# Patient Record
Sex: Female | Born: 1960 | Race: White | Hispanic: No | Smoking: Current some day smoker
Health system: Southern US, Community
[De-identification: ages and names within clinical notes are randomized; demographics above are authoritative.]

## PROBLEM LIST (undated history)

## (undated) DIAGNOSIS — K7290 Hepatic failure, unspecified without coma: Secondary | ICD-10-CM

## (undated) DIAGNOSIS — T7840XA Allergy, unspecified, initial encounter: Secondary | ICD-10-CM

## (undated) DIAGNOSIS — K7030 Alcoholic cirrhosis of liver without ascites: Secondary | ICD-10-CM

## (undated) DIAGNOSIS — J449 Chronic obstructive pulmonary disease, unspecified: Secondary | ICD-10-CM

## (undated) DIAGNOSIS — K760 Fatty (change of) liver, not elsewhere classified: Secondary | ICD-10-CM

## (undated) DIAGNOSIS — C801 Malignant (primary) neoplasm, unspecified: Secondary | ICD-10-CM

## (undated) DIAGNOSIS — R002 Palpitations: Secondary | ICD-10-CM

## (undated) DIAGNOSIS — T50905A Adverse effect of unspecified drugs, medicaments and biological substances, initial encounter: Secondary | ICD-10-CM

## (undated) DIAGNOSIS — K766 Portal hypertension: Secondary | ICD-10-CM

## (undated) DIAGNOSIS — K2970 Gastritis, unspecified, without bleeding: Secondary | ICD-10-CM

## (undated) DIAGNOSIS — E669 Obesity, unspecified: Secondary | ICD-10-CM

## (undated) DIAGNOSIS — I8500 Esophageal varices without bleeding: Secondary | ICD-10-CM

## (undated) DIAGNOSIS — K219 Gastro-esophageal reflux disease without esophagitis: Secondary | ICD-10-CM

## (undated) DIAGNOSIS — I10 Essential (primary) hypertension: Secondary | ICD-10-CM

## (undated) DIAGNOSIS — M1990 Unspecified osteoarthritis, unspecified site: Secondary | ICD-10-CM

## (undated) DIAGNOSIS — D126 Benign neoplasm of colon, unspecified: Secondary | ICD-10-CM

## (undated) DIAGNOSIS — R161 Splenomegaly, not elsewhere classified: Secondary | ICD-10-CM

## (undated) DIAGNOSIS — F32A Depression, unspecified: Secondary | ICD-10-CM

## (undated) DIAGNOSIS — R441 Visual hallucinations: Secondary | ICD-10-CM

## (undated) DIAGNOSIS — F419 Anxiety disorder, unspecified: Secondary | ICD-10-CM

## (undated) DIAGNOSIS — R0602 Shortness of breath: Secondary | ICD-10-CM

## (undated) DIAGNOSIS — F329 Major depressive disorder, single episode, unspecified: Secondary | ICD-10-CM

## (undated) DIAGNOSIS — B1920 Unspecified viral hepatitis C without hepatic coma: Secondary | ICD-10-CM

## (undated) HISTORY — DX: Gastro-esophageal reflux disease without esophagitis: K21.9

## (undated) HISTORY — DX: Alcoholic cirrhosis of liver without ascites: K70.30

## (undated) HISTORY — DX: Portal hypertension: K76.6

## (undated) HISTORY — DX: Obesity, unspecified: E66.9

## (undated) HISTORY — DX: Gastritis, unspecified, without bleeding: K29.70

## (undated) HISTORY — DX: Allergy, unspecified, initial encounter: T78.40XA

## (undated) HISTORY — DX: Fatty (change of) liver, not elsewhere classified: K76.0

## (undated) HISTORY — DX: Esophageal varices without bleeding: I85.00

## (undated) HISTORY — DX: Benign neoplasm of colon, unspecified: D12.6

## (undated) HISTORY — DX: Unspecified osteoarthritis, unspecified site: M19.90

## (undated) HISTORY — DX: Splenomegaly, not elsewhere classified: R16.1

## (undated) HISTORY — PX: OTHER SURGICAL HISTORY: SHX169

## (undated) HISTORY — DX: Essential (primary) hypertension: I10

---

## 1980-10-28 HISTORY — PX: TUBAL LIGATION: SHX77

## 1997-10-28 DIAGNOSIS — C801 Malignant (primary) neoplasm, unspecified: Secondary | ICD-10-CM

## 1997-10-28 HISTORY — DX: Malignant (primary) neoplasm, unspecified: C80.1

## 1997-10-28 HISTORY — PX: ABDOMINAL HYSTERECTOMY: SHX81

## 1999-05-08 ENCOUNTER — Encounter: Payer: Self-pay | Admitting: Family Medicine

## 1999-05-08 ENCOUNTER — Encounter (INDEPENDENT_AMBULATORY_CARE_PROVIDER_SITE_OTHER): Payer: Self-pay | Admitting: *Deleted

## 1999-05-08 ENCOUNTER — Ambulatory Visit (HOSPITAL_COMMUNITY): Admission: RE | Admit: 1999-05-08 | Discharge: 1999-05-08 | Payer: Self-pay | Admitting: Family Medicine

## 1999-05-10 ENCOUNTER — Other Ambulatory Visit: Admission: RE | Admit: 1999-05-10 | Discharge: 1999-05-10 | Payer: Self-pay | Admitting: Obstetrics & Gynecology

## 1999-05-10 ENCOUNTER — Encounter: Admission: RE | Admit: 1999-05-10 | Discharge: 1999-05-10 | Payer: Self-pay | Admitting: Obstetrics

## 1999-06-21 ENCOUNTER — Encounter: Admission: RE | Admit: 1999-06-21 | Discharge: 1999-06-21 | Payer: Self-pay | Admitting: Obstetrics

## 1999-08-13 ENCOUNTER — Ambulatory Visit (HOSPITAL_COMMUNITY): Admission: RE | Admit: 1999-08-13 | Discharge: 1999-08-13 | Payer: Self-pay | Admitting: Obstetrics

## 1999-08-13 ENCOUNTER — Encounter (INDEPENDENT_AMBULATORY_CARE_PROVIDER_SITE_OTHER): Payer: Self-pay

## 1999-08-30 ENCOUNTER — Encounter: Admission: RE | Admit: 1999-08-30 | Discharge: 1999-08-30 | Payer: Self-pay | Admitting: Obstetrics

## 1999-09-11 ENCOUNTER — Ambulatory Visit: Admission: RE | Admit: 1999-09-11 | Discharge: 1999-09-11 | Payer: Self-pay | Admitting: Gynecology

## 1999-09-27 ENCOUNTER — Encounter: Payer: Self-pay | Admitting: Gynecology

## 1999-10-02 ENCOUNTER — Encounter (INDEPENDENT_AMBULATORY_CARE_PROVIDER_SITE_OTHER): Payer: Self-pay | Admitting: Specialist

## 1999-10-02 ENCOUNTER — Inpatient Hospital Stay (HOSPITAL_COMMUNITY): Admission: RE | Admit: 1999-10-02 | Discharge: 1999-10-09 | Payer: Self-pay | Admitting: Gynecology

## 1999-10-30 ENCOUNTER — Ambulatory Visit: Admission: RE | Admit: 1999-10-30 | Discharge: 1999-10-30 | Payer: Self-pay | Admitting: Gynecology

## 1999-11-21 ENCOUNTER — Ambulatory Visit: Admission: RE | Admit: 1999-11-21 | Discharge: 1999-11-21 | Payer: Self-pay | Admitting: Gynecology

## 2000-02-12 ENCOUNTER — Ambulatory Visit: Admission: RE | Admit: 2000-02-12 | Discharge: 2000-02-12 | Payer: Self-pay | Admitting: Gynecologic Oncology

## 2000-02-12 ENCOUNTER — Other Ambulatory Visit: Admission: RE | Admit: 2000-02-12 | Discharge: 2000-02-12 | Payer: Self-pay | Admitting: Gynecologic Oncology

## 2000-03-18 ENCOUNTER — Ambulatory Visit: Admission: RE | Admit: 2000-03-18 | Discharge: 2000-03-18 | Payer: Self-pay | Admitting: Gynecology

## 2000-07-02 ENCOUNTER — Ambulatory Visit: Admission: RE | Admit: 2000-07-02 | Discharge: 2000-07-02 | Payer: Self-pay | Admitting: Gynecologic Oncology

## 2000-07-03 ENCOUNTER — Other Ambulatory Visit: Admission: RE | Admit: 2000-07-03 | Discharge: 2000-07-03 | Payer: Self-pay | Admitting: Gynecologic Oncology

## 2000-10-07 ENCOUNTER — Other Ambulatory Visit: Admission: RE | Admit: 2000-10-07 | Discharge: 2000-10-07 | Payer: Self-pay | Admitting: Gynecology

## 2000-10-07 ENCOUNTER — Ambulatory Visit: Admission: RE | Admit: 2000-10-07 | Discharge: 2000-10-07 | Payer: Self-pay | Admitting: Gynecology

## 2001-02-03 ENCOUNTER — Encounter: Payer: Self-pay | Admitting: Emergency Medicine

## 2001-02-03 ENCOUNTER — Emergency Department (HOSPITAL_COMMUNITY): Admission: EM | Admit: 2001-02-03 | Discharge: 2001-02-03 | Payer: Self-pay | Admitting: Emergency Medicine

## 2001-08-12 ENCOUNTER — Other Ambulatory Visit: Admission: RE | Admit: 2001-08-12 | Discharge: 2001-08-12 | Payer: Self-pay | Admitting: Gynecologic Oncology

## 2001-08-12 ENCOUNTER — Ambulatory Visit: Admission: RE | Admit: 2001-08-12 | Discharge: 2001-08-12 | Payer: Self-pay | Admitting: Gynecologic Oncology

## 2001-08-12 ENCOUNTER — Encounter (INDEPENDENT_AMBULATORY_CARE_PROVIDER_SITE_OTHER): Payer: Self-pay | Admitting: Specialist

## 2001-10-30 ENCOUNTER — Encounter: Payer: Self-pay | Admitting: Emergency Medicine

## 2001-10-30 ENCOUNTER — Emergency Department (HOSPITAL_COMMUNITY): Admission: EM | Admit: 2001-10-30 | Discharge: 2001-10-30 | Payer: Self-pay | Admitting: *Deleted

## 2001-10-31 ENCOUNTER — Encounter: Payer: Self-pay | Admitting: Emergency Medicine

## 2002-09-08 ENCOUNTER — Ambulatory Visit (HOSPITAL_COMMUNITY): Admission: RE | Admit: 2002-09-08 | Discharge: 2002-09-08 | Payer: Self-pay | Admitting: Family Medicine

## 2002-09-08 ENCOUNTER — Encounter: Payer: Self-pay | Admitting: Family Medicine

## 2002-09-17 ENCOUNTER — Emergency Department (HOSPITAL_COMMUNITY): Admission: EM | Admit: 2002-09-17 | Discharge: 2002-09-17 | Payer: Self-pay | Admitting: Emergency Medicine

## 2002-11-20 ENCOUNTER — Encounter: Payer: Self-pay | Admitting: *Deleted

## 2002-11-20 ENCOUNTER — Emergency Department (HOSPITAL_COMMUNITY): Admission: EM | Admit: 2002-11-20 | Discharge: 2002-11-20 | Payer: Self-pay | Admitting: Emergency Medicine

## 2002-12-10 ENCOUNTER — Emergency Department (HOSPITAL_COMMUNITY): Admission: EM | Admit: 2002-12-10 | Discharge: 2002-12-10 | Payer: Self-pay

## 2003-01-26 ENCOUNTER — Emergency Department (HOSPITAL_COMMUNITY): Admission: EM | Admit: 2003-01-26 | Discharge: 2003-01-26 | Payer: Self-pay | Admitting: Emergency Medicine

## 2003-01-26 ENCOUNTER — Encounter: Payer: Self-pay | Admitting: Emergency Medicine

## 2003-06-05 ENCOUNTER — Emergency Department (HOSPITAL_COMMUNITY): Admission: EM | Admit: 2003-06-05 | Discharge: 2003-06-05 | Payer: Self-pay | Admitting: Emergency Medicine

## 2003-06-17 ENCOUNTER — Encounter (INDEPENDENT_AMBULATORY_CARE_PROVIDER_SITE_OTHER): Payer: Self-pay | Admitting: *Deleted

## 2003-06-17 ENCOUNTER — Encounter: Admission: RE | Admit: 2003-06-17 | Discharge: 2003-06-17 | Payer: Self-pay | Admitting: Family Medicine

## 2004-06-19 ENCOUNTER — Encounter: Admission: RE | Admit: 2004-06-19 | Discharge: 2004-06-19 | Payer: Self-pay | Admitting: Obstetrics and Gynecology

## 2004-06-19 ENCOUNTER — Encounter (INDEPENDENT_AMBULATORY_CARE_PROVIDER_SITE_OTHER): Payer: Self-pay | Admitting: *Deleted

## 2004-08-15 ENCOUNTER — Emergency Department (HOSPITAL_COMMUNITY): Admission: EM | Admit: 2004-08-15 | Discharge: 2004-08-16 | Payer: Self-pay | Admitting: Emergency Medicine

## 2004-11-06 ENCOUNTER — Ambulatory Visit (HOSPITAL_COMMUNITY): Admission: RE | Admit: 2004-11-06 | Discharge: 2004-11-06 | Payer: Self-pay | Admitting: *Deleted

## 2004-11-15 ENCOUNTER — Encounter: Admission: RE | Admit: 2004-11-15 | Discharge: 2004-11-15 | Payer: Self-pay | Admitting: Family Medicine

## 2005-04-24 ENCOUNTER — Encounter: Admission: RE | Admit: 2005-04-24 | Discharge: 2005-04-24 | Payer: Self-pay | Admitting: Family Medicine

## 2006-04-02 ENCOUNTER — Inpatient Hospital Stay (HOSPITAL_COMMUNITY): Admission: AD | Admit: 2006-04-02 | Discharge: 2006-04-02 | Payer: Self-pay | Admitting: Gynecology

## 2006-08-01 ENCOUNTER — Encounter (INDEPENDENT_AMBULATORY_CARE_PROVIDER_SITE_OTHER): Payer: Self-pay | Admitting: *Deleted

## 2006-08-01 ENCOUNTER — Ambulatory Visit: Payer: Self-pay | Admitting: Gynecology

## 2006-09-04 ENCOUNTER — Emergency Department (HOSPITAL_COMMUNITY): Admission: EM | Admit: 2006-09-04 | Discharge: 2006-09-04 | Payer: Self-pay | Admitting: Family Medicine

## 2006-10-31 ENCOUNTER — Ambulatory Visit: Payer: Self-pay | Admitting: Internal Medicine

## 2007-07-10 ENCOUNTER — Emergency Department (HOSPITAL_COMMUNITY): Admission: EM | Admit: 2007-07-10 | Discharge: 2007-07-10 | Payer: Self-pay | Admitting: Family Medicine

## 2007-07-13 ENCOUNTER — Ambulatory Visit: Payer: Self-pay | Admitting: Internal Medicine

## 2007-07-13 ENCOUNTER — Ambulatory Visit (HOSPITAL_COMMUNITY): Admission: RE | Admit: 2007-07-13 | Discharge: 2007-07-13 | Payer: Self-pay | Admitting: Internal Medicine

## 2007-07-27 DIAGNOSIS — IMO0002 Reserved for concepts with insufficient information to code with codable children: Secondary | ICD-10-CM

## 2007-07-27 DIAGNOSIS — F17200 Nicotine dependence, unspecified, uncomplicated: Secondary | ICD-10-CM

## 2007-08-03 ENCOUNTER — Ambulatory Visit: Payer: Self-pay | Admitting: Internal Medicine

## 2007-11-25 ENCOUNTER — Ambulatory Visit: Payer: Self-pay | Admitting: *Deleted

## 2007-12-17 ENCOUNTER — Ambulatory Visit: Payer: Self-pay | Admitting: Internal Medicine

## 2008-01-11 ENCOUNTER — Emergency Department (HOSPITAL_COMMUNITY): Admission: EM | Admit: 2008-01-11 | Discharge: 2008-01-11 | Payer: Self-pay | Admitting: Emergency Medicine

## 2008-01-13 ENCOUNTER — Ambulatory Visit: Payer: Self-pay | Admitting: Internal Medicine

## 2008-01-13 LAB — CONVERTED CEMR LAB
ALT: 105 units/L — ABNORMAL HIGH (ref 0–35)
Albumin: 4.3 g/dL (ref 3.5–5.2)
Alkaline Phosphatase: 88 units/L (ref 39–117)
BUN: 9 mg/dL (ref 6–23)
CO2: 22 meq/L (ref 19–32)
Chloride: 104 meq/L (ref 96–112)
Glucose, Bld: 118 mg/dL — ABNORMAL HIGH (ref 70–99)
Sodium: 140 meq/L (ref 135–145)
Total Bilirubin: 1.3 mg/dL — ABNORMAL HIGH (ref 0.3–1.2)
Total Protein: 7 g/dL (ref 6.0–8.3)

## 2008-01-20 ENCOUNTER — Ambulatory Visit: Payer: Self-pay | Admitting: Internal Medicine

## 2008-01-20 LAB — CONVERTED CEMR LAB
ALT: 68 units/L — ABNORMAL HIGH (ref 0–35)
AST: 49 units/L — ABNORMAL HIGH (ref 0–37)
BUN: 11 mg/dL (ref 6–23)
CO2: 25 meq/L (ref 19–32)
Creatinine, Ser: 0.6 mg/dL (ref 0.40–1.20)
HCV Ab: POSITIVE — AB
Hep A Total Ab: NEGATIVE
Hep B S Ab: NEGATIVE

## 2008-02-11 ENCOUNTER — Ambulatory Visit (HOSPITAL_COMMUNITY): Admission: RE | Admit: 2008-02-11 | Discharge: 2008-02-11 | Payer: Self-pay | Admitting: Internal Medicine

## 2008-02-17 ENCOUNTER — Ambulatory Visit: Payer: Self-pay | Admitting: Internal Medicine

## 2008-03-25 ENCOUNTER — Ambulatory Visit: Payer: Self-pay | Admitting: Internal Medicine

## 2008-05-13 ENCOUNTER — Encounter (INDEPENDENT_AMBULATORY_CARE_PROVIDER_SITE_OTHER): Payer: Self-pay | Admitting: Interventional Radiology

## 2008-05-13 ENCOUNTER — Ambulatory Visit (HOSPITAL_COMMUNITY): Admission: RE | Admit: 2008-05-13 | Discharge: 2008-05-13 | Payer: Self-pay | Admitting: Internal Medicine

## 2008-08-04 ENCOUNTER — Ambulatory Visit: Payer: Self-pay | Admitting: Internal Medicine

## 2008-09-27 ENCOUNTER — Ambulatory Visit: Payer: Self-pay | Admitting: Internal Medicine

## 2008-09-27 LAB — CONVERTED CEMR LAB
ALT: 148 units/L — ABNORMAL HIGH (ref 0–35)
AST: 224 units/L — ABNORMAL HIGH (ref 0–37)
Albumin: 4.1 g/dL (ref 3.5–5.2)
Alkaline Phosphatase: 165 units/L — ABNORMAL HIGH (ref 39–117)
BUN: 7 mg/dL (ref 6–23)
Basophils Relative: 1 % (ref 0–1)
Calcium: 9.3 mg/dL (ref 8.4–10.5)
Eosinophils Absolute: 0.1 10*3/uL (ref 0.0–0.7)
Eosinophils Relative: 2 % (ref 0–5)
Lymphocytes Relative: 30 % (ref 12–46)
Lymphs Abs: 1.8 10*3/uL (ref 0.7–4.0)
MCHC: 32.5 g/dL (ref 30.0–36.0)
Monocytes Absolute: 0.5 10*3/uL (ref 0.1–1.0)
Monocytes Relative: 9 % (ref 3–12)
Neutro Abs: 3.6 10*3/uL (ref 1.7–7.7)
Neutrophils Relative %: 59 % (ref 43–77)
Sodium: 138 meq/L (ref 135–145)
Total Protein: 7.1 g/dL (ref 6.0–8.3)
WBC: 6.1 10*3/uL (ref 4.0–10.5)

## 2008-11-10 ENCOUNTER — Ambulatory Visit: Payer: Self-pay | Admitting: Internal Medicine

## 2008-11-10 LAB — CONVERTED CEMR LAB
Albumin: 4.1 g/dL (ref 3.5–5.2)
BUN: 9 mg/dL (ref 6–23)
CO2: 23 meq/L (ref 19–32)
Total Protein: 7.2 g/dL (ref 6.0–8.3)

## 2009-01-11 ENCOUNTER — Ambulatory Visit: Payer: Self-pay | Admitting: Internal Medicine

## 2009-02-02 ENCOUNTER — Ambulatory Visit: Payer: Self-pay | Admitting: Gastroenterology

## 2009-03-16 ENCOUNTER — Ambulatory Visit: Payer: Self-pay | Admitting: Gastroenterology

## 2009-03-21 ENCOUNTER — Ambulatory Visit (HOSPITAL_COMMUNITY): Admission: RE | Admit: 2009-03-21 | Discharge: 2009-03-21 | Payer: Self-pay | Admitting: Gastroenterology

## 2009-04-26 ENCOUNTER — Encounter: Payer: Self-pay | Admitting: Internal Medicine

## 2009-04-26 ENCOUNTER — Ambulatory Visit (HOSPITAL_COMMUNITY): Admission: RE | Admit: 2009-04-26 | Discharge: 2009-04-26 | Payer: Self-pay | Admitting: Gastroenterology

## 2009-04-29 ENCOUNTER — Encounter: Payer: Self-pay | Admitting: Internal Medicine

## 2009-05-09 ENCOUNTER — Ambulatory Visit: Payer: Self-pay | Admitting: Gastroenterology

## 2009-08-16 ENCOUNTER — Ambulatory Visit: Payer: Self-pay | Admitting: Internal Medicine

## 2009-09-14 ENCOUNTER — Ambulatory Visit: Payer: Self-pay | Admitting: Internal Medicine

## 2009-09-20 ENCOUNTER — Emergency Department (HOSPITAL_BASED_OUTPATIENT_CLINIC_OR_DEPARTMENT_OTHER): Admission: EM | Admit: 2009-09-20 | Discharge: 2009-09-20 | Payer: Self-pay | Admitting: Emergency Medicine

## 2009-09-30 ENCOUNTER — Emergency Department (HOSPITAL_COMMUNITY): Admission: EM | Admit: 2009-09-30 | Discharge: 2009-09-30 | Payer: Self-pay | Admitting: Emergency Medicine

## 2009-11-02 ENCOUNTER — Telehealth (INDEPENDENT_AMBULATORY_CARE_PROVIDER_SITE_OTHER): Payer: Self-pay | Admitting: *Deleted

## 2009-11-30 ENCOUNTER — Telehealth (INDEPENDENT_AMBULATORY_CARE_PROVIDER_SITE_OTHER): Payer: Self-pay | Admitting: *Deleted

## 2009-12-28 ENCOUNTER — Ambulatory Visit: Payer: Self-pay | Admitting: Internal Medicine

## 2010-01-28 ENCOUNTER — Emergency Department (HOSPITAL_COMMUNITY): Admission: EM | Admit: 2010-01-28 | Discharge: 2010-01-28 | Payer: Self-pay | Admitting: Emergency Medicine

## 2010-03-22 ENCOUNTER — Ambulatory Visit: Payer: Self-pay | Admitting: Internal Medicine

## 2010-03-22 LAB — CONVERTED CEMR LAB
Alkaline Phosphatase: 125 units/L — ABNORMAL HIGH (ref 39–117)
BUN: 7 mg/dL (ref 6–23)
Basophils Absolute: 0 10*3/uL (ref 0.0–0.1)
Creatinine, Ser: 0.55 mg/dL (ref 0.40–1.20)
Eosinophils Relative: 3 % (ref 0–5)
HCT: 42.1 % (ref 36.0–46.0)
Lymphocytes Relative: 40 % (ref 12–46)
Lymphs Abs: 2.3 10*3/uL (ref 0.7–4.0)
MCHC: 31.6 g/dL (ref 30.0–36.0)
MCV: 115 fL — ABNORMAL HIGH (ref 78.0–100.0)
Monocytes Relative: 12 % (ref 3–12)
Neutrophils Relative %: 45 % (ref 43–77)
RBC: 3.66 M/uL — ABNORMAL LOW (ref 3.87–5.11)
Sodium: 139 meq/L (ref 135–145)
Total Protein: 7.5 g/dL (ref 6.0–8.3)

## 2010-05-14 ENCOUNTER — Ambulatory Visit: Payer: Self-pay | Admitting: Internal Medicine

## 2010-05-14 LAB — CONVERTED CEMR LAB
AST: 187 units/L — ABNORMAL HIGH (ref 0–37)
Albumin: 3.8 g/dL (ref 3.5–5.2)
Alkaline Phosphatase: 151 units/L — ABNORMAL HIGH (ref 39–117)
Basophils Absolute: 0 10*3/uL (ref 0.0–0.1)
Basophils Relative: 1 % (ref 0–1)
Benzodiazepines.: NEGATIVE
Calcium: 8.7 mg/dL (ref 8.4–10.5)
Chloride: 103 meq/L (ref 96–112)
Eosinophils Absolute: 0.1 10*3/uL (ref 0.0–0.7)
HDL: 16 mg/dL — ABNORMAL LOW (ref >39)
LDL Cholesterol: 49 mg/dL (ref 0–99)
Lymphocytes Relative: 41 % (ref 12–46)
Lymphs Abs: 2.9 10*3/uL (ref 0.7–4.0)
MCV: 106.8 fL — ABNORMAL HIGH (ref 78.0–100.0)
Marijuana Metabolite: NEGATIVE
Monocytes Absolute: 0.8 10*3/uL (ref 0.1–1.0)
Neutrophils Relative %: 45 % (ref 43–77)
Opiate Screen, Urine: NEGATIVE
Phencyclidine (PCP): NEGATIVE
Platelets: 64 10*3/uL — ABNORMAL LOW (ref 150–400)
Potassium: 3.4 meq/L — ABNORMAL LOW (ref 3.5–5.3)
RBC: 3.52 M/uL — ABNORMAL LOW (ref 3.87–5.11)
Sodium: 137 meq/L (ref 135–145)
Total Bilirubin: 4.5 mg/dL — ABNORMAL HIGH (ref 0.3–1.2)
Triglycerides: 116 mg/dL (ref <150)
VLDL: 23 mg/dL (ref 0–40)
WBC: 7 10*3/uL (ref 4.0–10.5)

## 2010-05-22 ENCOUNTER — Ambulatory Visit: Payer: Self-pay | Admitting: Internal Medicine

## 2010-11-18 ENCOUNTER — Encounter: Payer: Self-pay | Admitting: Internal Medicine

## 2010-11-18 ENCOUNTER — Encounter: Payer: Self-pay | Admitting: *Deleted

## 2010-11-27 NOTE — Progress Notes (Signed)
Summary: triage/leg pain  Phone Note Call from Patient   Reason for Call: Talk to Nurse Summary of Call: patient states she got up this morning and after coming back from the bathroom she got a sharp burning pain in her anterior leg shooting down from groin to knee.Marland KitchenMarland KitchenShe says the pain is constant and that she can not walk but hops around... 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 She states she can move her foot and toes and that there is not swelling. Advised patient that she needs to be seen..She is going to go to Urgent Care since we do not have any appointments this afternoon left.Marland KitchenMarland KitchenShe will call me in AM if needed. Initial call taken by: Conchita Paris,  November 02, 2009 3:25 PM

## 2010-11-27 NOTE — Progress Notes (Signed)
Summary: triage/toothache  Phone Note Call from Patient   Caller: Patient Reason for Call: Talk to Nurse Summary of Call: Patient states her wisdom tooth is hurting..States she has a cavity in it and has an appointment 12/04/09 with [facility].. She says she took a pain pill her friend gave her and that is helping right now.Marland KitchenMarland KitchenShe sounds groggy and states she only took one.Marland KitchenMarland KitchenI offered appointment this afternoon and she states she doesn't have a way.Marland KitchenMarland KitchenI offered appointment tomorrow am and she says she has a job interview...her neighbor is taking her..then she states she isn't going to it, but her neighbor can't bring her her here instead.Marland KitchenMarland KitchenI asked patient how she would like for me to help her.Marland KitchenMarland KitchenShe states she wants antibiotics.Marland Kitchenand says she has a lot of problems like bleeding from the rectum..I told her she needed to come in...she states she will call 911 if needed. Initial call taken by: Conchita Paris,  November 30, 2009 12:27 PM

## 2010-12-06 ENCOUNTER — Emergency Department (HOSPITAL_COMMUNITY): Payer: Self-pay

## 2010-12-06 ENCOUNTER — Emergency Department (HOSPITAL_COMMUNITY)
Admission: EM | Admit: 2010-12-06 | Discharge: 2010-12-06 | Disposition: A | Discharge status: Home / Self Care | Payer: Self-pay | Attending: Emergency Medicine | Admitting: Emergency Medicine

## 2010-12-06 DIAGNOSIS — R0789 Other chest pain: Secondary | ICD-10-CM | POA: Insufficient documentation

## 2010-12-06 DIAGNOSIS — J45909 Unspecified asthma, uncomplicated: Secondary | ICD-10-CM | POA: Insufficient documentation

## 2010-12-06 DIAGNOSIS — F411 Generalized anxiety disorder: Secondary | ICD-10-CM | POA: Insufficient documentation

## 2010-12-06 DIAGNOSIS — R002 Palpitations: Secondary | ICD-10-CM | POA: Insufficient documentation

## 2010-12-06 LAB — CBC
MCHC: 33.6 g/dL (ref 30.0–36.0)
MCV: 105.6 fL — ABNORMAL HIGH (ref 78.0–100.0)
Platelets: 66 10*3/uL — ABNORMAL LOW (ref 150–400)
RBC: 3.58 MIL/uL — ABNORMAL LOW (ref 3.87–5.11)
RDW: 15 % (ref 11.5–15.5)

## 2010-12-06 LAB — POCT CARDIAC MARKERS
CKMB, poc: <1 ng/mL — ABNORMAL LOW (ref 1.0–8.0)
Troponin i, poc: <0.05 ng/mL (ref 0.00–0.09)

## 2010-12-06 LAB — DIFFERENTIAL
Basophils Relative: 1 % (ref 0–1)
Eosinophils Absolute: 0 10*3/uL (ref 0.0–0.7)
Eosinophils Relative: 1 % (ref 0–5)
Monocytes Relative: 12 % (ref 3–12)
Neutrophils Relative %: 52 % (ref 43–77)

## 2010-12-06 LAB — BASIC METABOLIC PANEL
BUN: 4 mg/dL — ABNORMAL LOW (ref 6–23)
CO2: 22 mEq/L (ref 19–32)
Chloride: 106 mEq/L (ref 96–112)
GFR calc Af Amer: >60 mL/min (ref >60)
GFR calc non Af Amer: >60 mL/min (ref >60)
Glucose, Bld: 90 mg/dL (ref 70–99)

## 2011-01-16 LAB — COMPREHENSIVE METABOLIC PANEL
ALT: 75 U/L — ABNORMAL HIGH (ref 0–35)
AST: 119 U/L — ABNORMAL HIGH (ref 0–37)
BUN: 6 mg/dL (ref 6–23)
Calcium: 8.3 mg/dL — ABNORMAL LOW (ref 8.4–10.5)
GFR calc non Af Amer: >60 mL/min (ref >60)
Glucose, Bld: 102 mg/dL — ABNORMAL HIGH (ref 70–99)
Potassium: 3.5 mEq/L (ref 3.5–5.1)
Total Bilirubin: 1.1 mg/dL (ref 0.3–1.2)

## 2011-01-16 LAB — DIFFERENTIAL
Basophils Relative: 0 % (ref 0–1)
Lymphocytes Relative: 45 % (ref 12–46)
Neutro Abs: 3.6 10*3/uL (ref 1.7–7.7)
Neutrophils Relative %: 45 % (ref 43–77)

## 2011-01-16 LAB — CBC
HCT: 39.2 % (ref 36.0–46.0)
Hemoglobin: 13.4 g/dL (ref 12.0–15.0)
MCHC: 34.2 g/dL (ref 30.0–36.0)
MCV: 109.4 fL — ABNORMAL HIGH (ref 78.0–100.0)
Platelets: 94 10*3/uL — ABNORMAL LOW (ref 150–400)
WBC: 8.2 10*3/uL (ref 4.0–10.5)

## 2011-01-16 LAB — URINALYSIS, ROUTINE W REFLEX MICROSCOPIC
Bilirubin Urine: NEGATIVE
Hgb urine dipstick: NEGATIVE
Protein, ur: NEGATIVE mg/dL
Specific Gravity, Urine: 1.008 (ref 1.005–1.030)
pH: 5 (ref 5.0–8.0)

## 2011-02-09 ENCOUNTER — Emergency Department (HOSPITAL_BASED_OUTPATIENT_CLINIC_OR_DEPARTMENT_OTHER)
Admission: EM | Admit: 2011-02-09 | Discharge: 2011-02-10 | Disposition: A | Discharge status: Home / Self Care | Payer: Self-pay | Attending: Emergency Medicine | Admitting: Emergency Medicine

## 2011-02-09 ENCOUNTER — Emergency Department (INDEPENDENT_AMBULATORY_CARE_PROVIDER_SITE_OTHER): Payer: Self-pay

## 2011-02-09 DIAGNOSIS — Y92009 Unspecified place in unspecified non-institutional (private) residence as the place of occurrence of the external cause: Secondary | ICD-10-CM | POA: Insufficient documentation

## 2011-02-09 DIAGNOSIS — Y93E1 Activity, personal bathing and showering: Secondary | ICD-10-CM

## 2011-02-09 DIAGNOSIS — S2000XA Contusion of breast, unspecified breast, initial encounter: Secondary | ICD-10-CM | POA: Insufficient documentation

## 2011-02-09 DIAGNOSIS — R0602 Shortness of breath: Secondary | ICD-10-CM

## 2011-02-09 DIAGNOSIS — W010XXA Fall on same level from slipping, tripping and stumbling without subsequent striking against object, initial encounter: Secondary | ICD-10-CM

## 2011-02-09 DIAGNOSIS — R079 Chest pain, unspecified: Secondary | ICD-10-CM

## 2011-03-15 NOTE — Group Therapy Note (Signed)
NAME:  [name], [name]                           ACCOUNT NO.:  [id number]   MEDICAL RECORD NO.:  [id number]                   PATIENT TYPE:  OUT   LOCATION[facility]  [facility]                           FACILITY:  [facility]   PHYSICIAN:  Argentina Donovan, MD                     DATE OF BIRTH:  1961-06-15   DATE OF SERVICE:  06/19/2004                                    CLINIC NOTE   REASON FOR VISIT:  The patient is a 50 year old white female who underwent  radical hysterectomy for cervical cancer in 2000, has not had follow-up  since.  She has been very well.  She does smoke a pack of cigarettes a day  and she has normal blood pressure 110/73, weight is 145, and her height is 5  feet 4 inches.  She has been complaining, she says, for the past 3 months of  epigastric pain that starts just in the mid epigastrium and seems to go up  into her chest.  It is worse after heavy eating and with fatty foods.  Her  sister has a history of gallstones.   PHYSICAL EXAMINATION:  BREASTS:  Symmetrical with no dominant masses.  ABDOMEN:  Soft, flat, nontender.  No masses, no organomegaly.  PELVIC:  External genitalia is normal.  BUS within normal limits.  The  vagina is clean, somewhat atrophic.  The apex is clean and Pap smear was  taken.  Bimanual pelvic examination failed to reveal any masses.  The  ovaries could not be well palpated.  RECTAL:  Normal.   PLAN:  Mammogram, ultrasound of the gallbladder, and an H. pylori blood  test.  Also, I am going to order Zantac for the patient to try and see if  that will relieve some of her pain.  If it does not, when we get the reports  back we will refer her to a gastroenterologist unless the gallbladder shows  stones, at which time we will refer her to a general surgeon.   IMPRESSION:  1. Pap smear done in follow-up for carcinoma of the cervix.  2. The patient with epigastric pain, etiology unknown.                                               Argentina Donovan, MD    PR/MEDQ  D:  06/19/2004  T:  06/19/2004  Job:  [id number]

## 2011-03-15 NOTE — Consult Note (Signed)
[facility]  Patient:    [name] [name]                   MRN: [id number] Proc. Date: 03/18/00 Adm. Date:  [id number] Disc. Date: [id number] Attending:  Haze Boyden CC:         [name] [name], M.D.             Telford Nab, R.N.                          Consultation Report  REASON FOR CONSULTATION:  A 50 year old white female returns for continuing follow-up of cervical carcinoma. She underwent a radical hysterectomy on October 02, 1999 for treatment of a stage 1B squamous cell carcinoma. She had negative lymph nodes, negative margins, and 6 mm of invasion. She has had some difficulty since surgery with bladder dysfunction in that she has been unable to completely empty her bladder. This has been complicated by the fact that the patient has been drinking alcohol excessively on some occasions. She had her suprapubic catheter removed and we had scheduled her to return to learn to how to perform intermittent self catheterization but she failed to keep that appointment and she returns today. She reports that she is voiding frequently sometimes 2 to 3 times at night and several times during the day. She notes that she does have sensation the sensation that she needs to void but occasionally does recognize that she is unable to completely empty her bladder.  The patient has no other GI symptoms. She denies any pelvic pain, pressure, vaginal bleeding or discharge. She does have some discomfort to the left side of her transverse incision. Otherwise she has been doing well.  FAMILY/SOCIAL HISTORY:  Reviewed. 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  REVIEW OF SYSTEMS:  Revealed no cardiovascular, pulmonary or renal symptoms. She has no neurologic symptoms either.  PHYSICAL EXAMINATION:  VITAL SIGNS:  Weight 164 pounds, blood pressure 125/70.  GENERAL:  The patient is a healthy white female in  no acute distress.  HEENT:  Negative.  NECK:  Supple without thyromegaly. There is no supraclavicular or inguinal adenopathy.  ABDOMEN:  Soft and nontender. The transverse incision is well healed. No hernias are noted. There are no masses, organomegaly, or ascites noted.  There is some discomfort to the left side of her incision. There is no CVA tenderness.  EXTREMITIES:  The lower extremity is without edema or varicosities.  PELVIC:  EGBUS is normal. The vagina is clean. No lesion is noted. Bimanual and rectovaginal exam revealed no masses, indurations or nodularity.  The patient voided 125 cc and then underwent sterile catheterization with a 70 cc residual.  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  Persistent bladder dysfunction following a radical hysterectomy. She is not ready to void totally on her own and therefore she is taught intermittent self catheterization today. She will perform self catheterization twice a day in order to keep her bladder from becoming over distended and to minimize the risk of infection. She was taught this by Ms. Aundria Rud and the patient demonstrated full understanding and demonstrated her ability to do this herself. Will plan on seeing her back again in 3 months for continuing follow-up or p.r.n. DD:  03/18/00 TD:  03/18/00 Job: [id number]

## 2011-03-15 NOTE — Consult Note (Signed)
[facility]  Patient:    [name] [name], [name] [name]                   MRN: [id number] Proc. Date: 10/07/00 Adm. Date:  [id number] Disc. Date: [id number] Attending:  Ronita Hipps T CC:         Telford Nab, R.N.  Roseanna Rainbow, M.D.   Consultation Report  HISTORY:  [age]-old white female returns for continuing followup of cervical cancer.  She underwent a radical hysterectomy in December 2000.  She has been followed since that time with no evidence of recurrent disease. Currently, the patient denies any pelvic pain or pressure, vaginal bleeding or discharge.  Her bladder function is excellent.  Her functional status is also excellent.  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  She has no GI or GU symptoms.  REVIEW OF SYSTEMS:  Patient has no cardiovascular, pulmonary, neurologic, GI or GU symptoms.  She has no evidence of lymphedema or neuropathy.  FAMILY HISTORY AND SOCIAL HISTORY:  Reviewed and unchanged.  ALLERGIES:  None.  PHYSICAL EXAMINATION  VITAL SIGNS:  Weight 159 pounds.  ABDOMEN:  Soft, nontender.  No mass, organomegaly, ascites or herniae are noted and her transverse incision is well-healed.  PELVIC:  EG/BUS normal.  Vagina is clean and well-supported.  Bimanual and rectovaginal exam reveal no mass, induration or nodularity.  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  PLAN:  Patient will return to see [place] in six months.  Pap smears are repeated today. DD:  10/07/00 TD:  10/07/00 Job: [id number]

## 2011-03-15 NOTE — Consult Note (Signed)
[facility]  Patient:    [name] [name], [name] [name]                   MRN: [id number] Adm. Date:  [id number] Disc. Date: [id number] Attending:  Jeannette Corpus CC:         Telford Nab, R.N., GYN Oncology Unit, [facility]  Roseanna Rainbow, M.D.   Consultation Report  CHIEF COMPLAINT:  This 50 year old woman returns for ongoing followup of cervical carcinoma.  INTERVAL NOTE:  Since she was last seen in May, the patient has done relatively well, having discontinued intermittent catheterization until recently.  Over the past two weeks, she has noted symptoms of urgency, frequency and bladder tenderness.  Her postvoid residuals have varied, usually around 70 to 75 cc, but occasionally above 100 cc.  She also notes upper respiratory symptoms of cough and congestion, with a fever to 100.2 degrees. She denies chills, flank pain or back pain.  She denies pelvic pain or vaginal bleeding.  She does have some tenderness and numbness associated with the left side of her transverse abdominal incision.  She denies lymphedema.  HISTORY OF PRESENT ILLNESS:  The patient underwent radical hysterectomy in December 2000 for stage IB squamous carcinoma of the cervix.  She had negative nodes, margins and 6 mm of invasion.  Followup since has been NED.  PAST MEDICAL HISTORY:  Significant for ethanol abuse.  She is post bilateral tubal ligation in 1982 and radical hysterectomy, as above.  MEDICATIONS:  None.  ALLERGIES:  None known.  PERSONAL/SOCIAL HISTORY:  Patient has a 25+ pack-year history of tobacco use and prior ethanol use.  FAMILY HISTORY:  No gynecologic, breast or colon malignancy.  REVIEW OF SYSTEMS:  Other than above, negative.  EXAMINATION  VITAL SIGNS:  Weight 153 pounds (voluntary weight loss).  Blood pressure 120/80.  Temperature:  Afebrile.  Vital signs stable.  GENERAL:  The patient is alert and oriented x 3, in no acute  distress.  ENT:  Benign with clear oropharynx.  NECK:  Supple without goiter.  There is no pathologic lymphadenopathy.  LUNGS:  Lung fields are clear.  BACK:  There is no back or CVA tenderness.  ABDOMEN:  Soft and benign, with well-healed transverse incision without hernia, ascites, mass.  There is minimal tenderness at the left angle of the transverse incision.  EXTREMITIES:  Full range of motion and strength without edema.  PELVIC/RECTAL:  External genitalia and BUS are normal to inspection and palpation.  Bladder and urethra are well-supported and there are no vaginal mucosal lesions.  Cervix and uterus are absent.  Bimanual and rectovaginal examinations reveal no mass or nodularity and stool is guaiac negative.  GU:  The patient was able to void spontaneously 100 cc of slightly cloudy urine, sent for urinalysis and C&S.  ASSESSMENT 1. Cervical carcinoma, no evidence of disease. 2. Presumed urinary tract infection. 3. Upper respiratory infection.  PLAN:  The patient was again encouraged to discontinue tobacco use.  She was given a prescription for Macrobid 100 mg b.i.d. x 5 days pending results of culture.  She will complete one year of followup in December and then can be followed at six-month intervals. DD:  07/02/00 TD:  07/03/00 Job: [id number]

## 2011-03-15 NOTE — Consult Note (Signed)
[facility]  Patient:    [name] [name], [name] [name] Visit Number: [id number] MRN: [id number]          Service Type: GON Location: GYN Attending Physician:  Sabino Donovan Dictated by:   Rande Brunt. Clarke-Pearson, M.D. Proc. Date: 08/12/01 Admit Date:  08/12/2001   CC:         Telford Nab, R.N.                          Consultation Report  REASON FOR CONSULTATION:  [age]-year-old white female returns for continuing followup of cervical cancer.  She underwent a radical hysterectomy in December of 2000.  Since her last visit, she has done well.  She has had some urinary tract symptoms over the last three weeks with some hematuria on occasion and dysuria.  She denies any fever, chills or flank pain.  She does occasionally do intermittent self-catheterization when she feels that her bladder does not empty completely.  She denies any GI symptoms.  Her functional status is excellent.  It is noted that patient has failed to show for a number of scheduled visits and has been sent certified letters.  REVIEW OF SYSTEMS:  No cardiovascular, neurologic, pulmonary, GI symptoms. She has no lymphedema or neuropathy.  FAMILY HISTORY AND SOCIAL HISTORY:  Reviewed and unchanged.  The patient does work full-time.  DRUG ALLERGIES:  None.  PHYSICAL EXAMINATION:  VITAL SIGNS:  Weight 156 pounds.  Blood pressure 130/86.  GENERAL:  The patient is a healthy white female in no acute distress.  HEENT:  Negative.  NECK:  Supple without thyromegaly.  NODES:  There is no supraclavicular or inguinal adenopathy.  ABDOMEN:  Soft and nontender.  No masses, organomegaly, ascites or herniae are noted.  PELVIC:  EGBUS normal.  Vagina is clean, well-supported.  No lesions are noted.  Bimanual and rectovaginal exam reveal no masses, induration or nodularity.  EXTREMITIES:  Lower extremities without edema or varicosities.  IMPRESSION:  Stage IB cervical cancer, status post  radical hysterectomy in December of 2000, no evidence of recurrent disease.  PLAN:  Pap smears are obtained.  The patient will return to see [place] in six months for continuing followup.  It was emphasized again to the patient that scheduled appointments should be kept and that followup is in her best interest.  She agrees to return in six months to see [place]. Dictated by:   Rande Brunt. Clarke-Pearson, M.D. Attending Physician:  Ronita Hipps T DD:  08/12/01 TD:  08/13/01 Job: [id number]

## 2011-03-15 NOTE — Group Therapy Note (Signed)
NAME:  [name], [name]               ACCOUNT NO.:  [id number]   MEDICAL RECORD NO.:  [id number]          PATIENT TYPE:  WOC   LOCATION:  [facility]                   FACILITY:  [facility]   PHYSICIAN:  Ginger Carne, MD DATE OF BIRTH:  May 12, 1961   DATE OF SERVICE:  08/01/2006                                    CLINIC NOTE   This patient is a 50 year old multiparous female status post cervical cancer  in 2000, presenting with a 66-month history of intermittent mild left lower  quadrant pain.  Operative notes and pathology notes not available at the  time of this dictation.  However, the patient has had a hysterectomy.  The  patient states she is constipated from time to time and has had no vaginal  bleeding.  She denies lower back pain or right lower quadrant pain.   SALIENT PHYSICAL FINDINGS:  VITAL SIGNS:  Per office records.  ABDOMEN:  Soft without gross hepatosplenomegaly.  PELVIC:  Pap smear of vaginal cuff performed.  External genitalia, vulva and  vagina normal.  Both adnexa palpable, found to be normal, satisfactory  examination.   IMPRESSION:  1. Followup Pap smear for past history of cervical carcinoma, stage      unknown.  2. Unremarkable pelvic exam.   The patient was advised to contact the office if her pain continues over the  next several months and a pelvic sonogram will be ordered.  At this time,  with a satisfactory and normal pelvic exam, it is deemed prudent to sit  tight for the next month or and see if the pain resolves on its own.  The  patient is agreement and understands this information.           ______________________________  Ginger Carne, MD     SHB/MEDQ  D:  08/01/2006  T:  08/01/2006  Job:  [id number]

## 2011-03-15 NOTE — Group Therapy Note (Signed)
   NAME:  [name], [name]                           ACCOUNT NO.:   MEDICAL RECORD NO.:  [id number]                   PATIENT TYPE:   LOCATION:  [facility]                           FACILITY:   PHYSICIAN:  Tinnie Gens, MD                     DATE OF BIRTH:   DATE OF SERVICE:  06/17/2003                                    CLINIC NOTE   CHIEF COMPLAINT:  Follow-up Pap smear.   HISTORY OF PRESENT ILLNESS:  The patient is a 50 year old G3, P2, A1 who is  status post radical hysterectomy for cervical cancer stage IB.  This is the  patient's first visit with [place] in the last four years.  She has not been seen  for a Pap smear in the last two years but she denies any problems including  problems with vaginal bleeding or irritation.  She does note that she has  had a lot of difficulty with hormone loss and hot flashes and mood swings  and some minor problems with vaginal dryness.  She has not had her first  scheduled mammogram.   PAST MEDICAL HISTORY:  Reviewed and is negative.   MEDICATIONS:  None.   ALLERGIES:  She has no known allergies.   FAMILY HISTORY:  Significant for a maternal aunt with breast cancer.  Otherwise, her family history is negative.   SOCIAL HISTORY:  The patient works full-time.   PHYSICAL EXAMINATION:  VITAL SIGNS:  As noted with blood pressure 107/73.  GENERAL:  She is a thin white female in no acute distress.  NECK:  Supple.  She has normal thyroid.  There is no adenopathy.  There is  no supraclavicular adenopathy or axillary adenopathy.  BREASTS:  Examined and are benign with everted nipples.  There is no  definite mass, although some cystic structures are noted along the inferior  border of each breast.  ABDOMEN:  Soft and nontender.  PELVIC:  She has atrophic external genitalia as well as the vagina.  Is very  thin and almost white.  The vaginal cuff is examined and found to be without  lesion.  A Pap smear is obtained.   IMPRESSION:  IB cervical cancer  status post radical hysterectomy four years  ago.  No signs or symptoms of recurrence.   PLAN:  Pap smear today and follow up from there as needed.  The patient also  needs a mammogram.  One will be scheduled for her today.                                              Tinnie Gens, MD   TP/MEDQ  D:  06/17/2003  T:  06/17/2003  Job:  [id number]

## 2011-07-13 ENCOUNTER — Emergency Department (HOSPITAL_COMMUNITY): Payer: Self-pay

## 2011-07-13 ENCOUNTER — Emergency Department (HOSPITAL_COMMUNITY)
Admission: EM | Admit: 2011-07-13 | Discharge: 2011-07-13 | Disposition: A | Discharge status: Home / Self Care | Payer: Self-pay | Attending: Emergency Medicine | Admitting: Emergency Medicine

## 2011-07-13 ENCOUNTER — Emergency Department (HOSPITAL_COMMUNITY): Admission: EM | Admit: 2011-07-13 | Payer: Self-pay | Source: Home / Self Care

## 2011-07-13 DIAGNOSIS — M542 Cervicalgia: Secondary | ICD-10-CM | POA: Insufficient documentation

## 2011-07-13 DIAGNOSIS — Z8619 Personal history of other infectious and parasitic diseases: Secondary | ICD-10-CM | POA: Insufficient documentation

## 2011-07-13 DIAGNOSIS — F411 Generalized anxiety disorder: Secondary | ICD-10-CM | POA: Insufficient documentation

## 2011-07-13 DIAGNOSIS — M5030 Other cervical disc degeneration, unspecified cervical region: Secondary | ICD-10-CM | POA: Insufficient documentation

## 2011-07-13 DIAGNOSIS — R079 Chest pain, unspecified: Secondary | ICD-10-CM | POA: Insufficient documentation

## 2011-07-13 DIAGNOSIS — S0100XA Unspecified open wound of scalp, initial encounter: Secondary | ICD-10-CM | POA: Insufficient documentation

## 2011-07-13 DIAGNOSIS — M549 Dorsalgia, unspecified: Secondary | ICD-10-CM | POA: Insufficient documentation

## 2011-07-15 ENCOUNTER — Emergency Department (HOSPITAL_COMMUNITY)
Admission: EM | Admit: 2011-07-15 | Discharge: 2011-07-15 | Disposition: A | Discharge status: Home / Self Care | Payer: Self-pay | Attending: Emergency Medicine | Admitting: Emergency Medicine

## 2011-07-15 ENCOUNTER — Emergency Department (HOSPITAL_COMMUNITY): Payer: Self-pay

## 2011-07-15 DIAGNOSIS — R4181 Age-related cognitive decline: Secondary | ICD-10-CM | POA: Insufficient documentation

## 2011-07-15 DIAGNOSIS — S20219A Contusion of unspecified front wall of thorax, initial encounter: Secondary | ICD-10-CM | POA: Insufficient documentation

## 2011-07-15 DIAGNOSIS — R079 Chest pain, unspecified: Secondary | ICD-10-CM | POA: Insufficient documentation

## 2011-07-15 DIAGNOSIS — Z8619 Personal history of other infectious and parasitic diseases: Secondary | ICD-10-CM | POA: Insufficient documentation

## 2011-07-22 ENCOUNTER — Emergency Department (HOSPITAL_COMMUNITY)
Admission: EM | Admit: 2011-07-22 | Discharge: 2011-07-22 | Disposition: A | Discharge status: Home / Self Care | Payer: Self-pay | Attending: Emergency Medicine | Admitting: Emergency Medicine

## 2011-07-22 DIAGNOSIS — X58XXXA Exposure to other specified factors, initial encounter: Secondary | ICD-10-CM | POA: Insufficient documentation

## 2011-07-22 DIAGNOSIS — S0100XA Unspecified open wound of scalp, initial encounter: Secondary | ICD-10-CM | POA: Insufficient documentation

## 2011-07-22 DIAGNOSIS — Z4802 Encounter for removal of sutures: Secondary | ICD-10-CM | POA: Insufficient documentation

## 2011-07-22 DIAGNOSIS — Z8619 Personal history of other infectious and parasitic diseases: Secondary | ICD-10-CM | POA: Insufficient documentation

## 2011-07-22 LAB — CBC
HCT: 39.5
MCHC: 34.8
MCV: 96.4
Platelets: 233
WBC: 9.3

## 2011-07-22 LAB — POCT CARDIAC MARKERS
Myoglobin, poc: 48.2
Operator id: 5362

## 2011-07-22 LAB — COMPREHENSIVE METABOLIC PANEL
Alkaline Phosphatase: 94
BUN: 12
Chloride: 100
Glucose, Bld: 99
Potassium: 3.7
Total Bilirubin: 1.3 — ABNORMAL HIGH
Total Protein: 7.1

## 2011-07-26 LAB — CBC
HCT: 37.3
MCV: 101.1 — ABNORMAL HIGH
RBC: 3.69 — ABNORMAL LOW
WBC: 7.4

## 2011-08-16 ENCOUNTER — Ambulatory Visit: Payer: Self-pay | Admitting: Gastroenterology

## 2011-10-01 ENCOUNTER — Other Ambulatory Visit (HOSPITAL_COMMUNITY): Payer: Self-pay | Admitting: Family Medicine

## 2011-10-01 DIAGNOSIS — B182 Chronic viral hepatitis C: Secondary | ICD-10-CM

## 2011-10-07 ENCOUNTER — Ambulatory Visit (HOSPITAL_COMMUNITY)
Admission: RE | Admit: 2011-10-07 | Discharge: 2011-10-07 | Disposition: A | Discharge status: Home / Self Care | Payer: Self-pay | Source: Ambulatory Visit | Attending: Family Medicine | Admitting: Family Medicine

## 2011-10-07 DIAGNOSIS — R161 Splenomegaly, not elsewhere classified: Secondary | ICD-10-CM | POA: Insufficient documentation

## 2011-10-07 DIAGNOSIS — K7689 Other specified diseases of liver: Secondary | ICD-10-CM | POA: Insufficient documentation

## 2011-10-07 DIAGNOSIS — B1920 Unspecified viral hepatitis C without hepatic coma: Secondary | ICD-10-CM | POA: Insufficient documentation

## 2011-10-07 DIAGNOSIS — B182 Chronic viral hepatitis C: Secondary | ICD-10-CM

## 2011-10-07 DIAGNOSIS — K8020 Calculus of gallbladder without cholecystitis without obstruction: Secondary | ICD-10-CM | POA: Insufficient documentation

## 2011-10-07 DIAGNOSIS — R1011 Right upper quadrant pain: Secondary | ICD-10-CM | POA: Insufficient documentation

## 2011-10-16 ENCOUNTER — Inpatient Hospital Stay (HOSPITAL_COMMUNITY)
Admission: EM | Admit: 2011-10-16 | Discharge: 2011-10-19 | DRG: 392 | Disposition: A | Discharge status: Home / Self Care | Payer: Self-pay | Attending: Internal Medicine | Admitting: Internal Medicine

## 2011-10-16 ENCOUNTER — Encounter: Payer: Self-pay | Admitting: Emergency Medicine

## 2011-10-16 DIAGNOSIS — J4489 Other specified chronic obstructive pulmonary disease: Secondary | ICD-10-CM | POA: Diagnosis present

## 2011-10-16 DIAGNOSIS — Z8541 Personal history of malignant neoplasm of cervix uteri: Secondary | ICD-10-CM | POA: Diagnosis present

## 2011-10-16 DIAGNOSIS — B1920 Unspecified viral hepatitis C without hepatic coma: Secondary | ICD-10-CM | POA: Diagnosis present

## 2011-10-16 DIAGNOSIS — D696 Thrombocytopenia, unspecified: Secondary | ICD-10-CM

## 2011-10-16 DIAGNOSIS — K766 Portal hypertension: Secondary | ICD-10-CM | POA: Diagnosis present

## 2011-10-16 DIAGNOSIS — Z8711 Personal history of peptic ulcer disease: Secondary | ICD-10-CM

## 2011-10-16 DIAGNOSIS — F419 Anxiety disorder, unspecified: Secondary | ICD-10-CM | POA: Diagnosis present

## 2011-10-16 DIAGNOSIS — R109 Unspecified abdominal pain: Secondary | ICD-10-CM | POA: Diagnosis present

## 2011-10-16 DIAGNOSIS — F1010 Alcohol abuse, uncomplicated: Secondary | ICD-10-CM | POA: Diagnosis present

## 2011-10-16 DIAGNOSIS — K8020 Calculus of gallbladder without cholecystitis without obstruction: Secondary | ICD-10-CM | POA: Diagnosis present

## 2011-10-16 DIAGNOSIS — K319 Disease of stomach and duodenum, unspecified: Secondary | ICD-10-CM | POA: Diagnosis present

## 2011-10-16 DIAGNOSIS — I8510 Secondary esophageal varices without bleeding: Secondary | ICD-10-CM | POA: Diagnosis present

## 2011-10-16 DIAGNOSIS — F17200 Nicotine dependence, unspecified, uncomplicated: Secondary | ICD-10-CM | POA: Diagnosis present

## 2011-10-16 DIAGNOSIS — C801 Malignant (primary) neoplasm, unspecified: Secondary | ICD-10-CM

## 2011-10-16 DIAGNOSIS — C539 Malignant neoplasm of cervix uteri, unspecified: Secondary | ICD-10-CM | POA: Diagnosis present

## 2011-10-16 DIAGNOSIS — D6959 Other secondary thrombocytopenia: Secondary | ICD-10-CM | POA: Diagnosis present

## 2011-10-16 DIAGNOSIS — Z8619 Personal history of other infectious and parasitic diseases: Secondary | ICD-10-CM | POA: Diagnosis present

## 2011-10-16 DIAGNOSIS — R748 Abnormal levels of other serum enzymes: Secondary | ICD-10-CM | POA: Diagnosis present

## 2011-10-16 DIAGNOSIS — M8448XA Pathological fracture, other site, initial encounter for fracture: Secondary | ICD-10-CM | POA: Diagnosis present

## 2011-10-16 DIAGNOSIS — N390 Urinary tract infection, site not specified: Secondary | ICD-10-CM | POA: Diagnosis present

## 2011-10-16 DIAGNOSIS — R1011 Right upper quadrant pain: Principal | ICD-10-CM | POA: Diagnosis present

## 2011-10-16 DIAGNOSIS — J449 Chronic obstructive pulmonary disease, unspecified: Secondary | ICD-10-CM | POA: Diagnosis present

## 2011-10-16 DIAGNOSIS — F411 Generalized anxiety disorder: Secondary | ICD-10-CM | POA: Diagnosis present

## 2011-10-16 DIAGNOSIS — K7460 Unspecified cirrhosis of liver: Secondary | ICD-10-CM | POA: Diagnosis present

## 2011-10-16 HISTORY — DX: Palpitations: R00.2

## 2011-10-16 HISTORY — DX: Anxiety disorder, unspecified: F41.9

## 2011-10-16 HISTORY — DX: Malignant (primary) neoplasm, unspecified: C80.1

## 2011-10-16 HISTORY — DX: Unspecified viral hepatitis C without hepatic coma: B19.20

## 2011-10-16 HISTORY — DX: Chronic obstructive pulmonary disease, unspecified: J44.9

## 2011-10-16 LAB — URINALYSIS, ROUTINE W REFLEX MICROSCOPIC
Hgb urine dipstick: NEGATIVE
Nitrite: NEGATIVE
Protein, ur: NEGATIVE mg/dL
Specific Gravity, Urine: 1.006 (ref 1.005–1.030)
Urobilinogen, UA: 0.2 mg/dL (ref 0.0–1.0)

## 2011-10-16 MED ORDER — SODIUM CHLORIDE 0.9 % IV BOLUS (SEPSIS)
1000.0000 mL | Freq: Once | INTRAVENOUS | Status: AC
  Administered 2011-10-17: 1000 mL via INTRAVENOUS

## 2011-10-16 MED ORDER — SODIUM CHLORIDE 0.9 % IV BOLUS (SEPSIS)
1000.0000 mL | INTRAVENOUS | Status: AC
  Administered 2011-10-17: 1000 mL via INTRAVENOUS

## 2011-10-16 NOTE — ED Notes (Addendum)
Pt c/o abdominal pain, low back pain, right flank pain, nausea, vomitting, and constipation x 1 month; seen at [facility] at the beginning of the month and had some radiologic tests, pt states "i gotta have my spleen and gallbladder removed."; reports had 2 beers earlier today

## 2011-10-16 NOTE — ED Provider Notes (Signed)
History     CSN: [id number] Arrival date & time: 10/16/2011  5:48 PM   First MD Initiated Contact with Patient 10/16/11 2258      Chief Complaint  Patient presents with  . Back Pain  . Abdominal Pain  . Constipation  . Nausea  . Emesis    (Consider location/radiation/quality/duration/timing/severity/associated sxs/prior treatment) HPI Comments: Patient is a 50 year old female with a history of alcohol abuse, cirrhosis, hepatitis C, COPD and cervical cancer status post surgery. She states that she has had many many months of abdominal pain and constipation and has been seen at [facility] primary [facility] approximately one month ago. She states that she had a CT scan followed by ultrasound and was told that she needed to come to the emergency department yesterday. She comes today stating that she is only here because her doctor told her she needed to come. She admits to having ongoing abdominal pain and feels "sick" all the time. She denies diarrhea or vomiting but does have nausea in the mornings when she coughs. The pain is sharp, intermittent, diffusely across her abdomen. She has no associated fevers she does admit to having 2 beers prior to arrival.  She does admit to several months of bright red blood per rectum with hard bowel movements  Patient is a 50 y.o. female presenting with back pain, abdominal pain, constipation, and vomiting. The history is provided by the patient.  Back Pain  Associated symptoms include abdominal pain.  Abdominal Pain The primary symptoms of the illness include abdominal pain and vomiting.  Additional symptoms associated with the illness include constipation and back pain.  Constipation  Associated symptoms include abdominal pain and vomiting.  Emesis  Associated symptoms include abdominal pain.    Past Medical History  Diagnosis Date  . Cirrhosis   . Hepatitis C   . COPD (chronic obstructive pulmonary disease)   . Heart  palpitations   . Cancer     cervical    Past Surgical History  Procedure Date  . Cervical cancer surgery   . Abdominal hysterectomy     No family history on file.  History  Substance Use Topics  . Smoking status: Passive Smoker    Types: Cigarettes  . Smokeless tobacco: Not on file  . Alcohol Use: Yes     occasionally    OB History    Grav Para Term Preterm Abortions TAB SAB Ect Mult Living                  Review of Systems  Gastrointestinal: Positive for vomiting, abdominal pain and constipation.  Musculoskeletal: Positive for back pain.  All other systems reviewed and are negative.    Allergies  Review of patient's allergies indicates no known allergies.  Home Medications   Current Outpatient Rx  Name Route Sig Dispense Refill  . DOXEPIN HCL 25 MG PO CAPS Oral Take 50 mg by mouth 4 (four) times daily.      Marland Kitchen FLUCONAZOLE 150 MG PO TABS Oral Take 150 mg by mouth every 7 (seven) days.      . IBUPROFEN 200 MG PO TABS Oral Take 200 mg by mouth every 6 (six) hours as needed. Pain relief       BP 130/72  Pulse 80  Temp(Src) 98.7 F (37.1 C) (Oral)  Resp 16  SpO2 94%  Physical Exam  Nursing note and vitals reviewed. Constitutional: She appears well-developed and well-nourished. No distress.  HENT:  Head: Normocephalic  and atraumatic.  Mouth/Throat: Oropharynx is clear and moist. No oropharyngeal exudate.  Eyes: Conjunctivae and EOM are normal. Pupils are equal, round, and reactive to light. Right eye exhibits no discharge. Left eye exhibits no discharge. No scleral icterus.  Neck: Normal range of motion. Neck supple. No JVD present. No thyromegaly present.  Cardiovascular: Regular rhythm, normal heart sounds and intact distal pulses.  Exam reveals no gallop and no friction rub.   No murmur heard.      Tachycardia to 110  Pulmonary/Chest: Effort normal and breath sounds normal. No respiratory distress. She has no wheezes. She has no rales.  Abdominal: Soft.  Bowel sounds are normal. She exhibits no distension and no mass. There is tenderness.       Mild abdominal tenderness mid abdomen, right upper quadrant, left upper quadrant, suprapubic. No guarding, no distention, non-peritoneal, no obvious hepatosplenomegaly or other masses palpated  Musculoskeletal: Normal range of motion. She exhibits no edema and no tenderness.  Lymphadenopathy:    She has no cervical adenopathy.  Neurological: She is alert. Coordination normal.  Skin: Skin is warm and dry. No rash noted. No erythema.  Psychiatric: She has a normal mood and affect. Her behavior is normal.    ED Course  Procedures (including critical care time)  Labs Reviewed  URINALYSIS, ROUTINE W REFLEX MICROSCOPIC - Abnormal; Notable for the following:    APPearance CLOUDY (*)    All other components within normal limits  CBC - Abnormal; Notable for the following:    MCH 34.9 (*)    Platelets 100 (*)    All other components within normal limits  DIFFERENTIAL - Abnormal; Notable for the following:    Neutrophils Relative 39 (*)    Monocytes Relative 15 (*)    Monocytes Absolute 1.1 (*)    All other components within normal limits  COMPREHENSIVE METABOLIC PANEL - Abnormal; Notable for the following:    Glucose, Bld 109 (*)    Albumin 3.4 (*)    AST 166 (*)    ALT 88 (*)    Alkaline Phosphatase 182 (*)    Total Bilirubin 1.4 (*)    All other components within normal limits  LACTIC ACID, PLASMA - Abnormal; Notable for the following:    Lactic Acid, Venous 3.7 (*)    All other components within normal limits  APTT - Abnormal; Notable for the following:    aPTT 38 (*)    All other components within normal limits  PROTIME-INR - Abnormal; Notable for the following:    Prothrombin Time 15.3 (*)    All other components within normal limits  POCT I-STAT, CHEM 8 - Abnormal; Notable for the following:    BUN 3 (*)    Glucose, Bld 105 (*)    Calcium, Ion 1.09 (*)    All other components within  normal limits  AMMONIA  I-STAT, CHEM 8   Dg Chest 2 View  10/17/2011  *RADIOLOGY REPORT*  Clinical Data: Shortness of breath  CHEST - 2 VIEW  Comparison: None.  Findings: Mild hyperinflation.  Interstitial prominence. No focal consolidation.  No pleural effusion or pneumothorax. Cardiomediastinal contours are within normal limits. There is a anterior compression fracture of a lower thoracic vertebral body with approximately 25% height loss,, age indeterminate.  IMPRESSION: Interstitial prominence without focal consolidation.  Compression fracture of a lower thoracic vertebral body, age indeterminate.  Original Report Authenticated By: Waneta Martins, M.D.   Ct Abdomen Pelvis W Contrast  10/17/2011  *RADIOLOGY  REPORT*  Clinical Data: Abdominal pain, elevated lactate.  CT ABDOMEN AND PELVIS WITH CONTRAST  Technique:  Multidetector CT imaging of the abdomen and pelvis was performed following the standard protocol during bolus administration of intravenous contrast.  Contrast: OMNIPAQUE IOHEXOL 300 MG/ML IV SOLN  Comparison: None.  Findings: Right lower lobe consolidation.  No pleural or pericardial effusion.  Low attenuation of the liver with lobular contour.  No focal abnormality however note that the study is not optimized to evaluate for hepatic cellular carcinoma.  Cholelithiasis.  No gallbladder wall thickening or pericholecystic fluid.  No biliary ductal dilatation.  Splenomegaly.  Unremarkable pancreas and adrenal glands.  Symmetric renal enhancement.  No hydronephrosis or hydroureter. Prominent porta hepatis lymph nodes.  No bowel obstruction.  No CT evidence for colitis.  Normal appendix.  No free intraperitoneal air or fluid.  Patent portal vein.  Paresophageal varices.  Mild asymmetric bladder wall thickening on the left.  Surgical clips along the pelvic sidewalls and retroperitoneum. Scattered atherosclerotic calcification of the aorta and its branches.  Bilateral L3 transverse process  fractures.  L1 compression fracture with 30% height loss.  Associated sclerosis.  An underlying lesion is not excluded.  IMPRESSION: Right lower lung opacity; atelectasis versus infiltrate.  Cirrhotic liver morphology with stigmata of portal hypertension including splenomegaly and para esophageal varices.  Note that the study is not optimized to evaluate for hepatocellular carcinoma and a surveillance MRI is recommended.  Cholelithiasis without CT evidence for cholecystitis.  Fractures of the L3 transverse processes and compression fracture of the L1 vertebral body with 30% height loss. Correlate with point tenderness and trauma history.  Given the sclerotic appearance of L1, cannot exclude an underlying lesion by CT.  Recommend correlation with outside imaging and MR if warranted.  Left sided bladder wall thickening without focal lesion.  Correlate with urinalysis and cystoscopy if warranted.  Discussed via telephone with Dr. Hyacinth Meeker at 05:20 a.m. on 10/17/2011.  Original Report Authenticated By: Waneta Martins, M.D.     1. Abdominal pain   2. Cirrhosis   3. Thrombocytopenia       MDM  Mild abdominal pain which is persistent. She is also tachycardic and has a slight hypoxia at 95% on room air. She has no pulmonary symptoms at this time and is afebrile. Will check blood work, imaging of chest, rectal exam. IV fluids.    2:20 AM, increased lactic acid at 3.7, elevated liver function consistent with transaminitis likely from alcohol abuse, no anion gap, no leukocytosis or anemia. There is thrombocytopenia likely from ETOH abuse.  i have d/w hospitalist who requests CT.  CT reviewed with pt, hospitlaist and gen surg - Dr. Biagio Quint - will be admitted by hospitalist, gen surg to consult.  Vida Roller, MD 10/17/11 0600

## 2011-10-16 NOTE — ED Notes (Signed)
Pt states that PCP's RN said "Either you go to the emergency department or I'm going to call 911 and the police will take you" Pt states RN wanted her to come to ED "because I had an ultrasound done two weeks ago and I have a very enlarged spleen" RN said "I don't even know how you're walking around"

## 2011-10-17 ENCOUNTER — Encounter (HOSPITAL_COMMUNITY): Payer: Self-pay | Admitting: Internal Medicine

## 2011-10-17 ENCOUNTER — Emergency Department (HOSPITAL_COMMUNITY): Payer: Self-pay

## 2011-10-17 DIAGNOSIS — R109 Unspecified abdominal pain: Secondary | ICD-10-CM

## 2011-10-17 DIAGNOSIS — B182 Chronic viral hepatitis C: Secondary | ICD-10-CM

## 2011-10-17 DIAGNOSIS — Z8619 Personal history of other infectious and parasitic diseases: Secondary | ICD-10-CM | POA: Diagnosis present

## 2011-10-17 DIAGNOSIS — K8020 Calculus of gallbladder without cholecystitis without obstruction: Secondary | ICD-10-CM

## 2011-10-17 DIAGNOSIS — K7030 Alcoholic cirrhosis of liver without ascites: Secondary | ICD-10-CM

## 2011-10-17 DIAGNOSIS — Z8541 Personal history of malignant neoplasm of cervix uteri: Secondary | ICD-10-CM | POA: Diagnosis present

## 2011-10-17 DIAGNOSIS — F419 Anxiety disorder, unspecified: Secondary | ICD-10-CM | POA: Diagnosis present

## 2011-10-17 DIAGNOSIS — R933 Abnormal findings on diagnostic imaging of other parts of digestive tract: Secondary | ICD-10-CM

## 2011-10-17 DIAGNOSIS — J449 Chronic obstructive pulmonary disease, unspecified: Secondary | ICD-10-CM | POA: Diagnosis present

## 2011-10-17 LAB — APTT: aPTT: 38 seconds — ABNORMAL HIGH (ref 24–37)

## 2011-10-17 LAB — COMPREHENSIVE METABOLIC PANEL
ALT: 88 U/L — ABNORMAL HIGH (ref 0–35)
AST: 166 U/L — ABNORMAL HIGH (ref 0–37)
Albumin: 3.4 g/dL — ABNORMAL LOW (ref 3.5–5.2)
CO2: 24 mEq/L (ref 19–32)
Calcium: 9.5 mg/dL (ref 8.4–10.5)
Chloride: 104 mEq/L (ref 96–112)
Creatinine, Ser: 0.54 mg/dL (ref 0.50–1.10)
Sodium: 139 mEq/L (ref 135–145)

## 2011-10-17 LAB — DIFFERENTIAL
Basophils Absolute: 0.1 10*3/uL (ref 0.0–0.1)
Eosinophils Relative: 2 % (ref 0–5)
Lymphs Abs: 3.2 10*3/uL (ref 0.7–4.0)
Monocytes Absolute: 1.1 10*3/uL — ABNORMAL HIGH (ref 0.1–1.0)
Monocytes Relative: 15 % — ABNORMAL HIGH (ref 3–12)
Neutrophils Relative %: 39 % — ABNORMAL LOW (ref 43–77)

## 2011-10-17 LAB — CBC
MCV: 99.7 fL (ref 78.0–100.0)
Platelets: 100 10*3/uL — ABNORMAL LOW (ref 150–400)
RBC: 3.95 MIL/uL (ref 3.87–5.11)
RDW: 15.1 % (ref 11.5–15.5)
WBC: 7.5 10*3/uL (ref 4.0–10.5)

## 2011-10-17 LAB — POCT I-STAT, CHEM 8
HCT: 43 % (ref 36.0–46.0)
Hemoglobin: 14.6 g/dL (ref 12.0–15.0)
Potassium: 3.6 mEq/L (ref 3.5–5.1)
Sodium: 144 mEq/L (ref 135–145)
TCO2: 23 mmol/L (ref 0–100)

## 2011-10-17 LAB — AMMONIA: Ammonia: 41 umol/L (ref 11–60)

## 2011-10-17 LAB — AFP TUMOR MARKER: AFP-Tumor Marker: 13.6 ng/mL — ABNORMAL HIGH (ref 0.0–8.0)

## 2011-10-17 LAB — CEA: CEA: 3.2 ng/mL (ref 0.0–5.0)

## 2011-10-17 LAB — PROTIME-INR: INR: 1.18 (ref 0.00–1.49)

## 2011-10-17 LAB — LIPASE, BLOOD: Lipase: 27 U/L (ref 11–59)

## 2011-10-17 MED ORDER — SODIUM CHLORIDE 0.45 % IV SOLN: Freq: Once | INTRAVENOUS | Status: DC

## 2011-10-17 MED ORDER — ALBUTEROL SULFATE (5 MG/ML) 0.5% IN NEBU
2.5000 mg | INHALATION_SOLUTION | RESPIRATORY_TRACT | Status: DC | PRN
  Filled 2011-10-17: qty 0.5

## 2011-10-17 MED ORDER — LORAZEPAM 0.5 MG PO TABS
0.5000 mg | ORAL_TABLET | Freq: Two times a day (BID) | ORAL | Status: DC
  Administered 2011-10-17 – 2011-10-19 (×4): 0.5 mg via ORAL
  Filled 2011-10-17 (×4): qty 1

## 2011-10-17 MED ORDER — ONDANSETRON HCL 4 MG PO TABS
4.0000 mg | ORAL_TABLET | Freq: Four times a day (QID) | ORAL | Status: DC | PRN
  Filled 2011-10-17: qty 1

## 2011-10-17 MED ORDER — GUAIFENESIN-DM 100-10 MG/5ML PO SYRP
5.0000 mL | ORAL_SOLUTION | ORAL | Status: DC | PRN
  Filled 2011-10-17: qty 10

## 2011-10-17 MED ORDER — IOHEXOL 300 MG/ML  SOLN
100.0000 mL | Freq: Once | INTRAMUSCULAR | Status: AC | PRN
  Administered 2011-10-17: 100 mL via INTRAVENOUS

## 2011-10-17 MED ORDER — DOXEPIN HCL 50 MG PO CAPS
50.0000 mg | ORAL_CAPSULE | Freq: Four times a day (QID) | ORAL | Status: DC
  Administered 2011-10-17 – 2011-10-19 (×8): 50 mg via ORAL
  Filled 2011-10-17 (×12): qty 1

## 2011-10-17 MED ORDER — ONDANSETRON HCL 4 MG/2ML IJ SOLN
4.0000 mg | Freq: Four times a day (QID) | INTRAMUSCULAR | Status: DC | PRN
  Filled 2011-10-17: qty 2

## 2011-10-17 MED ORDER — SODIUM CHLORIDE 0.9 % IV SOLN
INTRAVENOUS | Status: AC
  Administered 2011-10-17: 10:00:00 via INTRAVENOUS

## 2011-10-17 MED ORDER — HYDROCODONE-ACETAMINOPHEN 5-325 MG PO TABS: 1.0000 | ORAL_TABLET | ORAL | Status: DC | PRN

## 2011-10-17 MED ORDER — FLUCONAZOLE 150 MG PO TABS
150.0000 mg | ORAL_TABLET | ORAL | Status: DC
  Administered 2011-10-17: 150 mg via ORAL
  Filled 2011-10-17: qty 1

## 2011-10-17 MED ORDER — SODIUM CHLORIDE 0.9 % IV SOLN
1.0000 g | Freq: Once | INTRAVENOUS | Status: AC
  Administered 2011-10-17: 1 g via INTRAVENOUS
  Filled 2011-10-17: qty 10

## 2011-10-17 NOTE — Progress Notes (Signed)
CCS Attending:  Patient seen and examined in ER room 33.  Reviewed notes from Drs. Biagio Quint and [place], and [employer].  Patient with intermittent RUQ abdominal pain.  Known history of cholelithiasis.  Given cirrhosis and portal hypertension, patient would be at high risk of complications or death from cholecystectomy.  Work up per GI.  Will follow.  No operative intervention planned at present.  Velora Heckler, MD, FACS General & Endocrine Surgery [facility]

## 2011-10-17 NOTE — ED Notes (Signed)
Patient transported to CT 

## 2011-10-17 NOTE — Progress Notes (Signed)
ED CM noted Admission CM consult for difficulty in getting medications.  CM spoke with pt. She confirms she is a [facility] pt and gets assistance with most of her medications there. States [facility] "does not have ativan" so she "gets it from [facility]" CM discussed local health department medication assistant program and Ativan patient assistant program. Cm provided pt with Rx out reach application for assist with Ativan.  [facility] pharmacy confirmed pt eligible for indigent medication assistance under name [name]. Also CM noted pt listed under name [name]. Will check to see if assistance has been provide under name of [name].  Pt voiced understanding.

## 2011-10-17 NOTE — Consult Note (Signed)
[facility] Consultation  Referring Provider: [facility] Primary Care Physician:  [facility] Primary Gastroenterologist:   None Reason for Consultation:  Cirrhosis, possible hepatocellular carcinoma  HPI: [name] [name] is a 50 y.o. female with a history of HCV, COPD and cervical cancer. Patient poor historian. She has been having intermittent RUQ pain for months. She recently saw her PCP for the pain and her LFTs were found to be abnormal. A CTscan was done and c/w cirrhosis, portal hypertension, gallstones and some lumbar fractures. Patient has seen Dr. Jacqualine Mau [facility] hepatology clinic in [place]). It sounds like she declined HCV treatment out of fear of side effects. Patient used to drink heavily, over last several months she has only consumed ETOH intermittently however. Several family members on mother's side had cirrhosis, sounds like  ETOH related. Patient has a history of cervical cancer requiring surgical resection (retroperitoneal involvement ?).  She gives a remote history of PUD though doesn't sound like that was ever formerly evaluated but rather treated empirically.  Since being of pain medications patient has been using NSAIDS.  Patient complains of constipation. She was on pain medication for arthritis but "had to come off". Over last couple of days her stools have actually been on loose side. After straining she occassionally sees a small amount of bright red blood.    Past Medical History  Diagnosis Date  . Cirrhosis   . Hepatitis C   . COPD (chronic obstructive pulmonary disease)   . Heart palpitations   . cervical Cancer     cervical  . Anxiety     Past Surgical History  Procedure Date  . Cervical cancer surgery   . Abdominal hysterectomy   . Tubal ligation     Prior to Admission medications   Medication Sig Start Date End Date Taking? Authorizing Provider  doxepin (SINEQUAN) 25 MG capsule Take 50 mg by mouth 4 (four) times daily.     Yes  Historical Provider, MD  fluconazole (DIFLUCAN) 150 MG tablet Take 150 mg by mouth every 7 (seven) days.     Yes Historical Provider, MD  ibuprofen (ADVIL,MOTRIN) 200 MG tablet Take 200 mg by mouth every 6 (six) hours as needed. Pain relief    Yes Historical Provider, MD    Current Facility-Administered Medications  Medication Dose Route Frequency Provider Last Rate Last Dose  . 0.9 %  sodium chloride infusion   Intravenous STAT Vida Roller, MD 150 mL/hr at 10/17/11 1026    . albuterol (PROVENTIL) (5 MG/ML) 0.5% nebulizer solution 2.5 mg  2.5 mg Nebulization Q2H PRN Leroy Sea, MD      . calcium gluconate 1 g in sodium chloride 0.9 % 100 mL IVPB  1 g Intravenous Once Leroy Sea, MD      . doxepin (SINEQUAN) capsule 50 mg  50 mg Oral QID Leroy Sea, MD   50 mg at 10/17/11 1026  . fluconazole (DIFLUCAN) tablet 150 mg  150 mg Oral Q7 days Leroy Sea, MD   150 mg at 10/17/11 1026  . guaiFENesin-dextromethorphan (ROBITUSSIN DM) 100-10 MG/5ML syrup 5 mL  5 mL Oral Q4H PRN Leroy Sea, MD      . HYDROcodone-acetaminophen (NORCO) 5-325 MG per tablet 1-2 tablet  1-2 tablet Oral Q4H PRN Leroy Sea, MD      . iohexol (OMNIPAQUE) 300 MG/ML solution 100 mL  100 mL Intravenous Once PRN Medication Radiologist   100 mL at 10/17/11 0455  . LORazepam (ATIVAN) tablet  0.5 mg  0.5 mg Oral BID Leroy Sea, MD   0.5 mg at 10/17/11 1026  . ondansetron (ZOFRAN) tablet 4 mg  4 mg Oral Q6H PRN Leroy Sea, MD       Or  . ondansetron (ZOFRAN) injection 4 mg  4 mg Intravenous Q6H PRN Leroy Sea, MD      . sodium chloride 0.9 % bolus 1,000 mL  1,000 mL Intravenous STAT Vida Roller, MD   1,000 mL at 10/17/11 0037  . sodium chloride 0.9 % bolus 1,000 mL  1,000 mL Intravenous Once Vida Roller, MD   1,000 mL at 10/17/11 0119   Current Outpatient Prescriptions  Medication Sig Dispense Refill  . doxepin (SINEQUAN) 25 MG capsule Take 50 mg by mouth 4 (four) times daily.         . fluconazole (DIFLUCAN) 150 MG tablet Take 150 mg by mouth every 7 (seven) days.        Marland Kitchen ibuprofen (ADVIL,MOTRIN) 200 MG tablet Take 200 mg by mouth every 6 (six) hours as needed. Pain relief         Allergies as of 10/16/2011  . (No Known Allergies)    History reviewed. No pertinent family history.  History   Social History  . Marital Status: Married    Spouse Name: N/A    Number of Children: N/A  . Years of Education: N/A   Occupational History  . Not on file.   Social History Main Topics  . Smoking status: Former Smoker -- 40 years    Types: Cigarettes    Quit date: 10/16/2011  . Smokeless tobacco: Never Used  . Alcohol Use: Yes     occasionally  . Drug Use: No  . Sexually Active: Not on file   Other Topics Concern  . Not on file   Social History Narrative  . No narrative on file    Review of Systems: Her weight has fluctuated several pounds.   PHYSICAL EXAM: Vital signs in last 24 hours: Temp:  [98.3 F (36.8 C)-98.8 F (37.1 C)] 98.4 F (36.9 C) (12/20 0721) Pulse Rate:  [80-115] 82  (12/20 0718) Resp:  [16-20] 16  (12/20 0420) BP: (114-153)/(60-95) 134/78 mmHg (12/20 0718) SpO2:  [93 %-96 %] 96 % (12/20 0718)   General:  Chronically ill appearing pleasant white female in NAD Head:  Normocephalic and atraumatic. Eyes:   No icterus.   Conjunctiva pink. Ears:  Normal auditory acuity. Mouth:  Poor dentition Neck:  Supple; no masses felt Lungs:  Respirations even and unlabored. Rhonchi throughout lung fields.   Heart:  Regular rate and rhythm; no murmurs heard. Abdomen:  Soft, nondistended, mild RUQ tenderness. Normal bowel sounds. No appreciable masses or hepatomegaly.  Rectal:  Not performed.  Msk:  Symmetrical without gross deformities.  Extremities:  Without edema. Neurologic:  Alert and  oriented x4;  grossly normal neurologically. Skin:  Intact without significant lesions or rashes. Spider nevi on chest. Bilateral palmar  erythema Cervical Nodes:  No significant cervical adenopathy. Psych:  Alert and cooperative. Normal mood and affect.  LAB RESULTS:  Basename 10/17/11 0055 10/17/11 0040  WBC -- 7.5  HGB 14.6 13.8  HCT 43.0 39.4  PLT -- 100*   BMET  Basename 10/17/11 0055 10/17/11 0040  NA 144 139  K 3.6 3.6  CL 105 104  CO2 -- 24  GLUCOSE 105* 109*  BUN 3* 6  CREATININE 0.70 0.54  CALCIUM -- 9.5  LFT  Basename 10/17/11 0040  PROT 7.7  ALBUMIN 3.4*  AST 166*  ALT 88*  ALKPHOS 182*  BILITOT 1.4*  BILIDIR --  IBILI --   PT/INR  Basename 10/17/11 0040  LABPROT 15.3*  INR 1.18    STUDIES: Dg Chest 2 View  10/17/2011  *RADIOLOGY REPORT*  Clinical Data: Shortness of breath  CHEST - 2 VIEW  Comparison: None.  Findings: Mild hyperinflation.  Interstitial prominence. No focal consolidation.  No pleural effusion or pneumothorax. Cardiomediastinal contours are within normal limits. There is a anterior compression fracture of a lower thoracic vertebral body with approximately 25% height loss,, age indeterminate.  IMPRESSION: Interstitial prominence without focal consolidation.  Compression fracture of a lower thoracic vertebral body, age indeterminate.  Original Report Authenticated By: Waneta Martins, M.D.   Ct Abdomen Pelvis W Contrast  10/17/2011  *RADIOLOGY REPORT*  Clinical Data: Abdominal pain, elevated lactate.  CT ABDOMEN AND PELVIS WITH CONTRAST  Technique:  Multidetector CT imaging of the abdomen and pelvis was performed following the standard protocol during bolus administration of intravenous contrast.  Contrast: OMNIPAQUE IOHEXOL 300 MG/ML IV SOLN  Comparison: None.  Findings: Right lower lobe consolidation.  No pleural or pericardial effusion.  Low attenuation of the liver with lobular contour.  No focal abnormality however note that the study is not optimized to evaluate for hepatic cellular carcinoma.  Cholelithiasis.  No gallbladder wall thickening or pericholecystic  fluid.  No biliary ductal dilatation.  Splenomegaly.  Unremarkable pancreas and adrenal glands.  Symmetric renal enhancement.  No hydronephrosis or hydroureter. Prominent porta hepatis lymph nodes.  No bowel obstruction.  No CT evidence for colitis.  Normal appendix.  No free intraperitoneal air or fluid.  Patent portal vein.  Paresophageal varices.  Mild asymmetric bladder wall thickening on the left.  Surgical clips along the pelvic sidewalls and retroperitoneum. Scattered atherosclerotic calcification of the aorta and its branches.  Bilateral L3 transverse process fractures.  L1 compression fracture with 30% height loss.  Associated sclerosis.  An underlying lesion is not excluded.  IMPRESSION: Right lower lung opacity; atelectasis versus infiltrate.  Cirrhotic liver morphology with stigmata of portal hypertension including splenomegaly and para esophageal varices.  Note that the study is not optimized to evaluate for hepatocellular carcinoma and a surveillance MRI is recommended.  Cholelithiasis without CT evidence for cholecystitis.  Fractures of the L3 transverse processes and compression fracture of the L1 vertebral body with 30% height loss. Correlate with point tenderness and trauma history.  Given the sclerotic appearance of L1, cannot exclude an underlying lesion by CT.  Recommend correlation with outside imaging and MR if warranted.  Left sided bladder wall thickening without focal lesion.  Correlate with urinalysis and cystoscopy if warranted.  Discussed via telephone with Dr. Hyacinth Meeker at 05:20 a.m. on 10/17/2011.  Original Report Authenticated By: Waneta Martins, M.D.    PREVIOUS ENDOSCOPIES: Describes a colonoscopy done one year ago but I am unable to locate who may have done it.   IMPRESSION / PLAN: 1. HCV / ETOH Cirrhosis complicated by portal hypertension. HCV never treated. Continues to drink intermittently. Stop ETOH. Stop NSAIDS.  2.RUQ pain of several months duration. LFTs elevated  but not unusual in setting of HCV and ETOH. No biliary duct dilation to suggest choledocholithiasis. Gallstones on imaging but surgery doesn't feel this is acute cholecystitis. There isn't a suspicious liver lesion on CTscan but should still get AFP as cirrhosis is risk factor for HCC. At some point she  should get EGD with probable esophageal banding.  Of course PUD as source of RUQ is possible, especially given NSAID use.  3. Lumbar fractures on CTscan, malignant fracture not excluded. Compression fracture of lower thoracic vertebrae seen on CXR. This could be a cause of RUQ pain as well. Reason for all these fractures (malignancy??) 5. COPD 6. History of cervical cancer, apparently with retroperitoneal involvement. She is s/p resection. Patient isn't currently seeing an oncologist.   7. Chronic constipation - stools loose at present. Ultimately she may benefit from Miralax or even lactulose.   thanks   LOS: 1 day   Willette Cluster  10/17/2011, 11:13 AM    ________________________________________________________________________  [facility] MD note:  I personally examined the patient, reviewed the data and agree with the assessment and plan described above.  CT shows no discrete liver lesions.  I don't think MRI is needed at this point. AFP for HCC screening is a good idea.  Will plan on EGD for abd pains and also for variceal screening.  She has hep c, still drinks Etoh periodically.   Rob Bunting, MD [facility] Pager [phone]

## 2011-10-17 NOTE — Consult Note (Signed)
Reason for Consult: abdominal pain Referring Physician: [name] is an 50 y.o. female.  HPI: this patient was seen for evaluation of upper abdominal and right quadrant pain which has been present for quite some time. She denies any current pain but was told that she needed to present to the emergency room for evaluation from her primary care office. She had ultrasound of the abdomen a week ago which demonstrate cholelithiasis and some splenomegaly but no evidence of acute cholecystitis. She's been having some right upper quadrant upper double discomfort and left upper quadrant pain for a while now for which she describes as radiating to her right side into her back. She has occasional nausea. She is normally constipated although she's had diarrhea since taking CT contrast given in the emergency room. She states the pain occurs daily and is worse with particular foods such as red meats and lasts approximately one hour and then will spontaneously resolve. She does have a history of reflux and takes Maalox routinely for this and she has had some bright blood in her stool but no melena. She had a colonoscopy approximately one year ago per the patient report without any significant abnormality. She does state that she has a history of peptic ulcer disease and takes ibuprofen daily for back pain and arthritis. She does have a history of cervical cancer and has had a "spread to the lymph nodes" which required retroperitoneal lymphadenectomy. She does not see an endocrinologist or oncologist routinely for this. She does have a history of alcohol abuse although she states that she does not drink routinely now.  Past Medical History  Diagnosis Date  . Cirrhosis   . Hepatitis C   . COPD (chronic obstructive pulmonary disease)   . Heart palpitations   . Cancer     cervical  panic attacks PUD palpitaitons  Past Surgical History  Procedure Date  . Cervical cancer surgery   . Abdominal  hysterectomy   tubal ligation  No family history on file.  Social History:  reports that she has been passively smoking Cigarettes.  She does not have any smokeless tobacco history on file. She reports that she drinks alcohol. She reports that she does not use illicit drugs.  Allergies: No Known Allergies  Medications: I have reviewed the patient's current medications.  Results for orders placed during the hospital encounter of 10/16/11 (from the past 48 hour(s))  URINALYSIS, ROUTINE W REFLEX MICROSCOPIC     Status: Abnormal   Collection Time   10/16/11  6:33 PM      Component Value Range Comment   Color, Urine YELLOW  YELLOW     APPearance CLOUDY (*) CLEAR     Specific Gravity, Urine 1.006  1.005 - 1.030     pH 5.5  5.0 - 8.0     Glucose, UA NEGATIVE  NEGATIVE (mg/dL)    Hgb urine dipstick NEGATIVE  NEGATIVE     Bilirubin Urine NEGATIVE  NEGATIVE     Ketones, ur NEGATIVE  NEGATIVE (mg/dL)    Protein, ur NEGATIVE  NEGATIVE (mg/dL)    Urobilinogen, UA 0.2  0.0 - 1.0 (mg/dL)    Nitrite NEGATIVE  NEGATIVE     Leukocytes, UA NEGATIVE  NEGATIVE  MICROSCOPIC NOT DONE ON URINES WITH NEGATIVE PROTEIN, BLOOD, LEUKOCYTES, NITRITE, OR GLUCOSE <1000 mg/dL.  CBC     Status: Abnormal   Collection Time   10/17/11 12:40 AM      Component Value Range Comment  WBC 7.5  4.0 - 10.5 (K/uL)    RBC 3.95  3.87 - 5.11 (MIL/uL)    Hemoglobin 13.8  12.0 - 15.0 (g/dL)    HCT 78.2  95.6 - 21.3 (%)    MCV 99.7  78.0 - 100.0 (fL)    MCH 34.9 (*) 26.0 - 34.0 (pg)    MCHC 35.0  30.0 - 36.0 (g/dL)    RDW 08.6  57.8 - 46.9 (%)    Platelets 100 (*) 150 - 400 (K/uL)   DIFFERENTIAL     Status: Abnormal   Collection Time   10/17/11 12:40 AM      Component Value Range Comment   Neutrophils Relative 39 (*) 43 - 77 (%)    Lymphocytes Relative 43  12 - 46 (%)    Monocytes Relative 15 (*) 3 - 12 (%)    Eosinophils Relative 2  0 - 5 (%)    Basophils Relative 1  0 - 1 (%)    Neutro Abs 2.9  1.7 - 7.7 (K/uL)     Lymphs Abs 3.2  0.7 - 4.0 (K/uL)    Monocytes Absolute 1.1 (*) 0.1 - 1.0 (K/uL)    Eosinophils Absolute 0.2  0.0 - 0.7 (K/uL)    Basophils Absolute 0.1  0.0 - 0.1 (K/uL)    RBC Morphology TEARDROP CELLS     COMPREHENSIVE METABOLIC PANEL     Status: Abnormal   Collection Time   10/17/11 12:40 AM      Component Value Range Comment   Sodium 139  135 - 145 (mEq/L)    Potassium 3.6  3.5 - 5.1 (mEq/L)    Chloride 104  96 - 112 (mEq/L)    CO2 24  19 - 32 (mEq/L)    Glucose, Bld 109 (*) 70 - 99 (mg/dL)    BUN 6  6 - 23 (mg/dL)    Creatinine, Ser 6.29  0.50 - 1.10 (mg/dL)    Calcium 9.5  8.4 - 10.5 (mg/dL)    Total Protein 7.7  6.0 - 8.3 (g/dL)    Albumin 3.4 (*) 3.5 - 5.2 (g/dL)    AST 528 (*) 0 - 37 (U/L)    ALT 88 (*) 0 - 35 (U/L)    Alkaline Phosphatase 182 (*) 39 - 117 (U/L)    Total Bilirubin 1.4 (*) 0.3 - 1.2 (mg/dL)    GFR calc non Af Amer >90  >90 (mL/min)    GFR calc Af Amer >90  >90 (mL/min)   LACTIC ACID, PLASMA     Status: Abnormal   Collection Time   10/17/11 12:40 AM      Component Value Range Comment   Lactic Acid, Venous 3.7 (*) 0.5 - 2.2 (mmol/L)   AMMONIA     Status: Normal   Collection Time   10/17/11 12:40 AM      Component Value Range Comment   Ammonia 41  11 - 60 (umol/L)   APTT     Status: Abnormal   Collection Time   10/17/11 12:40 AM      Component Value Range Comment   aPTT 38 (*) 24 - 37 (seconds)   PROTIME-INR     Status: Abnormal   Collection Time   10/17/11 12:40 AM      Component Value Range Comment   Prothrombin Time 15.3 (*) 11.6 - 15.2 (seconds)    INR 1.18  0.00 - 1.49    POCT I-STAT, CHEM 8     Status: Abnormal  Collection Time   10/17/11 12:55 AM      Component Value Range Comment   Sodium 144  135 - 145 (mEq/L)    Potassium 3.6  3.5 - 5.1 (mEq/L)    Chloride 105  96 - 112 (mEq/L)    BUN 3 (*) 6 - 23 (mg/dL)    Creatinine, Ser 7.82  0.50 - 1.10 (mg/dL)    Glucose, Bld 956 (*) 70 - 99 (mg/dL)    Calcium, Ion 2.13 (*) 1.12 - 1.32  (mmol/L)    TCO2 23  0 - 100 (mmol/L)    Hemoglobin 14.6  12.0 - 15.0 (g/dL)    HCT 08.6  57.8 - 46.9 (%)     Dg Chest 2 View  10/17/2011  *RADIOLOGY REPORT*  Clinical Data: Shortness of breath  CHEST - 2 VIEW  Comparison: None.  Findings: Mild hyperinflation.  Interstitial prominence. No focal consolidation.  No pleural effusion or pneumothorax. Cardiomediastinal contours are within normal limits. There is a anterior compression fracture of a lower thoracic vertebral body with approximately 25% height loss,, age indeterminate.  IMPRESSION: Interstitial prominence without focal consolidation.  Compression fracture of a lower thoracic vertebral body, age indeterminate.  Original Report Authenticated By: Waneta Martins, M.D.   Ct Abdomen Pelvis W Contrast  10/17/2011  *RADIOLOGY REPORT*  Clinical Data: Abdominal pain, elevated lactate.  CT ABDOMEN AND PELVIS WITH CONTRAST  Technique:  Multidetector CT imaging of the abdomen and pelvis was performed following the standard protocol during bolus administration of intravenous contrast.  Contrast: OMNIPAQUE IOHEXOL 300 MG/ML IV SOLN  Comparison: None.  Findings: Right lower lobe consolidation.  No pleural or pericardial effusion.  Low attenuation of the liver with lobular contour.  No focal abnormality however note that the study is not optimized to evaluate for hepatic cellular carcinoma.  Cholelithiasis.  No gallbladder wall thickening or pericholecystic fluid.  No biliary ductal dilatation.  Splenomegaly.  Unremarkable pancreas and adrenal glands.  Symmetric renal enhancement.  No hydronephrosis or hydroureter. Prominent porta hepatis lymph nodes.  No bowel obstruction.  No CT evidence for colitis.  Normal appendix.  No free intraperitoneal air or fluid.  Patent portal vein.  Paresophageal varices.  Mild asymmetric bladder wall thickening on the left.  Surgical clips along the pelvic sidewalls and retroperitoneum. Scattered atherosclerotic  calcification of the aorta and its branches.  Bilateral L3 transverse process fractures.  L1 compression fracture with 30% height loss.  Associated sclerosis.  An underlying lesion is not excluded.  IMPRESSION: Right lower lung opacity; atelectasis versus infiltrate.  Cirrhotic liver morphology with stigmata of portal hypertension including splenomegaly and para esophageal varices.  Note that the study is not optimized to evaluate for hepatocellular carcinoma and a surveillance MRI is recommended.  Cholelithiasis without CT evidence for cholecystitis.  Fractures of the L3 transverse processes and compression fracture of the L1 vertebral body with 30% height loss. Correlate with point tenderness and trauma history.  Given the sclerotic appearance of L1, cannot exclude an underlying lesion by CT.  Recommend correlation with outside imaging and MR if warranted.  Left sided bladder wall thickening without focal lesion.  Correlate with urinalysis and cystoscopy if warranted.  Discussed via telephone with Dr. Hyacinth Meeker at 05:20 a.m. on 10/17/2011.  Original Report Authenticated By: Waneta Martins, M.D.    @ROS @ Blood pressure 130/72, pulse 80, temperature 98.7 F (37.1 C), temperature source Oral, resp. rate 16, SpO2 94.00%. General appearance: alert, cooperative and no distress Head: Normocephalic, without  obvious abnormality, atraumatic Neck: no adenopathy and supple, symmetrical, trachea midline Resp: clear to auscultation bilaterally Chest wall: no tenderness Cardio: normal rate, regular rhythm GI: soft, nontender on exam now, ND, no masses, lower transverse incision without hernia Extremities: extremities normal, atraumatic, no cyanosis or edema Neurologic: Grossly normal  Assessment/Plan: Upper abdominal pain  This could be some choledocholithiasis given her CT scan of the location of demonstrating cholelithiasis however there is no evidence of acute cholecystitis. She is nontender on exam today  during my exam. This could also represent peptic ulcer disease and she does take ibuprofen daily and has a history of peptic ulcers versus recurrent cervical cancer and metastatic disease. She does have elevated LFTs which again could do do to her symptomatic cholelithiasis. She does have a history of cirrhosis and varices and certainly we will to rule out other causes of her discomfort such as peptic ulcer disease and palpitations from her cervical cancer prior to any elective cholecystectomy.again, she is pain-free on exam today. I recommend upper endoscopy for evaluation of the stomach and varices as well as GYN oncology evaluation for followup and evaluation of her cervical cancer. If the evaluations are negative and we would consider elective cholecystectomy for possible improvement of her quadrant abdominal discomfort.  Lodema Pilot DAVID 10/17/2011, 6:25 AM

## 2011-10-17 NOTE — ED Notes (Signed)
Surgeon in to see pt 

## 2011-10-17 NOTE — H&P (Addendum)
[name] [name]:[id number],MRN:[id number]  Outpatient Primary MD for the patient is No primary provider on file.  With History of -  Past Medical History  Diagnosis Date  . Cirrhosis   . Hepatitis C   . COPD (chronic obstructive pulmonary disease)   . Heart palpitations   . cervical Cancer     cervical  . Anxiety       Past Surgical History  Procedure Date  . Cervical cancer surgery   . Abdominal hysterectomy     in for   Chief Complaint  Patient presents with  . Back Pain  . Abdominal Pain  . Constipation  . Nausea  . Emesis     HPI  [name] [name]  is a 50 y.o. female,  patient with history of hepatitis C, intermittent alcohol use, history of cervical cancer which is followed as outpatient currently in remission per patient. According to patient she's been having intermittent right upper quadrant pain for years, however she says the pain was little worse over the last few weeks, she went to see her physician at health service, where physician checked her blood numbers, and thought that patient had gallstones along with cirrhosis and patient was sent to the ER for evaluation of the same. He should in the ER was seen by general surgery who recommended internal medicine admission.  Patient denies any recent back injuries, denies any back pain, her appetite is good, denies any chest pain cough shortness of breath, denies any palpitations, her right upper quadrant pain is intermittent when it comes it is sharp sometimes radiates to her back no aggravating or relieving factors no associated symptoms.    Review of Systems    In addition to the HPI above,   No Fever-chills, No Headache, No changes with Vision or hearing, No problems swallowing food or Liquids, No Chest pain, Cough or Shortness of Breath, RUQ Abdominal pain, No Nausea or Vommitting, Bowel movements are regular, No Blood in stool or Urine, No dysuria, No new skin rashes or bruises, No new joints  pains-aches,  No new weakness, tingling, numbness in any extremity, No recent weight gain or loss, No polyuria, polydypsia or polyphagia, No significant Mental Stressors.  A full 10 point Review of Systems was done, except as stated above, all other Review of Systems were negative.   Social History History  Substance Use Topics  . Smoking status: Current Everyday Smoker    Types: Cigarettes  . Smokeless tobacco: Not on file  . Alcohol Use: Yes     occasionally      Family History Cirrhosis in mother  Prior to Admission medications   Medication Sig Start Date End Date Taking? Authorizing Provider  doxepin (SINEQUAN) 25 MG capsule Take 50 mg by mouth 4 (four) times daily.     Yes Historical Provider, MD  fluconazole (DIFLUCAN) 150 MG tablet Take 150 mg by mouth every 7 (seven) days.     Yes Historical Provider, MD  ibuprofen (ADVIL,MOTRIN) 200 MG tablet Take 200 mg by mouth every 6 (six) hours as needed. Pain relief    Yes Historical Provider, MD    No Known Allergies  Physical Exam No intake or output data in the 24 hours ending 10/17/11 0835 Blood pressure 134/78, pulse 82, temperature 98.4 F (36.9 C), temperature source Oral, resp. rate 16, SpO2 96.00%.   1. General middle aged female lying in bed in NAD,    2. Normal affect and insight, Not Suicidal or Homicidal, Awake Alert, Oriented *  3.  3. No F.N deficits, ALL C.Nerves Intact, Strength 5/5 all 4 extremities, Sensation intact all 4 extremities, Plantars down going.  4. Ears and Eyes appear Normal, Conjunctivae clear, PERRLA. Moist Oral Mucosa.  5. Supple Neck, No JVD, No cervical lymphadenopathy appriciated, No Carotid Bruits.  6. Symmetrical Chest wall movement, Good air movement bilaterally, CTAB.  7. RRR, No Gallops, Rubs or Murmurs, No Parasternal Heave.  8. Positive Bowel Sounds, Abdomen Soft, Non tender, No organomegaly appriciated,       No rebound -guarding or rigidity.  9.  No Cyanosis, Normal Skin  Turgor, No Skin Rash or Bruise.  10. Good muscle tone,  joints appear normal , no effusions, Normal ROM. No lower spine tenderness good ROM.  11. No Palpable Lymph Nodes in Neck or Axillae    Data Review  CBC  Lab 10/17/11 0055 10/17/11 0040  WBC -- 7.5  HGB 14.6 13.8  HCT 43.0 39.4  PLT -- 100*  MCV -- 99.7  MCH -- 34.9*  MCHC -- 35.0  RDW -- 15.1  LYMPHSABS -- 3.2  MONOABS -- 1.1*  EOSABS -- 0.2  BASOSABS -- 0.1  BANDABS -- --   ------------------------------------------------------------------------------------------------------------------ Chemistries   Lab 10/17/11 0055 10/17/11 0040  NA 144 139  K 3.6 3.6  CL 105 104  CO2 -- 24  GLUCOSE 105* 109*  BUN 3* 6  CREATININE 0.70 0.54  CALCIUM -- 9.5  MG -- --  AST -- 166*  ALT -- 88*  ALKPHOS -- 182*  BILITOT -- 1.4*   ------------------------------------------------------------------------------------------------------------------ CrCl is unknown because there is no height on file for the current visit. ------------------------------------------------------------------------------------------------------------------ No results found for this basename: TSH,T4TOTAL,FREET3,T3FREE,THYROIDAB in the last 72 hours  Coagulation profile  Lab 10/17/11 0040  INR 1.18  PROTIME --   ------------------------------------------------------------------------------------------------------------------- No results found for this basename: DDIMER:2 in the last 72 hours ------------------------------------------------------------------------------------------------------------------- Cardiac Enzymes No results found for this basename: CK:3,CKMB:3,TROPONINI:3,MYOGLOBIN:3 in the last 168 hours ------------------------------------------------------------------------------------------------------------------ No components found with this basename:  POCBNP:3 ------------------------------------------------------------------------------------------------------------------  Imaging results:   Dg Chest 2 View  10/17/2011  *RADIOLOGY REPORT*  Clinical Data: Shortness of breath  CHEST - 2 VIEW  Comparison: None.  Findings: Mild hyperinflation.  Interstitial prominence. No focal consolidation.  No pleural effusion or pneumothorax. Cardiomediastinal contours are within normal limits. There is a anterior compression fracture of a lower thoracic vertebral body with approximately 25% height loss,, age indeterminate.  IMPRESSION: Interstitial prominence without focal consolidation.  Compression fracture of a lower thoracic vertebral body, age indeterminate.  Original Report Authenticated By: Waneta Martins, M.D.   Ct Abdomen Pelvis W Contrast  10/17/2011  *RADIOLOGY REPORT*  Clinical Data: Abdominal pain, elevated lactate.  CT ABDOMEN AND PELVIS WITH CONTRAST  Technique:  Multidetector CT imaging of the abdomen and pelvis was performed following the standard protocol during bolus administration of intravenous contrast.  Contrast: OMNIPAQUE IOHEXOL 300 MG/ML IV SOLN  Comparison: None.  Findings: Right lower lobe consolidation.  No pleural or pericardial effusion.  Low attenuation of the liver with lobular contour.  No focal abnormality however note that the study is not optimized to evaluate for hepatic cellular carcinoma.  Cholelithiasis.  No gallbladder wall thickening or pericholecystic fluid.  No biliary ductal dilatation.  Splenomegaly.  Unremarkable pancreas and adrenal glands.  Symmetric renal enhancement.  No hydronephrosis or hydroureter. Prominent porta hepatis lymph nodes.  No bowel obstruction.  No CT evidence for colitis.  Normal appendix.  No free intraperitoneal air or  fluid.  Patent portal vein.  Paresophageal varices.  Mild asymmetric bladder wall thickening on the left.  Surgical clips along the pelvic sidewalls and retroperitoneum.  Scattered atherosclerotic calcification of the aorta and its branches.  Bilateral L3 transverse process fractures.  L1 compression fracture with 30% height loss.  Associated sclerosis.  An underlying lesion is not excluded.    IMPRESSION: Right lower lung opacity; atelectasis versus infiltrate.  Cirrhotic liver morphology with stigmata of portal hypertension including splenomegaly and para esophageal varices.  Note that the study is not optimized to evaluate for hepatocellular carcinoma and a surveillance MRI is recommended.  Cholelithiasis without CT evidence for cholecystitis.  Fractures of the L3 transverse processes and compression fracture of the L1 vertebral body with 30% height loss. Correlate with point tenderness and trauma history.  Given the sclerotic appearance of L1, cannot exclude an underlying lesion by CT.  Recommend correlation with outside imaging and MR if warranted.  Left sided bladder wall thickening without focal lesion.  Correlate with urinalysis and cystoscopy if warranted.  Discussed via telephone with Dr. Hyacinth Meeker at 05:20 a.m. on 10/17/2011.  Original Report Authenticated By: Waneta Martins, M.D.   Assessment & Plan  #1. RUQ Abd Pain - sub acute, H/O Hep C, Intermittent Alcohol Use, suspicion of acute cholecystitis, ?? of hepatocellular carcinoma on CT scan with elevation of liver enzymes- I think these problems are more subacute to chronic, I highly doubt patient's compliance, will admit the patient and order CEA and alpha-fetoprotein, have discussed the case with a [facility] to see the patient shortly, I do not think patient has any signs of acute cholecystitis surgery is already seeing the patient from this standpoint, there is a question of hepatocellular carcinoma on the CT scan, GI will evaluate the patient for this suspicion and for history of hepatitis C and cirrhosis.  #2. History of cervical cancer will defer it for outpatient followup patient says she would like to  followup with her own physicians.  #3. Bladder wall thickening on CT scan. Will order a UA with urine cytology, patient most likely will need outpatient urology followup for cystoscopy etc.  #4. L1 and L3 fractures noted on CT scans. These likely a chronic patient has no pain or discomfort in that area she has no tenderness on palpation, she has no history of recent fall or injury to that site. She did say that about a year ago she had a fall and pain to her lower back, will have her follow with neurosurgery outpatient.D/W Dr Jordan Likes outpt follow, with outpt MRI.  #5. History of smoking and intermittent alcohol use have counseled the patient to quit both.  #6. Thrombocytopenia likely chronic from hepatitis C and undiagnosed chronic cirrhosis no signs of bleeding we'll monitor.  #7. Remote history of palpitations no acute issues we'll monitor on tele.  #8. History of COPD no acute issues when necessary nebulizers and oxygen as needed.   DVT Prophylaxis SCDs    AM Labs Ordered, also please review Full Orders  Admission, patients condition and plan of care including tests being ordered have been discussed with the patient   who indicates understanding and agree with the plan and Code Status.  Code Status Full  Condition Verneda Skill M.D 10/17/2011, 8:35 AM  [facility] Office  [phone]

## 2011-10-17 NOTE — Plan of Care (Signed)
Problem: Consults Goal: General Medical Patient Education See Patient Education Module for specific education. Outcome: Progressing Admitted to room 1516.

## 2011-10-17 NOTE — Progress Notes (Signed)
Subjective: Pt sitting up in bed, no pain currently.  Objective: Vital signs in last 24 hours: Temp:  [98.3 F (36.8 C)-98.8 F (37.1 C)] 98.4 F (36.9 C) (12/20 0721) Pulse Rate:  [68-115] 68  (12/20 1144) Resp:  [16-20] 16  (12/20 0420) BP: (114-153)/(60-95) 128/66 mmHg (12/20 1144) SpO2:  [91 %-96 %] 91 % (12/20 1144)    Intake/Output from previous day:   Intake/Output this shift:    General appearance: alert, cooperative and no distress GI: soft, non-tender; bowel sounds normal; no masses,  no organomegaly  Lab Results:   Basename 10/17/11 0055 10/17/11 0040  WBC -- 7.5  HGB 14.6 13.8  HCT 43.0 39.4  PLT -- 100*    BMET  Basename 10/17/11 0055 10/17/11 0040  NA 144 139  K 3.6 3.6  CL 105 104  CO2 -- 24  GLUCOSE 105* 109*  BUN 3* 6  CREATININE 0.70 0.54  CALCIUM -- 9.5   PT/INR  Basename 10/17/11 0942 10/17/11 0040  LABPROT 15.2 15.3*  INR 1.18 1.18     Studies/Results: Dg Chest 2 View  10/17/2011  *RADIOLOGY REPORT*  Clinical Data: Shortness of breath  CHEST - 2 VIEW  Comparison: None.  Findings: Mild hyperinflation.  Interstitial prominence. No focal consolidation.  No pleural effusion or pneumothorax. Cardiomediastinal contours are within normal limits. There is a anterior compression fracture of a lower thoracic vertebral body with approximately 25% height loss,, age indeterminate.  IMPRESSION: Interstitial prominence without focal consolidation.  Compression fracture of a lower thoracic vertebral body, age indeterminate.  Original Report Authenticated By: Waneta Martins, M.D.   Ct Abdomen Pelvis W Contrast  10/17/2011  *RADIOLOGY REPORT*  Clinical Data: Abdominal pain, elevated lactate.  CT ABDOMEN AND PELVIS WITH CONTRAST  Technique:  Multidetector CT imaging of the abdomen and pelvis was performed following the standard protocol during bolus administration of intravenous contrast.  Contrast: OMNIPAQUE IOHEXOL 300 MG/ML IV SOLN   Comparison: None.  Findings: Right lower lobe consolidation.  No pleural or pericardial effusion.  Low attenuation of the liver with lobular contour.  No focal abnormality however note that the study is not optimized to evaluate for hepatic cellular carcinoma.  Cholelithiasis.  No gallbladder wall thickening or pericholecystic fluid.  No biliary ductal dilatation.  Splenomegaly.  Unremarkable pancreas and adrenal glands.  Symmetric renal enhancement.  No hydronephrosis or hydroureter. Prominent porta hepatis lymph nodes.  No bowel obstruction.  No CT evidence for colitis.  Normal appendix.  No free intraperitoneal air or fluid.  Patent portal vein.  Paresophageal varices.  Mild asymmetric bladder wall thickening on the left.  Surgical clips along the pelvic sidewalls and retroperitoneum. Scattered atherosclerotic calcification of the aorta and its branches.  Bilateral L3 transverse process fractures.  L1 compression fracture with 30% height loss.  Associated sclerosis.  An underlying lesion is not excluded.  IMPRESSION: Right lower lung opacity; atelectasis versus infiltrate.  Cirrhotic liver morphology with stigmata of portal hypertension including splenomegaly and para esophageal varices.  Note that the study is not optimized to evaluate for hepatocellular carcinoma and a surveillance MRI is recommended.  Cholelithiasis without CT evidence for cholecystitis.  Fractures of the L3 transverse processes and compression fracture of the L1 vertebral body with 30% height loss. Correlate with point tenderness and trauma history.  Given the sclerotic appearance of L1, cannot exclude an underlying lesion by CT.  Recommend correlation with outside imaging and MR if warranted.  Left sided bladder wall thickening without focal  lesion.  Correlate with urinalysis and cystoscopy if warranted.  Discussed via telephone with Dr. Hyacinth Meeker at 05:20 a.m. on 10/17/2011.  Original Report Authenticated By: Waneta Martins, M.D.     Anti-infectives: Anti-infectives     Start     Dose/Rate Route Frequency Ordered Stop   10/17/11 0915   fluconazole (DIFLUCAN) tablet 150 mg        150 mg Oral Every 7 days 10/17/11 0913           Current Facility-Administered Medications  Medication Dose Route Frequency Provider Last Rate Last Dose  . 0.9 %  sodium chloride infusion   Intravenous STAT Vida Roller, MD 150 mL/hr at 10/17/11 1026    . albuterol (PROVENTIL) (5 MG/ML) 0.5% nebulizer solution 2.5 mg  2.5 mg Nebulization Q2H PRN Leroy Sea, MD      . calcium gluconate 1 g in sodium chloride 0.9 % 100 mL IVPB  1 g Intravenous Once Leroy Sea, MD   1 g at 10/17/11 1314  . doxepin (SINEQUAN) capsule 50 mg  50 mg Oral QID Leroy Sea, MD   50 mg at 10/17/11 1026  . fluconazole (DIFLUCAN) tablet 150 mg  150 mg Oral Q7 days Leroy Sea, MD   150 mg at 10/17/11 1026  . guaiFENesin-dextromethorphan (ROBITUSSIN DM) 100-10 MG/5ML syrup 5 mL  5 mL Oral Q4H PRN Leroy Sea, MD      . HYDROcodone-acetaminophen (NORCO) 5-325 MG per tablet 1-2 tablet  1-2 tablet Oral Q4H PRN Leroy Sea, MD      . iohexol (OMNIPAQUE) 300 MG/ML solution 100 mL  100 mL Intravenous Once PRN Medication Radiologist   100 mL at 10/17/11 0455  . LORazepam (ATIVAN) tablet 0.5 mg  0.5 mg Oral BID Leroy Sea, MD   0.5 mg at 10/17/11 1026  . ondansetron (ZOFRAN) tablet 4 mg  4 mg Oral Q6H PRN Leroy Sea, MD       Or  . ondansetron (ZOFRAN) injection 4 mg  4 mg Intravenous Q6H PRN Leroy Sea, MD      . sodium chloride 0.9 % bolus 1,000 mL  1,000 mL Intravenous STAT Vida Roller, MD   1,000 mL at 10/17/11 0037  . sodium chloride 0.9 % bolus 1,000 mL  1,000 mL Intravenous Once Vida Roller, MD   1,000 mL at 10/17/11 0119   Current Outpatient Prescriptions  Medication Sig Dispense Refill  . doxepin (SINEQUAN) 25 MG capsule Take 50 mg by mouth 4 (four) times daily.        . fluconazole (DIFLUCAN) 150 MG tablet  Take 150 mg by mouth every 7 (seven) days.        Marland Kitchen ibuprofen (ADVIL,MOTRIN) 200 MG tablet Take 200 mg by mouth every 6 (six) hours as needed. Pain relief         Assessment/Plan RUQ pain with cirrhosis, hepatitis C, ongoing Etoh use, Cholelithiasis with no gallbladder thickening,pericholecystic fluid, or ductal dilatation. Ongoing NSAID use/ ? GI source/hx PUD COPD Hx cervical cancer and hysterectomy  Plan:  Agree with Dr. Christella Hartigan plan.  Will follow for now.   LOS: 1 day    Zaccheus Edmister 10/17/2011

## 2011-10-17 NOTE — ED Notes (Signed)
Returned from CT.

## 2011-10-18 ENCOUNTER — Encounter (HOSPITAL_COMMUNITY)
Admission: EM | Disposition: A | Discharge status: Home / Self Care | Payer: Self-pay | Source: Home / Self Care | Attending: Internal Medicine

## 2011-10-18 ENCOUNTER — Encounter (HOSPITAL_COMMUNITY): Payer: Self-pay | Admitting: *Deleted

## 2011-10-18 DIAGNOSIS — K2970 Gastritis, unspecified, without bleeding: Secondary | ICD-10-CM

## 2011-10-18 DIAGNOSIS — K2990 Gastroduodenitis, unspecified, without bleeding: Secondary | ICD-10-CM

## 2011-10-18 HISTORY — PX: ESOPHAGOGASTRODUODENOSCOPY: SHX5428

## 2011-10-18 LAB — COMPREHENSIVE METABOLIC PANEL
AST: 121 U/L — ABNORMAL HIGH (ref 0–37)
Albumin: 2.7 g/dL — ABNORMAL LOW (ref 3.5–5.2)
BUN: 8 mg/dL (ref 6–23)
CO2: 22 mEq/L (ref 19–32)
Calcium: 8.5 mg/dL (ref 8.4–10.5)
Creatinine, Ser: 0.56 mg/dL (ref 0.50–1.10)
GFR calc non Af Amer: >90 mL/min (ref >90)

## 2011-10-18 LAB — CBC
MCH: 34.7 pg — ABNORMAL HIGH (ref 26.0–34.0)
MCV: 101.4 fL — ABNORMAL HIGH (ref 78.0–100.0)
Platelets: 74 10*3/uL — ABNORMAL LOW (ref 150–400)
RDW: 15 % (ref 11.5–15.5)

## 2011-10-18 LAB — URINE MICROSCOPIC-ADD ON

## 2011-10-18 LAB — URINALYSIS, ROUTINE W REFLEX MICROSCOPIC
Nitrite: NEGATIVE
Protein, ur: NEGATIVE mg/dL
pH: 6.5 (ref 5.0–8.0)

## 2011-10-18 SURGERY — EGD (ESOPHAGOGASTRODUODENOSCOPY)
Anesthesia: Moderate Sedation

## 2011-10-18 MED ORDER — SODIUM CHLORIDE 0.9 % IV SOLN: 250.0000 mL | INTRAVENOUS | Status: DC | PRN

## 2011-10-18 MED ORDER — MIDAZOLAM HCL 10 MG/2ML IJ SOLN
INTRAMUSCULAR | Status: DC | PRN
  Administered 2011-10-18 (×2): 2 mg via INTRAVENOUS

## 2011-10-18 MED ORDER — PANTOPRAZOLE SODIUM 40 MG PO TBEC
40.0000 mg | DELAYED_RELEASE_TABLET | Freq: Every day | ORAL | Status: DC
  Administered 2011-10-18 – 2011-10-19 (×2): 40 mg via ORAL
  Filled 2011-10-18 (×2): qty 1

## 2011-10-18 MED ORDER — SODIUM CHLORIDE 0.9 % IJ SOLN: 3.0000 mL | INTRAMUSCULAR | Status: DC | PRN

## 2011-10-18 MED ORDER — NADOLOL 20 MG PO TABS
20.0000 mg | ORAL_TABLET | Freq: Every day | ORAL | Status: DC
  Administered 2011-10-18 – 2011-10-19 (×2): 20 mg via ORAL
  Filled 2011-10-18 (×2): qty 1

## 2011-10-18 MED ORDER — SODIUM CHLORIDE 0.9 % IJ SOLN: 3.0000 mL | Freq: Two times a day (BID) | INTRAMUSCULAR | Status: DC

## 2011-10-18 MED ORDER — BUTAMBEN-TETRACAINE-BENZOCAINE 2-2-14 % EX AERO
INHALATION_SPRAY | CUTANEOUS | Status: DC | PRN
  Administered 2011-10-18: 2 via TOPICAL

## 2011-10-18 MED ORDER — FENTANYL NICU IV SYRINGE 50 MCG/ML
INJECTION | INTRAMUSCULAR | Status: DC | PRN
  Administered 2011-10-18 (×2): 25 ug via INTRAVENOUS

## 2011-10-18 MED ORDER — SODIUM CHLORIDE 0.9 % IV SOLN
INTRAVENOUS | Status: DC
  Administered 2011-10-18: 500 mL via INTRAVENOUS

## 2011-10-18 MED ORDER — POTASSIUM CHLORIDE 20 MEQ/15ML (10%) PO LIQD
40.0000 meq | Freq: Every day | ORAL | Status: DC
  Administered 2011-10-18 – 2011-10-19 (×2): 40 meq via ORAL
  Filled 2011-10-18 (×2): qty 30

## 2011-10-18 MED ORDER — DIPHENHYDRAMINE HCL 50 MG/ML IJ SOLN
INTRAMUSCULAR | Status: DC | PRN
  Administered 2011-10-18: 25 mg via INTRAVENOUS

## 2011-10-18 MED ORDER — DIPHENHYDRAMINE HCL 50 MG/ML IJ SOLN
INTRAMUSCULAR | Status: AC
  Filled 2011-10-18: qty 1

## 2011-10-18 NOTE — Progress Notes (Signed)
[facility] Attending:  Multiple possible etiologies for RUQ pain.  Known cholelithiasis.  High risk for operative intervention.  Will follow as medical Rx continues.  Velora Heckler, MD, FACS General & Endocrine Surgery [facility]

## 2011-10-18 NOTE — Progress Notes (Signed)
UR COMPLETED  

## 2011-10-18 NOTE — Progress Notes (Signed)
EGD completed, see full report in procedure section.  Once daily PPI and nadolol started.  She will need to call my office [phone] for follow up appointment after discharge (in 3-4 weeks).  Please call or page with any further questions or concerns.

## 2011-10-18 NOTE — Progress Notes (Signed)
Arryana Leedy CSN:[id number],MRN:[id number] is a 50 y.o. female,  Outpatient Primary MD for the patient is No primary provider on file.  Chief Complaint  Patient presents with  . Back Pain  . Abdominal Pain  . Constipation  . Nausea  . Emesis        Subjective:   Kiasia Shidler today has, No headache, No chest pain, No abdominal pain - No Nausea, No new weakness tingling or numbness, No Cough - SOB.   Objective:   Filed Vitals:   10/18/11 1100 10/18/11 1110 10/18/11 1120 10/18/11 1152  BP: 108/74 118/71 124/75   Pulse:    54  Temp:      TempSrc:      Resp: 18 19 18 18   Height:      Weight:      SpO2: 96% 94% 92% 94%    Wt Readings from Last 3 Encounters:  10/18/11 65.3 kg (143 lb 15.4 oz)  10/18/11 65.3 kg (143 lb 15.4 oz)     Intake/Output Summary (Last 24 hours) at 10/18/11 1356 Last data filed at 10/18/11 1300  Gross per 24 hour  Intake    240 ml  Output      0 ml  Net    240 ml    Exam Awake Alert, Oriented *3, No new F.N deficits, Normal affect [place].AT,PERRAL Supple Neck,No JVD, No cervical lymphadenopathy appriciated.  Symmetrical Chest wall movement, Good air movement bilaterally, CTAB RRR,No Gallops,Rubs or new Murmurs, No Parasternal Heave +ve B.Sounds, Abd Soft, Non tender, No organomegaly appriciated, No rebound -guarding or rigidity. No Cyanosis, Clubbing or edema, No new Rash or bruise    Data Review  CBC  Lab 10/18/11 0515 10/17/11 0055 10/17/11 0040  WBC 4.4 -- 7.5  HGB 12.6 14.6 13.8  HCT 36.8 43.0 39.4  PLT 74* -- 100*  MCV 101.4* -- 99.7  MCH 34.7* -- 34.9*  MCHC 34.2 -- 35.0  RDW 15.0 -- 15.1  LYMPHSABS -- -- 3.2  MONOABS -- -- 1.1*  EOSABS -- -- 0.2  BASOSABS -- -- 0.1  BANDABS -- -- --    Chemistries   Lab 10/18/11 0515 10/17/11 0055 10/17/11 0040  NA 139 144 139  K 3.4* 3.6 3.6  CL 106 105 104  CO2 22 -- 24  GLUCOSE 78 105* 109*  BUN 8 3* 6  CREATININE 0.56 0.70 0.54  CALCIUM 8.5 -- 9.5  MG -- -- --  AST 121* -- 166*    ALT 63* -- 88*  ALKPHOS 127* -- 182*  BILITOT 4.1* -- 1.4*   ------------------------------------------------------------------------------------------------------------------ estimated creatinine clearance is 71 ml/min (by C-G formula based on Cr of 0.56). ------------------------------------------------------------------------------------------------------------------ No results found for this basename: HGBA1C:2 in the last 72 hours ------------------------------------------------------------------------------------------------------------------ No results found for this basename: CHOL:2,HDL:2,LDLCALC:2,TRIG:2,CHOLHDL:2,LDLDIRECT:2 in the last 72 hours ------------------------------------------------------------------------------------------------------------------ No results found for this basename: TSH,T4TOTAL,FREET3,T3FREE,THYROIDAB in the last 72 hours ------------------------------------------------------------------------------------------------------------------ No results found for this basename: VITAMINB12:2,FOLATE:2,FERRITIN:2,TIBC:2,IRON:2,RETICCTPCT:2 in the last 72 hours  Coagulation profile  Lab 10/17/11 0942 10/17/11 0040  INR 1.18 1.18  PROTIME -- --    No results found for this basename: DDIMER:2 in the last 72 hours  Cardiac Enzymes No results found for this basename: CK:3,CKMB:3,TROPONINI:3,MYOGLOBIN:3 in the last 168 hours ------------------------------------------------------------------------------------------------------------------ No components found with this basename: POCBNP:3  Micro Results No results found for this or any previous visit (from the past 240 hour(s)).  Radiology Reports Dg Chest 2 View  10/17/2011  *RADIOLOGY REPORT*  Clinical Data: Shortness of breath  CHEST - 2 VIEW  Comparison: None.  Findings: Mild hyperinflation.  Interstitial prominence. No focal consolidation.  No pleural effusion or pneumothorax. Cardiomediastinal contours are  within normal limits. There is a anterior compression fracture of a lower thoracic vertebral body with approximately 25% height loss,, age indeterminate.  IMPRESSION: Interstitial prominence without focal consolidation.  Compression fracture of a lower thoracic vertebral body, age indeterminate.  Original Report Authenticated By: Waneta Martins, M.D.   Ct Abdomen Pelvis W Contrast  10/17/2011  *RADIOLOGY REPORT*  Clinical Data: Abdominal pain, elevated lactate.  CT ABDOMEN AND PELVIS WITH CONTRAST  Technique:  Multidetector CT imaging of the abdomen and pelvis was performed following the standard protocol during bolus administration of intravenous contrast.  Contrast: OMNIPAQUE IOHEXOL 300 MG/ML IV SOLN  Comparison: None.  Findings: Right lower lobe consolidation.  No pleural or pericardial effusion.  Low attenuation of the liver with lobular contour.  No focal abnormality however note that the study is not optimized to evaluate for hepatic cellular carcinoma.  Cholelithiasis.  No gallbladder wall thickening or pericholecystic fluid.  No biliary ductal dilatation.  Splenomegaly.  Unremarkable pancreas and adrenal glands.  Symmetric renal enhancement.  No hydronephrosis or hydroureter. Prominent porta hepatis lymph nodes.  No bowel obstruction.  No CT evidence for colitis.  Normal appendix.  No free intraperitoneal air or fluid.  Patent portal vein.  Paresophageal varices.  Mild asymmetric bladder wall thickening on the left.  Surgical clips along the pelvic sidewalls and retroperitoneum. Scattered atherosclerotic calcification of the aorta and its branches.  Bilateral L3 transverse process fractures.  L1 compression fracture with 30% height loss.  Associated sclerosis.  An underlying lesion is not excluded.  IMPRESSION: Right lower lung opacity; atelectasis versus infiltrate.  Cirrhotic liver morphology with stigmata of portal hypertension including splenomegaly and para esophageal varices.  Note that  the study is not optimized to evaluate for hepatocellular carcinoma and a surveillance MRI is recommended.  Cholelithiasis without CT evidence for cholecystitis.  Fractures of the L3 transverse processes and compression fracture of the L1 vertebral body with 30% height loss. Correlate with point tenderness and trauma history.  Given the sclerotic appearance of L1, cannot exclude an underlying lesion by CT.  Recommend correlation with outside imaging and MR if warranted.  Left sided bladder wall thickening without focal lesion.  Correlate with urinalysis and cystoscopy if warranted.  Discussed via telephone with Dr. Hyacinth Meeker at 05:20 a.m. on 10/17/2011.  Original Report Authenticated By: Waneta Martins, M.D.    Scheduled Meds:   . sodium chloride   Intravenous STAT  . doxepin  50 mg Oral QID  . fluconazole  150 mg Oral Q7 days  . LORazepam  0.5 mg Oral BID  . nadolol  20 mg Oral Daily  . pantoprazole  40 mg Oral Q1200  . potassium chloride  40 mEq Oral Daily  . DISCONTD: sodium chloride   Intravenous Once  . DISCONTD: sodium chloride  3 mL Intravenous Q12H   Continuous Infusions:   . DISCONTD: sodium chloride 500 mL (10/18/11 0919)   PRN Meds:.albuterol, guaiFENesin-dextromethorphan, HYDROcodone-acetaminophen, ondansetron (ZOFRAN) IV, ondansetron, DISCONTD: sodium chloride, DISCONTD: butamben-tetracaine-benzocaine, DISCONTD: diphenhydrAMINE, DISCONTD: fentaNYL, DISCONTD: midazolam, DISCONTD: sodium chloride  Assessment & Plan   #1. RUQ Abd Pain - sub acute, H/O Hep C, Intermittent Alcohol Use, suspicion of acute cholecystitis, ?? of hepatocellular carcinoma on CT scan with elevation of liver enzymes - pt seen by GI EGD has some Varices,reflux esophagitis, portal gastropathy on PPI  and Nodalol, her AFP is high and per GI they want outpt follow in 3-4 weeks.    #2. History of cervical cancer will defer it for outpatient followup patient says she would like to followup with her own  physicians.   #3. Bladder wall thickening on CT scan.   UA   is stable,  urine cytology, patient most likely will need outpatient urology followup for cystoscopy etc.she prefers outpt follow.   #4. L1 and L3 fractures noted on CT scans. These likely a chronic patient has no pain or discomfort in that area she has no tenderness on palpation, she has no history of recent fall or injury to that site. She did say that about a year ago she had a fall and pain to her lower back, will have her follow with neurosurgery outpatient.D/W Dr Jordan Likes outpt follow, with outpt MRI. D/W Dr pool over the phone he reviewed her scans on 09-17-11.  #5. History of smoking and intermittent alcohol use have counseled the patient to quit both.   #6. Thrombocytopenia likely chronic from hepatitis C and undiagnosed chronic cirrhosis no signs of bleeding we'll monitor.   #7. Remote history of palpitations no acute issues we'll monitor on tele.   #8. History of COPD no acute issues when necessary nebulizers and oxygen as needed.   DVT Prophylaxis   SCDs  See all Orders from today for further details     Leroy Sea M.D on 10/18/2011,at 1:56 PM  [facility] Office  [phone]

## 2011-10-18 NOTE — Progress Notes (Signed)
Day of Surgery  Subjective: Walking in room after endoscopy No pain currently it comes and goes.  Soft not tender on Exam now.    Objective: Vital signs in last 24 hours: Temp:  [97.9 F (36.6 C)-98.6 F (37 C)] 98 F (36.7 C) (12/21 0911) Pulse Rate:  [56-88] 68  (12/21 1040) Resp:  [15-22] 15  (12/21 1040) BP: (116-135)/(57-73) 130/64 mmHg (12/21 1040) SpO2:  [90 %-98 %] 95 % (12/21 1040) Weight:  [65.8 kg (145 lb 1 oz)] 145 lb 1 oz (65.8 kg) (12/20 1618) Last BM Date: 10/17/11  Intake/Output from previous day:   Intake/Output this shift:    General appearance: alert, cooperative and no distress GI: soft, non-tender; bowel sounds normal; no masses,  no organomegaly  Lab Results:   Basename 10/18/11 0515 10/17/11 0055 10/17/11 0040  WBC 4.4 -- 7.5  HGB 12.6 14.6 --  HCT 36.8 43.0 --  PLT 74* -- 100*    BMET  Basename 10/18/11 0515 10/17/11 0055 10/17/11 0040  NA 139 144 --  K 3.4* 3.6 --  CL 106 105 --  CO2 22 -- 24  GLUCOSE 78 105* --  BUN 8 3* --  CREATININE 0.56 0.70 --  CALCIUM 8.5 -- 9.5   PT/INR  Basename 10/17/11 0942 10/17/11 0040  LABPROT 15.2 15.3*  INR 1.18 1.18     Studies/Results: Dg Chest 2 View  10/17/2011  *RADIOLOGY REPORT*  Clinical Data: Shortness of breath  CHEST - 2 VIEW  Comparison: None.  Findings: Mild hyperinflation.  Interstitial prominence. No focal consolidation.  No pleural effusion or pneumothorax. Cardiomediastinal contours are within normal limits. There is a anterior compression fracture of a lower thoracic vertebral body with approximately 25% height loss,, age indeterminate.  IMPRESSION: Interstitial prominence without focal consolidation.  Compression fracture of a lower thoracic vertebral body, age indeterminate.  Original Report Authenticated By: Waneta Martins, M.D.   Ct Abdomen Pelvis W Contrast  10/17/2011  *RADIOLOGY REPORT*  Clinical Data: Abdominal pain, elevated lactate.  CT ABDOMEN AND PELVIS WITH CONTRAST   Technique:  Multidetector CT imaging of the abdomen and pelvis was performed following the standard protocol during bolus administration of intravenous contrast.  Contrast: OMNIPAQUE IOHEXOL 300 MG/ML IV SOLN  Comparison: None.  Findings: Right lower lobe consolidation.  No pleural or pericardial effusion.  Low attenuation of the liver with lobular contour.  No focal abnormality however note that the study is not optimized to evaluate for hepatic cellular carcinoma.  Cholelithiasis.  No gallbladder wall thickening or pericholecystic fluid.  No biliary ductal dilatation.  Splenomegaly.  Unremarkable pancreas and adrenal glands.  Symmetric renal enhancement.  No hydronephrosis or hydroureter. Prominent porta hepatis lymph nodes.  No bowel obstruction.  No CT evidence for colitis.  Normal appendix.  No free intraperitoneal air or fluid.  Patent portal vein.  Paresophageal varices.  Mild asymmetric bladder wall thickening on the left.  Surgical clips along the pelvic sidewalls and retroperitoneum. Scattered atherosclerotic calcification of the aorta and its branches.  Bilateral L3 transverse process fractures.  L1 compression fracture with 30% height loss.  Associated sclerosis.  An underlying lesion is not excluded.  IMPRESSION: Right lower lung opacity; atelectasis versus infiltrate.  Cirrhotic liver morphology with stigmata of portal hypertension including splenomegaly and para esophageal varices.  Note that the study is not optimized to evaluate for hepatocellular carcinoma and a surveillance MRI is recommended.  Cholelithiasis without CT evidence for cholecystitis.  Fractures of the L3 transverse processes  and compression fracture of the L1 vertebral body with 30% height loss. Correlate with point tenderness and trauma history.  Given the sclerotic appearance of L1, cannot exclude an underlying lesion by CT.  Recommend correlation with outside imaging and MR if warranted.  Left sided bladder wall thickening  without focal lesion.  Correlate with urinalysis and cystoscopy if warranted.  Discussed via telephone with Dr. Hyacinth Meeker at 05:20 a.m. on 10/17/2011.  Original Report Authenticated By: Waneta Martins, M.D.    Anti-infectives: Anti-infectives     Start     Dose/Rate Route Frequency Ordered Stop   10/17/11 0915   fluconazole (DIFLUCAN) tablet 150 mg        150 mg Oral Every 7 days 10/17/11 0913           Current Facility-Administered Medications  Medication Dose Route Frequency Provider Last Rate Last Dose  . 0.45 % sodium chloride infusion   Intravenous Once Meredith Pel, NP      . 0.9 %  sodium chloride infusion   Intravenous STAT Vida Roller, MD 150 mL/hr at 10/17/11 1026    . 0.9 %  sodium chloride infusion   Intravenous Continuous Rob Bunting, MD 20 mL/hr at 10/18/11 0919 500 mL at 10/18/11 0919  . albuterol (PROVENTIL) (5 MG/ML) 0.5% nebulizer solution 2.5 mg  2.5 mg Nebulization Q2H PRN Leroy Sea, MD      . calcium gluconate 1 g in sodium chloride 0.9 % 100 mL IVPB  1 g Intravenous Once Leroy Sea, MD   1 g at 10/17/11 1314  . doxepin (SINEQUAN) capsule 50 mg  50 mg Oral QID Leroy Sea, MD   50 mg at 10/17/11 2200  . fluconazole (DIFLUCAN) tablet 150 mg  150 mg Oral Q7 days Leroy Sea, MD   150 mg at 10/17/11 1026  . guaiFENesin-dextromethorphan (ROBITUSSIN DM) 100-10 MG/5ML syrup 5 mL  5 mL Oral Q4H PRN Leroy Sea, MD      . HYDROcodone-acetaminophen (NORCO) 5-325 MG per tablet 1-2 tablet  1-2 tablet Oral Q4H PRN Leroy Sea, MD      . LORazepam (ATIVAN) tablet 0.5 mg  0.5 mg Oral BID Leroy Sea, MD   0.5 mg at 10/17/11 2337  . ondansetron (ZOFRAN) tablet 4 mg  4 mg Oral Q6H PRN Leroy Sea, MD       Or  . ondansetron (ZOFRAN) injection 4 mg  4 mg Intravenous Q6H PRN Leroy Sea, MD      . DISCONTD: butamben-tetracaine-benzocaine (CETACAINE) spray    PRN Rob Bunting, MD   2 spray at 10/18/11 1033  . DISCONTD:  diphenhydrAMINE (BENADRYL) injection    PRN Rob Bunting, MD   25 mg at 10/18/11 1033  . DISCONTD: fentaNYL NICU IV Syringe 50 mcg/mL    PRN Rob Bunting, MD   25 mcg at 10/18/11 1037  . DISCONTD: midazolam (VERSED) 10 MG/2ML injection    PRN Rob Bunting, MD   2 mg at 10/18/11 1037    Assessment/Plan RUQ pain with cirrhosis, hepatitis C, ongoing Etoh use, Cholelithiasis with no gallbladder thickening,pericholecystic fluid, or ductal dilatation. Ongoing NSAID use/ ? GI source/hx PUD COPD Hx cervical cancer and hysterectomy  Plan:  Endoscopy shows small esophageal varaces and  Esophagitis, portal gastropathy.  No abdominal tenderness. We would not plan cholecystectomy with above findings.     LOS: 2 days    Edie Vallandingham 10/18/2011

## 2011-10-18 NOTE — Interval H&P Note (Signed)
History and Physical Interval Note:  10/18/2011 10:23 AM  [name] [name]  has presented today for surgery, with the diagnosis of esophageal varices  The various methods of treatment have been discussed with the patient and family. After consideration of risks, benefits and other options for treatment, the patient has consented to  Procedure(s): ESOPHAGOGASTRODUODENOSCOPY (EGD) as a surgical intervention .  The patients' history has been reviewed, patient examined, no change in status, stable for surgery.  I have reviewed the patients' chart and labs.  Questions were answered to the patient's satisfaction.     Rob Bunting

## 2011-10-18 NOTE — Op Note (Signed)
[facility] [address] [place], [place]  [address]  ENDOSCOPY PROCEDURE REPORT  PATIENT:  [name], [name]  MR#:  [id number] BIRTHDATE:  Dec 23, 1960, 50 yrs. old  GENDER:  female ENDOSCOPIST:  Rachael Fee, MD PROCEDURE DATE:  10/18/2011 PROCEDURE:  EGD, diagnostic 43235 ASA CLASS:  Class III INDICATIONS:  cirrhosis, abd pain MEDICATIONS:  Fentanyl 50 mcg IV, Versed 4 mg IV, Benadryl 25 mg IV TOPICAL ANESTHETIC:  Cetacaine Spray  DESCRIPTION OF PROCEDURE:   After the risks benefits and alternatives of the procedure were thoroughly explained, informed consent was obtained.  The  endoscope was introduced through the mouth and advanced to the second portion of the duodenum, without limitations.  The instrument was slowly withdrawn as the mucosa was fully examined. <<PROCEDUREIMAGES>> There were small distal esophagus varices (grade I) and also reflux esophagitis (short linear erosions at GE junction).  There were moderate changes of portal gastropathy throughout her stomach. No gastric varices (see image1, image2, image3, image4, and image8).  Otherwise the examination was normal (see image7). Retroflexed views revealed no abnormalities.    The scope was then withdrawn from the patient and the procedure completed. COMPLICATIONS:  None  ENDOSCOPIC IMPRESSION: 1) Small esophageal varices, reflux esophagitis, portal gastropathy (no gastric varices) 2) Otherwise normal examination  RECOMMENDATIONS: She was not taking PPI on admission but given the esophagitis and portal gastropathy she should continue on once daily PPI at discharge. I will start nadolol to help decrease portal pressures, decrease the chances of variceal bleeding. She will need to call my office to set up follow up after discharge in 3-4 weeks.  ______________________________ Rachael Fee, MD  n. eSIGNED:   Rachael Fee at 10/18/2011 10:50 AM  [name], [id number]

## 2011-10-18 NOTE — Progress Notes (Signed)
Nutrition Consult Brief Note  Consult secondary to difficulty swallowing Nutrition risk report  Chart reviewed.  Pt s/p EGD today showing esophageal varices, reflux esophagitis, portal gastropathy  Diet:  Heart Healthy.  Tolerated diet well with no difficulty swallowing and good intake.  BMI 24  Please consult if pt presents with further swallowing difficulty with current diet.  Oran Rein, RD [phone]

## 2011-10-19 LAB — COMPREHENSIVE METABOLIC PANEL
Albumin: 2.5 g/dL — ABNORMAL LOW (ref 3.5–5.2)
BUN: 8 mg/dL (ref 6–23)
Calcium: 8.2 mg/dL — ABNORMAL LOW (ref 8.4–10.5)
Chloride: 107 mEq/L (ref 96–112)
Creatinine, Ser: 0.53 mg/dL (ref 0.50–1.10)
Total Bilirubin: 1.8 mg/dL — ABNORMAL HIGH (ref 0.3–1.2)
Total Protein: 5.8 g/dL — ABNORMAL LOW (ref 6.0–8.3)

## 2011-10-19 MED ORDER — NITROFURANTOIN MACROCRYSTAL 100 MG PO CAPS
100.0000 mg | ORAL_CAPSULE | Freq: Four times a day (QID) | ORAL | Status: DC
  Administered 2011-10-19: 100 mg via ORAL
  Filled 2011-10-19 (×4): qty 1

## 2011-10-19 MED ORDER — NITROFURANTOIN MACROCRYSTAL 100 MG PO CAPS: 100.0000 mg | ORAL_CAPSULE | Freq: Four times a day (QID) | ORAL | Status: AC

## 2011-10-19 MED ORDER — LEVOFLOXACIN 500 MG PO TABS: 500.0000 mg | ORAL_TABLET | Freq: Every day | ORAL | Status: DC

## 2011-10-19 MED ORDER — PANTOPRAZOLE SODIUM 40 MG PO TBEC: 40.0000 mg | DELAYED_RELEASE_TABLET | Freq: Every day | ORAL | Status: DC

## 2011-10-19 MED ORDER — NADOLOL 20 MG PO TABS: 20.0000 mg | ORAL_TABLET | Freq: Every day | ORAL | Status: DC

## 2011-10-19 NOTE — Discharge Summary (Signed)
[name] [name], 50 y.o., DOB 1961-01-02, MRN [id number]. Admission date: 10/16/2011 Discharge Date 10/19/2011 Primary MD No primary provider on file. Admitting Physician Leroy Sea, MD  Admission Diagnosis  Abdominal pain [789.0] COPD (chronic obstructive pulmonary disease) [496] Anxiety [300.00] Cirrhosis [571.5] Cancer [199.1] (cervical Cancer) Thrombocytopenia [287.5] Hepatitis C [070.70] GALLBLADDER PROBLEMS  Discharge Diagnosis   Principal Problem:  *Abdominal pain Active Problems:  Cirrhosis  Hepatitis C  COPD (chronic obstructive pulmonary disease)  Anxiety  cervical Cancer      Past Medical History  Diagnosis Date  . Cirrhosis   . Hepatitis C   . COPD (chronic obstructive pulmonary disease)   . Heart palpitations   . cervical Cancer     cervical  . Anxiety     Past Surgical History  Procedure Date  . Cervical cancer surgery   . Abdominal hysterectomy   . Tubal ligation      Hospital Course See H&P, Labs, Consult and Test reports for all details in brief, patient was admitted for -  #1. Chronic RUQ Abd Pain with H/O Hep C, Intermittent Alcohol Use, - ?? of hepatocellular carcinoma on CT scan with elevation of liver enzymes likely chronic too, AFP is indeed elevated - pt seen by GI Dr Christella Hartigan who wants to follow pt as outpt, she did have  EGD  Showing some Varices, reflux esophagitis & portal gastropathy on PPI and Nodalol, must follow with GI closely.  #2. History of cervical cancer will defer it for outpatient followup patient says she would like to followup with her own physicians.   #3. Bladder wall thickening on CT scan. UA mild UTI, placed of Po ABX, please follow urine Culture results in 3-4 days, patient  will need outpatient urology followup for cystoscopy etc.she prefers outpt follow.   #4. L1 and L3 fractures noted on CT scans. This likely chronic as patient has no pain or discomfort in that area she has no tenderness on palpation, she has no  history of recent fall or injury to that site. She did say that about a year ago she had a fall and pain to her lower back, will have her follow with neurosurgery outpatient. D/W Dr Jordan Likes outpt follow, will need  outpt MRI. D/W Dr pool over the phone he reviewed her scans on 09-17-11.   #5. History of smoking and intermittent alcohol use have counseled the patient to quit both.   #6. Thrombocytopenia likely chronic from hepatitis C and undiagnosed chronic cirrhosis no signs of bleeding we'll monitor.   #7. Remote history of palpitations no acute issues stable on tele.   #8. History of COPD no acute issues when necessary nebulizers and oxygen as needed. Pox 93 on ambulation RA.    Consults  GI-jacobs, Pool - phone  Significant Tests:  See full reports for all details     Dg Chest 2 View  10/17/2011  *RADIOLOGY REPORT*  Clinical Data: Shortness of breath  CHEST - 2 VIEW  Comparison: None.  Findings: Mild hyperinflation.  Interstitial prominence. No focal consolidation.  No pleural effusion or pneumothorax. Cardiomediastinal contours are within normal limits. There is a anterior compression fracture of a lower thoracic vertebral body with approximately 25% height loss,, age indeterminate.  IMPRESSION: Interstitial prominence without focal consolidation.  Compression fracture of a lower thoracic vertebral body, age indeterminate.  Original Report Authenticated By: Waneta Martins, M.D.   Ct Abdomen Pelvis W Contrast  10/17/2011  *RADIOLOGY REPORT*  Clinical Data: Abdominal pain, elevated lactate.  CT ABDOMEN AND PELVIS WITH CONTRAST  Technique:  Multidetector CT imaging of the abdomen and pelvis was performed following the standard protocol during bolus administration of intravenous contrast.  Contrast: OMNIPAQUE IOHEXOL 300 MG/ML IV SOLN  Comparison: None.  Findings: Right lower lobe consolidation.  No pleural or pericardial effusion.  Low attenuation of the liver with lobular contour.  No  focal abnormality however note that the study is not optimized to evaluate for hepatic cellular carcinoma.  Cholelithiasis.  No gallbladder wall thickening or pericholecystic fluid.  No biliary ductal dilatation.  Splenomegaly.  Unremarkable pancreas and adrenal glands.  Symmetric renal enhancement.  No hydronephrosis or hydroureter. Prominent porta hepatis lymph nodes.  No bowel obstruction.  No CT evidence for colitis.  Normal appendix.  No free intraperitoneal air or fluid.  Patent portal vein.  Paresophageal varices.  Mild asymmetric bladder wall thickening on the left.  Surgical clips along the pelvic sidewalls and retroperitoneum. Scattered atherosclerotic calcification of the aorta and its branches.  Bilateral L3 transverse process fractures.  L1 compression fracture with 30% height loss.  Associated sclerosis.  An underlying lesion is not excluded.  IMPRESSION: Right lower lung opacity; atelectasis versus infiltrate.  Cirrhotic liver morphology with stigmata of portal hypertension including splenomegaly and para esophageal varices.  Note that the study is not optimized to evaluate for hepatocellular carcinoma and a surveillance MRI is recommended.  Cholelithiasis without CT evidence for cholecystitis.  Fractures of the L3 transverse processes and compression fracture of the L1 vertebral body with 30% height loss. Correlate with point tenderness and trauma history.  Given the sclerotic appearance of L1, cannot exclude an underlying lesion by CT.  Recommend correlation with outside imaging and MR if warranted.  Left sided bladder wall thickening without focal lesion.  Correlate with urinalysis and cystoscopy if warranted.  Discussed via telephone with Dr. Hyacinth Meeker at 05:20 a.m. on 10/17/2011.  Original Report Authenticated By: Waneta Martins, M.D.     Today   Subjective:   [name] [name] today has no headache,no chest abdominal pain,no new weakness tingling or numbness, feels much better wants to go  home today.   Objective:   Blood pressure 103/63, pulse 71, temperature 97.9 F (36.6 C), temperature source Oral, resp. rate 20, height 5\' 4"  (1.626 m), weight 69.4 kg (153 lb), SpO2 90.00%.  Intake/Output Summary (Last 24 hours) at 10/19/11 1019 Last data filed at 10/19/11 0500  Gross per 24 hour  Intake    840 ml  Output      0 ml  Net    840 ml    Exam Awake Alert, Oriented *3, No new F.N deficits, Normal affect [place].AT,PERRAL Supple Neck,No JVD, No cervical lymphadenopathy appriciated.  Symmetrical Chest wall movement, Good air movement bilaterally, CTAB RRR,No Gallops,Rubs or new Murmurs, No Parasternal Heave +ve B.Sounds, Abd Soft, Non tender, No organomegaly appriciated, No rebound -guarding or rigidity. No Cyanosis, Clubbing or edema, No new Rash or bruise  Data Review   Results for DYNA, FIGUEREO (MRN [id number]) as of 10/19/2011 10:05  Ref. Range 10/18/2011 00:38  Color, Urine Latest Range: YELLOW  AMBER (A)  APPearance Latest Range: CLEAR  CLOUDY (A)  Specific Gravity, Urine Latest Range: 1.005-1.030  1.022  pH Latest Range: 5.0-8.0  6.5  Glucose, UA Latest Range: NEGATIVE mg/dL NEGATIVE  Bilirubin Urine Latest Range: NEGATIVE  SMALL (A)  Ketones, ur Latest Range: NEGATIVE mg/dL TRACE (A)  Protein Latest Range: NEGATIVE mg/dL NEGATIVE  Urobilinogen, UA Latest Range: 0.0-1.0 mg/dL  1.0  Nitrite Latest Range: NEGATIVE  NEGATIVE  Leukocytes, UA Latest Range: NEGATIVE  TRACE (A)  Urine-Other No range found MUCOUS PRESENT  WBC, UA Latest Range: <3 WBC/hpf 3-6  RBC / HPF Latest Range: <3 RBC/hpf 0-2  Squamous Epithelial / LPF Latest Range: RARE  RARE  Bacteria, UA Latest Range: RARE  MANY (A)    CBC w Diff: Lab Results  Component Value Date   WBC 4.4 10/18/2011   HGB 12.6 10/18/2011   HCT 36.8 10/18/2011   PLT 74* 10/18/2011   LYMPHOPCT 43 10/17/2011   MONOPCT 15* 10/17/2011   EOSPCT 2 10/17/2011   BASOPCT 1 10/17/2011   CMP: Lab Results  Component Value  Date   NA 137 10/19/2011   K 3.6 10/19/2011   CL 107 10/19/2011   CO2 23 10/19/2011   BUN 8 10/19/2011   CREATININE 0.53 10/19/2011   PROT 5.8* 10/19/2011   ALBUMIN 2.5* 10/19/2011   BILITOT 1.8* 10/19/2011   ALKPHOS 145* 10/19/2011   AST 95* 10/19/2011   ALT 55* 10/19/2011  .  Micro Results No results found for this or any previous visit (from the past 240 hour(s)).   Discharge Instructions     Follow with Primary MD Norberto Sorenson  in 7 days   Get CBC, CMP, checked 7 days by Primary MD and again as instructed by your Primary MD. Get a 2 view Chest X ray done next visit if you had Pneumonia of Lung problems at the Hospital.  Get Medicines reviewed and adjusted.  Please request your Prim.MD to go over all Hospital Tests and Procedure/Radiological results at the follow up, please get all Hospital records sent to your Prim MD by signing hospital release before you go home.  Follow-up Information    Follow up with Rob Bunting, MD. Make an appointment in 3 weeks.   Contact information:   [address] [name] [address] [phone]       Follow up with [facility]. Make an appointment in 2 days.   Contact information:   [address] [name] [address] [phone]       Follow up with POOL,HENRY A. Make an appointment in 2 weeks.   Contact information:   [address] [name] [address] [phone]       Follow up with Lindaann Slough, MD. Make an appointment in 2 weeks.   Contact information:   [address] [facility] [name] [address] [phone]         Activity: Fall precautions use walker/cane & assistance as needed  Diet: Heart Healthy, Aspiration precautions.  Disposition Home  If you experience worsening of your admission symptoms, develop shortness of breath, life threatening emergency,  suicidal or homicidal thoughts you must seek medical attention immediately by calling 911 or calling your MD immediately  if symptoms less severe.  You Must read complete instructions/literature along with all the possible adverse reactions/side effects for all the Medicines you take and that have been prescribed to you. Take any new Medicines after you have completely understood and accpet all the possible adverse reactions/side effects.   Do not drive if your were admitted for syncope or siezures until you have seen by Primary MD or a Neurologist and advised to drive.  Do not drive when taking Pain medications.    Do not take more than prescribed Pain, Sleep and  Anxiety Medications  Special Instructions: If you have smoked or chewed Tobacco  in the last 2 yrs please stop smoking, stop any regular Alcohol  and or any Recreational drug use.  Wear Seat belts while driving.  Discharge Medications  Medication List  As of 10/19/2011 10:26 AM   START taking these medications         nadolol 20 MG tablet   Commonly known as: CORGARD   Take 1 tablet (20 mg total) by mouth daily.      nitrofurantoin 100 MG capsule   Commonly known as: MACRODANTIN   Take 1 capsule (100 mg total) by mouth every 6 (six) hours.      pantoprazole 40 MG tablet   Commonly known as: PROTONIX   Take 1 tablet (40 mg total) by mouth daily.         CONTINUE taking these medications         doxepin 25 MG capsule   Commonly known as: SINEQUAN      fluconazole 150 MG tablet   Commonly known as: DIFLUCAN      ibuprofen 200 MG tablet   Commonly known as: ADVIL,MOTRIN          Where to get your medications    These are the prescriptions that you need to pick up.   You may get these medications from any pharmacy.         nadolol 20 MG tablet   nitrofurantoin 100 MG capsule   pantoprazole 40 MG tablet           Total Time in preparing paper work, data evaluation and todays exam - 35  minutes  Leroy Sea M.D on 10/19/2011,at 10:19 AM  [facility] Office  [phone]

## 2011-10-23 ENCOUNTER — Encounter (HOSPITAL_COMMUNITY): Payer: Self-pay | Admitting: Gastroenterology

## 2011-11-07 ENCOUNTER — Ambulatory Visit (INDEPENDENT_AMBULATORY_CARE_PROVIDER_SITE_OTHER): Payer: Self-pay | Admitting: Gastroenterology

## 2011-11-07 DIAGNOSIS — K7030 Alcoholic cirrhosis of liver without ascites: Secondary | ICD-10-CM

## 2011-11-07 DIAGNOSIS — B182 Chronic viral hepatitis C: Secondary | ICD-10-CM

## 2011-11-07 LAB — CBC WITH DIFFERENTIAL/PLATELET
Basophils Absolute: 0 10*3/uL (ref 0.0–0.1)
Basophils Relative: 1 % (ref 0–1)
Eosinophils Absolute: 0.2 10*3/uL (ref 0.0–0.7)
Eosinophils Relative: 3 % (ref 0–5)
HCT: 43 % (ref 36.0–46.0)
Hemoglobin: 14.3 g/dL (ref 12.0–15.0)
Lymphocytes Relative: 43 % (ref 12–46)
Lymphs Abs: 2.4 10*3/uL (ref 0.7–4.0)
MCH: 34.1 pg — ABNORMAL HIGH (ref 26.0–34.0)
MCHC: 33.3 g/dL (ref 30.0–36.0)
MCV: 102.6 fL — ABNORMAL HIGH (ref 78.0–100.0)
Monocytes Absolute: 0.6 10*3/uL (ref 0.1–1.0)
Monocytes Relative: 11 % (ref 3–12)
Neutro Abs: 2.4 10*3/uL (ref 1.7–7.7)
Neutrophils Relative %: 42 % — ABNORMAL LOW (ref 43–77)
Platelets: 123 10*3/uL — ABNORMAL LOW (ref 150–400)
RBC: 4.19 MIL/uL (ref 3.87–5.11)
RDW: 15.6 % — ABNORMAL HIGH (ref 11.5–15.5)
WBC: 5.6 10*3/uL (ref 4.0–10.5)

## 2011-11-07 NOTE — Patient Instructions (Signed)
You have cirrhosis of the liver from prior alcohol use and hepatitis C.  I need to see how good or bad your liver function is before I can make any determination about whether or not you can be treated for hepatitis C, but I am not optimistic that you can be treated with cirrhosis.  You cannot drink alcohol.  Will see you again in about four months.

## 2011-11-08 LAB — COMPLETE METABOLIC PANEL WITH GFR
Albumin: 3.5 g/dL (ref 3.5–5.2)
Alkaline Phosphatase: 154 U/L — ABNORMAL HIGH (ref 39–117)
CO2: 24 mEq/L (ref 19–32)
GFR, Est African American: >89 mL/min
GFR, Est Non African American: >89 mL/min
Glucose, Bld: 83 mg/dL (ref 70–99)
Potassium: 4.1 mEq/L (ref 3.5–5.3)
Sodium: 141 mEq/L (ref 135–145)
Total Protein: 7 g/dL (ref 6.0–8.3)

## 2011-11-08 LAB — HEPATITIS B CORE ANTIBODY, TOTAL: Hep B Core Total Ab: NEGATIVE

## 2011-11-08 LAB — HEPATITIS B SURFACE ANTIBODY,QUALITATIVE: Hep B S Ab: POSITIVE — AB

## 2011-11-08 LAB — PROTIME-INR: INR: 1.23 (ref <1.50)

## 2011-11-08 LAB — HEPATITIS A ANTIBODY, TOTAL: Hep A Total Ab: POSITIVE — AB

## 2011-11-08 LAB — HEPATITIS B SURFACE ANTIGEN: Hepatitis B Surface Ag: NEGATIVE

## 2011-11-14 NOTE — Progress Notes (Signed)
cc: Consulting Physician:  Rob Bunting, MD, [address] [place], [address], [place], [place] [address], Fax [phone] Primary Care Physician:  Same. Referring Physician:  Georganna Skeans, MD, [place], 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    History:  The patient returns today unaccompanied. 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 A liver biopsy on 05/13/2008, showed grade 2 stage II-III disease. There  is a mixed macro and micro steatosis consistent with steatohepatitis in addition to her genotype 1 hepatitis C. The iron stain was negative. She is naive to treatment. The patient was followed in our  clinic and was due to be started on treatment for hepatitis C after her visit on 05/09/2009. There is a clinic note on 06/22/2009, indicating that because of multiple constitutional complaints  including nausea and diarrhea, the nurse practitioner following her at the time did not think she was appropriate to start on therapy. The patient recalls that when she was brought in for teaching, she was  very tearful and it was decided because of depressive symptoms, that she was not going to be treated for hepatitis C. She then has not been seen since.   There was an ultrasound of 10/01/2011, indicating a  cirrhotic liver with splenomegaly. She was then hospitalized from 10/16/2011, to 10/19/2011 for abdominal pain. During this hospitalization, she underwent a CT scan on 10/17/2011, with IV  contrast that demonstrated a cirrhotic appearing liver with evidence of portal hypertension. The CT report says "note that the study is not optimized to evaluate for hepatic  cellular carcinoma (sic)." There is  no description as to why that is the case because a contrast-enhanced CT scan if done appropriately is a perfectly appropriate test  to screen for hepatocellular carcinoma. She also underwent an EGD by Dr.  Christella Hartigan, while hospitalized, which showed grade 1 esophageal varices, but no gastric varices. He started her on a nonselective beta-blocker.  There is no history of ascites or peripheral edema. There are no symptoms of encephalopathy. She never had any episodes of GI bleeding. There are no symptoms to suggest cryoglobulin mediated disease.   PAST MEDICAL HISTORY:  Otherwise significant for what she describes as possible tinea corporis on her right leg over 6 months ago for which she was given the fluconazole on a weekly basis, and continues on as to this date.  She also has a diagnosis of chronic obstructive lung disease based on the notes from her discharge summary.   PAST SURGICAL HISTORY:  Significant for hysterectomy with lymph node dissection for cervical cancer in 2000.   CURRENT MEDICATIONS:  Nadolol 20 mg daily, nitrofurantoin 100 mg q. 6 hours, doxepin 25 mg q.i.d., fluconazole 150 mg weekly, ibuprofen 200 mg q. 6 hours p.r.n.  for pain, lorazepam 2 mg daily.  She was given a prescription for pantoprazole in the hospital but has not started this because of cost.    Allergies:  None.   habits:  Smoking denies. Alcohol, reports that she had 2 beers on the day of admission to hospital, and has rarely drank alcohol before, but it  should be noted that when I first saw her on 02/02/2009, she was very vague about her alcohol history at that time.   REVIEW OF SYSTEMS:  All 10 systems reviewed today with the patient and they are negative other than as  mentioned above. Her CES-D was 8.   PHYSICAL EXAMINATION:   CONSTITUTIONAL:  Chronically unwell, and older than stated age. There was no significant peripheral wasting, however.   Vital signs:  Height 64 inches, weight 150 pounds, blood pressure 130/82, pulse of 57, temperature 96.7 Fahrenheit.   Ears, nose, mouth, and throat:  Unremarkable oropharynx. She was  edentulous in the upper palate. There are no neck masses or thyromegaly.   Chest:  Resonant to percussion. Clear to auscultation.   Cardiovascular:  Heart sounds normal S1, S2 without murmurs or rubs. There is no peripheral edema.  Abdominal:  Normal bowel sounds. No masses or tenderness. I could not appreciate liver edge or spleen tip.   Lymphatics:  No cervical or inguinal lymphadenopathy.   Central nervous system:  No asterixis or focal neurologic findings.  Dermatologic:  Anicteric. Slight palmar erythema.   Eyes:  Possibly slightly icteric. Pupils were equal and reactive to light.   Laboratories:  I reviewed the lab work that was contained within [facility]. On 10/03/2011, using her labs her MELD calculated at 10.   Assessment:  The patient is a 51 year old woman with history of genotype 1 hepatitis C and alcohol-induced cirrhosis by radiographic imaging on a background of a liver biopsy on 05/13/2008 showing grade 2 stage II- III disease. She is naive to treatment for hepatitis C.  In terms of her hepatitis C, for which she is naive to treatment, she presents today as a poor candidate for treatment. She has cirrhosis. I need to ascertain what her MELD score is to see the stability of her  liver disease, because if her MELD score was deteriorating, she would further be a poor candidate for treatment of her hepatitis C. She had significant and anxiety depression before so she was unable to start  on therapy. I cannot be certain of her alcohol abstinence as she has always been vague with me about her alcohol history. Overall, I think she is a poor candidate for treatment of her hepatitis C.  In terms of her cirrhosis care, there is no ascites or encephalopathy to treat. She has undergone endoscopy on 10/18/2011, showing grade 1 esophageal varices for which she has already been started on nadolol. As long as she does not bleed, she does not need another endoscopy. In terms of her hepatocellular  carcinoma screening, this is  accomplished by the CT scan of 10/17/2011, done with the IV contrast. Despite the radiologist report stating that it was not "optimized" for cancer screening, it is in fact an appropriate test, and can  stand as a screening test. She needs hepatitis A and B vaccination based on her previous serologies, but this would have to be done at [facility].  In addition, influenza and Pneumovax should be given.    In my discussion today with the patient, we discussed the severity of her liver disease. I discussed the MELD score from 10/17/2011, with her. We discussed the need for surveillance imaging of her liver. We discussed treatment of her hepatitis C. I have explained to her that the bases of therapy for hepatitis C remains in pegylated interferon and ribavirin, which she was unable to tolerate in the past without even actually starting treatment. I explained to her that this may not be the appropriate time to start on therapy and she may not tolerate interferon long term.    Plan:  1. Continue with nadolol. No indication for further endoscopy, considering she has never bled.  2.  Should get an ultrasound in June 2013 and every 6 months thereafter for [facility] screening. 3. Will check MELD labs today. 4. Will test for hepatitis A and B immunity as it has been several years since these have been checked. If negative she would need to be vaccinated again by [facility]. 5. She needs Pneumovax and flu shot by [facility]. 6. Told she cannot drink alcohol. 7. She is to return to see me in approximately 4 month's time in follow up.            Brooke Dare, MD   ADDENDUM  MELD 12.  Hepatitis A and B immune.  403 .[id number]  D:  Thu Jan 10 14:40:42 2013 ; T:  Thu Jan 10 21:04:52 2013  Job #:  [id number]

## 2012-01-01 ENCOUNTER — Encounter: Payer: Self-pay | Admitting: Obstetrics & Gynecology

## 2012-01-29 ENCOUNTER — Encounter: Payer: Self-pay | Admitting: Internal Medicine

## 2012-01-31 ENCOUNTER — Ambulatory Visit (INDEPENDENT_AMBULATORY_CARE_PROVIDER_SITE_OTHER): Payer: Self-pay | Admitting: Obstetrics & Gynecology

## 2012-01-31 ENCOUNTER — Encounter: Payer: Self-pay | Admitting: Obstetrics & Gynecology

## 2012-01-31 VITALS — BP 133/84 | HR 86 | Temp 98.1°F | Ht 64.0 in | Wt 148.8 lb

## 2012-01-31 DIAGNOSIS — Z09 Encounter for follow-up examination after completed treatment for conditions other than malignant neoplasm: Secondary | ICD-10-CM

## 2012-01-31 DIAGNOSIS — R109 Unspecified abdominal pain: Secondary | ICD-10-CM

## 2012-01-31 DIAGNOSIS — Z01419 Encounter for gynecological examination (general) (routine) without abnormal findings: Secondary | ICD-10-CM

## 2012-01-31 DIAGNOSIS — Z8541 Personal history of malignant neoplasm of cervix uteri: Secondary | ICD-10-CM

## 2012-01-31 DIAGNOSIS — Z08 Encounter for follow-up examination after completed treatment for malignant neoplasm: Secondary | ICD-10-CM

## 2012-01-31 NOTE — Patient Instructions (Signed)
Return to clinic for any scheduled appointments or for any gynecologic concerns as needed.   

## 2012-01-31 NOTE — Assessment & Plan Note (Signed)
Suprapubic pain has been going on for the past 6+ months and is becoming worse. PCP at [facility] referred her to [place] today due to concern for possible recurrence of cervical cancer.  Pap smear on vaginal cuff performed today. Recent CT-abdomen/pelvis 09/2011 reviewed, which is concerning for heterogeneous bladder. Urinalysis today was normal, including no blood. Patient has been trying to get urology referral since 09/2011, however, has been unable to pay for urology referral at this time. She is in the process of getting Medicaid.   Patient informed that we recommend urology referral for evaluation of her pain.  We will follow-up on the patient's pap smear results. Patient also given mammogram scholarship for routine mammogram since she has not had one for several years.  We appreciate the referral. Patient may follow-up with [place] as needed.

## 2012-01-31 NOTE — Progress Notes (Signed)
  Subjective:    [name], female    DOB: 1961-02-08, 50 y.o.   MRN: [id number]  HPI This is a 51 year old female with a history of cervical cancer s/p hysterectomy in 2000 who was referred by her PCP at [facility] for pelvic pain.  The patient reports bilateral groin/pelvic pain for the past 6-7 months that is becoming gradually worse. Described as being an aching, dull, constant pain. She denies any fever, nausea/vomiting, changes in eating habits, weight loss; her baseline weight is about 145-150 lbs. She is complaining of urinary urgency and dysuria.   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 She reports her pelvic/groin pain was present before her hospitalization.  Review of Systems Per HPI.    Objective:   Physical Exam Gen: NAD Pulm: NI WOB Abd: decreased BS, soft, non-distended; moderate suprapubic tenderness-to-palpation without guarding or rebound GU:   Vagina: no erythema or tenderness, including vaginal cuff   Ovaries: not palpable Ext: no edema Skin: warm, dry    Assessment & Plan:  Precepted with obstetrician/gynecologist Dr. Jaynie Collins.

## 2012-02-03 LAB — POCT URINALYSIS DIP (DEVICE)
Bilirubin Urine: NEGATIVE
Glucose, UA: NEGATIVE mg/dL
Hgb urine dipstick: NEGATIVE
Ketones, ur: NEGATIVE mg/dL
Leukocytes, UA: NEGATIVE
Nitrite: NEGATIVE
Protein, ur: NEGATIVE mg/dL
Specific Gravity, Urine: 1.02 (ref 1.005–1.030)
Urobilinogen, UA: 1 mg/dL (ref 0.0–1.0)
pH: 6 (ref 5.0–8.0)

## 2012-02-04 ENCOUNTER — Other Ambulatory Visit: Payer: Self-pay | Admitting: Obstetrics & Gynecology

## 2012-02-04 DIAGNOSIS — Z1231 Encounter for screening mammogram for malignant neoplasm of breast: Secondary | ICD-10-CM

## 2012-02-05 ENCOUNTER — Telehealth: Payer: Self-pay | Admitting: *Deleted

## 2012-02-05 NOTE — Telephone Encounter (Signed)
Message copied by Mannie Stabile on Wed Feb 05, 2012  1:01 PM ------      Message from: Zola Button L      Created: Wed Feb 05, 2012 11:24 AM      Regarding: Call Patient       Patient is scheduled for colpo on 02/24/12 at 1:45 pm with [facility].  Please call patient about colpo and give her appt date and time.            Thanks,            ----- Message -----         From: Tereso Newcomer, MD         Sent: 02/05/2012  11:06 AM           To: [facility] Admin Pool            Please schedule patient for colposcopy of vaginal cuff for LGSIL pap. History of cervical cancer s/p hysterectomy.

## 2012-02-05 NOTE — Telephone Encounter (Signed)
Patient notified. I explained the colposcopy procedure to her and she stated she understood. She will follow up as planned.

## 2012-02-07 ENCOUNTER — Ambulatory Visit (HOSPITAL_COMMUNITY)
Admission: RE | Admit: 2012-02-07 | Discharge: 2012-02-07 | Disposition: A | Discharge status: Home / Self Care | Payer: Self-pay | Source: Ambulatory Visit | Attending: Obstetrics & Gynecology | Admitting: Obstetrics & Gynecology

## 2012-02-07 DIAGNOSIS — Z1231 Encounter for screening mammogram for malignant neoplasm of breast: Secondary | ICD-10-CM

## 2012-02-24 ENCOUNTER — Encounter: Payer: Self-pay | Admitting: Physician Assistant

## 2012-02-24 ENCOUNTER — Ambulatory Visit (INDEPENDENT_AMBULATORY_CARE_PROVIDER_SITE_OTHER): Payer: Self-pay | Admitting: Physician Assistant

## 2012-02-24 ENCOUNTER — Other Ambulatory Visit (HOSPITAL_COMMUNITY)
Admission: RE | Admit: 2012-02-24 | Discharge: 2012-02-24 | Disposition: A | Discharge status: Home / Self Care | Payer: Self-pay | Source: Ambulatory Visit | Attending: Physician Assistant | Admitting: Physician Assistant

## 2012-02-24 ENCOUNTER — Telehealth: Payer: Self-pay | Admitting: *Deleted

## 2012-02-24 VITALS — BP 126/85 | HR 84 | Temp 97.7°F | Ht 64.0 in | Wt 146.4 lb

## 2012-02-24 DIAGNOSIS — N949 Unspecified condition associated with female genital organs and menstrual cycle: Secondary | ICD-10-CM | POA: Insufficient documentation

## 2012-02-24 DIAGNOSIS — R87629 Unspecified abnormal cytological findings in specimens from vagina: Secondary | ICD-10-CM | POA: Insufficient documentation

## 2012-02-24 DIAGNOSIS — R102 Pelvic and perineal pain: Secondary | ICD-10-CM

## 2012-02-24 DIAGNOSIS — N893 Dysplasia of vagina, unspecified: Secondary | ICD-10-CM

## 2012-02-24 DIAGNOSIS — N898 Other specified noninflammatory disorders of vagina: Secondary | ICD-10-CM

## 2012-02-24 NOTE — Patient Instructions (Signed)

## 2012-02-24 NOTE — Telephone Encounter (Signed)
Telephoned patient at home # left message to return call to clinic. Patient left before received ultra sound appointment. Patient is scheduled for Wed. May 1 3:30 pm arrive at 3:15 with full bladder. Patient is unaware. Pt called called back and is aware of appointment

## 2012-02-26 ENCOUNTER — Ambulatory Visit (HOSPITAL_COMMUNITY): Payer: Self-pay

## 2012-02-28 ENCOUNTER — Ambulatory Visit (HOSPITAL_COMMUNITY)
Admission: RE | Admit: 2012-02-28 | Discharge: 2012-02-28 | Disposition: A | Discharge status: Home / Self Care | Payer: Self-pay | Source: Ambulatory Visit | Attending: Physician Assistant | Admitting: Physician Assistant

## 2012-02-28 DIAGNOSIS — R102 Pelvic and perineal pain: Secondary | ICD-10-CM

## 2012-02-28 DIAGNOSIS — R1032 Left lower quadrant pain: Secondary | ICD-10-CM | POA: Insufficient documentation

## 2012-02-28 DIAGNOSIS — Z8541 Personal history of malignant neoplasm of cervix uteri: Secondary | ICD-10-CM | POA: Insufficient documentation

## 2012-02-28 DIAGNOSIS — Z90710 Acquired absence of both cervix and uterus: Secondary | ICD-10-CM | POA: Insufficient documentation

## 2012-02-28 DIAGNOSIS — N949 Unspecified condition associated with female genital organs and menstrual cycle: Secondary | ICD-10-CM | POA: Insufficient documentation

## 2012-03-05 ENCOUNTER — Telehealth: Payer: Self-pay | Admitting: *Deleted

## 2012-03-05 NOTE — Telephone Encounter (Signed)
Pt informed of results, voiced understanding. Would like to cancel her upcoming appt. She will call to make 6 month pap smear appt at a later time.

## 2012-03-05 NOTE — Telephone Encounter (Signed)
Pt called in requesting her results.

## 2012-03-05 NOTE — Telephone Encounter (Signed)
I spoke with [name] [name] regarding patients results of vaginal colpo. She said that patients vaginal colpo showed some changes in the cells, but NOT CANCER. We will follow her closely with a repeat pap in 6 months. She has an appt scheduled for results that she can keep if she wants to discuss with provider but is not necessary if she is okay with results via phone. I attempted to call patient but got her voicemail, I left her a message to call us back.

## 2012-03-09 ENCOUNTER — Other Ambulatory Visit (HOSPITAL_COMMUNITY): Payer: Self-pay | Admitting: Family Medicine

## 2012-03-09 DIAGNOSIS — M549 Dorsalgia, unspecified: Secondary | ICD-10-CM

## 2012-03-12 ENCOUNTER — Ambulatory Visit (HOSPITAL_COMMUNITY)
Admission: RE | Admit: 2012-03-12 | Discharge: 2012-03-12 | Disposition: A | Discharge status: Home / Self Care | Payer: Self-pay | Source: Ambulatory Visit | Attending: Family Medicine | Admitting: Family Medicine

## 2012-03-12 DIAGNOSIS — M5137 Other intervertebral disc degeneration, lumbosacral region: Secondary | ICD-10-CM | POA: Insufficient documentation

## 2012-03-12 DIAGNOSIS — M4120 Other idiopathic scoliosis, site unspecified: Secondary | ICD-10-CM | POA: Insufficient documentation

## 2012-03-12 DIAGNOSIS — M79609 Pain in unspecified limb: Secondary | ICD-10-CM | POA: Insufficient documentation

## 2012-03-12 DIAGNOSIS — M51379 Other intervertebral disc degeneration, lumbosacral region without mention of lumbar back pain or lower extremity pain: Secondary | ICD-10-CM | POA: Insufficient documentation

## 2012-03-12 DIAGNOSIS — M549 Dorsalgia, unspecified: Secondary | ICD-10-CM

## 2012-03-13 ENCOUNTER — Encounter: Payer: Self-pay | Admitting: *Deleted

## 2012-03-13 ENCOUNTER — Ambulatory Visit: Payer: Self-pay | Admitting: Advanced Practice Midwife

## 2012-03-31 ENCOUNTER — Ambulatory Visit: Payer: Self-pay | Admitting: Internal Medicine

## 2012-04-01 ENCOUNTER — Telehealth: Payer: Self-pay | Admitting: Internal Medicine

## 2012-04-01 NOTE — Telephone Encounter (Signed)
Message copied by Arna Snipe on Wed Apr 01, 2012  9:49 AM ------      Message from: Richardson Chiquito      Created: Wed Apr 01, 2012  8:01 AM                   ----- Message -----         From: Hart Carwin, MD         Sent: 03/31/2012   9:20 PM           To: Richardson Chiquito, CMA            Please charge no show fee.      ----- Message -----         From: Richardson Chiquito, CMA         Sent: 03/31/2012   3:36 PM           To: Hart Carwin, MD            Patient no showed her appointment with Dr Juanda Chance on 03/31/12. Dr Juanda Chance, do you want to charge no show fee?

## 2012-04-02 ENCOUNTER — Ambulatory Visit (INDEPENDENT_AMBULATORY_CARE_PROVIDER_SITE_OTHER): Payer: Self-pay | Admitting: Gastroenterology

## 2012-04-02 DIAGNOSIS — K7460 Unspecified cirrhosis of liver: Secondary | ICD-10-CM

## 2012-04-02 DIAGNOSIS — B182 Chronic viral hepatitis C: Secondary | ICD-10-CM

## 2012-04-02 LAB — CREATININE, SERUM: Creat: 0.55 mg/dL (ref 0.50–1.10)

## 2012-04-02 LAB — HEPATIC FUNCTION PANEL
ALT: 54 U/L — ABNORMAL HIGH (ref 0–35)
Albumin: 3.7 g/dL (ref 3.5–5.2)
Alkaline Phosphatase: 160 U/L — ABNORMAL HIGH (ref 39–117)
Indirect Bilirubin: 1.3 mg/dL — ABNORMAL HIGH (ref 0.0–0.9)
Total Protein: 6.9 g/dL (ref 6.0–8.3)

## 2012-04-02 LAB — AFP TUMOR MARKER: AFP-Tumor Marker: 54.6 ng/mL — ABNORMAL HIGH (ref 0.0–8.0)

## 2012-04-02 MED ORDER — NADOLOL 20 MG PO TABS: 20.0000 mg | ORAL_TABLET | Freq: Every day | ORAL | Status: DC

## 2012-04-02 NOTE — Patient Instructions (Signed)
1. You can take up to 2 grams (4 extra strength Tylenol) per day provided you eat properly 2.  Restart Nadolol 20 mg at bedtime to treat your blood pressure and prevent bleeding from your liver disease

## 2012-04-05 ENCOUNTER — Other Ambulatory Visit: Payer: Self-pay | Admitting: Gastroenterology

## 2012-04-09 NOTE — Progress Notes (Signed)
NAME:  [name] [name]    MR#:  [id number]      DATE:  04/02/2012  DOB:  Nov 05, 1960    cc: Consulting Physician: Rob Bunting, MD, [facility], [address] [place], [address], [place], [place] [address], Fax [phone]  Primary Care Physician: Same  Referring Physician: Georganna Skeans, MD, [address], [address] [place], [place] [address], Fax [phone]  Vela Prose A. 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    HISTORY:  The patient returns today unaccompanied. 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 Liver biopsy on 05/13/2008, showing grade 2 stage II-III disease. There is a mixed micro and macro steatosis consistent with steatohepatitis in addition to her genotype 1 hepatitis C. Her iron stain was negative. She remains naive to treatment. When followed in our clinic in the past, she was due to be started on treatment for hepatitis C after her visit on 05/09/2009. The clinic note of 06/22/2009, indicates because of multiple constitutional complaints including nausea and diarrhea. The nurse practitioner who was seeing her at that time, did not think she was appropriate to start on therapy. She was very tearful when she was brought in for medication teaching. It was thought that depressive symptoms would preclude her from starting on therapy for hepatitis C. She finally returned on 11/07/2011. It was my impression at that time that she was still not an appropriate candidate for therapy as previously documented.   Today, the patient focuses in on pain that is unrelated to her liver disease. She states that she has significant scoliosis and degenerative disk disease based on MRI. The report suggests that these are both  slight. She has also been seen in gynecology for pelvic pain. She was supposed to be seen in followup by Dr. Juanda Chance on 03/31/2012, but she did not show for the appointment. There are no symptoms to suggest decompensated liver disease such as encephalopathy or interval episodes of variceal bleeding. She has never had any episodes of variceal bleeding. There are no symptoms of encephalopathy. There are no symptoms of cryoglobulin mediated disease.   PAST MEDICAL HISTORY: No other interval change.   CURRENT MEDICATIONS:  1. Ativan 2 mg t.i.d.  2. She was supposed to be on Nadolol 20 mg daily, but states that she cannot afford this at [facility] where she was to pay 20 dollars a month for prescriptions. She has not tried to price this elsewhere. She has tried ibuprofen for musculoskeletal complaints, but complains of gastrointestinal upset from this. She reports that she has been told that acetaminophen is unsafe to use because of her liver disease.   CURRENT ALLERGIES:  Denies.   HABITS:  Smoking denies. Alcohol denies. Reports that her last drink of alcohol was months ago. Previously she had explained that her last drink of alcohol was 2 beers on the day of admission to hospital on 10/16/2011, for abdominal pain.   REVIEW OF SYSTEMS:  All 10 systems reviewed today with the patient and they are negative other than which is mentioned above. Again, most of the visit she focused in on various pain that is not related to her liver disease. Her CES-D was 23.   PHYSICAL EXAMINATION:   Constitutional: Appeared somewhat older than stated age, chronically  unwell, but there is no peripheral wasting. Vital signs: Height 64 inches, weight 146 pounds, down 4 pounds from previous, blood pressure 122/89, pulse 78, temperature 97.1 Fahrenheit. Ears, Nose, Mouth and Throat:  Unremarkable oropharynx.  No thyromegaly or neck masses. She is edentulous in the upper palate.  Chest:  Resonant to percussion.  Clear to  auscultation.  Cardiovascular:  Heart sounds normal S1, S2 without murmurs or rubs.  There is no peripheral edema.  Abdomen:  Normal bowel sounds.  No masses or tenderness.  I could not appreciate a liver edge or spleen tip.  I could not appreciate any hernias.  Lymphatics:  No cervical or inguinal lymphadenopathy.  Central Nervous System:  No asterixis or focal neurologic findings.  Dermatologic:  Anicteric without palmar erythema or spider angiomata.  Eyes:  Anicteric sclerae.  Pupils are equal and reactive to light.  LABORATORIES:  There has not been any labs since 11/07/2011, in EPIC.   As previously mentioned last imaging of the liver was 10/17/2011.  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 She remains naive to treatment for hepatitis C.   In terms her hepatitis C, as mentioned, she is naive to treatment. I think that she is a poor candidate for treatment given her focusing on her multiple somatic complaints. She would likely not tolerate the significant myalgias and arthralgias associated with interferon. She has had significant anxiety and depression in the past, so that precludes her from starting therapy when it was attempted. As previously, she has been very vague about her alcohol abstinence, I suspect that she is now abstinent.  MELD has previously been 12 suggesting that she has somewhat of a risk of decompensation on treatment. I also am concerned about her compliance. She was able to obtain her Alprazolam, but cannot afford Nadolol, which is only 4 dollars a month at [facility], for example.   In terms of cirrhosis care, there is no ascites or encephalopathy to treat. She was screened for varices on 10/18/2011, showing grade 1 esophageal varices. She was supposed to be on Nadolol, but is not taking this.  She has never bled, so as long she is taking Nadolol, she does not need another endoscopy. In terms of hepatocellular cancer screening [facility]), she had a CT scan on 10/17/2011, which is acceptable for screening. She is hepatitis A and B immune. Presumably would need Pneumovax and influenza vaccination through her primary physician at [facility].   In my discussion today with the patient, I reiterated why I thought she was a poor candidate for treatment of her hepatitis C. I have explained to her that her complaints of various myalgias and arthralgias are not related to her liver disease. We discussed the safe dosing of acetaminophen. I explained to her that she can take up to 2 grams per day of acetaminophen, as long as she is eating properly. I discussed restarting her Nadolol with her.   PLAN:  1. I have given her a prescription for Nadolol 20 mg at bedtime, 30 days supply, 5 refills, and I asked her to check the prices of it at [facility] and other pharmacies.  2. Hepatitis A and B immune.  3. No need for further endoscopy as long as she remains on Nadolol.  4. Will order ultrasound for December 2013. The CT scan from previous  will serve as screening for the year, contrary to my previous note.  5. MELD labs today.  6. Return in 6 months' time.  7. I have given written instructions that she is to take no more than 2 grams Tylenol a day provided she is eating properly then.  8. Return in 6 months' time, in December 2013.               Brooke Dare, MD   ADDENDUM: Ms Lin Givens called on 04/05/12 to the on call person at [facility] to report ankle swelling, four days after I told her that she did not have swelling.  She also called the [facility] office on 04/09/12, to complain about swelling and that she got but then stopped the nadolol after reading the side effect profile and thinking that it could contribute to the swelling.  Considering that she had no edema when she thought she had, I explained to her on  the phone on 04/09/12, that she would have to come back in to be seen and the earliest I was available was on 04/23/12.  Otherwise, she can seek care that [facility] ER, urgent care, or [facility].  She will wait for an appt.  [phone] .[id number]  D:  Thu Jun 06 19:42:25 2013 ; T:  Thu Jun 06 22:06:21 2013  Job #:  [id number]

## 2012-04-14 ENCOUNTER — Telehealth: Payer: Self-pay

## 2012-04-15 ENCOUNTER — Ambulatory Visit: Payer: Self-pay | Admitting: Rehabilitation

## 2012-04-23 ENCOUNTER — Ambulatory Visit: Payer: Self-pay | Admitting: Gastroenterology

## 2012-05-07 ENCOUNTER — Ambulatory Visit (HOSPITAL_COMMUNITY): Admission: RE | Admit: 2012-05-07 | Payer: Self-pay | Source: Ambulatory Visit

## 2012-05-08 ENCOUNTER — Ambulatory Visit (HOSPITAL_COMMUNITY): Admission: RE | Admit: 2012-05-08 | Payer: Self-pay | Source: Ambulatory Visit

## 2012-05-12 ENCOUNTER — Ambulatory Visit (HOSPITAL_COMMUNITY)
Admission: RE | Admit: 2012-05-12 | Discharge: 2012-05-12 | Disposition: A | Discharge status: Home / Self Care | Payer: Self-pay | Source: Ambulatory Visit | Attending: Family Medicine | Admitting: Family Medicine

## 2012-05-12 DIAGNOSIS — R609 Edema, unspecified: Secondary | ICD-10-CM | POA: Insufficient documentation

## 2012-05-12 DIAGNOSIS — M7989 Other specified soft tissue disorders: Secondary | ICD-10-CM

## 2012-05-12 NOTE — Progress Notes (Signed)
VASCULAR LAB PRELIMINARY  PRELIMINARY  PRELIMINARY  PRELIMINARY  Bilateral lower extremity venous duplex  completed.    Preliminary report:  Bilateral:  No evidence of DVT, superficial thrombosis, or Baker's Cyst.  Lymph nodes noted in bilateral groins.   [name] [name], RVT 05/12/2012, 2:45 PM

## 2012-07-01 ENCOUNTER — Emergency Department (INDEPENDENT_AMBULATORY_CARE_PROVIDER_SITE_OTHER)
Admission: EM | Admit: 2012-07-01 | Discharge: 2012-07-01 | Disposition: A | Discharge status: Home / Self Care | Payer: Self-pay | Source: Home / Self Care | Attending: Family Medicine | Admitting: Family Medicine

## 2012-07-01 ENCOUNTER — Encounter (HOSPITAL_COMMUNITY): Payer: Self-pay | Admitting: Emergency Medicine

## 2012-07-01 DIAGNOSIS — Z760 Encounter for issue of repeat prescription: Secondary | ICD-10-CM

## 2012-07-01 DIAGNOSIS — R609 Edema, unspecified: Secondary | ICD-10-CM

## 2012-07-01 DIAGNOSIS — R600 Localized edema: Secondary | ICD-10-CM

## 2012-07-01 MED ORDER — TRAMADOL HCL 50 MG PO TABS: 50.0000 mg | ORAL_TABLET | Freq: Three times a day (TID) | ORAL | Status: DC | PRN

## 2012-07-01 MED ORDER — LORAZEPAM 2 MG PO TABS: 2.0000 mg | ORAL_TABLET | Freq: Three times a day (TID) | ORAL | Status: DC | PRN

## 2012-07-01 NOTE — ED Notes (Signed)
Pt having edema and discoloration of bilateral feet. Pt states it has been like this for over a month. Pt also requesting prescription refills of her celebrex, lorazepam, and tramadol for a 90 day supply.

## 2012-07-02 NOTE — ED Provider Notes (Signed)
History     CSN: [id number]  Arrival date & time 07/01/12  4132   First MD Initiated Contact with Patient 07/01/12 [id number]      Chief Complaint  Patient presents with  . Foot Pain    (Consider location/radiation/quality/duration/timing/severity/associated sxs/prior treatment) HPI Comments: 51 y/o female with h/o alcohol dependency, chronic back and legs pain, hep C and cirrhosis. Here requesting refills of tramadol and ativan "she has taken for years only has few pills left". She was a  [facility] patient and as the clinic closed she needs refills on her prescriptions. States she already has a follow up appointment at the internal medicine outpatient clinic in about 2 weeks. Also patient c7o chronic foot pain and swelling. Had doppler studies done 2 months ago with no evidence of dvt. Denies current chest pain, abdominal pain or shortness of breath.    Past Medical History  Diagnosis Date  . Alcoholic cirrhosis   . Hepatitis C     chronic hepatitis C and Steatohepatitis (hep grade 2, stage 2-3) per 05/13/08 liver biopsy  . COPD (chronic obstructive pulmonary disease)   . Heart palpitations   . cervical Cancer   . Anxiety   . Portal hypertension   . Esophageal varices   . Cholelithiasis   . Splenomegaly   . Fatty liver   . Esophagitis 2010  . Tubulovillous adenoma of colon     Past Surgical History  Procedure Date  . Cervical cancer surgery   . Abdominal hysterectomy   . Tubal ligation   . Esophagogastroduodenoscopy 10/18/2011    Procedure: ESOPHAGOGASTRODUODENOSCOPY (EGD);  Surgeon: Rob Bunting, MD;  Location: [facility];  Service: Endoscopy;  Laterality: N/A;    Family History  Problem Relation Age of Onset  . Cervical cancer Sister   . Breast cancer Maternal Aunt     History  Substance Use Topics  . Smoking status: Former Smoker -- 40 years    Types: Cigarettes    Quit date: 10/16/2011  . Smokeless tobacco: Never Used  . Alcohol Use: Yes     occasionally     OB History    Grav Para Term Preterm Abortions TAB SAB Ect Mult Living   3 2   1  1   2       Review of Systems  Constitutional: Negative for fever and chills.  Respiratory: Negative for shortness of breath.   Cardiovascular: Negative for chest pain.  Gastrointestinal: Negative for nausea, vomiting and diarrhea.  Musculoskeletal: Positive for back pain.       As per HPI  Skin: Negative for rash.    Allergies  Review of patient's allergies indicates no known allergies.  Home Medications   Current Outpatient Rx  Name Route Sig Dispense Refill  . NADOLOL 20 MG PO TABS Oral Take 1 tablet (20 mg total) by mouth daily. 30 tablet 11  . DOXEPIN HCL 25 MG PO CAPS Oral Take 50 mg by mouth 4 (four) times daily.      Marland Kitchen FLUCONAZOLE 150 MG PO TABS Oral Take 150 mg by mouth every 7 (seven) days.      . IBUPROFEN 200 MG PO TABS Oral Take 200 mg by mouth every 6 (six) hours as needed. Pain relief     . LORAZEPAM 2 MG PO TABS Oral Take 1 tablet (2 mg total) by mouth every 8 (eight) hours as needed. 20 tablet 0  . PANTOPRAZOLE SODIUM 40 MG PO TBEC Oral Take 1 tablet (40 mg total) by  mouth daily. 60 tablet 0  . TRAMADOL HCL 50 MG PO TABS Oral Take 1 tablet (50 mg total) by mouth every 8 (eight) hours as needed for pain. 20 tablet 0    BP 135/90  Pulse 73  Temp 96.3 F (35.7 C) (Oral)  Resp 24  SpO2 91%  Physical Exam  Nursing note and vitals reviewed. Constitutional: She is oriented to person, place, and time. She appears well-developed and well-nourished. No distress.  HENT:  Head: Normocephalic and atraumatic.  Eyes: No scleral icterus.  Neck: No JVD present.  Cardiovascular: Normal heart sounds.        Bilateral trace ankle edema also involving distal pretibial area symmetric. There are venostasis changes of the skin in dorsal feet ankles and pretibial. No ulcers or skin brakes. Both feel are warm no distal cyanosis.  Impress both lower extremities neurovascularly intact.    Pulmonary/Chest: Breath sounds normal. She has no wheezes. She has no rales.  Abdominal: Soft.       No ascitis.   Neurological: She is alert and oriented to person, place, and time.    ED Course  Procedures (including critical care time)  Labs Reviewed - No data to display No results found.   1. Bilateral edema of lower extremity   2. Encounter for medication refill       MDM  Refilled tramadol and ativan to use prn and space until next PCP appointment were she can discusse her medications. Discussed medication refill policy at Urgent Care center. Patient voiced understanding that she needs to establish PCP for further medication refills. Patient has a case Production designer, theatre/television/film.         Sharin Grave, MD 07/02/12 [phone]

## 2012-07-08 ENCOUNTER — Ambulatory Visit (INDEPENDENT_AMBULATORY_CARE_PROVIDER_SITE_OTHER): Payer: Self-pay | Admitting: Internal Medicine

## 2012-07-08 ENCOUNTER — Encounter: Payer: Self-pay | Admitting: Internal Medicine

## 2012-07-08 VITALS — BP 108/73 | HR 68 | Temp 96.9°F | Ht 63.0 in | Wt 155.0 lb

## 2012-07-08 DIAGNOSIS — J449 Chronic obstructive pulmonary disease, unspecified: Secondary | ICD-10-CM

## 2012-07-08 DIAGNOSIS — C801 Malignant (primary) neoplasm, unspecified: Secondary | ICD-10-CM

## 2012-07-08 DIAGNOSIS — F419 Anxiety disorder, unspecified: Secondary | ICD-10-CM

## 2012-07-08 DIAGNOSIS — Z23 Encounter for immunization: Secondary | ICD-10-CM

## 2012-07-08 DIAGNOSIS — F411 Generalized anxiety disorder: Secondary | ICD-10-CM

## 2012-07-08 DIAGNOSIS — G8929 Other chronic pain: Secondary | ICD-10-CM

## 2012-07-08 DIAGNOSIS — C539 Malignant neoplasm of cervix uteri, unspecified: Secondary | ICD-10-CM

## 2012-07-08 DIAGNOSIS — F17200 Nicotine dependence, unspecified, uncomplicated: Secondary | ICD-10-CM

## 2012-07-08 DIAGNOSIS — R32 Unspecified urinary incontinence: Secondary | ICD-10-CM

## 2012-07-08 DIAGNOSIS — Z79891 Long term (current) use of opiate analgesic: Secondary | ICD-10-CM | POA: Insufficient documentation

## 2012-07-08 DIAGNOSIS — IMO0002 Reserved for concepts with insufficient information to code with codable children: Secondary | ICD-10-CM

## 2012-07-08 DIAGNOSIS — R079 Chest pain, unspecified: Secondary | ICD-10-CM

## 2012-07-08 DIAGNOSIS — R609 Edema, unspecified: Secondary | ICD-10-CM

## 2012-07-08 DIAGNOSIS — Z0000 Encounter for general adult medical examination without abnormal findings: Secondary | ICD-10-CM | POA: Insufficient documentation

## 2012-07-08 DIAGNOSIS — B1920 Unspecified viral hepatitis C without hepatic coma: Secondary | ICD-10-CM

## 2012-07-08 MED ORDER — TRAMADOL HCL 50 MG PO TABS: 50.0000 mg | ORAL_TABLET | Freq: Four times a day (QID) | ORAL | Status: AC | PRN

## 2012-07-08 MED ORDER — LORAZEPAM 2 MG PO TABS: 2.0000 mg | ORAL_TABLET | Freq: Three times a day (TID) | ORAL | Status: DC | PRN

## 2012-07-08 MED ORDER — ALBUTEROL SULFATE (2.5 MG/3ML) 0.083% IN NEBU: 2.5000 mg | INHALATION_SOLUTION | Freq: Four times a day (QID) | RESPIRATORY_TRACT | Status: DC | PRN

## 2012-07-08 MED ORDER — OXYBUTYNIN CHLORIDE ER 10 MG PO TB24: 10.0000 mg | ORAL_TABLET | Freq: Every day | ORAL | Status: DC

## 2012-07-08 MED ORDER — CELECOXIB 200 MG PO CAPS: 200.0000 mg | ORAL_CAPSULE | Freq: Every day | ORAL | Status: DC

## 2012-07-08 MED ORDER — NADOLOL 20 MG PO TABS: 20.0000 mg | ORAL_TABLET | Freq: Every day | ORAL | Status: DC

## 2012-07-08 NOTE — Assessment & Plan Note (Signed)
Quit 12/12. Previously smoked 1ppd x 40ys

## 2012-07-08 NOTE — Assessment & Plan Note (Signed)
Will check PFTs and provide Albuterol inhaler.

## 2012-07-08 NOTE — Patient Instructions (Signed)
Please review and return the signed the pain contract at your next visit  Please bring in the name of the person you are to see about your "nerves"   Anxiety and Panic Attacks Anxiety is your body's way of reacting to real danger or something you think is a danger. It may be fear or worry over a situation like losing your job. Sometimes the cause is not known. A panic attack is made up of physical signs like sweating, shaking, or chest pain. Anxiety and panic attacks may start suddenly. They may be strong. They may come at any time of day, even while sleeping. They may come at any time of life. Panic attacks are scary, but they do not harm you physically.   HOME CARE  Avoid any known causes of your anxiety.   Try to relax. Yoga may help. Tell yourself everything will be okay.   Exercise often.   Get expert advice and help (therapy) to stop anxiety or attacks from happening.   Avoid caffeine, alcohol, and drugs.   Only take medicine as told by your doctor.  GET HELP RIGHT AWAY IF:  Your attacks seem different than normal attacks.   Your problems are getting worse or concern you.  MAKE SURE YOU:  Understand these instructions.   Will watch your condition.   Will get help right away if you are not doing well or get worse.  Document Released: 11/16/2010 Document Revised: 10/03/2011 Document Reviewed: 11/16/2010 [facility] Patient Information 2012 [place], [place].
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
## 2012-07-08 NOTE — Assessment & Plan Note (Addendum)
MRI from 5/13 showed slight lumbar scoliosis and slight multilevel DDD at L1-2 and L5-S1 w/o impingement.  She also c/o B foot pain, likely due to venous stasis. Edema of bilateral feet and ankles and RLE with stasis discoloration, no lichenification, good DP and PT pulses.  Nonpitting edema of feet, 1+ pitting edema of RLE from mid shin to knee.A recent Duplex was neg for DVT.  Will monitor.  She takes Tramadol q6h daily for her back pain and for her bilateral foot pain, which was refilled at this clinic visit.
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
## 2012-07-10 NOTE — Assessment & Plan Note (Addendum)
Per labs from 1/13, she has immunity from Hep A&B.  Will continue Nadolol for her varices. Will continue to monitor

## 2012-07-10 NOTE — Progress Notes (Signed)
I saw, examined, and discussed the patient with Dr Sherrine Maples and agree with the note contained here. Ms [name] [name] had many issues today but the focus was on her chronic pain, anxiety, and tobacco use. She will be sch for PFT's and offered cessation support. Dr Sherrine Maples did refill her benzo ([facility]) but started educating her on the addition of an SSRI with the goal of eventual tapering and cessation of benzo. Refill of opioid. To get UDs next visit (didn;t today bc of the concern that she had run out of meds and UDS would be negative).   Next visit - UDS, sign contract, start SSRI, ensure she F/U with mental health services, address other issues.

## 2012-07-10 NOTE — Assessment & Plan Note (Signed)
Pt has GYN exam at [facility], and states that she needs to be seen for her 21mo f/u due to previous abnormal pap.

## 2012-07-10 NOTE — Assessment & Plan Note (Addendum)
Appears to have stress urinary incontinence. Will start Ditropan and see if this helps improve her sx.

## 2012-07-21 ENCOUNTER — Ambulatory Visit (HOSPITAL_COMMUNITY)
Admission: RE | Admit: 2012-07-21 | Discharge: 2012-07-21 | Disposition: A | Discharge status: Home / Self Care | Payer: Self-pay | Source: Ambulatory Visit | Attending: Internal Medicine | Admitting: Internal Medicine

## 2012-07-21 DIAGNOSIS — J449 Chronic obstructive pulmonary disease, unspecified: Secondary | ICD-10-CM

## 2012-07-21 DIAGNOSIS — J4489 Other specified chronic obstructive pulmonary disease: Secondary | ICD-10-CM | POA: Insufficient documentation

## 2012-07-21 MED ORDER — ALBUTEROL SULFATE (5 MG/ML) 0.5% IN NEBU
2.5000 mg | INHALATION_SOLUTION | Freq: Once | RESPIRATORY_TRACT | Status: AC
  Administered 2012-07-21: 2.5 mg via RESPIRATORY_TRACT

## 2012-07-22 ENCOUNTER — Encounter: Payer: Self-pay | Admitting: Internal Medicine

## 2012-07-22 ENCOUNTER — Ambulatory Visit (INDEPENDENT_AMBULATORY_CARE_PROVIDER_SITE_OTHER): Payer: Self-pay | Admitting: Internal Medicine

## 2012-07-22 VITALS — BP 146/83 | HR 57 | Temp 97.0°F | Resp 20 | Ht 62.5 in | Wt 157.8 lb

## 2012-07-22 DIAGNOSIS — Z0000 Encounter for general adult medical examination without abnormal findings: Secondary | ICD-10-CM

## 2012-07-22 DIAGNOSIS — F419 Anxiety disorder, unspecified: Secondary | ICD-10-CM

## 2012-07-22 DIAGNOSIS — Z23 Encounter for immunization: Secondary | ICD-10-CM

## 2012-07-22 DIAGNOSIS — G8929 Other chronic pain: Secondary | ICD-10-CM

## 2012-07-22 DIAGNOSIS — R32 Unspecified urinary incontinence: Secondary | ICD-10-CM

## 2012-07-22 DIAGNOSIS — F411 Generalized anxiety disorder: Secondary | ICD-10-CM

## 2012-07-22 DIAGNOSIS — J449 Chronic obstructive pulmonary disease, unspecified: Secondary | ICD-10-CM

## 2012-07-22 DIAGNOSIS — B182 Chronic viral hepatitis C: Secondary | ICD-10-CM

## 2012-07-22 MED ORDER — CITALOPRAM HYDROBROMIDE 20 MG PO TABS: 20.0000 mg | ORAL_TABLET | Freq: Every day | ORAL | Status: DC

## 2012-07-22 MED ORDER — LORAZEPAM 2 MG PO TABS: 2.0000 mg | ORAL_TABLET | Freq: Three times a day (TID) | ORAL | Status: DC | PRN

## 2012-07-22 MED ORDER — PROPRANOLOL HCL 20 MG PO TABS: 20.0000 mg | ORAL_TABLET | Freq: Every day | ORAL | Status: DC

## 2012-07-22 MED ORDER — ALBUTEROL SULFATE (2.5 MG/3ML) 0.083% IN NEBU: 2.5000 mg | INHALATION_SOLUTION | Freq: Four times a day (QID) | RESPIRATORY_TRACT | Status: DC

## 2012-07-22 MED ORDER — IPRATROPIUM BROMIDE 0.02 % IN SOLN: 500.0000 ug | Freq: Four times a day (QID) | RESPIRATORY_TRACT | Status: DC

## 2012-07-22 MED ORDER — TRAMADOL HCL 50 MG PO TABS: 50.0000 mg | ORAL_TABLET | Freq: Four times a day (QID) | ORAL | Status: DC | PRN

## 2012-07-22 NOTE — Patient Instructions (Addendum)
**  Please take all of your medications as prescribed** Use the Atrovent and the Albuterol in the nebulizer four times a day for your breathing.   Stop taking the Nadolol and start taking the Propranolol 20mg  daily. These are the same class of medications.  Please follow up with Mental Health at [facility]- they can provide counseling for your anxiety and help with antianxiety medications. Please see them within the next 60 days- if you do not see them, you will be violating your pain contract with [place], and I will not be able to prescribe you any more Ativan or Tramadol.  Start taking Celexa 20mg  daily. Vesta Mixer will take over prescribing this medication when you visit them.  The Oxybutynin, Celexa, and Propranolol are all on the $4 pharmacy list. The Oxybutynin is available at [facility] on the $4 list, but not at [facility]   Anxiety and Panic Attacks Anxiety is your body's way of reacting to real danger or something you think is a danger. It may be fear or worry over a situation like losing your job. Sometimes the cause is not known. A panic attack is made up of physical signs like sweating, shaking, or chest pain. Anxiety and panic attacks may start suddenly. They may be strong. They may come at any time of day, even while sleeping. They may come at any time of life. Panic attacks are scary, but they do not harm you physically.   HOME CARE  Avoid any known causes of your anxiety.   Try to relax. Yoga may help. Tell yourself everything will be okay.   Exercise often.   Get expert advice and help (therapy) to stop anxiety or attacks from happening.   Avoid caffeine, alcohol, and drugs.   Only take medicine as told by your doctor.  GET HELP RIGHT AWAY IF:  Your attacks seem different than normal attacks.   Your problems are getting worse or concern you.  MAKE SURE YOU:  Understand these instructions.   Will watch your condition.   Will get help right away if you are not doing well or get  worse.  Document Released: 11/16/2010 Document Revised: 10/03/2011 Document Reviewed: 11/16/2010 [facility] Patient Information 2012 [place], [place].

## 2012-07-22 NOTE — Progress Notes (Signed)
[name], female   DOB: 10-13-1961, 51 y.o.   MRN: [id number]  Subjective:   [name] female   DOB: 1961/03/22 51 y.o.   MRN: [id number]  HPI: [name] [name] is a 51 y.o. female w/ PMH significant for Hep C with liver cirrhosis COPD, chronic pain, and urinary incontinence, presents for a follow up after her new pt visit 07/08/12.   She recently had PFTs which show significant obstruction, but questionable validity b/c the pt was unable to perform certain manuevers appropriately. She is still having to use her nebulizer 2-3 x daily. Unfortunately, she does not insurance and cannot afford a preventative medication for her COPD.  She still endorses using the Ativan 2mg  TID, and appears to have significant stressors in her life, including her family. She is also c/o of lumbar pain due to her DDD and scoliosis, both of which were determined to be mild per imaging. She is taking Tramadol 4x daily. She did bring the pain contract back with her to clinic. She has reviewed it and is willing to sign it.  She has been on Nadolol for her varices but is no longer able to afford it.  Past Medical History  Diagnosis Date  . Alcoholic cirrhosis   . Hepatitis C     chronic hepatitis C and Steatohepatitis (hep grade 2, stage 2-3) per 05/13/08 liver biopsy  . COPD (chronic obstructive pulmonary disease)   . Heart palpitations   . cervical Cancer   . Anxiety   . Portal hypertension   . Esophageal varices   . Cholelithiasis   . Splenomegaly   . Fatty liver   . Esophagitis 2010  . Tubulovillous adenoma of colon    Current Outpatient Prescriptions  Medication Sig Dispense Refill  . albuterol (PROVENTIL) (2.5 MG/3ML) 0.083% nebulizer solution Take 3 mLs (2.5 mg total) by nebulization 4 (four) times daily.  360 mL  12  . celecoxib (CELEBREX) 200 MG capsule Take 1 capsule (200 mg total) by mouth daily.  30 capsule  1  . LORazepam (ATIVAN) 2 MG tablet Take 1 tablet (2 mg total)  by mouth every 8 (eight) hours as needed.  90 tablet  1  . traMADol (ULTRAM) 50 MG tablet Take 1 tablet (50 mg total) by mouth every 6 (six) hours as needed.  120 tablet  1  . citalopram (CELEXA) 20 MG tablet Take 1 tablet (20 mg total) by mouth daily.  30 tablet  2  . ibuprofen (ADVIL,MOTRIN) 200 MG tablet Take 200 mg by mouth every 6 (six) hours as needed. Pain relief       . ipratropium (ATROVENT) 0.02 % nebulizer solution Take 2.5 mLs (500 mcg total) by nebulization 4 (four) times daily.  300 mL  12  . oxybutynin (DITROPAN XL) 10 MG 24 hr tablet Take 1 tablet (10 mg total) by mouth daily.  30 tablet  2  . pantoprazole (PROTONIX) 40 MG tablet Take 1 tablet (40 mg total) by mouth daily.  60 tablet  0  . propranolol (INDERAL) 20 MG tablet Take 1 tablet (20 mg total) by mouth daily.  60 tablet  11   Family History  Problem Relation Age of Onset  . Cervical cancer Sister   . Breast cancer Maternal Aunt   . Heart disease Father 37    died from MI  . Cervical cancer Daughter    History   Social History  . Marital Status: Married    Spouse Name:  N/A    Number of Children: N/A  . Years of Education: N/A   Social History Main Topics  . Smoking status: Current Some Day Smoker -- 0.1 packs/day for 40 years    Types: Cigarettes    Last Attempt to Quit: 10/16/2011  . Smokeless tobacco: Never Used   Comment: smoked about a week ago when she got upset-brother still smokes  . Alcohol Use: No     occa  . Drug Use: No  . Sexually Active: Not Currently    Birth Control/ Protection: Condom   Other Topics Concern  . Not on file   Social History Narrative  . No narrative on file   Review of Systems: Constitutional: Denies fever, chills, diaphoresis, appetite change and fatigue.  HEENT: Denies photophobia, eye pain, redness, hearing loss, ear pain, congestion, sore throat, rhinorrhea, sneezing, mouth sores, trouble swallowing, neck pain, neck stiffness and tinnitus.  Respiratory: Endorses  productive cough. Denies SOB, DOE, cough, chest tightness, and wheezing.  Cardiovascular: Endorses swelling of BLE, esp ankles. Denies chest pain, palpitations.  Gastrointestinal: Denies nausea, vomiting, abdominal pain, diarrhea, constipation, blood in stool and abdominal distention.  Genitourinary: Denies dysuria, urgency, frequency, hematuria, flank pain and difficulty urinating.  Musculoskeletal: Endorses bilateral foot pain w/ ambulation and lumbosacral pain.  Skin: Endorses purple color change to her feet at times. Denies pallor, rash or wound.  Neurological: Denies dizziness, seizures, syncope, weakness, light-headedness, numbness and headaches.  Hematological: Denies adenopathy, easy bruising, personal or family bleeding history  Psychiatric/Behavioral: Endorses anxiety and panic attacks. Denies suicidal ideation, confusion, or sleep disturbance.  Objective:  Physical Exam: Filed Vitals:   07/22/12 1330  BP: 146/83  Pulse: 57  Temp: 97 F (36.1 C)  TempSrc: Oral  Resp: 20  Height: 5' 2.5" (1.588 m)  Weight: 157 lb 12.8 oz (71.578 kg)  SpO2: 95%   Constitutional: Vital signs reviewed. Patient is a well-developed and well-nourished female in no acute distress and cooperative with exam. Alert and oriented x3.  Head: Normocephalic and atraumatic  Mouth: No teeth, MMM, erythema or oropharynx.  Eyes: PERRL, EOMI, no scleral icterus.  Neck: Supple, Trachea midline normal ROM, No JVD, mass, or thyromegaly.  Cardiovascular: RRR, no MRG, pulses symmetric and intact bilaterally  Pulmonary/Chest: CTAB Abdominal: Obese, soft, non-tender, non-distended, liver palpable below costal margin, no masses, or guarding present.  Musculoskeletal: No joint deformities, erythema, or stiffness, ROM full Hematology: No cervical adenopathy.  Neurological: A&O x3, Strength is normal and symmetric bilaterally, cranial nerve II-XII are grossly intact Skin: Edema of the ankles, with venous stasis  discoloration ofthe feet. Warm, dry and intact.  Psychiatric: Slightly anxious and disorganized. Speech is slow.   Assessment & Plan:   Please refer to Problem List based A&P.

## 2012-07-23 ENCOUNTER — Encounter: Payer: Self-pay | Admitting: Internal Medicine

## 2012-07-23 LAB — PRESCRIPTION ABUSE MONITORING 15P, URINE
Amphetamine/Meth: NEGATIVE ng/mL
Buprenorphine, Urine: NEGATIVE ng/mL
Cannabinoid Scrn, Ur: NEGATIVE ng/mL
Cocaine Metabolites: NEGATIVE ng/mL
Creatinine, Urine: 43.63 mg/dL (ref >20.0)
Methadone Screen, Urine: NEGATIVE ng/mL
Opiate Screen, Urine: NEGATIVE ng/mL
Oxycodone Screen, Ur: NEGATIVE ng/mL
Propoxyphene: NEGATIVE ng/mL

## 2012-07-23 NOTE — Assessment & Plan Note (Addendum)
No recent bleeds. Was on Nadolol for varices, but now unable to afford. Will switch to propranolol which is on the $4 list.  - Propranolol 20mg  daily

## 2012-07-23 NOTE — Assessment & Plan Note (Signed)
She has not had the Detrol prescription filled. Told her it is on the $4 list. Still with urge incontinence. No stress incontinence. Will see how she does with the Detrol.
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
## 2012-07-23 NOTE — Assessment & Plan Note (Addendum)
She has quit smoking but did smoke once since her last clinic visit. She needs a daily preventative medicine but is unable to afford one at this time. Atrovent and Albuterol are on the $4 medication list. Will have the pt use a combination in the nebulizer q6h.  - Atrovent 0.02%, 1 vial QID in the nebulizer - Albutrol 0.083%, 1 vial QID in the nebulizer - Continue to abstain from smoking.

## 2012-07-23 NOTE — Progress Notes (Signed)
I saw, examined, and discussed the patient with Dr Sherrine Maples and agree with the note contained here. Agree with additional of celexa and cont the benzo for 60 days so that the pt has ample time to be seen by [facility] and get her benzo Rx transferred to them.

## 2012-07-24 LAB — BENZODIAZEPINES (GC/LC/MS), URINE
Alprazolam metabolite (GC/LC/MS), ur confirm: NEGATIVE ng/mL
Clonazepam metabolite (GC/LC/MS), ur confirm: NEGATIVE ng/mL
Diazepam (GC/LC/MS), ur confirm: NEGATIVE ng/mL
Flunitrazepam metabolite (GC/LC/MS), ur confirm: NEGATIVE ng/mL
Lorazepam (GC/LC/MS), ur confirm: 621 ng/mL
Midazolam (GC/LC/MS), ur confirm: NEGATIVE ng/mL
Oxazepam (GC/LC/MS), ur confirm: NEGATIVE ng/mL
Temazepam (GC/LC/MS), ur confirm: NEGATIVE ng/mL
Triazolam metabolite (GC/LC/MS), ur confirm: NEGATIVE ng/mL

## 2012-07-24 LAB — TRAMADOL, URINE: Tramadol, Urine: >10000 ng/mL — ABNORMAL HIGH

## 2012-07-27 ENCOUNTER — Telehealth: Payer: Self-pay | Admitting: Licensed Clinical Social Worker

## 2012-07-27 NOTE — Telephone Encounter (Signed)
[name] placed call to CSW, left message requesting phone number to [facility].  CSW return call and informed [name] as a new patient establishing care, [name] [name] accepts walk-in's only.  Provided [name] with address, phone number and walk-in hours.  Explained there is most often a 2-3 hour wait and to be prepare and encouraged patience.  Discussed once pt is seen and establish will be able to schedule follow-up appt. Pt denies add'l needs at this time.

## 2012-08-18 ENCOUNTER — Other Ambulatory Visit: Payer: Self-pay | Admitting: Internal Medicine

## 2012-08-18 MED ORDER — ALBUTEROL SULFATE HFA 108 (90 BASE) MCG/ACT IN AERS: 2.0000 | INHALATION_SPRAY | Freq: Four times a day (QID) | RESPIRATORY_TRACT | Status: DC | PRN

## 2012-09-03 ENCOUNTER — Encounter: Payer: Self-pay | Admitting: Internal Medicine

## 2012-09-03 ENCOUNTER — Ambulatory Visit (INDEPENDENT_AMBULATORY_CARE_PROVIDER_SITE_OTHER): Payer: Self-pay | Admitting: Internal Medicine

## 2012-09-03 VITALS — BP 125/73 | HR 79 | Temp 98.9°F | Ht 62.0 in | Wt 161.4 lb

## 2012-09-03 DIAGNOSIS — B182 Chronic viral hepatitis C: Secondary | ICD-10-CM

## 2012-09-03 DIAGNOSIS — G8929 Other chronic pain: Secondary | ICD-10-CM

## 2012-09-03 DIAGNOSIS — F411 Generalized anxiety disorder: Secondary | ICD-10-CM

## 2012-09-03 DIAGNOSIS — F17200 Nicotine dependence, unspecified, uncomplicated: Secondary | ICD-10-CM

## 2012-09-03 DIAGNOSIS — K7460 Unspecified cirrhosis of liver: Secondary | ICD-10-CM

## 2012-09-03 DIAGNOSIS — F419 Anxiety disorder, unspecified: Secondary | ICD-10-CM

## 2012-09-03 DIAGNOSIS — J449 Chronic obstructive pulmonary disease, unspecified: Secondary | ICD-10-CM

## 2012-09-03 MED ORDER — AMITRIPTYLINE HCL 150 MG PO TABS: 150.0000 mg | ORAL_TABLET | Freq: Every day | ORAL | Status: DC

## 2012-09-03 MED ORDER — IPRATROPIUM-ALBUTEROL 18-103 MCG/ACT IN AERO: 2.0000 | INHALATION_SPRAY | Freq: Four times a day (QID) | RESPIRATORY_TRACT | Status: DC

## 2012-09-03 MED ORDER — MELOXICAM 15 MG PO TABS: 15.0000 mg | ORAL_TABLET | Freq: Two times a day (BID) | ORAL | Status: DC

## 2012-09-03 NOTE — Assessment & Plan Note (Addendum)
Still with back pain- pain appears to be more right paraspinal pain than spinal and her muscles are very tight. She does still have significant vascular changes to her feet, but they are very well perfused. Will try Mobic and Amitriptyline for the pt in the hope that this improves her pain.  Will also refer to PM&R for further evaluation and treatment. Will also begin Amitriptyline.  - Mobic 15mg  BID - Amitriptyline 150mg  qhs - PM&R referral

## 2012-09-03 NOTE — Assessment & Plan Note (Signed)
Sh was unable to obtain the nebulizer medication of Albuterol and Atrovent. The MAP program contacted me and asked for her medication to be changed to Combivent. - Combivent Inhaler 2p q6h

## 2012-09-03 NOTE — Assessment & Plan Note (Signed)
She was seen by the [facility] hepatologist her in [place], but since they moved, she has no way to get to [facility]. Will refer to the new [facility] affiliated practice- pt will have to pay a $50 copay for her 1st visit.

## 2012-09-03 NOTE — Patient Instructions (Signed)
Start taking the Mobic 15mg  every 12 hours for your pain. Also begin taking Amitriptyline 150mg  at night. We will refer you to a Physical Medicine and Rehabilitation physician. They work with pain management, and will hopefully be able to further reduce your pain.  As a part of your Pain Contract, you need to be seen at [facility] within the next few weeks (1-2 weeks). If you are not seen by [facility], your Pain Contract will be void.  For your breathing, begin the Combivent inhaler 2 puffs every 6 hours. Continue to stay away from smoking- Great job quitting!!!  We will refer you to the new liver doctor. There will be a $50 copay for your first visit, but they do have a payment plan.  If you do not hear about the referral appointments in the next week or two, please call the clinic.

## 2012-09-03 NOTE — Progress Notes (Signed)
[name], female   DOB: January 09, 1961, 51 y.o.   MRN: [id number]  Subjective:   [name] female   DOB: January 02, 1961 51 y.o.   MRN: [id number]  HPI: [name].[name] [name] is a 51 y.o. female w/ PMH significant for Hep C with liver cirrhosis COPD, and chronic pain, presents for a routine follow up.  She c/o pain in her back that has worsened since she was last seen on 9/25. She describes her pain as starting in her lumbosacral region extending superiorly in the left paraspinal region. She has been taking Tramadol, but does not feel that it is as effective as it previously was. She is asking for a different medication for her pain.   She never picked up the Albuterol and Atrovent nebulizer medication, but states that she feels like her breathing is doing fine. She reports that she has been cigarette free from over 2 months.   She states that her anxiety is improved, but she is still taking the Ativan 1 tab about three times a day. She did not purchase the Celexa prescribed to her, and has still not been seen at [facility].   Past Medical History  Diagnosis Date  . Alcoholic cirrhosis   . Hepatitis C     chronic hepatitis C and Steatohepatitis (hep grade 2, stage 2-3) per 05/13/08 liver biopsy  . COPD (chronic obstructive pulmonary disease)   . Heart palpitations   . cervical Cancer   . Anxiety   . Portal hypertension   . Esophageal varices   . Cholelithiasis   . Splenomegaly   . Fatty liver   . Esophagitis 2010  . Tubulovillous adenoma of colon    Current Outpatient Prescriptions  Medication Sig Dispense Refill  . albuterol (PROVENTIL HFA;VENTOLIN HFA) 108 (90 BASE) MCG/ACT inhaler Inhale 2 puffs into the lungs every 6 (six) hours as needed for wheezing.  1 Inhaler  4  . albuterol-ipratropium (COMBIVENT) 18-103 MCG/ACT inhaler Inhale 2 puffs into the lungs 4 (four) times daily.  1 Inhaler  2  . amitriptyline (ELAVIL) 150 MG tablet Take 1 tablet (150 mg total) by mouth  at bedtime.  30 tablet  6  . ibuprofen (ADVIL,MOTRIN) 200 MG tablet Take 200 mg by mouth every 6 (six) hours as needed. Pain relief       . LORazepam (ATIVAN) 2 MG tablet Take 1 tablet (2 mg total) by mouth every 8 (eight) hours as needed.  90 tablet  1  . meloxicam (MOBIC) 15 MG tablet Take 1 tablet (15 mg total) by mouth every 12 (twelve) hours.  30 tablet  2  . oxybutynin (DITROPAN XL) 10 MG 24 hr tablet Take 1 tablet (10 mg total) by mouth daily.  30 tablet  2  . pantoprazole (PROTONIX) 40 MG tablet Take 1 tablet (40 mg total) by mouth daily.  60 tablet  0  . propranolol (INDERAL) 20 MG tablet Take 1 tablet (20 mg total) by mouth daily.  60 tablet  11  . traMADol (ULTRAM) 50 MG tablet Take 1 tablet (50 mg total) by mouth every 6 (six) hours as needed.  120 tablet  1  . [DISCONTINUED] albuterol (PROVENTIL) (2.5 MG/3ML) 0.083% nebulizer solution Take 3 mLs (2.5 mg total) by nebulization 4 (four) times daily.  360 mL  12   Family History  Problem Relation Age of Onset  . Cervical cancer Sister   . Breast cancer Maternal Aunt   . Heart disease Father 46  died from MI  . Cervical cancer Daughter    History   Social History  . Marital Status: Married    Spouse Name: N/A    Number of Children: N/A  . Years of Education: N/A   Social History Main Topics  . Smoking status: Former Smoker -- 0.1 packs/day for 40 years    Types: Cigarettes    Quit date: 10/16/2011  . Smokeless tobacco: Never Used     Comment: smoked about a week ago when she got upset-brother still smokes  . Alcohol Use: No     Comment: occa  . Drug Use: No  . Sexually Active: Not Currently    Birth Control/ Protection: Condom   Other Topics Concern  . Not on file   Social History Narrative  . No narrative on file   Review of Systems: Constitutional: Denies fever, chills, diaphoresis, appetite change and fatigue.  HEENT: Denieseye pain, hearing loss, congestion, sore throat, rhinorrhea.   Respiratory: +  occasional SOB and DOE    Cardiovascular: + BLE edema. Denies chest pain Gastrointestinal: Denies nausea, vomiting, abdominal pain, diarrhea, constipation, blood in stool and abdominal distention.  Genitourinary: Denies dysuria, urgency, frequency  Musculoskeletal: + back pain and pain with ambulation due to feet. Skin: + purple discoloration of feet  Neurological: Denies dizziness, seizures, syncope, weakness, light-headedness, numbness and headaches.  Psychiatric/Behavioral: Denies suicidal ideation, mood changes, anxiety improved, still psychosocial issues regarding her family  Objective:  Physical Exam: Filed Vitals:   09/03/12 1534  BP: 125/73  Pulse: 79  Temp: 98.9 F (37.2 C)  TempSrc: Oral  Height: 5\' 2"  (1.575 m)  Weight: 161 lb 6.4 oz (73.211 kg)  SpO2: 93%   Constitutional: Vital signs reviewed.  Patient is a well-developed and well-nourished female in no acute distress and cooperative with exam. Alert and oriented x3.  Head: Normocephalic and atraumatic Eyes: PERRL, EOMI, conjunctivae normal, No scleral icterus.  Neck: Supple, Trachea midline Cardiovascular: RRR, no MRG, pulses symmetric and intact bilaterally Pulmonary/Chest: CTAB, mild diffuse end expiratory wheezes, most audible over trachea Abdominal: Soft. Non-tender, non-distended Musculoskeletal: Lumbosacral pain and stiffness. Pt walking slightly bent forward with a cane. Right paraspinal TTP Hematology: No cervical adenopathy.  Neurological: A&O x3, Strength is normal and symmetric bilaterally, cranial nerve II-XII are grossly intact, no focal motor deficit, sensation intact.  Skin: Purple coloration to B feet, but warm with good pulses.  Psychiatric: Slightly anxious but improved from LOV.Speech is slowed but at baseline for the pt.    Assessment & Plan:  Please refer to Problem List based A&P. INTERNAL MEDICINE TEACHING ATTENDING ADDENDUM - Lars Mage, MD: I personally saw and evaluated [name] [name] in this  clinic visit in conjunction with the resident, Dr. Sherrine Maples. I have discussed the patient's plan of care with Dr. Sherrine Maples during this visit. I have confirmed the physical exam findings and have read and agree with the clinic note including the plan.

## 2012-09-05 ENCOUNTER — Encounter: Payer: Self-pay | Admitting: Internal Medicine

## 2012-09-05 NOTE — Assessment & Plan Note (Addendum)
Currently she is taking Ativan for her anxiety but reports improvement, except with family arguments involving her siblings. I suspect that this is also contributing to her chronic pain. Per our Pain Contract, she had 60 days from its inception to be seen at [facility] for further evaluation of her anxiety. She has about 2 weeks to be seen at [facility] until the 60 days is up. She is aware of this and is going soon. She is also aware that she must walk-in to [facility] for her 1st visit and for her follow up visits she will be able to schedule appointments. - Will hold on the Celexa for now - [facility] for further evaluation and treatment

## 2012-09-15 ENCOUNTER — Encounter: Payer: Self-pay | Admitting: Licensed Clinical Social Worker

## 2012-09-16 ENCOUNTER — Other Ambulatory Visit: Payer: Self-pay | Admitting: Internal Medicine

## 2012-09-16 ENCOUNTER — Telehealth: Payer: Self-pay | Admitting: *Deleted

## 2012-09-16 DIAGNOSIS — G8929 Other chronic pain: Secondary | ICD-10-CM

## 2012-09-16 NOTE — Telephone Encounter (Signed)
Pt called and was upset that her ativan Rx was denied.  She states she has been on ativan for a long time and needs this med for anxiety. I talked with Dr Lonzo Cloud and med was denied because pt was to f/u at [facility] for treatment and receive meds from them.   Pt states she had an appointment on 11/19 at [facility] and they told her they could not help her.  No meds given.  I called [facility] - Left a message for a return call and clarification of visit.

## 2012-09-17 ENCOUNTER — Other Ambulatory Visit: Payer: Self-pay | Admitting: Internal Medicine

## 2012-09-17 DIAGNOSIS — G8929 Other chronic pain: Secondary | ICD-10-CM

## 2012-09-17 NOTE — Telephone Encounter (Signed)
Pt called in again today asking for Rx for ativan.  She will be out over the weekend.  I called [facility], walk in clinic again.  Message left for them to return my call and get clarification on treatment.

## 2012-09-18 ENCOUNTER — Other Ambulatory Visit: Payer: Self-pay | Admitting: Internal Medicine

## 2012-09-18 DIAGNOSIS — G8929 Other chronic pain: Secondary | ICD-10-CM

## 2012-09-18 DIAGNOSIS — F419 Anxiety disorder, unspecified: Secondary | ICD-10-CM

## 2012-09-18 MED ORDER — TRAMADOL HCL 50 MG PO TABS: 50.0000 mg | ORAL_TABLET | Freq: Four times a day (QID) | ORAL | Status: DC | PRN

## 2012-09-18 MED ORDER — LORAZEPAM 2 MG PO TABS: 2.0000 mg | ORAL_TABLET | Freq: Three times a day (TID) | ORAL | Status: DC | PRN

## 2012-09-18 NOTE — Telephone Encounter (Signed)
I talked with Dr Sherrine Maples and asked if she wanted the refill on ativan called in.  She would like meds called in for pt and would like patient to  f/u with [facility].  Pt has been informed. I have placed several calls to [facility] with no response.  Will continue to try to reach them.

## 2012-09-18 NOTE — Progress Notes (Signed)
Lorazepam called to [facility] Tramadol called to [facility]

## 2012-09-18 NOTE — Telephone Encounter (Signed)
I talked with Misty Stanley at [facility].  She states pt was seen by Dr August Saucer at walk in clinic. I then talked with Antionette Poles [phone] ).  She states pt was seen and left dissatisfied. She will fax the office note over after we send in a release of information signed form.   Dr Sherrine Maples informed and pt will be scheduled for appointment

## 2012-09-21 ENCOUNTER — Encounter: Payer: Self-pay | Admitting: Physical Medicine & Rehabilitation

## 2012-09-21 ENCOUNTER — Telehealth: Payer: Self-pay | Admitting: *Deleted

## 2012-09-21 NOTE — Telephone Encounter (Signed)
CALLED PATIENT , SHE ANSWERED PHONE THEN WOULDN'T SAY ANYTHING. I CALLED BACK X2, PHONE WENT TO [facility] TRYING TO GET IN CONTACT WITH HER TO SET UP APPOINTMENT. LELA STURDIVANT NT [phone]  10:02AM

## 2012-10-08 ENCOUNTER — Encounter: Payer: Self-pay | Admitting: Internal Medicine

## 2012-10-08 ENCOUNTER — Ambulatory Visit (INDEPENDENT_AMBULATORY_CARE_PROVIDER_SITE_OTHER): Payer: No Typology Code available for payment source | Admitting: Internal Medicine

## 2012-10-08 VITALS — BP 144/90 | HR 64 | Temp 97.2°F | Ht 62.5 in | Wt 168.8 lb

## 2012-10-08 DIAGNOSIS — K7460 Unspecified cirrhosis of liver: Secondary | ICD-10-CM

## 2012-10-08 DIAGNOSIS — G8929 Other chronic pain: Secondary | ICD-10-CM

## 2012-10-08 DIAGNOSIS — F419 Anxiety disorder, unspecified: Secondary | ICD-10-CM

## 2012-10-08 DIAGNOSIS — B182 Chronic viral hepatitis C: Secondary | ICD-10-CM

## 2012-10-08 DIAGNOSIS — K209 Esophagitis, unspecified without bleeding: Secondary | ICD-10-CM | POA: Insufficient documentation

## 2012-10-08 DIAGNOSIS — F411 Generalized anxiety disorder: Secondary | ICD-10-CM

## 2012-10-08 MED ORDER — AMITRIPTYLINE HCL 50 MG PO TABS: 50.0000 mg | ORAL_TABLET | Freq: Every day | ORAL | Status: DC

## 2012-10-08 MED ORDER — PANTOPRAZOLE SODIUM 40 MG PO TBEC: 40.0000 mg | DELAYED_RELEASE_TABLET | Freq: Every day | ORAL | Status: DC

## 2012-10-08 NOTE — Patient Instructions (Addendum)
**  please be sure to get your ultrasound**  **Please be sure to keep your appointments with [facility] and pain management**

## 2012-10-08 NOTE — Assessment & Plan Note (Signed)
Pt not taking Protonix. Will renew today.

## 2012-10-08 NOTE — Assessment & Plan Note (Signed)
Pt on Tramadol. Appointment with pain management specialist 3/14. Will continue the Tramadol PRN until she is seen by pain management, and from there, will let pain management take over her pain control.

## 2012-10-08 NOTE — Assessment & Plan Note (Addendum)
Seen at [facility] in November. Pt to f/u this Tues, Dec17th. I reiterated to [name] that the goal is not to mask her anxiety with the Ativan, but to help improve her anxiety and not need the Ativan, which is why I want her to continue to be treated at [facility]. She appeared to understand and states that she is going to her next appointment on Tues. Also, given her Hep C, will decrease the Elavil to 50mg  qhs, and then transition to an SSI.

## 2012-10-08 NOTE — Progress Notes (Signed)
[name], female   DOB: 1961/07/13, 51 y.o.   MRN: [id number]  Subjective:   [name] female   DOB: Jun 01, 1961 51 y.o.   MRN: [id number]  HPI: [name] is a 51 y.o. Hep C with liver cirrhosis, COPD, chronic pain, and significant anxiety who presents today at my request. She was reportedly seen at [facility] and left the appointment unhappy with her planned therapy. She is currently receiving Ativan prescribed by me for her Ativan. She is under a medication contract for her Ativan with the understanding that she is to continue follow up at [facility] and follow their plan of treatment for her anxiety. Also, she will be seen by a pain specialist with an appointment scheduled for March, 2014.  Looking at previous labs, her AFP from 6/13 was elevated to 54.6, up from 13.6 12/12. An abdominal u/s was ordered by Dr. Scot Jun in the hepatitis clinic, but it was never performed. She has been referred to the new Hepatology clinic opening here soon, but is still waiting to be scheduled.   Past Medical History  Diagnosis Date  . Alcoholic cirrhosis   . Hepatitis C     chronic hepatitis C and Steatohepatitis (hep grade 2, stage 2-3) per 05/13/08 liver biopsy  . COPD (chronic obstructive pulmonary disease)   . Heart palpitations   . cervical Cancer   . Anxiety   . Portal hypertension   . Esophageal varices   . Cholelithiasis   . Splenomegaly   . Fatty liver   . Esophagitis 2010  . Tubulovillous adenoma of colon    Current Outpatient Prescriptions  Medication Sig Dispense Refill  . albuterol (PROVENTIL HFA;VENTOLIN HFA) 108 (90 BASE) MCG/ACT inhaler Inhale 2 puffs into the lungs every 6 (six) hours as needed for wheezing.  1 Inhaler  4  . albuterol-ipratropium (COMBIVENT) 18-103 MCG/ACT inhaler Inhale 2 puffs into the lungs 4 (four) times daily.  1 Inhaler  2  . amitriptyline (ELAVIL) 150 MG tablet Take 1 tablet (150 mg total) by mouth at bedtime.  30 tablet  6  .  LORazepam (ATIVAN) 2 MG tablet Take 1 tablet (2 mg total) by mouth every 8 (eight) hours as needed.  90 tablet  0  . meloxicam (MOBIC) 15 MG tablet Take 1 tablet (15 mg total) by mouth every 12 (twelve) hours.  30 tablet  2  . propranolol (INDERAL) 20 MG tablet Take 1 tablet (20 mg total) by mouth daily.  60 tablet  11  . traMADol (ULTRAM) 50 MG tablet Take 1 tablet (50 mg total) by mouth every 6 (six) hours as needed.  120 tablet  1  . ibuprofen (ADVIL,MOTRIN) 200 MG tablet Take 200 mg by mouth every 6 (six) hours as needed. Pain relief       . oxybutynin (DITROPAN XL) 10 MG 24 hr tablet Take 1 tablet (10 mg total) by mouth daily.  30 tablet  2  . pantoprazole (PROTONIX) 40 MG tablet Take 1 tablet (40 mg total) by mouth daily.  30 tablet  11  . [DISCONTINUED] pantoprazole (PROTONIX) 40 MG tablet Take 1 tablet (40 mg total) by mouth daily.  60 tablet  0  . [DISCONTINUED] pantoprazole (PROTONIX) 40 MG tablet Take 1 tablet (40 mg total) by mouth daily.  30 tablet  11   Family History  Problem Relation Age of Onset  . Cervical cancer Sister   . Breast cancer Maternal Aunt   . Heart disease Father 20  died from MI  . Cervical cancer Daughter    History   Social History  . Marital Status: Married    Spouse Name: N/A    Number of Children: N/A  . Years of Education: N/A   Social History Main Topics  . Smoking status: Former Smoker -- 0.1 packs/day for 40 years    Types: Cigarettes    Quit date: 10/16/2011  . Smokeless tobacco: Never Used     Comment: smoked about a week ago when she got upset-brother still smokes  . Alcohol Use: No     Comment: occa  . Drug Use: No  . Sexually Active: Not Currently    Birth Control/ Protection: Condom   Other Topics Concern  . None   Social History Narrative  . None   Review of Systems: Constitutional: +fatigue.  HEENT: Denies photophobia, eye pain, redness, hearing loss, ear pain, congestion.   Respiratory: Denies SOB, DOE   Cardiovascular: Denies chest pain, palpitations.  Gastrointestinal: +hemorrhoids. Denies nausea, vomiting, abdominal pain. Genitourinary: Denies dysuria, urgency, frequency, hematuria, flank pain.  Musculoskeletal: Denies myalgias, back pain, joint swelling, arthralgias and gait problem.  Skin: Denies pallor, rash and wound.  Neurological: Denies dizziness, seizures, syncope. Psychiatric/Behavioral: +anxiety. Denies suicidal ideation, mood changes  Objective:  Physical Exam: Filed Vitals:   10/08/12 1432  BP: 144/90  Pulse: 64  Temp: 97.2 F (36.2 C)  TempSrc: Oral  Height: 5' 2.5" (1.588 m)  Weight: 168 lb 12.8 oz (76.567 kg)  SpO2: 94%   Constitutional: Vital signs reviewed.  Patient is a well-developed and well-nourished female in no acute distress and cooperative with exam. Head: Normocephalic and atraumatic Eyes: PERRL, EOMI Cardiovascular: RRR, no MRG Pulmonary/Chest: CTAB, no wheezes, rales, or rhonchi Abdominal: Soft. Non-tender, non-distended Musculoskeletal: No joint deformities Neurological: A&O x3,non focal.  Psychiatric: Appears drowsy today. A&Ox3. Speech slightly slower today.  Assessment & Plan:   Please refer to Problem List based Assessment and Plan   Pt to f/u in 35mo or sooner as needed.

## 2012-10-08 NOTE — Progress Notes (Signed)
Pt aware [place] of abd sch [facility] 10/13/12 8:30AM - NPO.  Stanton Kidney Demetrus Pavao RN 10/08/12 3:30PM

## 2012-10-08 NOTE — Assessment & Plan Note (Addendum)
AFP from 6/13 >54. Abdominal u/s was ordered but not performed. Will reorder u/s to r/o mass concering for HCC. Also rechecking LFTs

## 2012-10-09 NOTE — Progress Notes (Signed)
INTERNAL MEDICINE TEACHING ATTENDING ADDENDUM -Jonah Blue, DO: I reviewed with the resident Dr. Sherrine Maples, the medical history, physical examination, diagnosis and results of tests and treatment and I agree with the patient's care as documented. Due to elevated AFP and hx Hep C w/ cirrhosis, needs U/S liver.

## 2012-10-13 ENCOUNTER — Ambulatory Visit (HOSPITAL_COMMUNITY): Payer: No Typology Code available for payment source

## 2012-10-15 ENCOUNTER — Ambulatory Visit (HOSPITAL_COMMUNITY): Payer: No Typology Code available for payment source

## 2012-10-29 ENCOUNTER — Ambulatory Visit (HOSPITAL_COMMUNITY): Payer: No Typology Code available for payment source

## 2012-10-29 ENCOUNTER — Telehealth: Payer: Self-pay | Admitting: *Deleted

## 2012-10-29 NOTE — Telephone Encounter (Signed)
Pt calls and states she is returning a call from someone stating that she needed to "get up there and see my doctor" states the person did not identify themselves, i have looked over the chart and see nothing since pt's last visit 12/12. She seems confused and speech is slurred. i have made her an appt w/ dr Sherrine Maples for 2/6 at 1415 per [place]. Pt did not end call but said nothing i ask if she needed anything else, she hesitated and said no but continued to stay on phone, i ask if she was ok? She stated yes but maybe she needed to call back for an appt, i reminded her that i had just given her one and that she stated she wrote it down, she stated that she would write it again and i repeated the appt. She then said ok and hung up

## 2012-10-30 ENCOUNTER — Emergency Department (HOSPITAL_COMMUNITY): Payer: No Typology Code available for payment source

## 2012-10-30 ENCOUNTER — Observation Stay (HOSPITAL_COMMUNITY): Payer: No Typology Code available for payment source

## 2012-10-30 ENCOUNTER — Observation Stay (HOSPITAL_COMMUNITY)
Admission: EM | Admit: 2012-10-30 | Discharge: 2012-10-31 | Disposition: A | Discharge status: Home / Self Care | Payer: No Typology Code available for payment source | Attending: Internal Medicine | Admitting: Internal Medicine

## 2012-10-30 ENCOUNTER — Encounter (HOSPITAL_COMMUNITY): Payer: Self-pay | Admitting: Emergency Medicine

## 2012-10-30 DIAGNOSIS — H5316 Psychophysical visual disturbances: Principal | ICD-10-CM

## 2012-10-30 DIAGNOSIS — K7290 Hepatic failure, unspecified without coma: Secondary | ICD-10-CM

## 2012-10-30 DIAGNOSIS — J449 Chronic obstructive pulmonary disease, unspecified: Secondary | ICD-10-CM

## 2012-10-30 DIAGNOSIS — K7460 Unspecified cirrhosis of liver: Secondary | ICD-10-CM

## 2012-10-30 DIAGNOSIS — J4489 Other specified chronic obstructive pulmonary disease: Secondary | ICD-10-CM

## 2012-10-30 DIAGNOSIS — Z79899 Other long term (current) drug therapy: Secondary | ICD-10-CM | POA: Insufficient documentation

## 2012-10-30 DIAGNOSIS — K7030 Alcoholic cirrhosis of liver without ascites: Secondary | ICD-10-CM | POA: Insufficient documentation

## 2012-10-30 DIAGNOSIS — F411 Generalized anxiety disorder: Secondary | ICD-10-CM | POA: Insufficient documentation

## 2012-10-30 DIAGNOSIS — R1310 Dysphagia, unspecified: Secondary | ICD-10-CM | POA: Insufficient documentation

## 2012-10-30 DIAGNOSIS — G8929 Other chronic pain: Secondary | ICD-10-CM

## 2012-10-30 DIAGNOSIS — F419 Anxiety disorder, unspecified: Secondary | ICD-10-CM

## 2012-10-30 DIAGNOSIS — B182 Chronic viral hepatitis C: Secondary | ICD-10-CM

## 2012-10-30 DIAGNOSIS — Z8619 Personal history of other infectious and parasitic diseases: Secondary | ICD-10-CM | POA: Diagnosis present

## 2012-10-30 DIAGNOSIS — R441 Visual hallucinations: Secondary | ICD-10-CM

## 2012-10-30 DIAGNOSIS — F1020 Alcohol dependence, uncomplicated: Secondary | ICD-10-CM | POA: Insufficient documentation

## 2012-10-30 HISTORY — DX: Visual hallucinations: R44.1

## 2012-10-30 LAB — CBC WITH DIFFERENTIAL/PLATELET
Basophils Absolute: 0.1 10*3/uL (ref 0.0–0.1)
Lymphocytes Relative: 33 % (ref 12–46)
Lymphs Abs: 2.2 10*3/uL (ref 0.7–4.0)
Neutro Abs: 3.5 10*3/uL (ref 1.7–7.7)
Neutrophils Relative %: 52 % (ref 43–77)
Platelets: 107 10*3/uL — ABNORMAL LOW (ref 150–400)
RBC: 4.55 MIL/uL (ref 3.87–5.11)
RDW: 15.7 % — ABNORMAL HIGH (ref 11.5–15.5)
WBC: 6.7 10*3/uL (ref 4.0–10.5)

## 2012-10-30 LAB — BASIC METABOLIC PANEL
CO2: 23 mEq/L (ref 19–32)
Calcium: 9 mg/dL (ref 8.4–10.5)
Glucose, Bld: 119 mg/dL — ABNORMAL HIGH (ref 70–99)
Potassium: 3.5 mEq/L (ref 3.5–5.1)
Sodium: 137 mEq/L (ref 135–145)

## 2012-10-30 LAB — HEPATIC FUNCTION PANEL
AST: 87 U/L — ABNORMAL HIGH (ref 0–37)
Albumin: 3.1 g/dL — ABNORMAL LOW (ref 3.5–5.2)
Alkaline Phosphatase: 147 U/L — ABNORMAL HIGH (ref 39–117)
Total Bilirubin: 3 mg/dL — ABNORMAL HIGH (ref 0.3–1.2)
Total Protein: 6.9 g/dL (ref 6.0–8.3)

## 2012-10-30 LAB — RAPID STREP SCREEN (MED CTR MEBANE ONLY): Streptococcus, Group A Screen (Direct): NEGATIVE

## 2012-10-30 LAB — CREATININE, SERUM: Creatinine, Ser: 0.59 mg/dL (ref 0.50–1.10)

## 2012-10-30 LAB — URINALYSIS, ROUTINE W REFLEX MICROSCOPIC
Glucose, UA: NEGATIVE mg/dL
Ketones, ur: NEGATIVE mg/dL
Leukocytes, UA: NEGATIVE
Nitrite: NEGATIVE
Protein, ur: NEGATIVE mg/dL
pH: 7 (ref 5.0–8.0)

## 2012-10-30 LAB — PROTIME-INR
INR: 1.22 (ref 0.00–1.49)
Prothrombin Time: 15.2 seconds (ref 11.6–15.2)

## 2012-10-30 LAB — CBC
Hemoglobin: 15.7 g/dL — ABNORMAL HIGH (ref 12.0–15.0)
MCH: 34.7 pg — ABNORMAL HIGH (ref 26.0–34.0)
MCHC: 35 g/dL (ref 30.0–36.0)
RDW: 16 % — ABNORMAL HIGH (ref 11.5–15.5)

## 2012-10-30 LAB — AMMONIA: Ammonia: 73 umol/L — ABNORMAL HIGH (ref 11–60)

## 2012-10-30 MED ORDER — ALBUTEROL SULFATE HFA 108 (90 BASE) MCG/ACT IN AERS
2.0000 | INHALATION_SPRAY | Freq: Four times a day (QID) | RESPIRATORY_TRACT | Status: DC | PRN
  Filled 2012-10-30: qty 6.7

## 2012-10-30 MED ORDER — PANTOPRAZOLE SODIUM 40 MG PO TBEC
40.0000 mg | DELAYED_RELEASE_TABLET | Freq: Every day | ORAL | Status: DC
  Administered 2012-10-30 – 2012-10-31 (×2): 40 mg via ORAL
  Filled 2012-10-30 (×2): qty 1

## 2012-10-30 MED ORDER — IPRATROPIUM-ALBUTEROL 18-103 MCG/ACT IN AERO
2.0000 | INHALATION_SPRAY | Freq: Four times a day (QID) | RESPIRATORY_TRACT | Status: DC
  Administered 2012-10-30 – 2012-10-31 (×2): 2 via RESPIRATORY_TRACT
  Filled 2012-10-30: qty 14.7

## 2012-10-30 MED ORDER — SODIUM CHLORIDE 0.9 % IV SOLN
INTRAVENOUS | Status: AC
  Administered 2012-10-30: 15:00:00 via INTRAVENOUS

## 2012-10-30 MED ORDER — PROPRANOLOL HCL 20 MG PO TABS
20.0000 mg | ORAL_TABLET | Freq: Every day | ORAL | Status: DC
  Administered 2012-10-30 – 2012-10-31 (×2): 20 mg via ORAL
  Filled 2012-10-30 (×3): qty 1

## 2012-10-30 MED ORDER — LACTULOSE 10 GM/15ML PO SOLN
30.0000 g | Freq: Once | ORAL | Status: AC
  Administered 2012-10-30: 30 g via ORAL
  Filled 2012-10-30: qty 45

## 2012-10-30 MED ORDER — ENOXAPARIN SODIUM 40 MG/0.4ML ~~LOC~~ SOLN
40.0000 mg | SUBCUTANEOUS | Status: DC
  Administered 2012-10-30: 40 mg via SUBCUTANEOUS
  Filled 2012-10-30 (×3): qty 0.4

## 2012-10-30 MED ORDER — ONDANSETRON HCL 4 MG/2ML IJ SOLN: 4.0000 mg | Freq: Three times a day (TID) | INTRAMUSCULAR | Status: AC | PRN

## 2012-10-30 NOTE — ED Notes (Signed)
RN to obtain labs with start of IV 

## 2012-10-30 NOTE — Progress Notes (Signed)
Met patient's brother out in hall. He told me he has lost a younger brother and an older sister already. He doesn't "want to lose any more." He was teary as he talked about the patient's illness. Offered support; presence; listening.

## 2012-10-30 NOTE — Progress Notes (Signed)
Patient transferred via [facility] to 6N16. Oriented to room. MD called and notified of transfer. Orders received, will continue to follow.

## 2012-10-30 NOTE — H&P (Signed)
Hospital Admission Note Date: 10/30/2012  Patient name: [name] [name] Medical record number: [id number] Date of birth: 1961-04-12 Age: 52 y.o. Gender: female PCP: Genelle Gather, MD  Medical Service: [facility]  Attending physician:  Dr. Criselda Peaches   1st Contact: Dr. Heloise Beecham   Pager:[phone] 2nd Contact: Dr. Bosie Clos   [phone] After 5 pm or weekends: 1st Contact:  Intern on call   Pager: [phone] 2nd Contact:  Resident on call  Pager: [phone]  Chief Complaint: Sore throat, L ear pain, visual hallucinations  History of Present Illness: [name] is a 52 year old female with past medical history of liver services secondary to hepatitis C and alcohol, COPD, and anxiety presenting with acute complaints of sore throat, runny nose, left ear pain as well as subacute onset of visual hallucinations. Patient reports that over the past several days she's experienced some nasal congestion, runny nose, sore throat and hoarseness. She denies any body aches, fever or chills. She also reports a cough that is occasionally productive of green sputum. She has no chest pain. No known exposures sick contacts. She's not tried anything to alleviate his symptoms. We were called to admit the patient to evaluate confusion in the setting of hepatitis C that was originally thought to be concerning for hepatic encephalopathy. Patient reports to [place] that since December of 2011, she's experienced visual hallucinations. She cannot describe the frequency of these hallucinations. Her most recent as last night at her home. She was sitting in the living room and saw a chimpanzee sitting next to her mother. The chimpanzee made eye contact with her, and then flipped her the bird. Last week, she hallucinated a baby sitting next to her mother and heard nursery music playing. She also occasionally hears voices, but cannot distinguish what they are saying. She has a history of anxiety, but no other psychiatric diagnosis. She denies  any alcohol use or illicit drug use. She follows with Dr. 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 Dr. Jacqualine Mau did not determine her to be a good candidate for treatment. She has never had hepatic encephalopathy.  She does report a chronic history of occasional blood in her stools. She has had a colonoscopy in 2010 which showed adenoma. Denies chest pain, shortness of breath, dizziness, headache, diarrhea, abdominal pain, dysuria, weakness.  Meds:   Medication List     As of 10/30/2012  5:00 PM             albuterol 108 (90 BASE) MCG/ACT inhaler   Commonly known as: PROVENTIL HFA;VENTOLIN HFA   Inhale 2 puffs into the lungs every 6 (six) hours as needed for wheezing.      albuterol-ipratropium 18-103 MCG/ACT inhaler   Commonly known as: COMBIVENT   Inhale 2 puffs into the lungs 4 (four) times daily.      amitriptyline 50 MG tablet   Commonly known as: ELAVIL   Take 150 mg by mouth at bedtime.      LORazepam 2 MG tablet   Commonly known as: ATIVAN   Take 2 mg by mouth every 8 (eight) hours as needed. For anxiety.      meloxicam 15 MG tablet   Commonly known as: MOBIC   Take 1 tablet (15 mg total) by mouth every 12 (twelve) hours.      pantoprazole 40 MG tablet   Commonly known as: PROTONIX   Take 1 tablet (40 mg total) by mouth daily.      propranolol 20 MG tablet   Commonly  known as: INDERAL   Take 1 tablet (20 mg total) by mouth daily.      traMADol 50 MG tablet   Commonly known as: ULTRAM   Take 50 mg by mouth every 6 (six) hours as needed. For pain.          Allergies: Allergies as of 10/30/2012  . (No Known Allergies)   Past Medical History  Diagnosis Date  . Alcoholic cirrhosis   . Hepatitis C     chronic hepatitis C and Steatohepatitis (hep grade 2, stage 2-3) per 05/13/08 liver biopsy  . COPD (chronic obstructive pulmonary disease)   . Heart palpitations   . cervical Cancer   . Anxiety   . Portal hypertension   . Esophageal  varices   . Cholelithiasis   . Splenomegaly   . Fatty liver   . Esophagitis 2010  . Tubulovillous adenoma of colon    Past Surgical History  Procedure Date  . Cervical cancer surgery   . Abdominal hysterectomy   . Tubal ligation   . Esophagogastroduodenoscopy 10/18/2011    Procedure: ESOPHAGOGASTRODUODENOSCOPY (EGD);  Surgeon: Rob Bunting, MD;  Location: [facility];  Service: Endoscopy;  Laterality: N/A;   Family History  Problem Relation Age of Onset  . Cervical cancer Sister   . Breast cancer Maternal Aunt   . Heart disease Father 59    died from MI  . Cervical cancer Daughter    History   Social History  . Marital Status: Married    Spouse Name: N/A    Number of Children: N/A  . Years of Education: N/A   Occupational History  . Not on file.   Social History Main Topics  . Smoking status: Former Smoker -- 0.1 packs/day for 40 years    Types: Cigarettes    Quit date: 10/16/2011  . Smokeless tobacco: Never Used     Comment: smoked about a week ago when she got upset-brother still smokes  . Alcohol Use: No     Comment: occa  . Drug Use: No  . Sexually Active: Not Currently    Birth Control/ Protection: Condom   Other Topics Concern  . Not on file   Social History Narrative  . No narrative on file    Review of Systems: 10 pt ROS performed, pertinent positives and negatives noted in HPI  Physical Exam: Blood pressure 156/91, pulse 62, temperature 97.9 F (36.6 C), temperature source Oral, resp. rate 20, SpO2 94.00%.  Gen: Laying in bed, wide eyed, no distress HEENT: Erythematous mucosa posterior oropharynx, small bilateral serous effusions of ears, absent dentition, pupils equal round reactive to light and accommodation, no scleral icterus, extraocular eye movements intact, no lymphadenopathy CV: Regular rate and rhythm no murmurs rubs or gallops Pulmonary: Clear to auscultation bilaterally Abdominal: Normoactive bowel sounds, mild tenderness of right  upper quadrant, no rebound or guarding, negative Murphy sign, splenomegaly not appreciated on exam Extremities: warm and well perfused Neurological: Patient is oriented to self, location, date the week, date, year. Cranial nerves II through XII grossly intact, normal strength and sensation of bilateral upper and lower terminates, no asterixis appreciated on exam Psych: Patient struggled with attention during exam. At times, she would stop talking midsentence and was cannot remember what she was saying. No active suicidal ideation. No active visual or auditory hallucinations at the time of our examination.   Lab results: Basic Metabolic Panel:  Basename 10/30/12 1553 10/30/12 1000  NA -- 137  K -- 3.5  CL -- 105  CO2 -- 23  GLUCOSE -- 119*  BUN -- 8  CREATININE 0.59 0.59  CALCIUM -- 9.0  MG -- --  PHOS -- --   Liver Function Tests:  Basename 10/30/12 1000  AST 87*  ALT 54*  ALKPHOS 147*  BILITOT 3.0*  PROT 6.9  ALBUMIN 3.1*    Basename 10/30/12 1000  AMMONIA 73*   CBC:  Basename 10/30/12 1553 10/30/12 1000  WBC 7.6 6.7  NEUTROABS -- 3.5  HGB 15.7* 15.2*  HCT 44.9 44.8  MCV 99.3 98.5  PLT 102* 107*   Coagulation:  Basename 10/30/12 1000  LABPROT 15.2  INR 1.22   Urine Drug Screen: Drugs of Abuse     Component Value Date/Time   LABOPIA NEG 07/22/2012 1527   COCAINSCRNUR NEG 07/22/2012 1527   LABBENZ PPS 07/22/2012 1527   LABBENZ NEG 05/14/2010 2116   AMPHETMU NEG 05/14/2010 2116   LABBARB NEG 07/22/2012 1527    Urine  Basename 10/30/12 1253  COLORURINE AMBER*  LABSPEC 1.015  PHURINE 7.0  GLUCOSEU NEGATIVE  HGBUR NEGATIVE  BILIRUBINUR SMALL*  KETONESUR NEGATIVE  PROTEINUR NEGATIVE  UROBILINOGEN 2.0*  NITRITE NEGATIVE  LEUKOCYTESUR NEGATIVE    Imaging results:  Dg Neck Soft Tissue  10/30/2012  *RADIOLOGY REPORT*  Clinical Data: Allergic reaction.  Dysphagia.  Scratchy  voice.  NECK SOFT TISSUES - 1+ VIEW  Comparison: 07/13/2011  Findings: Soft  tissue shadows are normal.  There is shadows are normal.  Chronic cervical spondylosis appears the same.  IMPRESSION: No radiographic acute finding.   Original Report Authenticated By: Paulina Fusi, M.D.    US Abdomen Complete  10/30/2012  *RADIOLOGY REPORT*  Clinical Data: Hepatitis C with cirrhosis.  ABDOMEN ULTRASOUND  Technique:  Complete abdominal ultrasound examination was performed including evaluation of the liver, gallbladder, bile ducts, pancreas, kidneys, spleen, IVC, and abdominal aorta.  Comparison: 10/07/2011  Findings:  Gallbladder:  Numerous stones noted within the gallbladder lumen. No gallbladder wall thickening or pericholecystic fluid.  The sonographer reports no sonographic Murphy's sign.  Common Bile Duct:  The upper normal diameter is 6 mm.  Liver:  Nodular peripheral contour is compatible with the reported clinical history of cirrhosis.  No intrahepatic biliary duct dilatation.  No intrahepatic mass lesion is evident.  IVC:  Visualized portions are unremarkable.  Pancreas:  Head and tail are not well seen secondary overlying bowel gas.  Spleen:  12 mm in cranial caudal length, upper normal.  Right kidney:  11.8 cm in long axis.  Normal.  Left kidney:  11.8 cm in long axis.  Normal.  Abdominal Aorta:  No aneurysm.  IMPRESSION: Nodular hepatic contour compatible with cirrhosis.  No focal intrahepatic parenchymal abnormality is evident.  MRI is a more sensitive means to investigate for hepatoma.  Cholelithiasis.   Original Report Authenticated By: Kennith Center, M.D.    Dg Chest Port 1 View  10/30/2012  *RADIOLOGY REPORT*  Clinical Data: Allergic reaction.  Dysphagia.  PORTABLE CHEST - 1 VIEW  Comparison: 07/15/2011  Findings: Artifact overlies the chest.  Heart size is normal. Mediastinal shadows are normal.  Mild chronic increased pulmonary markings appear the same.  No infiltrate, collapse or effusion.  No edema.  IMPRESSION: No change.  No active disease.  Slightly increased chronic  interstitial lung markings.   Original Report Authenticated By: Paulina Fusi, M.D.    Assessment & Plan by Problem: 1) Visual hallucinations Patient describes what sounds like 2 years of intermittent visual hallucinations which  have worsened over the past few weeks. Her clinical exam is not consistent with hepatic encephalopathy at this time (see #2 below). Suspected there is an underlying psychiatric etiology of her visual hallucinations. She does have a diagnosis of anxiety and concern for somatizations the past, and was instructed to followup at [facility]. She's not kept this followup. She had a CT scan of her head in September 2012 which showed no gross abnormalities of the brain. She does report starting amitriptyline recently and is concerned this medication may be making her feel funny. - Will consult psychiatry to evaluate primarily visual but occasionally auditory hallucinations and she's not appear to be encephalopathic from her liver disease at this time. Appreciate their recommendations.  - Will hold her amitriptyline at this time and benzodiazepine at this time to see if she has interval improvement in mental status  2) Cirrhosis secondary to hepatitis C and alcohol abuse Patient is to type I hepatitis C and followed by Dr. Jacqualine Mau hepatology. Her liver enzymes today are only mildly elevated without much change from prior labs in [place] 2013. Her ammonia elevation is only modest. She has normal renal function and INR, and only slightly elevated bilirubin. Her MELD score at this time is 9. She has no asterixis on exam he is completely oriented. She does not have any abdominal tenderness, fever, or leukocytosis concerning for spontaneous bacterial peritonitis.  She was last seen by her hepatologist in June of 2013. At that time, he thought she was a poor candidate for treatment of her hepatitis, given multiple somatic complaints. She has not had ascites or encephalopathy to treat in the past. She was  last screened for varices in December 2012, showing grade 1 esophageal varices. She's been prescribed nadolol in the past for prophylaxis. In the [place] long emergency room, she received an abdominal ultrasound which did not show any lesions concerning for hepatocellular carcinoma of the liver. - On propranolol for prophylaxis of esophageal varix bleed - Will give lactulose x1 - Abdominal U/S reassurance for no lesions in liver concerning for HCC  3) Viral URI Suspect viral URI responsible for recent hoarseness, rhinorrhea, and cough. Rapid strep negative. L ear pain but no exudative effusion noted. No acute lesions on CXR - no antibiotics indicated at this time.   4) COPD Carries diagnosis, no PFT on file. Have been ordered in outpatient setting. - Continue combivent inhaler   Dispo: Disposition is deferred at this time, awaiting improvement of current medical problems. Anticipated discharge in approximately 1-2 day(s).   The patient does have a current PCP Sherrine Maples, Aurelio Brash, MD), therefore {will berequiring [facility] follow-up after discharge.   The patient does not have transportation limitations that hinder transportation to clinic appointments.  Signed: Bronson Curb 10/30/2012, 5:22 PM

## 2012-10-30 NOTE — ED Provider Notes (Addendum)
History     CSN: [id number]  Arrival date & time 10/30/12  4098   First MD Initiated Contact with Patient 10/30/12 [phone]      Chief Complaint  Patient presents with  . Allergic Reaction    (Consider location/radiation/quality/duration/timing/severity/associated sxs/prior treatment) HPI Comments: Pt with hx of liver cirrhosis hep C, COPD, CERVICAL CA, S/P SURGERY, PORTAL HTN. Pt comes in with cc of confusion. Pt has been having confusion, visual hallucinations for the past 2 months. She reports being started on propanolol, and amitriptyline 2.5 months ago. No hx of hepatic encephalopathy, and is not on lactulose currently. ROR on infection screen is positive for sore throat with dysphagia, hoarseness, and slight difficulty with swallowing, intermittent left ear pain, cough with green phlegm. Pt has intermittent bloody stools  - not new. She denies any alcohol use or illicit substance use. No new meds besides the ones mentioned above. No hx of paracentesis. Her confusion is getting worse, so brother brought her to the ER.  Patient is a 52 y.o. female presenting with allergic reaction. The history is provided by the patient.  Allergic Reaction The primary symptoms are  shortness of breath, cough and palpitations. The primary symptoms do not include wheezing, abdominal pain, nausea, vomiting, diarrhea or rash.  The palpitations also occurred with shortness of breath.     Past Medical History  Diagnosis Date  . Alcoholic cirrhosis   . Hepatitis C     chronic hepatitis C and Steatohepatitis (hep grade 2, stage 2-3) per 05/13/08 liver biopsy  . COPD (chronic obstructive pulmonary disease)   . Heart palpitations   . cervical Cancer   . Anxiety   . Portal hypertension   . Esophageal varices   . Cholelithiasis   . Splenomegaly   . Fatty liver   . Esophagitis 2010  . Tubulovillous adenoma of colon     Past Surgical History  Procedure Date  . Cervical cancer surgery   . Abdominal  hysterectomy   . Tubal ligation   . Esophagogastroduodenoscopy 10/18/2011    Procedure: ESOPHAGOGASTRODUODENOSCOPY (EGD);  Surgeon: Rob Bunting, MD;  Location: [facility];  Service: Endoscopy;  Laterality: N/A;    Family History  Problem Relation Age of Onset  . Cervical cancer Sister   . Breast cancer Maternal Aunt   . Heart disease Father 29    died from MI  . Cervical cancer Daughter     History  Substance Use Topics  . Smoking status: Former Smoker -- 0.1 packs/day for 40 years    Types: Cigarettes    Quit date: 10/16/2011  . Smokeless tobacco: Never Used     Comment: smoked about a week ago when she got upset-brother still smokes  . Alcohol Use: No     Comment: occa    OB History    Grav Para Term Preterm Abortions TAB SAB Ect Mult Living   3 2   1  1   2       Review of Systems  Constitutional: Positive for chills and fatigue. Negative for fever and activity change.  HENT: Positive for sore throat. Negative for facial swelling, drooling and neck pain.   Respiratory: Positive for cough and shortness of breath. Negative for wheezing.   Cardiovascular: Positive for palpitations. Negative for chest pain and leg swelling.  Gastrointestinal: Positive for blood in stool. Negative for nausea, vomiting, abdominal pain, diarrhea, constipation and abdominal distention.  Genitourinary: Negative for hematuria and difficulty urinating.  Skin: Negative for color change  and rash.  Neurological: Negative for speech difficulty.  Hematological: Does not bruise/bleed easily.  Psychiatric/Behavioral: Negative for confusion.    Allergies  Review of patient's allergies indicates no known allergies.  Home Medications   Current Outpatient Rx  Name  Route  Sig  Dispense  Refill  . ALBUTEROL SULFATE HFA 108 (90 BASE) MCG/ACT IN AERS   Inhalation   Inhale 2 puffs into the lungs every 6 (six) hours as needed for wheezing.   1 Inhaler   4   . IPRATROPIUM-ALBUTEROL 18-103 MCG/ACT  IN AERO   Inhalation   Inhale 2 puffs into the lungs 4 (four) times daily.   1 Inhaler   2   . AMITRIPTYLINE HCL 50 MG PO TABS   Oral   Take 150 mg by mouth at bedtime.         Marland Kitchen LORAZEPAM 2 MG PO TABS   Oral   Take 2 mg by mouth every 8 (eight) hours as needed. For anxiety.         . MELOXICAM 15 MG PO TABS   Oral   Take 1 tablet (15 mg total) by mouth every 12 (twelve) hours.   30 tablet   2   . PANTOPRAZOLE SODIUM 40 MG PO TBEC   Oral   Take 1 tablet (40 mg total) by mouth daily.   30 tablet   11   . PROPRANOLOL HCL 20 MG PO TABS   Oral   Take 1 tablet (20 mg total) by mouth daily.   60 tablet   11   . TRAMADOL HCL 50 MG PO TABS   Oral   Take 50 mg by mouth every 6 (six) hours as needed. For pain.           BP 165/94  Pulse 76  Temp 98.4 F (36.9 C) (Oral)  Resp 20  SpO2 93%  Physical Exam  Nursing note and vitals reviewed. Constitutional: She appears well-developed.  HENT:  Head: Normocephalic and atraumatic.       Dry mucus membrane  Eyes: Conjunctivae normal and EOM are normal. Pupils are equal, round, and reactive to light. No scleral icterus.  Neck: Normal range of motion. Neck supple.  Cardiovascular: Normal rate, regular rhythm and normal heart sounds.   Pulmonary/Chest: Effort normal and breath sounds normal. No respiratory distress.  Abdominal: Soft. Bowel sounds are normal. She exhibits no distension. There is tenderness. There is no rebound and no guarding.       Diffuse tenderness, bedside US shows trace ascites, no appreciable pockets for bedside paracentesis in the lower quadrants.   Neurological: She is alert.       Oriented to self and place, was confused about date. No asterixis.  Skin: Skin is warm and dry. No rash noted. No pallor.    ED Course  [place] bedside Date/Time: 10/30/2012 10:37 AM Performed by: Derwood Kaplan Authorized by: Derwood Kaplan Consent: Verbal consent obtained. Consent given by: patient Patient  understanding: patient states understanding of the procedure being performed Comments: [place] FAST shows peritoneal fluid collection.   (including critical care time)  Labs Reviewed  CBC WITH DIFFERENTIAL - Abnormal; Notable for the following:    Hemoglobin 15.2 (*)     RDW 15.7 (*)     Monocytes Relative 13 (*)     All other components within normal limits  BASIC METABOLIC PANEL  HEPATIC FUNCTION PANEL  PROTIME-INR  APTT  AMMONIA  URINALYSIS, ROUTINE W REFLEX MICROSCOPIC  RAPID STREP  SCREEN   No results found.   No diagnosis found.    MDM  Pt comes in with cc of confusion and visual hallucinartions.  She has hx of liver cirrhosis, and it appears that this might be mild case of hepatic encephalopathy. She has never had similar sx in the past, never placed on lactulose. She has some confusion, but is still alert.  Unsure what the precipitating factor is. Could be the meds, but more likely inf or bleed. Will get the fecal occult blood screen soon. Bedside US was unremarkable - and peritonitis is not suspected. Infection screen otherwise will focus on UA, CXR and rapid strep screen and soft tissue neck films.     Derwood Kaplan, MD 10/30/12 1036  Derwood Kaplan, MD 10/30/12 1038

## 2012-10-30 NOTE — ED Notes (Signed)
Called report Nurse at lunch and will call back.

## 2012-10-30 NOTE — ED Notes (Signed)
Per patient, was placed on propranolol a month ago, started having hallucinations, unsteady gait, dizziness, weak-did not realize what was going on till last night and stopped meds

## 2012-10-30 NOTE — ED Notes (Signed)
Flushed saline lock. Working.

## 2012-10-31 DIAGNOSIS — F411 Generalized anxiety disorder: Secondary | ICD-10-CM

## 2012-10-31 LAB — BASIC METABOLIC PANEL
BUN: 9 mg/dL (ref 6–23)
Chloride: 108 mEq/L (ref 96–112)
Creatinine, Ser: 0.53 mg/dL (ref 0.50–1.10)
GFR calc non Af Amer: >90 mL/min (ref >90)
Glucose, Bld: 126 mg/dL — ABNORMAL HIGH (ref 70–99)
Potassium: 3.3 mEq/L — ABNORMAL LOW (ref 3.5–5.1)

## 2012-10-31 MED ORDER — POTASSIUM CHLORIDE CRYS ER 20 MEQ PO TBCR
40.0000 meq | EXTENDED_RELEASE_TABLET | Freq: Once | ORAL | Status: AC
  Administered 2012-10-31: 40 meq via ORAL
  Filled 2012-10-31: qty 2

## 2012-10-31 MED ORDER — LACTULOSE 10 GM/15ML PO SOLN: 30.0000 g | Freq: Two times a day (BID) | ORAL | Status: DC

## 2012-10-31 MED ORDER — LACTULOSE 10 GM/15ML PO SOLN
30.0000 g | Freq: Two times a day (BID) | ORAL | Status: DC
  Administered 2012-10-31: 30 g via ORAL
  Filled 2012-10-31 (×2): qty 45

## 2012-10-31 MED ORDER — POTASSIUM CHLORIDE CRYS ER 20 MEQ PO TBCR: 20.0000 meq | EXTENDED_RELEASE_TABLET | Freq: Every day | ORAL | Status: DC

## 2012-10-31 MED ORDER — LACTULOSE 10 GM/15ML PO SOLN: 30.0000 g | Freq: Every day | ORAL | Status: DC

## 2012-10-31 MED ORDER — IPRATROPIUM-ALBUTEROL 18-103 MCG/ACT IN AERO: 2.0000 | INHALATION_SPRAY | Freq: Two times a day (BID) | RESPIRATORY_TRACT | Status: DC

## 2012-10-31 MED ORDER — IPRATROPIUM-ALBUTEROL 18-103 MCG/ACT IN AERO: 2.0000 | INHALATION_SPRAY | Freq: Three times a day (TID) | RESPIRATORY_TRACT | Status: DC

## 2012-10-31 NOTE — Consult Note (Signed)
Reason for Consult: confusion, meds recs. Referring Physician: unknown  [name] [name] is an 52 y.o. female.  HPI:  Talked with primary team today. Per them pt need help with med adjustment now. Attempt was made to see him today around noon but he was not in the room. May be discharged home now.  Past Medical History  Diagnosis Date  . Alcoholic cirrhosis   . COPD (chronic obstructive pulmonary disease)   . Heart palpitations   . cervical Cancer   . Anxiety   . Portal hypertension   . Esophageal varices   . Cholelithiasis   . Splenomegaly   . Fatty liver   . Esophagitis 2010  . Tubulovillous adenoma of colon   . Visual hallucination     since 09/2010/notes 10/30/2012  . Hepatitis C     chronic hepatitis C and Steatohepatitis (hep grade 2, stage 2-3) per 05/13/08 liver biopsy    Past Surgical History  Procedure Date  . Cervical cancer surgery   . Abdominal hysterectomy   . Tubal ligation   . Esophagogastroduodenoscopy 10/18/2011    Procedure: ESOPHAGOGASTRODUODENOSCOPY (EGD);  Surgeon: Rob Bunting, MD;  Location: [facility];  Service: Endoscopy;  Laterality: N/A;    Family History  Problem Relation Age of Onset  . Cervical cancer Sister   . Breast cancer Maternal Aunt   . Heart disease Father 67    died from MI  . Cervical cancer Daughter     Social History:  reports that she quit smoking about 12 months ago. Her smoking use included Cigarettes. She has a 4 pack-year smoking history. She has never used smokeless tobacco. She reports that she does not drink alcohol or use illicit drugs.  Allergies: No Known Allergies  Medications: I have reviewed the patient's current medications.  Results for orders placed during the hospital encounter of 10/30/12 (from the past 48 hour(s))  CBC WITH DIFFERENTIAL     Status: Abnormal   Collection Time   10/30/12 10:00 AM      Component Value Range Comment   WBC 6.7  4.0 - 10.5 K/uL    RBC 4.55  3.87 - 5.11 MIL/uL    Hemoglobin  15.2 (*) 12.0 - 15.0 g/dL    HCT 40.9  81.1 - 91.4 %    MCV 98.5  78.0 - 100.0 fL    MCH 33.4  26.0 - 34.0 pg    MCHC 33.9  30.0 - 36.0 g/dL    RDW 78.2 (*) 95.6 - 15.5 %    Platelets 107 (*) 150 - 400 K/uL    Neutrophils Relative 52  43 - 77 %    Neutro Abs 3.5  1.7 - 7.7 K/uL    Lymphocytes Relative 33  12 - 46 %    Lymphs Abs 2.2  0.7 - 4.0 K/uL    Monocytes Relative 13 (*) 3 - 12 %    Monocytes Absolute 0.8  0.1 - 1.0 K/uL    Eosinophils Relative 2  0 - 5 %    Eosinophils Absolute 0.2  0.0 - 0.7 K/uL    Basophils Relative 1  0 - 1 %    Basophils Absolute 0.1  0.0 - 0.1 K/uL   BASIC METABOLIC PANEL     Status: Abnormal   Collection Time   10/30/12 10:00 AM      Component Value Range Comment   Sodium 137  135 - 145 mEq/L    Potassium 3.5  3.5 - 5.1 mEq/L  Chloride 105  96 - 112 mEq/L    CO2 23  19 - 32 mEq/L    Glucose, Bld 119 (*) 70 - 99 mg/dL    BUN 8  6 - 23 mg/dL    Creatinine, Ser 4.09  0.50 - 1.10 mg/dL    Calcium 9.0  8.4 - 81.1 mg/dL    GFR calc non Af Amer >90  >90 mL/min    GFR calc Af Amer >90  >90 mL/min   HEPATIC FUNCTION PANEL     Status: Abnormal   Collection Time   10/30/12 10:00 AM      Component Value Range Comment   Total Protein 6.9  6.0 - 8.3 g/dL    Albumin 3.1 (*) 3.5 - 5.2 g/dL    AST 87 (*) 0 - 37 U/L    ALT 54 (*) 0 - 35 U/L    Alkaline Phosphatase 147 (*) 39 - 117 U/L    Total Bilirubin 3.0 (*) 0.3 - 1.2 mg/dL    Bilirubin, Direct 0.9 (*) 0.0 - 0.3 mg/dL    Indirect Bilirubin 2.1 (*) 0.3 - 0.9 mg/dL   PROTIME-INR     Status: Normal   Collection Time   10/30/12 10:00 AM      Component Value Range Comment   Prothrombin Time 15.2  11.6 - 15.2 seconds    INR 1.22  0.00 - 1.49   APTT     Status: Normal   Collection Time   10/30/12 10:00 AM      Component Value Range Comment   aPTT 37  24 - 37 seconds   AMMONIA     Status: Abnormal   Collection Time   10/30/12 10:00 AM      Component Value Range Comment   Ammonia 73 (*) 11 - 60 umol/L   RAPID  STREP SCREEN     Status: Normal   Collection Time   10/30/12 10:25 AM      Component Value Range Comment   Streptococcus, Group A Screen (Direct) NEGATIVE  NEGATIVE   URINALYSIS, ROUTINE W REFLEX MICROSCOPIC     Status: Abnormal   Collection Time   10/30/12 12:53 PM      Component Value Range Comment   Color, Urine AMBER (*) YELLOW BIOCHEMICALS MAY BE AFFECTED BY COLOR   APPearance CLOUDY (*) CLEAR    Specific Gravity, Urine 1.015  1.005 - 1.030    pH 7.0  5.0 - 8.0    Glucose, UA NEGATIVE  NEGATIVE mg/dL    Hgb urine dipstick NEGATIVE  NEGATIVE    Bilirubin Urine SMALL (*) NEGATIVE    Ketones, ur NEGATIVE  NEGATIVE mg/dL    Protein, ur NEGATIVE  NEGATIVE mg/dL    Urobilinogen, UA 2.0 (*) 0.0 - 1.0 mg/dL    Nitrite NEGATIVE  NEGATIVE    Leukocytes, UA NEGATIVE  NEGATIVE MICROSCOPIC NOT DONE ON URINES WITH NEGATIVE PROTEIN, BLOOD, LEUKOCYTES, NITRITE, OR GLUCOSE <1000 mg/dL.  CBC     Status: Abnormal   Collection Time   10/30/12  3:53 PM      Component Value Range Comment   WBC 7.6  4.0 - 10.5 K/uL    RBC 4.52  3.87 - 5.11 MIL/uL    Hemoglobin 15.7 (*) 12.0 - 15.0 g/dL    HCT 91.4  78.2 - 95.6 %    MCV 99.3  78.0 - 100.0 fL    MCH 34.7 (*) 26.0 - 34.0 pg    MCHC 35.0  30.0 - 36.0 g/dL    RDW 19.1 (*) 47.8 - 15.5 %    Platelets 102 (*) 150 - 400 K/uL PLATELET COUNT CONFIRMED BY SMEAR  CREATININE, SERUM     Status: Normal   Collection Time   10/30/12  3:53 PM      Component Value Range Comment   Creatinine, Ser 0.59  0.50 - 1.10 mg/dL    GFR calc non Af Amer >90  >90 mL/min    GFR calc Af Amer >90  >90 mL/min   BASIC METABOLIC PANEL     Status: Abnormal   Collection Time   10/31/12  6:30 AM      Component Value Range Comment   Sodium 141  135 - 145 mEq/L    Potassium 3.3 (*) 3.5 - 5.1 mEq/L    Chloride 108  96 - 112 mEq/L    CO2 22  19 - 32 mEq/L    Glucose, Bld 126 (*) 70 - 99 mg/dL    BUN 9  6 - 23 mg/dL    Creatinine, Ser 2.95  0.50 - 1.10 mg/dL    Calcium 8.4  8.4 - 62.1  mg/dL    GFR calc non Af Amer >90  >90 mL/min    GFR calc Af Amer >90  >90 mL/min   PROTIME-INR     Status: Abnormal   Collection Time   10/31/12  6:30 AM      Component Value Range Comment   Prothrombin Time 15.4 (*) 11.6 - 15.2 seconds    INR 1.24  0.00 - 1.49     Dg Neck Soft Tissue  10/30/2012  *RADIOLOGY REPORT*  Clinical Data: Allergic reaction.  Dysphagia.  Scratchy  voice.  NECK SOFT TISSUES - 1+ VIEW  Comparison: 07/13/2011  Findings: Soft tissue shadows are normal.  There is shadows are normal.  Chronic cervical spondylosis appears the same.  IMPRESSION: No radiographic acute finding.   Original Report Authenticated By: Paulina Fusi, M.D.    US Abdomen Complete  10/30/2012  *RADIOLOGY REPORT*  Clinical Data: Hepatitis C with cirrhosis.  ABDOMEN ULTRASOUND  Technique:  Complete abdominal ultrasound examination was performed including evaluation of the liver, gallbladder, bile ducts, pancreas, kidneys, spleen, IVC, and abdominal aorta.  Comparison: 10/07/2011  Findings:  Gallbladder:  Numerous stones noted within the gallbladder lumen. No gallbladder wall thickening or pericholecystic fluid.  The sonographer reports no sonographic Murphy's sign.  Common Bile Duct:  The upper normal diameter is 6 mm.  Liver:  Nodular peripheral contour is compatible with the reported clinical history of cirrhosis.  No intrahepatic biliary duct dilatation.  No intrahepatic mass lesion is evident.  IVC:  Visualized portions are unremarkable.  Pancreas:  Head and tail are not well seen secondary overlying bowel gas.  Spleen:  12 mm in cranial caudal length, upper normal.  Right kidney:  11.8 cm in long axis.  Normal.  Left kidney:  11.8 cm in long axis.  Normal.  Abdominal Aorta:  No aneurysm.  IMPRESSION: Nodular hepatic contour compatible with cirrhosis.  No focal intrahepatic parenchymal abnormality is evident.  MRI is a more sensitive means to investigate for hepatoma.  Cholelithiasis.   Original Report  Authenticated By: Kennith Center, M.D.    Dg Chest Port 1 View  10/30/2012  *RADIOLOGY REPORT*  Clinical Data: Allergic reaction.  Dysphagia.  PORTABLE CHEST - 1 VIEW  Comparison: 07/15/2011  Findings: Artifact overlies the chest.  Heart size is normal. Mediastinal shadows are normal.  Mild chronic increased pulmonary markings appear the same.  No infiltrate, collapse or effusion.  No edema.  IMPRESSION: No change.  No active disease.  Slightly increased chronic interstitial lung markings.   Original Report Authenticated By: Paulina Fusi, M.D.     ROS Blood pressure 129/63, pulse 75, temperature 98.1 F (36.7 C), temperature source Oral, resp. rate 18, height 5\' 2"  (1.575 m), weight 75.342 kg (166 lb 1.6 oz), SpO2 94.00%. Physical Exam  Assessment/Plan: Please see above  Wonda Cerise 10/31/2012, 3:53 PM

## 2012-10-31 NOTE — Progress Notes (Signed)
Patient's home medications counted with patient at bedside and patient signed form. Home medications sent to pharmacy and form in pt chart.

## 2012-10-31 NOTE — Discharge Summary (Signed)
[facility] Medicine [facility] Discharge Note  Name: [name] [name] MRN: [id number] DOB: 1961/01/14 52 y.o.  Date of Admission: 10/30/2012  9:07 AM Date of Discharge: 10/31/2012 Attending Physician: Inez Catalina, MD  Discharge Diagnosis: Principal Problem:  *Visual hallucinations Active Problems:  Chronic hepatitis C with cirrhosis  COPD (chronic obstructive pulmonary disease)   Discharge Medications:   Medication List     As of 10/31/2012 11:34 AM    ASK your doctor about these medications         albuterol 108 (90 BASE) MCG/ACT inhaler   Commonly known as: PROVENTIL HFA;VENTOLIN HFA   Inhale 2 puffs into the lungs every 6 (six) hours as needed for wheezing.      albuterol-ipratropium 18-103 MCG/ACT inhaler   Commonly known as: COMBIVENT   Inhale 2 puffs into the lungs 4 (four) times daily.      amitriptyline 50 MG tablet   Commonly known as: ELAVIL   Take 150 mg by mouth at bedtime.      LORazepam 2 MG tablet   Commonly known as: ATIVAN   Take 2 mg by mouth every 8 (eight) hours as needed. For anxiety.      meloxicam 15 MG tablet   Commonly known as: MOBIC   Take 1 tablet (15 mg total) by mouth every 12 (twelve) hours.      pantoprazole 40 MG tablet   Commonly known as: PROTONIX   Take 1 tablet (40 mg total) by mouth daily.      propranolol 20 MG tablet   Commonly known as: INDERAL   Take 1 tablet (20 mg total) by mouth daily.      traMADol 50 MG tablet   Commonly known as: ULTRAM   Take 50 mg by mouth every 6 (six) hours as needed. For pain.        Disposition and follow-up:   EmilyCia Lin [name] was discharged from [facility] in good condition.  At the hospital follow up visit please address:   1) Visual hallucinations  Patient was originally admitted due to concern for hepatic encephalopathy; however upon evaluation for admission it became clear the patient is not suffering from hepatic encephalopathy, as her chronic visual  symptoms auditory hallucinations and been increasing in frequency recently. She's been referred to [facility] in the past but is not completed an evaluation. At followup visit, please encourage patient to followup at [facility] and with patient's permission, fax medical records to the provider that she has seen there in the past. We discontinued her amitriptyline and told her to back off of Ativan on discharge as these medications may have exacerbated her visual hallucinations. Please assess symptom improvement in the interim.  2) Cirrhosis secondary to hepatitis C and alcohol abuse No evidence of hepatic or metabolic encephalopathy during admission. Meld score 9. Stable LFTs. Liver ultrasound did not show lesions concerning for hepatocellular carcinoma. Has followed with Dr. Jacqualine Mau of hepatology, he did not think her a candidate for treatment of hepatitis in the past 2 to multiple somatic complaints. We started lactulose once daily upon discharge.  Follow-up Appointments:      Discharge Orders    Future Appointments: Provider: Department: Dept Phone: Center:   12/03/2012 2:15 PM Genelle Gather, MD [facility] [phone] [facility]   01/22/2013 11:30 AM Erick Colace, MD [facility] [phone] None      Consultations:  None  Procedures Performed:  Dg Neck Soft Tissue  10/30/2012  *  RADIOLOGY REPORT*  Clinical Data: Allergic reaction.  Dysphagia.  Scratchy  voice.  NECK SOFT TISSUES - 1+ VIEW  Comparison: 07/13/2011  Findings: Soft tissue shadows are normal.  There is shadows are normal.  Chronic cervical spondylosis appears the same.  IMPRESSION: No radiographic acute finding.   Original Report Authenticated By: Paulina Fusi, M.D.      US Abdomen Complete 10/30/2012  *RADIOLOGY REPORT*  Clinical Data: Hepatitis C with cirrhosis.  ABDOMEN ULTRASOUND  Technique:  Complete abdominal ultrasound examination was performed including evaluation of the liver,  gallbladder, bile ducts, pancreas, kidneys, spleen, IVC, and abdominal aorta.  Comparison: 10/07/2011  Findings:  Gallbladder:  Numerous stones noted within the gallbladder lumen. No gallbladder wall thickening or pericholecystic fluid.  The sonographer reports no sonographic Murphy's sign.  Common Bile Duct:  The upper normal diameter is 6 mm.  Liver:  Nodular peripheral contour is compatible with the reported clinical history of cirrhosis.  No intrahepatic biliary duct dilatation.  No intrahepatic mass lesion is evident.  IVC:  Visualized portions are unremarkable.  Pancreas:  Head and tail are not well seen secondary overlying bowel gas.  Spleen:  12 mm in cranial caudal length, upper normal.  Right kidney:  11.8 cm in long axis.  Normal.  Left kidney:  11.8 cm in long axis.  Normal.  Abdominal Aorta:  No aneurysm.  IMPRESSION: Nodular hepatic contour compatible with cirrhosis.  No focal intrahepatic parenchymal abnormality is evident.  MRI is a more sensitive means to investigate for hepatoma.  Cholelithiasis.   Original Report Authenticated By: Kennith Center, M.D.    Dg Chest Port 1 View  10/30/2012  *RADIOLOGY REPORT*  Clinical Data: Allergic reaction.  Dysphagia.  PORTABLE CHEST - 1 VIEW  Comparison: 07/15/2011  Findings: Artifact overlies the chest.  Heart size is normal. Mediastinal shadows are normal.  Mild chronic increased pulmonary markings appear the same.  No infiltrate, collapse or effusion.  No edema.  IMPRESSION: No change.  No active disease.  Slightly increased chronic interstitial lung markings.   Original Report Authenticated By: Paulina Fusi, M.D.    Admission HPI:  Ms. Lin [name] is a 52 year old female with past medical history of liver services secondary to hepatitis C and alcohol, COPD, and anxiety presenting with acute complaints of sore throat, runny nose, left ear pain as well as subacute onset of visual hallucinations.  Patient reports that over the past several days she's  experienced some nasal congestion, runny nose, sore throat and hoarseness. She denies any body aches, fever or chills. She also reports a cough that is occasionally productive of green sputum. She has no chest pain. No known exposures sick contacts. She's not tried anything to alleviate his symptoms.  We were called to admit the patient to evaluate confusion in the setting of hepatitis C that was originally thought to be concerning for hepatic encephalopathy. Patient reports to [place] that since December of 2011, she's experienced visual hallucinations. She cannot describe the frequency of these hallucinations. Her most recent as last night at her home. She was sitting in the living room and saw a chimpanzee sitting next to her mother. The chimpanzee made eye contact with her, and then flipped her the bird. Last week, she hallucinated a baby sitting next to her mother and heard nursery music playing. She also occasionally hears voices, but cannot distinguish what they are saying. She has a history of anxiety, but no other psychiatric diagnosis. She denies any alcohol use or illicit drug use.  She follows with Dr. 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 Dr. Jacqualine Mau did not determine her to be a good candidate for treatment. She has never had hepatic encephalopathy.  She does report a chronic history of occasional blood in her stools. She has had a colonoscopy in 2010 which showed adenoma.  Denies chest pain, shortness of breath, dizziness, headache, diarrhea, abdominal pain, dysuria, weakness.  Hospital Course by problem list: 1) Visual hallucinations Patient describes what sounds like 2 years of intermittent visual hallucinations which have worsened over the past few weeks. Her clinical exam was not consistent with hepatic encephalopathy during admission (see #2 below). Psych exam was most notable for impaired attention. Suspected there is an underlying psychiatric etiology of her visual  hallucinations. She does have a diagnosis of anxiety and concern for somatizations the past, and was instructed to followup at [facility]. She has not kept this followup. She had a CT scan of her head in September 2012 which showed no gross abnormalities of the brain. She does report starting amitriptyline recently and is concerned this medication may be making her feel funny. We discontinued amitriptyline and told her to decrease her Ativan usage upon discharge. She had one active hallucination during admission in which she reports single black scurry across her room. We encouraged her to followup with [facility] for evaluation of hallucinations and consideration for possible antipsychotic therapy. Told her that she may continue to experience his hallucinations at home. Patient was without any suicidal or homicidal ideation during admission and we felt she was safe for discharge. She will go home with her mother and brother who will care for her for a few days and help her followup with internal medicine outpatient and [facility] psychiatric services. Spoke extensively with patient and family, they're in agreement with plan. Patient says she is ready to leave the hospital.  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 Jacqualine Mau hepatology. Her liver enzymes at admission were only mildly elevated without much change from prior labs in June 2013. Her ammonia elevation was only modest. She had normal renal function and INR, and only slightly elevated bilirubin. Her MELD score was 9. She had no asterixis on exam was completely oriented. She did not have any abdominal tenderness, fever, or leukocytosis concerning for spontaneous bacterial peritonitis. She did not have altered mental status consistent with hepatic encephalopathy.  She was last seen by her hepatologist in June of 2013. At that time, he thought she was a poor candidate for treatment of her hepatitis,  given multiple somatic complaints. She has not had ascites or encephalopathy to treat in the past. She was last screened for varices in December 2012, showing grade 1 esophageal varices. She's been prescribed nadolol in the past for prophylaxis. In the [facility] emergency room, she received an abdominal ultrasound which did not show any lesions concerning for hepatocellular carcinoma of the liver. We continued her propranolol for prophylaxis as well as provided a dose of lactulose as patient reported some constipation. Discharged her on lactulose once daily.  3) Viral URI Suspect viral URI responsible for recent hoarseness, rhinorrhea, and cough. Rapid strep negative. L ear pain but no exudative effusion noted. No acute lesions on CXR. Patient improved with supportive care. No antibiotics.  4) COPD Carries diagnosis, no PFT on file. Have been ordered in outpatient setting. Combivent inhaler provided.   Discharge Vitals:  BP 129/63  Pulse 75  Temp 98.1 F (36.7 C) (Oral)  Resp 18  Ht 5\' 2"  (1.575 m)  Wt 166 lb 1.6 oz (75.342 kg)  BMI 30.38 kg/m2  SpO2 94%  Discharge Day PE: Gen: Sleeping in bed, no distress  HEENT: PERRL, EOMI, no icterus  CV: Regular rate and rhythm no murmurs rubs or gallops  Pulmonary: Clear to auscultation bilaterally  Abdominal: Normoactive bowel sounds, no abdominal tenderness, no rebound or guarding  Extremities: warm and well perfused  Neurological: Alert + oriented x3, CN 2-12 grossly intact, full strength and sensation bilateral upper and lower extremities  Psych: No current hallucinations, saw 1 black cat in hospital room last night   Discharge Labs:  Results for orders placed during the hospital encounter of 10/30/12 (from the past 24 hour(s))  URINALYSIS, ROUTINE W REFLEX MICROSCOPIC     Status: Abnormal   Collection Time   10/30/12 12:53 PM      Component Value Range   Color, Urine AMBER (*) YELLOW   APPearance CLOUDY (*) CLEAR   Specific Gravity,  Urine 1.015  1.005 - 1.030   pH 7.0  5.0 - 8.0   Glucose, UA NEGATIVE  NEGATIVE mg/dL   Hgb urine dipstick NEGATIVE  NEGATIVE   Bilirubin Urine SMALL (*) NEGATIVE   Ketones, ur NEGATIVE  NEGATIVE mg/dL   Protein, ur NEGATIVE  NEGATIVE mg/dL   Urobilinogen, UA 2.0 (*) 0.0 - 1.0 mg/dL   Nitrite NEGATIVE  NEGATIVE   Leukocytes, UA NEGATIVE  NEGATIVE  CBC     Status: Abnormal   Collection Time   10/30/12  3:53 PM      Component Value Range   WBC 7.6  4.0 - 10.5 K/uL   RBC 4.52  3.87 - 5.11 MIL/uL   Hemoglobin 15.7 (*) 12.0 - 15.0 g/dL   HCT 16.1  09.6 - 04.5 %   MCV 99.3  78.0 - 100.0 fL   MCH 34.7 (*) 26.0 - 34.0 pg   MCHC 35.0  30.0 - 36.0 g/dL   RDW 40.9 (*) 81.1 - 91.4 %   Platelets 102 (*) 150 - 400 K/uL  CREATININE, SERUM     Status: Normal   Collection Time   10/30/12  3:53 PM      Component Value Range   Creatinine, Ser 0.59  0.50 - 1.10 mg/dL   GFR calc non Af Amer >90  >90 mL/min   GFR calc Af Amer >90  >90 mL/min  BASIC METABOLIC PANEL     Status: Abnormal   Collection Time   10/31/12  6:30 AM      Component Value Range   Sodium 141  135 - 145 mEq/L   Potassium 3.3 (*) 3.5 - 5.1 mEq/L   Chloride 108  96 - 112 mEq/L   CO2 22  19 - 32 mEq/L   Glucose, Bld 126 (*) 70 - 99 mg/dL   BUN 9  6 - 23 mg/dL   Creatinine, Ser 7.82  0.50 - 1.10 mg/dL   Calcium 8.4  8.4 - 95.6 mg/dL   GFR calc non Af Amer >90  >90 mL/min   GFR calc Af Amer >90  >90 mL/min  PROTIME-INR     Status: Abnormal   Collection Time   10/31/12  6:30 AM      Component Value Range   Prothrombin Time 15.4 (*) 11.6 - 15.2 seconds   INR 1.24  0.00 - 1.49    Signed: Bronson Curb 10/31/2012, 11:34 AM  Time Spent on Discharge: 35 min Services Ordered on Discharge: none Equipment Ordered on Discharge: none

## 2012-10-31 NOTE — H&P (Signed)
Internal Medicine Teaching Service Attending Note Date: 10/31/2012  Patient name: [name] [name]  Medical record number: [id number]  Date of birth: Jan 08, 1961   I have seen and evaluated Alroy Bailiff and discussed their care with the Residency Team.    [name] is a 52yo woman who presented first to the [facility] ED for confusion.  [name] is not sure why she came to the hospital, except that her family was concerned for her and she may have had some abdominal pain yesterday.  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  On discussion with [name], she reports that over the past week she has had symptoms of a URI including chills, without documented fever, cough with sputum production, green, chest congestion and rhinorrhea.  She has also experienced some dizziness and unsteadiness on her feet without syncope.  She denies any body aches, chest pain or recent sick contacts.  She has not tried anything to assist with the symptoms over the counter.  Further associated symptom includes constipation, however, she cannot exactly remember how long it has been since her last bowel movement. She had one dose of lactulose in the ED.   On the day of admission, she had a very vivid visual hallucination involving a chimpanzee sitting next to her mother, as well as a baby sitting next to her mother that fell off the couch.  At that time she also heard nursery music being played.  [name] reports that during her recent illness she has not had any decreased PO intake for fluids or food.  She has a history of anxiety disorder, and was asked to follow up at [facility] for this issue, however, when she presented to this facility she was not sure why she was there, so the providers did not offer her any therapy.  She has recently been prescribed amitriptyline and she believes these symptoms may be  associated with that medication.  She is also on a benzodiazepine.  She has had hallucinations in the past going back to December of 2011, however, they have been intermittent and she thought they would just go away.  She does not have any command hallucinations, SI or HI.    She denies use of any illicit drugs or ETOH.  She does have hepatitis C and cirrhosis, but has never had a history of hepatic encephalopathy.    She further denies SOB, headache, diarrhea, abdominal pain, dysuria, weakness.    For further PMH, PSH, PFSH, meds, allergies, please see resident note.   Physical Exam: Blood pressure 129/63, pulse 75, temperature 98.1 F (36.7 C), temperature source Oral, resp. rate 18, height 5\' 2"  (1.575 m), weight 166 lb 1.6 oz (75.342 kg), SpO2 94.00%. General appearance: alert, cooperative, appears stated age, no distress and She is somewhat slow to make responses, but is completely oriented and able to tell her history fluently Head: Normocephalic, without obvious abnormality, atraumatic Eyes: anicteric sclerae, EOMI Throat: somewhy dry MM, no yellowing under tongue, erythema to oropharynx Back: symmetric, + tattoo on right shoulder, butterfly Lungs: clear to auscultation bilaterally and some crackles at bases, cleared with deep breaths, no wheeze Heart: RR, NR, no murmur Abdomen: soft, NT, ND, +BS.  No gross ascites Extremities: Thin extremities, dry skin in the feet/legs, brownish discoloration to right foot Pulses: 2+ and symmetric Skin: dry skin in the BLE, no stigmata of  liver disease noted Neurologic: Grossly normal, she is oriented and has equal strength and sensation to light touch, no asterixis Psych: She has somewhat decreased attention, but denies active hallucinations.   Lab results: Results for orders placed during the hospital encounter of 10/30/12 (from the past 24 hour(s))  CBC WITH DIFFERENTIAL     Status: Abnormal   Collection Time   10/30/12 10:00 AM      Component  Value Range   WBC 6.7  4.0 - 10.5 K/uL   RBC 4.55  3.87 - 5.11 MIL/uL   Hemoglobin 15.2 (*) 12.0 - 15.0 g/dL   HCT 16.1  09.6 - 04.5 %   MCV 98.5  78.0 - 100.0 fL   MCH 33.4  26.0 - 34.0 pg   MCHC 33.9  30.0 - 36.0 g/dL   RDW 40.9 (*) 81.1 - 91.4 %   Platelets 107 (*) 150 - 400 K/uL   Neutrophils Relative 52  43 - 77 %   Neutro Abs 3.5  1.7 - 7.7 K/uL   Lymphocytes Relative 33  12 - 46 %   Lymphs Abs 2.2  0.7 - 4.0 K/uL   Monocytes Relative 13 (*) 3 - 12 %   Monocytes Absolute 0.8  0.1 - 1.0 K/uL   Eosinophils Relative 2  0 - 5 %   Eosinophils Absolute 0.2  0.0 - 0.7 K/uL   Basophils Relative 1  0 - 1 %   Basophils Absolute 0.1  0.0 - 0.1 K/uL  BASIC METABOLIC PANEL     Status: Abnormal   Collection Time   10/30/12 10:00 AM      Component Value Range   Sodium 137  135 - 145 mEq/L   Potassium 3.5  3.5 - 5.1 mEq/L   Chloride 105  96 - 112 mEq/L   CO2 23  19 - 32 mEq/L   Glucose, Bld 119 (*) 70 - 99 mg/dL   BUN 8  6 - 23 mg/dL   Creatinine, Ser 7.82  0.50 - 1.10 mg/dL   Calcium 9.0  8.4 - 95.6 mg/dL   GFR calc non Af Amer >90  >90 mL/min   GFR calc Af Amer >90  >90 mL/min  HEPATIC FUNCTION PANEL     Status: Abnormal   Collection Time   10/30/12 10:00 AM      Component Value Range   Total Protein 6.9  6.0 - 8.3 g/dL   Albumin 3.1 (*) 3.5 - 5.2 g/dL   AST 87 (*) 0 - 37 U/L   ALT 54 (*) 0 - 35 U/L   Alkaline Phosphatase 147 (*) 39 - 117 U/L   Total Bilirubin 3.0 (*) 0.3 - 1.2 mg/dL   Bilirubin, Direct 0.9 (*) 0.0 - 0.3 mg/dL   Indirect Bilirubin 2.1 (*) 0.3 - 0.9 mg/dL  PROTIME-INR     Status: Normal   Collection Time   10/30/12 10:00 AM      Component Value Range   Prothrombin Time 15.2  11.6 - 15.2 seconds   INR 1.22  0.00 - 1.49  APTT     Status: Normal   Collection Time   10/30/12 10:00 AM      Component Value Range   aPTT 37  24 - 37 seconds  AMMONIA     Status: Abnormal   Collection Time   10/30/12 10:00 AM      Component Value Range   Ammonia 73 (*) 11 - 60 umol/L    RAPID STREP  SCREEN     Status: Normal   Collection Time   10/30/12 10:25 AM      Component Value Range   Streptococcus, Group A Screen (Direct) NEGATIVE  NEGATIVE  URINALYSIS, ROUTINE W REFLEX MICROSCOPIC     Status: Abnormal   Collection Time   10/30/12 12:53 PM      Component Value Range   Color, Urine AMBER (*) YELLOW   APPearance CLOUDY (*) CLEAR   Specific Gravity, Urine 1.015  1.005 - 1.030   pH 7.0  5.0 - 8.0   Glucose, UA NEGATIVE  NEGATIVE mg/dL   Hgb urine dipstick NEGATIVE  NEGATIVE   Bilirubin Urine SMALL (*) NEGATIVE   Ketones, ur NEGATIVE  NEGATIVE mg/dL   Protein, ur NEGATIVE  NEGATIVE mg/dL   Urobilinogen, UA 2.0 (*) 0.0 - 1.0 mg/dL   Nitrite NEGATIVE  NEGATIVE   Leukocytes, UA NEGATIVE  NEGATIVE  CBC     Status: Abnormal   Collection Time   10/30/12  3:53 PM      Component Value Range   WBC 7.6  4.0 - 10.5 K/uL   RBC 4.52  3.87 - 5.11 MIL/uL   Hemoglobin 15.7 (*) 12.0 - 15.0 g/dL   HCT 16.1  09.6 - 04.5 %   MCV 99.3  78.0 - 100.0 fL   MCH 34.7 (*) 26.0 - 34.0 pg   MCHC 35.0  30.0 - 36.0 g/dL   RDW 40.9 (*) 81.1 - 91.4 %   Platelets 102 (*) 150 - 400 K/uL  CREATININE, SERUM     Status: Normal   Collection Time   10/30/12  3:53 PM      Component Value Range   Creatinine, Ser 0.59  0.50 - 1.10 mg/dL   GFR calc non Af Amer >90  >90 mL/min   GFR calc Af Amer >90  >90 mL/min  BASIC METABOLIC PANEL     Status: Abnormal   Collection Time   10/31/12  6:30 AM      Component Value Range   Sodium 141  135 - 145 mEq/L   Potassium 3.3 (*) 3.5 - 5.1 mEq/L   Chloride 108  96 - 112 mEq/L   CO2 22  19 - 32 mEq/L   Glucose, Bld 126 (*) 70 - 99 mg/dL   BUN 9  6 - 23 mg/dL   Creatinine, Ser 7.82  0.50 - 1.10 mg/dL   Calcium 8.4  8.4 - 95.6 mg/dL   GFR calc non Af Amer >90  >90 mL/min   GFR calc Af Amer >90  >90 mL/min  PROTIME-INR     Status: Abnormal   Collection Time   10/31/12  6:30 AM      Component Value Range   Prothrombin Time 15.4 (*) 11.6 - 15.2 seconds   INR  1.24  0.00 - 1.49    Imaging results:  Dg Neck Soft Tissue  10/30/2012  *RADIOLOGY REPORT*  Clinical Data: Allergic reaction.  Dysphagia.  Scratchy  voice.  NECK SOFT TISSUES - 1+ VIEW  Comparison: 07/13/2011  Findings: Soft tissue shadows are normal.  There is shadows are normal.  Chronic cervical spondylosis appears the same.  IMPRESSION: No radiographic acute finding.   Original Report Authenticated By: Paulina Fusi, M.D.    US Abdomen Complete  10/30/2012  *RADIOLOGY REPORT*  Clinical Data: Hepatitis C with cirrhosis.  ABDOMEN ULTRASOUND  Technique:  Complete abdominal ultrasound examination was performed including evaluation of the liver, gallbladder, bile ducts, pancreas, kidneys,  spleen, IVC, and abdominal aorta.  Comparison: 10/07/2011  Findings:  Gallbladder:  Numerous stones noted within the gallbladder lumen. No gallbladder wall thickening or pericholecystic fluid.  The sonographer reports no sonographic Murphy's sign.  Common Bile Duct:  The upper normal diameter is 6 mm.  Liver:  Nodular peripheral contour is compatible with the reported clinical history of cirrhosis.  No intrahepatic biliary duct dilatation.  No intrahepatic mass lesion is evident.  IVC:  Visualized portions are unremarkable.  Pancreas:  Head and tail are not well seen secondary overlying bowel gas.  Spleen:  12 mm in cranial caudal length, upper normal.  Right kidney:  11.8 cm in long axis.  Normal.  Left kidney:  11.8 cm in long axis.  Normal.  Abdominal Aorta:  No aneurysm.  IMPRESSION: Nodular hepatic contour compatible with cirrhosis.  No focal intrahepatic parenchymal abnormality is evident.  MRI is a more sensitive means to investigate for hepatoma.  Cholelithiasis.   Original Report Authenticated By: Kennith Center, M.D.    Dg Chest Port 1 View  10/30/2012  *RADIOLOGY REPORT*  Clinical Data: Allergic reaction.  Dysphagia.  PORTABLE CHEST - 1 VIEW  Comparison: 07/15/2011  Findings: Artifact overlies the chest.  Heart size  is normal. Mediastinal shadows are normal.  Mild chronic increased pulmonary markings appear the same.  No infiltrate, collapse or effusion.  No edema.  IMPRESSION: No change.  No active disease.  Slightly increased chronic interstitial lung markings.   Original Report Authenticated By: Paulina Fusi, M.D.     Assessment and Plan: I agree with the formulated Assessment and Plan with the following changes:   1. Hallucinations, visual and occasional auditory - She does not have acute encephalopathy as she is completely oriented.  She has some attention deficits which could be related to early encephalopathy.  No asterixis.  Her LFTs are within her fluctuating baseline, likely due to her known cirrhosis - Her electrolytes are also normal, making metabolic encephalopathy more likely - This could be related to acute illness in the setting of a new medication, have held her BZD and amitriptyline - She reports feeling better this morning and has been up to the restroom without issue - Psych consult is pending, will contact them this AM - No SI/HI  2. Viral URI - She has no objective fever, no lesions on CXR, rapid strep is negative - Will treat conservatively, ensure proper hydration.  - Patient is already reporting feeling better  3. Chronic cirrhosis 2/2 hepatitis C and ETOH abuse (history of) - For full discussion, see resident note - Continue propranolol for prophylaxis - She has received one dose of lactulose, though her picture does not fit full blown hepatic encephalopathy - Would give lactulose to encourage bowel movement.   Other issues as per resident note.   Inez Catalina, MD 1/4/20149:38 AM

## 2012-10-31 NOTE — Progress Notes (Signed)
Subjective: Sleepy when I enter. Improvement of sore throat, ear pain and cough. Saw a black cat in the room last night, no other hallucinations.  Denies CP, SOB, N/V, dizziness, abdominal pain, diarrhea.  Objective: Vital signs in last 24 hours: Filed Vitals:   10/30/12 1824 10/30/12 2104 10/31/12 0454 10/31/12 0915  BP: 138/78 114/52 129/63   Pulse: 91 70 75   Temp:  98.3 F (36.8 C) 98.1 F (36.7 C)   TempSrc:  Oral Oral   Resp:  18 18   Height:   5\' 2"  (1.575 m)   Weight:   166 lb 1.6 oz (75.342 kg)   SpO2:  92% 93% 94%   Weight change:   Intake/Output Summary (Last 24 hours) at 10/31/12 0957 Last data filed at 10/31/12 0502  Gross per 24 hour  Intake      0 ml  Output    900 ml  Net   -900 ml  Vitals reviewed. Gen: Sleeping in bed, no distress HEENT: PERRL, EOMI, no icterus CV: Regular rate and rhythm no murmurs rubs or gallops  Pulmonary: Clear to auscultation bilaterally  Abdominal: Normoactive bowel sounds, no abdominal tenderness, no rebound or guarding Extremities: warm and well perfused  Neurological: Alert + oriented x3, CN 2-12 grossly intact, full strength and sensation bilateral upper and lower extremities Psych: No current hallucinations, saw 1 black cat in hospital room last night   Lab Results: Basic Metabolic Panel:  Lab 10/31/12 1610 10/30/12 1553 10/30/12 1000  NA 141 -- 137  K 3.3* -- 3.5  CL 108 -- 105  CO2 22 -- 23  GLUCOSE 126* -- 119*  BUN 9 -- 8  CREATININE 0.53 0.59 --  CALCIUM 8.4 -- 9.0  MG -- -- --  PHOS -- -- --   Liver Function Tests:  Lab 10/30/12 1000  AST 87*  ALT 54*  ALKPHOS 147*  BILITOT 3.0*  PROT 6.9  ALBUMIN 3.1*   Lab 10/30/12 1000  AMMONIA 73*   CBC:  Lab 10/30/12 1553 10/30/12 1000  WBC 7.6 6.7  NEUTROABS -- 3.5  HGB 15.7* 15.2*  HCT 44.9 44.8  MCV 99.3 98.5  PLT 102* 107*   Coagulation:  Lab 10/31/12 0630 10/30/12 1000  LABPROT 15.4* 15.2  INR 1.24 1.22   Urine Drug Screen: Drugs of Abuse       Component Value Date/Time   LABOPIA NEG 07/22/2012 1527   COCAINSCRNUR NEG 07/22/2012 1527   LABBENZ PPS 07/22/2012 1527   LABBENZ NEG 05/14/2010 2116   AMPHETMU NEG 05/14/2010 2116   LABBARB NEG 07/22/2012 1527    Urinalysis:  Lab 10/30/12 1253  COLORURINE AMBER*  LABSPEC 1.015  PHURINE 7.0  GLUCOSEU NEGATIVE  HGBUR NEGATIVE  BILIRUBINUR SMALL*  KETONESUR NEGATIVE  PROTEINUR NEGATIVE  UROBILINOGEN 2.0*  NITRITE NEGATIVE  LEUKOCYTESUR NEGATIVE   Micro Results: Recent Results (from the past 240 hour(s))  RAPID STREP SCREEN     Status: Normal   Collection Time   10/30/12 10:25 AM      Component Value Range Status Comment   Streptococcus, Group A Screen (Direct) NEGATIVE  NEGATIVE Final    Studies/Results: Dg Neck Soft Tissue  10/30/2012  *RADIOLOGY REPORT*  Clinical Data: Allergic reaction.  Dysphagia.  Scratchy  voice.  NECK SOFT TISSUES - 1+ VIEW  Comparison: 07/13/2011  Findings: Soft tissue shadows are normal.  There is shadows are normal.  Chronic cervical spondylosis appears the same.  IMPRESSION: No radiographic acute finding.   Original  Report Authenticated By: Paulina Fusi, M.D.    US Abdomen Complete  10/30/2012  *RADIOLOGY REPORT*  Clinical Data: Hepatitis C with cirrhosis.  ABDOMEN ULTRASOUND  Technique:  Complete abdominal ultrasound examination was performed including evaluation of the liver, gallbladder, bile ducts, pancreas, kidneys, spleen, IVC, and abdominal aorta.  Comparison: 10/07/2011  Findings:  Gallbladder:  Numerous stones noted within the gallbladder lumen. No gallbladder wall thickening or pericholecystic fluid.  The sonographer reports no sonographic Murphy's sign.  Common Bile Duct:  The upper normal diameter is 6 mm.  Liver:  Nodular peripheral contour is compatible with the reported clinical history of cirrhosis.  No intrahepatic biliary duct dilatation.  No intrahepatic mass lesion is evident.  IVC:  Visualized portions are unremarkable.  Pancreas:  Head  and tail are not well seen secondary overlying bowel gas.  Spleen:  12 mm in cranial caudal length, upper normal.  Right kidney:  11.8 cm in long axis.  Normal.  Left kidney:  11.8 cm in long axis.  Normal.  Abdominal Aorta:  No aneurysm.  IMPRESSION: Nodular hepatic contour compatible with cirrhosis.  No focal intrahepatic parenchymal abnormality is evident.  MRI is a more sensitive means to investigate for hepatoma.  Cholelithiasis.   Original Report Authenticated By: Kennith Center, M.D.    Dg Chest Port 1 View  10/30/2012  *RADIOLOGY REPORT*  Clinical Data: Allergic reaction.  Dysphagia.  PORTABLE CHEST - 1 VIEW  Comparison: 07/15/2011  Findings: Artifact overlies the chest.  Heart size is normal. Mediastinal shadows are normal.  Mild chronic increased pulmonary markings appear the same.  No infiltrate, collapse or effusion.  No edema.  IMPRESSION: No change.  No active disease.  Slightly increased chronic interstitial lung markings.   Original Report Authenticated By: Paulina Fusi, M.D.    Medications: I have reviewed the patient's current medications. Scheduled Meds:   . albuterol-ipratropium  2 puff Inhalation QID  . enoxaparin (LOVENOX) injection  40 mg Subcutaneous Q24H  . pantoprazole  40 mg Oral Daily  . propranolol  20 mg Oral Daily   Continuous Infusions:  PRN Meds:.albuterol  Assessment/Plan: [name] is a 52 year old female with past medical history of liver services secondary to hepatitis C and alcohol, COPD, and anxiety presenting with symptoms of acute URI and chronic visual hallucinations.  1) Visual hallucinations  Patient describes what sounds like 2 years of intermittent visual hallucinations which have worsened over the past few weeks. Her clinical exam is not consistent with hepatic encephalopathy at this time (see #2 below). No metabolic derangement to suggest other cause of encephalopathy. Suspected there is an underlying psychiatric etiology of her visual  hallucinations. No SI/HI. Do not believe that patient is a danger to herself or others at this time.  - Does not seem like acute onset encephalopathy. Spoke with psych for outpatient therapy recommendations. - Will consider adding antipsychotic agent, will need f/u w [facility] - Hopeful discharge today  2) Cirrhosis secondary to hepatitis C and alcohol abuse  No acute decompensation, evidence of hepatic encephalopathy or SBP. Stable LFTs, bili, Cr, and INR. Her MELD score at this time is 9. Abdominal U/S reassurance for no lesions in liver concerning for HCC.  - Continue propranolol for prophylaxis of esophageal varix bleed  - Continue lactulose  3) Viral URI  Suspect viral URI responsible for recent hoarseness, rhinorrhea, and cough. Rapid strep negative. L ear pain but no exudative effusion noted. No acute lesions on CXR. Feels better this morning - no antibiotics  indicated at this time.   4) COPD  Carries diagnosis, no PFTs on file. Have been ordered in outpatient setting.  - Continue combivent inhaler   Dispo: Disposition is deferred at this time, awaiting improvement of current medical problems.  Anticipated discharge in approximately 1 day(s).   The patient does have a current PCP Sherrine Maples, Aurelio Brash, MD), therefore will be requiring OPC follow-up after discharge.   The patient does not have transportation limitations that hinder transportation to clinic appointments.  .Services Needed at time of discharge: Y = Yes, Blank = No PT:   OT:   RN:   Equipment:   Other:     LOS: 1 day   Bronson Curb 10/31/2012, 9:57 AM

## 2012-11-06 ENCOUNTER — Telehealth: Payer: Self-pay | Admitting: *Deleted

## 2012-11-06 NOTE — Telephone Encounter (Signed)
Yes, she definitely needs to go to the ED. Thanks.

## 2012-11-06 NOTE — Telephone Encounter (Signed)
Pt calls and states she has gotten worse since disch from hosp, ask if she could come in to clinic today, she states no due to transportation, she is ask to go to ED asap, states as soon as she has transportation she will do so.

## 2012-11-07 ENCOUNTER — Encounter (HOSPITAL_COMMUNITY): Payer: Self-pay | Admitting: *Deleted

## 2012-11-07 ENCOUNTER — Observation Stay (HOSPITAL_COMMUNITY)
Admission: EM | Admit: 2012-11-07 | Discharge: 2012-11-08 | Disposition: A | Discharge status: Home / Self Care | Payer: No Typology Code available for payment source | Attending: Internal Medicine | Admitting: Internal Medicine

## 2012-11-07 DIAGNOSIS — R799 Abnormal finding of blood chemistry, unspecified: Principal | ICD-10-CM | POA: Insufficient documentation

## 2012-11-07 DIAGNOSIS — H5316 Psychophysical visual disturbances: Secondary | ICD-10-CM | POA: Insufficient documentation

## 2012-11-07 DIAGNOSIS — B182 Chronic viral hepatitis C: Secondary | ICD-10-CM | POA: Insufficient documentation

## 2012-11-07 DIAGNOSIS — K7030 Alcoholic cirrhosis of liver without ascites: Secondary | ICD-10-CM | POA: Insufficient documentation

## 2012-11-07 DIAGNOSIS — K7460 Unspecified cirrhosis of liver: Secondary | ICD-10-CM

## 2012-11-07 DIAGNOSIS — R443 Hallucinations, unspecified: Secondary | ICD-10-CM

## 2012-11-07 DIAGNOSIS — G8929 Other chronic pain: Secondary | ICD-10-CM

## 2012-11-07 DIAGNOSIS — J449 Chronic obstructive pulmonary disease, unspecified: Secondary | ICD-10-CM

## 2012-11-07 DIAGNOSIS — K766 Portal hypertension: Secondary | ICD-10-CM | POA: Insufficient documentation

## 2012-11-07 DIAGNOSIS — J4489 Other specified chronic obstructive pulmonary disease: Secondary | ICD-10-CM | POA: Insufficient documentation

## 2012-11-07 DIAGNOSIS — R441 Visual hallucinations: Secondary | ICD-10-CM | POA: Diagnosis present

## 2012-11-07 DIAGNOSIS — E7220 Disorder of urea cycle metabolism, unspecified: Secondary | ICD-10-CM

## 2012-11-07 DIAGNOSIS — F99 Mental disorder, not otherwise specified: Secondary | ICD-10-CM

## 2012-11-07 DIAGNOSIS — F1010 Alcohol abuse, uncomplicated: Secondary | ICD-10-CM | POA: Insufficient documentation

## 2012-11-07 DIAGNOSIS — F419 Anxiety disorder, unspecified: Secondary | ICD-10-CM

## 2012-11-07 DIAGNOSIS — R4182 Altered mental status, unspecified: Secondary | ICD-10-CM

## 2012-11-07 DIAGNOSIS — R490 Dysphonia: Secondary | ICD-10-CM | POA: Insufficient documentation

## 2012-11-07 DIAGNOSIS — Z8619 Personal history of other infectious and parasitic diseases: Secondary | ICD-10-CM | POA: Diagnosis present

## 2012-11-07 LAB — AMMONIA: Ammonia: 100 umol/L — ABNORMAL HIGH (ref 11–60)

## 2012-11-07 LAB — COMPREHENSIVE METABOLIC PANEL
ALT: 49 U/L — ABNORMAL HIGH (ref 0–35)
Alkaline Phosphatase: 146 U/L — ABNORMAL HIGH (ref 39–117)
BUN: 10 mg/dL (ref 6–23)
CO2: 26 mEq/L (ref 19–32)
GFR calc Af Amer: >90 mL/min (ref >90)
GFR calc non Af Amer: >90 mL/min (ref >90)
Glucose, Bld: 81 mg/dL (ref 70–99)
Potassium: 4 mEq/L (ref 3.5–5.1)
Sodium: 138 mEq/L (ref 135–145)

## 2012-11-07 LAB — PROTIME-INR: INR: 1.24 (ref 0.00–1.49)

## 2012-11-07 LAB — CBC
MCHC: 34.4 g/dL (ref 30.0–36.0)
Platelets: 123 10*3/uL — ABNORMAL LOW (ref 150–400)
RDW: 15.2 % (ref 11.5–15.5)

## 2012-11-07 LAB — RAPID URINE DRUG SCREEN, HOSP PERFORMED
Cocaine: NOT DETECTED
Opiates: NOT DETECTED

## 2012-11-07 MED ORDER — LACTULOSE 10 GM/15ML PO SOLN
20.0000 g | Freq: Once | ORAL | Status: AC
  Administered 2012-11-08: 20 g via ORAL
  Filled 2012-11-07: qty 30

## 2012-11-07 NOTE — ED Notes (Signed)
[employer] bedside to serve pt IVC paperwork

## 2012-11-07 NOTE — ED Provider Notes (Addendum)
History     CSN: [id number]  Arrival date & time 11/07/12  1842   First MD Initiated Contact with Patient 11/07/12 1848      Chief Complaint  Patient presents with  . Medical Clearance    (Consider location/radiation/quality/duration/timing/severity/associated sxs/prior treatment) The history is provided by the patient and the police. The history is limited by the condition of the patient.  pt with hx etoh abuse, unspecific psych hx, presents after running from home a few hours ago. Lives w parent. Recent d/c from hospital a few days ago, pt txd for variety of symptoms including hallucinations then, was felt to be psych related rather than due to hepatic encephalopathy.  Pt not compliant w meds. Family reports to police strange behavior, concerned about pts safety. Pt uncooperative w history, states nothing is wrong, family is crazy. Level 5 caveat - psych issues and uncooperativeness.   Past Medical History  Diagnosis Date  . Alcoholic cirrhosis   . COPD (chronic obstructive pulmonary disease)   . Heart palpitations   . cervical Cancer   . Anxiety   . Portal hypertension   . Esophageal varices   . Cholelithiasis   . Splenomegaly   . Fatty liver   . Esophagitis 2010  . Tubulovillous adenoma of colon   . Visual hallucination     since 09/2010/notes 10/30/2012  . Hepatitis C     chronic hepatitis C and Steatohepatitis (hep grade 2, stage 2-3) per 05/13/08 liver biopsy    Past Surgical History  Procedure Date  . Cervical cancer surgery   . Abdominal hysterectomy   . Tubal ligation   . Esophagogastroduodenoscopy 10/18/2011    Procedure: ESOPHAGOGASTRODUODENOSCOPY (EGD);  Surgeon: Rob Bunting, MD;  Location: [facility];  Service: Endoscopy;  Laterality: N/A;    Family History  Problem Relation Age of Onset  . Cervical cancer Sister   . Breast cancer Maternal Aunt   . Heart disease Father 46    died from MI  . Cervical cancer Daughter     History  Substance Use  Topics  . Smoking status: Former Smoker -- 0.1 packs/day for 40 years    Types: Cigarettes    Quit date: 10/16/2011  . Smokeless tobacco: Never Used     Comment: smoked about a week ago when she got upset-brother still smokes  . Alcohol Use: No     Comment: occa    OB History    Grav Para Term Preterm Abortions TAB SAB Ect Mult Living   3 2   1  1   2       Review of Systems  Unable to perform ROS: Psychiatric disorder  level 5 caveat  Allergies  Review of patient's allergies indicates no known allergies.  Home Medications   Current Outpatient Rx  Name  Route  Sig  Dispense  Refill  . ALBUTEROL SULFATE HFA 108 (90 BASE) MCG/ACT IN AERS   Inhalation   Inhale 2 puffs into the lungs every 6 (six) hours as needed for wheezing.   1 Inhaler   4   . IPRATROPIUM-ALBUTEROL 18-103 MCG/ACT IN AERO   Inhalation   Inhale 2 puffs into the lungs 4 (four) times daily.   1 Inhaler   2   . LACTULOSE 10 GM/15ML PO SOLN   Oral   Take 45 mLs (30 g total) by mouth daily.   240 mL   2   . MELOXICAM 15 MG PO TABS   Oral   Take 1  tablet (15 mg total) by mouth every 12 (twelve) hours.   30 tablet   2   . PANTOPRAZOLE SODIUM 40 MG PO TBEC   Oral   Take 1 tablet (40 mg total) by mouth daily.   30 tablet   11   . POTASSIUM CHLORIDE CRYS ER 20 MEQ PO TBCR   Oral   Take 1 tablet (20 mEq total) by mouth daily.   30 tablet   2   . PROPRANOLOL HCL 20 MG PO TABS   Oral   Take 1 tablet (20 mg total) by mouth daily.   60 tablet   11   . TRAMADOL HCL 50 MG PO TABS   Oral   Take 50 mg by mouth every 6 (six) hours as needed. For pain.           There were no vitals taken for this visit.  Physical Exam  Nursing note and vitals reviewed. Constitutional: She appears well-developed and well-nourished. No distress.  HENT:  Head: Atraumatic.  Mouth/Throat: Oropharynx is clear and moist.  Eyes: Conjunctivae normal are normal. No scleral icterus.  Neck: Neck supple. No tracheal  deviation present. No thyromegaly present.  Cardiovascular: Normal rate, regular rhythm, normal heart sounds and intact distal pulses.   Pulmonary/Chest: Effort normal and breath sounds normal. No respiratory distress.  Abdominal: Soft. Normal appearance and bowel sounds are normal. She exhibits no distension. There is no tenderness.  Genitourinary:       No cva tenderness  Musculoskeletal: She exhibits no edema and no tenderness.  Neurological: She is alert.       Pt uncooperative. Ambulates w steady gait. Moves bil ext purposefully.   Skin: Skin is warm and dry. No rash noted.  Psychiatric:       Anxious, mildly agitated.     ED Course  Procedures (including critical care time)   Labs Reviewed  COMPREHENSIVE METABOLIC PANEL  CBC  URINE RAPID DRUG SCREEN (HOSP PERFORMED)  ETHANOL  PROTIME-INR  AMMONIA    Results for orders placed during the hospital encounter of 11/07/12  COMPREHENSIVE METABOLIC PANEL      Component Value Range   Sodium 138  135 - 145 mEq/L   Potassium 4.0  3.5 - 5.1 mEq/L   Chloride 104  96 - 112 mEq/L   CO2 26  19 - 32 mEq/L   Glucose, Bld 81  70 - 99 mg/dL   BUN 10  6 - 23 mg/dL   Creatinine, Ser 8.11  0.50 - 1.10 mg/dL   Calcium 8.9  8.4 - 91.4 mg/dL   Total Protein 7.1  6.0 - 8.3 g/dL   Albumin 3.2 (*) 3.5 - 5.2 g/dL   AST 85 (*) 0 - 37 U/L   ALT 49 (*) 0 - 35 U/L   Alkaline Phosphatase 146 (*) 39 - 117 U/L   Total Bilirubin 2.6 (*) 0.3 - 1.2 mg/dL   GFR calc non Af Amer >90  >90 mL/min   GFR calc Af Amer >90  >90 mL/min  CBC      Component Value Range   WBC 8.6  4.0 - 10.5 K/uL   RBC 4.69  3.87 - 5.11 MIL/uL   Hemoglobin 16.4 (*) 12.0 - 15.0 g/dL   HCT 78.2 (*) 95.6 - 21.3 %   MCV 101.7 (*) 78.0 - 100.0 fL   MCH 35.0 (*) 26.0 - 34.0 pg   MCHC 34.4  30.0 - 36.0 g/dL   RDW 08.6  11.5 - 15.5 %   Platelets 123 (*) 150 - 400 K/uL  ETHANOL      Component Value Range   Alcohol, Ethyl (B) <11  0 - 11 mg/dL  PROTIME-INR      Component Value  Range   Prothrombin Time 15.4 (*) 11.6 - 15.2 seconds   INR 1.24  0.00 - 1.49  AMMONIA      Component Value Range   Ammonia 100 (*) 11 - 60 umol/L   Dg Neck Soft Tissue  10/30/2012  *RADIOLOGY REPORT*  Clinical Data: Allergic reaction.  Dysphagia.  Scratchy  voice.  NECK SOFT TISSUES - 1+ VIEW  Comparison: 07/13/2011  Findings: Soft tissue shadows are normal.  There is shadows are normal.  Chronic cervical spondylosis appears the same.  IMPRESSION: No radiographic acute finding.   Original Report Authenticated By: Paulina Fusi, M.D.    US Abdomen Complete  10/30/2012  *RADIOLOGY REPORT*  Clinical Data: Hepatitis C with cirrhosis.  ABDOMEN ULTRASOUND  Technique:  Complete abdominal ultrasound examination was performed including evaluation of the liver, gallbladder, bile ducts, pancreas, kidneys, spleen, IVC, and abdominal aorta.  Comparison: 10/07/2011  Findings:  Gallbladder:  Numerous stones noted within the gallbladder lumen. No gallbladder wall thickening or pericholecystic fluid.  The sonographer reports no sonographic Murphy's sign.  Common Bile Duct:  The upper normal diameter is 6 mm.  Liver:  Nodular peripheral contour is compatible with the reported clinical history of cirrhosis.  No intrahepatic biliary duct dilatation.  No intrahepatic mass lesion is evident.  IVC:  Visualized portions are unremarkable.  Pancreas:  Head and tail are not well seen secondary overlying bowel gas.  Spleen:  12 mm in cranial caudal length, upper normal.  Right kidney:  11.8 cm in long axis.  Normal.  Left kidney:  11.8 cm in long axis.  Normal.  Abdominal Aorta:  No aneurysm.  IMPRESSION: Nodular hepatic contour compatible with cirrhosis.  No focal intrahepatic parenchymal abnormality is evident.  MRI is a more sensitive means to investigate for hepatoma.  Cholelithiasis.   Original Report Authenticated By: Kennith Center, M.D.    Dg Chest Port 1 View  10/30/2012  *RADIOLOGY REPORT*  Clinical Data: Allergic reaction.   Dysphagia.  PORTABLE CHEST - 1 VIEW  Comparison: 07/15/2011  Findings: Artifact overlies the chest.  Heart size is normal. Mediastinal shadows are normal.  Mild chronic increased pulmonary markings appear the same.  No infiltrate, collapse or effusion.  No edema.  IMPRESSION: No change.  No active disease.  Slightly increased chronic interstitial lung markings.   Original Report Authenticated By: Paulina Fusi, M.D.       MDM  Labs.  Reviewed nursing notes and prior charts for additional history.   Given elevated ammonia level, will be unable to place into psych facility until improved, as such will need medical admit/obs stay w psych consultation.  As pt was just discharged from int med service at [facility], I spoke w resident on call, Dr Clyde Lundborg, he accepts in transfer to gen med bed to Dr Eben Burow.        Suzi Roots, MD 11/07/12 2055  Suzi Roots, MD 11/07/12 [phone]

## 2012-11-07 NOTE — ED Notes (Signed)
[employer] arrived with pt, states she was released from hospital last week, staying with family, today packed bags and ran out of house. Family reports pt needs eval for meds, states she likes drinking herself into a deep sleep, vague description of reason they are taking out IVC papers

## 2012-11-07 NOTE — ED Notes (Signed)
Pt has ivc papers that states she has hallucinations and poor judgement

## 2012-11-08 ENCOUNTER — Encounter (HOSPITAL_COMMUNITY): Payer: Self-pay | Admitting: *Deleted

## 2012-11-08 DIAGNOSIS — R4182 Altered mental status, unspecified: Secondary | ICD-10-CM

## 2012-11-08 DIAGNOSIS — K7460 Unspecified cirrhosis of liver: Secondary | ICD-10-CM

## 2012-11-08 DIAGNOSIS — B182 Chronic viral hepatitis C: Secondary | ICD-10-CM

## 2012-11-08 DIAGNOSIS — J4489 Other specified chronic obstructive pulmonary disease: Secondary | ICD-10-CM

## 2012-11-08 DIAGNOSIS — R490 Dysphonia: Secondary | ICD-10-CM

## 2012-11-08 DIAGNOSIS — F1010 Alcohol abuse, uncomplicated: Secondary | ICD-10-CM

## 2012-11-08 DIAGNOSIS — J449 Chronic obstructive pulmonary disease, unspecified: Secondary | ICD-10-CM

## 2012-11-08 LAB — BASIC METABOLIC PANEL
CO2: 22 mEq/L (ref 19–32)
Calcium: 8.8 mg/dL (ref 8.4–10.5)
Creatinine, Ser: 0.45 mg/dL — ABNORMAL LOW (ref 0.50–1.10)
GFR calc Af Amer: >90 mL/min (ref >90)
GFR calc non Af Amer: >90 mL/min (ref >90)
Sodium: 138 mEq/L (ref 135–145)

## 2012-11-08 MED ORDER — TRAMADOL HCL 50 MG PO TABS
50.0000 mg | ORAL_TABLET | Freq: Four times a day (QID) | ORAL | Status: DC | PRN
  Filled 2012-11-08: qty 1

## 2012-11-08 MED ORDER — POTASSIUM CHLORIDE CRYS ER 20 MEQ PO TBCR
40.0000 meq | EXTENDED_RELEASE_TABLET | Freq: Once | ORAL | Status: AC
  Administered 2012-11-08: 40 meq via ORAL
  Filled 2012-11-08: qty 2

## 2012-11-08 MED ORDER — ALBUTEROL SULFATE HFA 108 (90 BASE) MCG/ACT IN AERS: 2.0000 | INHALATION_SPRAY | Freq: Four times a day (QID) | RESPIRATORY_TRACT | Status: DC | PRN

## 2012-11-08 MED ORDER — HEPARIN SODIUM (PORCINE) 5000 UNIT/ML IJ SOLN
5000.0000 [IU] | Freq: Three times a day (TID) | INTRAMUSCULAR | Status: DC
  Filled 2012-11-08 (×3): qty 1

## 2012-11-08 MED ORDER — SODIUM CHLORIDE 0.9 % IJ SOLN: 3.0000 mL | INTRAMUSCULAR | Status: DC | PRN

## 2012-11-08 MED ORDER — LORAZEPAM 1 MG PO TABS
1.0000 mg | ORAL_TABLET | Freq: Three times a day (TID) | ORAL | Status: DC | PRN
  Administered 2012-11-08: 1 mg via ORAL
  Filled 2012-11-08: qty 1

## 2012-11-08 MED ORDER — LACTULOSE 10 GM/15ML PO SOLN: 30.0000 g | Freq: Every day | ORAL | Status: DC

## 2012-11-08 MED ORDER — IPRATROPIUM-ALBUTEROL 18-103 MCG/ACT IN AERO
2.0000 | INHALATION_SPRAY | Freq: Four times a day (QID) | RESPIRATORY_TRACT | Status: DC
  Administered 2012-11-08 (×2): 2 via RESPIRATORY_TRACT
  Filled 2012-11-08: qty 14.7

## 2012-11-08 MED ORDER — SODIUM CHLORIDE 0.9 % IV SOLN: 250.0000 mL | INTRAVENOUS | Status: DC | PRN

## 2012-11-08 MED ORDER — POTASSIUM CHLORIDE CRYS ER 20 MEQ PO TBCR: 20.0000 meq | EXTENDED_RELEASE_TABLET | Freq: Every day | ORAL | Status: DC

## 2012-11-08 MED ORDER — PROPRANOLOL HCL 20 MG PO TABS
20.0000 mg | ORAL_TABLET | Freq: Every day | ORAL | Status: DC
  Administered 2012-11-08: 20 mg via ORAL
  Filled 2012-11-08: qty 1

## 2012-11-08 MED ORDER — FOLIC ACID 1 MG PO TABS
1.0000 mg | ORAL_TABLET | Freq: Every day | ORAL | Status: DC
  Administered 2012-11-08: 1 mg via ORAL
  Filled 2012-11-08: qty 1

## 2012-11-08 MED ORDER — VITAMIN B-1 100 MG PO TABS
100.0000 mg | ORAL_TABLET | Freq: Every day | ORAL | Status: DC
  Administered 2012-11-08: 100 mg via ORAL
  Filled 2012-11-08: qty 1

## 2012-11-08 MED ORDER — MELOXICAM 15 MG PO TABS
15.0000 mg | ORAL_TABLET | Freq: Two times a day (BID) | ORAL | Status: DC
  Administered 2012-11-08: 15 mg via ORAL
  Filled 2012-11-08 (×3): qty 1

## 2012-11-08 MED ORDER — PANTOPRAZOLE SODIUM 40 MG PO TBEC
40.0000 mg | DELAYED_RELEASE_TABLET | Freq: Every day | ORAL | Status: DC
  Administered 2012-11-08: 40 mg via ORAL
  Filled 2012-11-08: qty 1

## 2012-11-08 MED ORDER — SODIUM CHLORIDE 0.9 % IJ SOLN
3.0000 mL | Freq: Two times a day (BID) | INTRAMUSCULAR | Status: DC
  Administered 2012-11-08: 3 mL via INTRAVENOUS

## 2012-11-08 NOTE — Discharge Summary (Signed)
Internal Medicine Teaching [facility] Discharge Note  Name: [name] [name] MRN: [id number] DOB: 1961-03-24 52 y.o.  Date of Admission: 11/07/2012  6:42 PM Date of Discharge: 11/08/2012 Attending Physician: No att. 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      albuterol-ipratropium 18-103 MCG/ACT inhaler   Commonly known as: COMBIVENT   Inhale 2 puffs into the lungs 4 (four) times daily.      lactulose 10 GM/15ML solution   Commonly known as: CHRONULAC   Take 45 mLs (30 g total) by mouth daily.      meloxicam 15 MG tablet   Commonly known as: MOBIC   Take 1 tablet (15 mg total) by mouth every 12 (twelve) hours.      pantoprazole 40 MG tablet   Commonly known as: PROTONIX   Take 1 tablet (40 mg total) by mouth daily.      potassium chloride SA 20 MEQ tablet   Commonly known as: K-DUR,KLOR-CON   Take 1 tablet (20 mEq total) by mouth daily.      propranolol 20 MG tablet   Commonly known as: INDERAL   Take 1 tablet (20 mg total) by mouth daily.      traMADol 50 MG tablet   Commonly known as: ULTRAM   Take 50 mg by mouth every 6 (six) hours as needed. For pain.         Disposition and follow-up:   Ms.Gayanne Lin Givens was discharged from [facility] in good condition.  At the hospital follow up visit please address   1) Visual hallucinations  Patient was originally admitted due to concern for hepatic encephalopathy; however upon evaluation for admission it became clear the patient is not  suffering from hepatic encephalopathy, as her chronic visual symptoms auditory hallucinations and been increasing in frequency recently. She has been referred to [facility] in the past but has not completed an evaluation. At followup visit, please encourage patient to followup at [facility] and with patient's permission, fax medical records to the provider that she has seen there in the past.   2) Cirrhosis secondary to hepatitis C and alcohol abuse  No evidence of hepatic or metabolic encephalopathy during admission. Meld score 12. Stable LFTs. Liver ultrasound did not show lesions concerning for hepatocellular carcinoma. Has followed with Dr. Jacqualine Mau of hepatology, he did not think her a candidate for treatment of hepatitis in the past 2 to multiple somatic complaints. Instructed to continue lactulose, propranolol.   Follow-up Appointments: Follow-up Information    Follow up with [facility] (PLEASE GO TO [facility] TOMORROW MORNING AND TALK TO THEM ABOUT YOUR HALLUCINATIONS)    Contact information:   [address] [name] [address] [phone]        Discharge Orders    Future Appointments: Provider: Department: Dept Phone: Center:   12/03/2012 2:15 PM Genelle Gather, MD [facility]  [phone] [facility]   01/22/2013 11:30 AM Erick Colace, MD [facility] [phone] None     Future Orders Please Complete By Expires   Diet - low sodium heart healthy      Increase activity slowly      Call MD for:  persistant dizziness or light-headedness      Call MD for:  difficulty breathing, headache or visual disturbances      Call MD for:  severe uncontrolled pain      Call MD for:  persistant nausea and vomiting         Consultations:    Procedures Performed:  Dg Neck Soft Tissue  10/30/2012  *RADIOLOGY REPORT*  Clinical Data: Allergic reaction.  Dysphagia.  Scratchy  voice.  NECK SOFT TISSUES - 1+ VIEW  Comparison: 07/13/2011   Findings: Soft tissue shadows are normal.  There is shadows are normal.  Chronic cervical spondylosis appears the same.  IMPRESSION: No radiographic acute finding.   Original Report Authenticated By: Paulina Fusi, M.D.    US Abdomen Complete  10/30/2012  *RADIOLOGY REPORT*  Clinical Data: Hepatitis C with cirrhosis.  ABDOMEN ULTRASOUND  Technique:  Complete abdominal ultrasound examination was performed including evaluation of the liver, gallbladder, bile ducts, pancreas, kidneys, spleen, IVC, and abdominal aorta.  Comparison: 10/07/2011  Findings:  Gallbladder:  Numerous stones noted within the gallbladder lumen. No gallbladder wall thickening or pericholecystic fluid.  The sonographer reports no sonographic Murphy's sign.  Common Bile Duct:  The upper normal diameter is 6 mm.  Liver:  Nodular peripheral contour is compatible with the reported clinical history of cirrhosis.  No intrahepatic biliary duct dilatation.  No intrahepatic mass lesion is evident.  IVC:  Visualized portions are unremarkable.  Pancreas:  Head and tail are not well seen secondary overlying bowel gas.  Spleen:  12 mm in cranial caudal length, upper normal.  Right kidney:  11.8 cm in long axis.  Normal.  Left kidney:  11.8 cm in long axis.  Normal.  Abdominal Aorta:  No aneurysm.  IMPRESSION: Nodular hepatic contour compatible with cirrhosis.  No focal intrahepatic parenchymal abnormality is evident.  MRI is a more sensitive means to investigate for hepatoma.  Cholelithiasis.   Original Report Authenticated By: Kennith Center, M.D.    Dg Chest Port 1 View  10/30/2012  *RADIOLOGY REPORT*  Clinical Data: Allergic reaction.  Dysphagia.  PORTABLE CHEST - 1 VIEW  Comparison: 07/15/2011  Findings: Artifact overlies the chest.  Heart size is normal. Mediastinal shadows are normal.  Mild chronic increased pulmonary markings appear the same.  No infiltrate, collapse or effusion.  No edema.  IMPRESSION: No change.  No active disease.  Slightly increased  chronic interstitial lung markings.   Original Report Authenticated By: Paulina Fusi, M.D.    Admission HPI:  [name] is a 52 year old female with a history of liver cirrhosis from chronic hep C and alcohol abuse, anxiety, visual hallucinations, who presents as a transfer from [place] [facility] ED with an elevated ammonia level.  Patient states that she came to emergency room because she got into an argument with her mother. She states that things have been quite stressful in that relationship over the past few weeks and has gotten worse over the past few days. She states she is "fed up" with the way her mother talks to her and calls her names. Patient denies any suicidal ideation, depressive thoughts, thoughts of hurting others. She regularly tells [place] that she is not having  visual or auditory hallucinations. She has been taking her medications as directed, denies any overdose attempts, denies recent substance abuse.  Patient was recently discharged from the [facility].  Review of systems positive only for a sore throat, which patient states that she has had for a long time. She says that she has an appointment tomorrow to get her throat evaluated by a specialist. She states that she is leaving this hospital morning to this appointment.  She denies having any pain anywhere. Does not have any chest pain, shortness of breath, fever, chills, nausea, vomiting.  Hospital Course by problem list:  1.) Visual Hallucinations Patient was found to be altered at ED in [facility]. Upon arrival to the hospital, patient's altered mental status resolved. Currently patient is oriented x3. Etiology was not clear. She had a CT scan of her head in September 2012 which showed no gross abnormalities of the brain. Differential diagnoses include psychotic issue. In her recent admission, patient had hallucination which was thought to be psychotic. Patient was encouraged to followup with [facility] for further evaluation and  possible antipsychotic therapy. It seems that the patient did not followup with [facility] yet. Patient had no evidence of hepatic encephalopathic. Her ammonia level was slightly elevated at 100 on this admission, but high ammonia level is not a diagnosis criteria for hepatic encephalopathy. She had no asterixis on exam, and she was completely oriented. Per ED physician at [facility], given this elevated ammonia level, and patient cannot be admitted to psychiatric service. However, the source of her AMS is likely psychiatric in nature. She was stable overnight with no acute changes. She was strongly urged to follow up with [facility] on discharge. No SI/HI.  2) Cirrhosis secondary to hepatitis C and alcohol abuse  Patient has genotype I hepatitis C and followed by Dr. Jacqualine Mau hepatology. Her liver enzymes at admission were only mildly elevated without much change from prior labs in June 2013 or from her last admission 1 week prior. Her ammonia elevation was only modest. She had normal renal function and INR, and only slightly elevated bilirubin. Her MELD score was 12. She had no asterixis on exam was completely oriented. She did not have any abdominal tenderness, fever, or leukocytosis concerning for spontaneous bacterial peritonitis. She did not have altered mental status consistent with hepatic encephalopathy.  She was last seen by her hepatologist in June of 2013. At that time, he thought she was a poor candidate for treatment of her hepatitis, given multiple somatic complaints. She has not had ascites or encephalopathy to treat in the past. She was last screened for varices in December 2012, showing grade 1 esophageal varices. e continued her propranolol for prophylaxis as well as lactulose. No changes to home meds on discharge.   3.) Hoarseness It is most likely due to recent upper respiratory infection. Currently patient does not have fever, chills, chest pain, shortness of breath or cough. Auscultation  is clear bilaterally. No need for image or treatment during admission.  4.) COPD  Stable, no signs of exacerbation. No PFT on file. Continued combivent inhaler and albuterol inhaler. Will need PFTs as outpatient.  5.) DVT PPx: Heparin  Discharge Vitals:  BP 112/73  Pulse 70  Temp 97.7 F (36.5 C) (Oral)  Resp 18  Ht 5\' 2"  (1.575 m)  Wt 160 lb 7.9 oz (72.8 kg)  BMI 29.35 kg/m2  SpO2 91%  Discharge Labs:  Results for orders placed during the hospital encounter of 11/07/12 (from  the past 24 hour(s))  AMMONIA     Status: Abnormal   Collection Time   11/07/12  7:00 PM      Component Value Range   Ammonia 100 (*) 11 - 60 umol/L  COMPREHENSIVE METABOLIC PANEL     Status: Abnormal   Collection Time   11/07/12  7:15 PM      Component Value Range   Sodium 138  135 - 145 mEq/L   Potassium 4.0  3.5 - 5.1 mEq/L   Chloride 104  96 - 112 mEq/L   CO2 26  19 - 32 mEq/L   Glucose, Bld 81  70 - 99 mg/dL   BUN 10  6 - 23 mg/dL   Creatinine, Ser 4.78  0.50 - 1.10 mg/dL   Calcium 8.9  8.4 - 29.5 mg/dL   Total Protein 7.1  6.0 - 8.3 g/dL   Albumin 3.2 (*) 3.5 - 5.2 g/dL   AST 85 (*) 0 - 37 U/L   ALT 49 (*) 0 - 35 U/L   Alkaline Phosphatase 146 (*) 39 - 117 U/L   Total Bilirubin 2.6 (*) 0.3 - 1.2 mg/dL   GFR calc non Af Amer >90  >90 mL/min   GFR calc Af Amer >90  >90 mL/min  CBC     Status: Abnormal   Collection Time   11/07/12  7:15 PM      Component Value Range   WBC 8.6  4.0 - 10.5 K/uL   RBC 4.69  3.87 - 5.11 MIL/uL   Hemoglobin 16.4 (*) 12.0 - 15.0 g/dL   HCT 62.1 (*) 30.8 - 65.7 %   MCV 101.7 (*) 78.0 - 100.0 fL   MCH 35.0 (*) 26.0 - 34.0 pg   MCHC 34.4  30.0 - 36.0 g/dL   RDW 84.6  96.2 - 95.2 %   Platelets 123 (*) 150 - 400 K/uL  ETHANOL     Status: Normal   Collection Time   11/07/12  7:15 PM      Component Value Range   Alcohol, Ethyl (B) <11  0 - 11 mg/dL  PROTIME-INR     Status: Abnormal   Collection Time   11/07/12  7:15 PM      Component Value Range   Prothrombin  Time 15.4 (*) 11.6 - 15.2 seconds   INR 1.24  0.00 - 1.49  URINE RAPID DRUG SCREEN (HOSP PERFORMED)     Status: Normal   Collection Time   11/07/12  8:46 PM      Component Value Range   Opiates NONE DETECTED  NONE DETECTED   Cocaine NONE DETECTED  NONE DETECTED   Benzodiazepines NONE DETECTED  NONE DETECTED   Amphetamines NONE DETECTED  NONE DETECTED   Tetrahydrocannabinol NONE DETECTED  NONE DETECTED   Barbiturates NONE DETECTED  NONE DETECTED  BASIC METABOLIC PANEL     Status: Abnormal   Collection Time   11/08/12  7:25 AM      Component Value Range   Sodium 138  135 - 145 mEq/L   Potassium 3.3 (*) 3.5 - 5.1 mEq/L   Chloride 105  96 - 112 mEq/L   CO2 22  19 - 32 mEq/L   Glucose, Bld 76  70 - 99 mg/dL   BUN 8  6 - 23 mg/dL   Creatinine, Ser 8.41 (*) 0.50 - 1.10 mg/dL   Calcium 8.8  8.4 - 32.4 mg/dL   GFR calc non Af Amer >90  >90 mL/min  GFR calc Af Amer >90  >90 mL/min    Signed: Bronson Curb 11/08/2012, 1:13 PM   Time Spent on Discharge: 35 min Services Ordered on Discharge: none Equipment Ordered on Discharge: none

## 2012-11-08 NOTE — H&P (Signed)
Internal Medicine Teaching Service Attending Note Date: 11/08/2012  Patient name: [name] [name]  Medical record number: [id number]  Date of birth: [birth year]    This patient has been seen and discussed with the house staff. Please see their note for complete details. I concur with their findings with the following additions/corrections: Patient is a 52 year old female with past medical history most significant for liver cirrhosis from chronic hepatitis C and ongoing alcohol abuse. Patient presented to [facility] ER because she had a fight with her mom and said that she just was fed up. Patient's family called the police who brought her to the hospital. Patient was found to have an elevated ammonia level but did not have any signs and symptoms suggestive of altered mental status or encephalopathy. Patient is dressed up and ready to go home at this time. Patient states that she is compliant with her medications and would continue to do so at home. On asking about the reason for coming to the ER patient could not really tell me. She will continue to take lactulose 30 g daily as needed for 2-3 bowel movements a day along with her medications for other chronic medical conditions.  Ammonia level in a patient with chronic liver cirrhosis does not correlate with the severity of liver cirrhosis or mental status changes and should not be considered a reason for medical admission as long as patient is mentating reasonably well.  I believe that patient is medically stable for discharge home at this time with outpatient followup with our clinic.   Lars Mage 11/08/2012, 10:17 AM

## 2012-11-08 NOTE — Clinical Social Work Note (Signed)
CSW consulted by RN re: patient need for transportation. This CSW provided bus pass for patient transport home. CSW signing off, no other psychosocial concerns identified. Please re-consult as needed.  Lia Foyer, LCSWA [facility] Clinical Social Worker Contact #: [phone] (Weekend)

## 2012-11-08 NOTE — Progress Notes (Signed)
Patient home meds were counted and verified by patient, inventory sheet signed by patient and meds carried to pharmacy by RN.  Inventory med list in front of patient's shadow chart.  Macarthur Critchley, RN

## 2012-11-08 NOTE — Progress Notes (Signed)
[name] [name] [id number] Admitted to 5500: 11/08/2012 3:32 AM Attending Provider: Lars Mage, MD    [name] [name] is a 52 y.o. female patient admitted from ED awake, alert  & orientated  X 3,  Prior, VSS - Blood pressure 149/114, pulse 73, temperature 97.9 F (36.6 C), temperature source Oral, resp. rate 20, height 5\' 2"  (1.575 m), weight 73.2 kg (161 lb 6 oz), SpO2 94.00%.RA, no c/o shortness of breath, no c/o chest pain, no distress noted.    IV site WDL:  with a transparent dsg that's clean dry and intact.  Allergies:  No Known Allergies   Past Medical History  Diagnosis Date  . Alcoholic cirrhosis   . COPD (chronic obstructive pulmonary disease)   . Heart palpitations   . cervical Cancer   . Anxiety   . Portal hypertension   . Esophageal varices   . Cholelithiasis   . Splenomegaly   . Fatty liver   . Esophagitis 2010  . Tubulovillous adenoma of colon   . Visual hallucination     since 09/2010/notes 10/30/2012  . Hepatitis C     chronic hepatitis C and Steatohepatitis (hep grade 2, stage 2-3) per 05/13/08 liver biopsy    History:  obtained from patient  Pt orientation to unit, room and routine. Information packet given to patient and safety video watched.  Admission INP armband ID verified with patient, and in place. SR up x 2, fall risk assessment complete with Patient verbalizing understanding of risks associated with falls. Pt verbalizes an understanding of how to use the call bell and to call for help before getting out of bed.  Skin, clean-dry- intact without evidence of bruising, or skin tears.   No evidence of skin break down noted on exam.  Will cont to monitor and assist as needed.  Elisha Ponder, RN 11/08/2012 3:32 AM

## 2012-11-08 NOTE — H&P (Signed)
Hospital Admission Note Date: 11/08/2012  Patient name: [name] [name] Medical record number: [id number] Date of birth: May 05, 1961 Age: 52 y.o. Gender: female PCP: Genelle Gather, MD  Attending: Dr. Eben Burow   Chief Complaint: "I had a fight with my mother"  History of Present Illness:  [name] is a 52 year old female with a history of liver cirrhosis from chronic hep C and alcohol abuse, anxiety, visual hallucinations, who presents as a transfer from [facility] ED with an elevated ammonia level.   Patient states that she came to emergency room because she got into an argument with her mother. She states that things have been quite stressful in that relationship over the past few weeks and has gotten worse over the past few days. She states she is "fed up" with the way her mother talks to her and calls her names. Patient denies any suicidal ideation, depressive thoughts, thoughts of hurting others. She regularly tells [place] that she is not having visual or auditory hallucinations. She has been taking her medications as directed, denies any overdose attempts, denies recent substance abuse.   Patient was recently discharged from the [facility].  Review of systems positive only for a sore throat, which patient states that she has had for a long time. She says that she has an appointment tomorrow to get her throat evaluated by a specialist. She states that she is leaving this hospital morning to this appointment.  She denies having any pain anywhere. Does not have any chest pain, shortness of breath, fever, chills, nausea, vomiting.   Meds: No current outpatient prescriptions on file.  Allergies: Allergies as of 11/07/2012  . (No Known Allergies)   Past Medical History  Diagnosis Date  . Alcoholic cirrhosis   . COPD (chronic obstructive pulmonary disease)   . Heart palpitations   . cervical Cancer   . Anxiety   . Portal hypertension   . Esophageal varices   . Cholelithiasis   .  Splenomegaly   . Fatty liver   . Esophagitis 2010  . Tubulovillous adenoma of colon   . Visual hallucination     since 09/2010/notes 10/30/2012  . Hepatitis C     chronic hepatitis C and Steatohepatitis (hep grade 2, stage 2-3) per 05/13/08 liver biopsy   Past Surgical History  Procedure Date  . Cervical cancer surgery   . Abdominal hysterectomy   . Tubal ligation   . Esophagogastroduodenoscopy 10/18/2011    Procedure: ESOPHAGOGASTRODUODENOSCOPY (EGD);  Surgeon: Rob Bunting, MD;  Location: [facility];  Service: Endoscopy;  Laterality: N/A;   Family History  Problem Relation Age of Onset  . Cervical cancer Sister   . Breast cancer Maternal Aunt   . Heart disease Father 16    died from MI  . Cervical cancer Daughter    History   Social History  . Marital Status: Married    Spouse Name: N/A    Number of Children: N/A  . Years of Education: N/A   Occupational History  . Not on file.   Social History Main Topics  . Smoking status: Former Smoker -- 0.1 packs/day for 40 years    Types: Cigarettes    Quit date: 10/16/2011  . Smokeless tobacco: Never Used     Comment: smoked about a week ago when she got upset-brother still smokes  . Alcohol Use: No     Comment: months since last drink  . Drug Use: No  . Sexually Active: Not Currently    Birth Control/ Protection:  Condom   Other Topics Concern  . Not on file   Social History Narrative  . No narrative on file    Review of Systems: Review of systems negative except as otherwise noted in history of present illness  Physical Exam Blood pressure 149/114, pulse 73, temperature 97.9 F (36.6 C), temperature source Oral, resp. rate 20, height 5\' 2"  (1.575 m), weight 161 lb 6 oz (73.2 kg), SpO2 94.00%. General:  Patient resting in bed comfortably, she is alert and cooperative with exam HEENT:  PERRL, EOMI, oropharynx clear, mucous membranes dry Cardiovascular:  Regular rate and rhythm, no murmurs Respiratory:  Clear to  auscultation bilaterally, no wheezes, rales, or rhonchi, some transmitted upper airway noises throughout Abdomen:  Soft, nondistended, nontender, bowel sounds present Extremities:  Warm and well-perfused, no edema.  Skin: Warm, dry, no rashes Neuro: Oriented to person, place, states the year is "1914" but later corrects it. Cranial nerves intact. No asterixis. Patient appears to be perseverating on this fight with her mother. Most questions asked of the patient return to this topic.  Psych: Patient does not appear to be actively psychotic, she is not responding to auditory or visual hallucinations. She appears quite calm.   Lab results: Basic Metabolic Panel:  [facility] 11/07/12 1915  NA 138  K 4.0  CL 104  CO2 26  GLUCOSE 81  BUN 10  CREATININE 0.57  CALCIUM 8.9  MG --  PHOS --   Liver Function Tests:  [facility] 11/07/12 1915  AST 85*  ALT 49*  ALKPHOS 146*  BILITOT 2.6*  PROT 7.1  ALBUMIN 3.2*   No results found for this basename: LIPASE:2,AMYLASE:2 in the last 72 hours  Basename 11/07/12 1900  AMMONIA 100*   CBC:  Basename 11/07/12 1915  WBC 8.6  NEUTROABS --  HGB 16.4*  HCT 47.7*  MCV 101.7*  PLT 123*   Coagulation:  Basename 11/07/12 1915  LABPROT 15.4*  INR 1.24   Urine Drug Screen: Drugs of Abuse     Component Value Date/Time   LABOPIA NONE DETECTED 11/07/2012 2046   LABOPIA NEG 07/22/2012 1527   COCAINSCRNUR NONE DETECTED 11/07/2012 2046   COCAINSCRNUR NEG 07/22/2012 1527   LABBENZ NONE DETECTED 11/07/2012 2046   LABBENZ PPS 07/22/2012 1527   LABBENZ NEG 05/14/2010 2116   AMPHETMU NONE DETECTED 11/07/2012 2046   AMPHETMU NEG 05/14/2010 2116   THCU NONE DETECTED 11/07/2012 2046   LABBARB NONE DETECTED 11/07/2012 2046   LABBARB NEG 07/22/2012 1527    Alcohol Level:  Basename 11/07/12 1915  ETH <11    Imaging results:  No results found.   Assessment & Plan by Problem:  1.) AMS: Patient was found to be altered at ED in [facility]. Upon  arrival to the hospital, patient's altered mental status resolved. Currently patient is oriented x3. Etiology was not clear. She had a CT scan of her head in September 2012 which showed no gross abnormalities of the brain. Differential diagnoses include psychotic issue. In her recent admission, patient had hallucination which was thought to be psychotic. Patient was encouraged to followup with [facility] for further evaluation and possible antipsychotic therapy. It seems that the patient did not followup with [facility] clinic yet. Another possibility is hepatic encephalopathy, though unlikely. Patient has history of cirrhosis secondary to hepatitis C and alcohol abuse. The ammonia level is 100 on this admission, but high ammonia level is not a diagnosis criteria for hepatic encephalopathy. She has no asterixis on exam and she  is completely oriented.  Per ED physician at [facility], given his elevated ammonia level, and patient cannot be admitted to psychiatric service.  Plan: - will admit to Med-surg bed  - Lactulose, 30 g daily - let patient follow up in [place] after discharge - AM Lab: BMP  2.) Cirrhosis secondary to hepatitis C and alcohol abuse  Patient has type I hepatitis C and followed by Dr. Jacqualine Mau hepatology. Her liver enzymes today are only mildly elevated without much change from prior labs in 10/30/12. She has normal INR. Her ammonia elevation is only modest, but patient does not have signs of spontaneous bacterial peritonitis and hepatic encephalopathy. She was last screened for varices in December 2012, showing grade 1 esophageal varices. Previous abdominal U/S on 10/30/12 showed no lesions in liver concerning for HCC  - continue  propranolol for prophylaxis of esophageal varix bleed  - continue lactulose as above  3.) Hoarseness: It is most likely due to recent upper respiratory infection. Currently patient does not have fever, chills, chest pain, shortness of breath or cough. Auscultation is clear  bilaterally. No need for image or specific  treatment now, will monitor closely.  4.) COPD  Stable, no signs of exacerbation. No PFT on file.  - Continue combivent inhaler  and albuterol inhaler when necessary - PFT as outpatient.  5.) DVT PPx: Heparin   Dispo: Disposition is deferred at this time, awaiting improvement of current medical problems. Anticipated discharge in approximately 1 day(s).   The patient does have a current PCP Sherrine Maples, Aurelio Brash, MD), therefore will be requiring OPC follow-up after discharge.    Signed: Denton Ar 11/08/2012, 3:33 AM

## 2012-11-10 ENCOUNTER — Encounter: Payer: Self-pay | Admitting: *Deleted

## 2012-11-10 NOTE — Progress Notes (Signed)
[name] comes in to ask if there is something that can be done to help pt, she states pt has left her home and gone back to pt's apartment, she is not taking her medications correctly and having hallucinations, she states she has called the [facility] and pt has called stating there were people under her bed, [employer] has told pt they will have her committed if the behavior does not stop, pt's mother states when she takes her meds correctly she does much better but pt will not do so. She is very worried about pt's safety and ask if there is something that can be done, sending this note to shanag. Social worker and will have her call pt's mother. [name] [phone]

## 2012-11-10 NOTE — Progress Notes (Signed)
[name], female   DOB: 02/09/1961, 52 y.o.   MRN: [id number]  [name] [name], are there any mental health resources available to help [name] take her medications or any other psychiatric resources that we can utilize?   [name] was recently admitted for hallucinations. This appeears to be an ongoing issue. If she continues to hallucinate, her family should get her to the ED for psychiatric assessment and possible involuntary commitment.

## 2012-11-11 ENCOUNTER — Other Ambulatory Visit: Payer: Self-pay | Admitting: Internal Medicine

## 2012-11-11 ENCOUNTER — Telehealth: Payer: Self-pay | Admitting: Licensed Clinical Social Worker

## 2012-11-11 NOTE — Telephone Encounter (Signed)
[name] came to clinic on 1/14 to voice concern for her daughter.  CSW placed call to pt's mother, [name] [name].  Mother states pt was recently d/c to her house from the hospital.  Mother attempted to help pt take medications appropriately, however according to mother pt did not.  Pt currently living at her own apt, leaving mother's house.  CSW informed [name] [name] if concern is that [name] [name] is unable to make appropriate decision then mother/family should seek out applying for guardianship.  CSW explained as long as pt is not a danger to herself or others, pt has the right to smoke cigarettes, drink alcohol and not take her medications.  However, if family feels [name] [name] is unable to make appropriate decisions based on her incompetence then family should seek guardianship.  CSW provided [name] [name] with phone number to [facility] Guardianship [phone].   CSW placed call to [name] [name].  Pt states she has been to [facility] twice and is aware they are available.  CSW discussed option of seeing a [facility] physician if pt was not satisfied with [facility].  Pt states she would like appt with [name].  CSW made [name] [name] aware of [facility] care management services.  Pt agreeable to referral and confirmed address and phone number.  CSW informed [name] [name] of appt scheduled for 12/03/12 with Dr. Sherrine Maples.  [name] [name] reports she is not feeling well at this time due to an upset stomach.  [name] [name] could not name the medication.  Pt states PCP is aware of a particular medication that makes her stomach hurt. CSW offered to see if triage to schedule an appt due to her stomach pain.  Pt declines.  CSW will send [name] [name] brochure on [facility] transportation and return envelope. CSW will schedule with [facility] after [name] [name] returns [employer] application, as CSW does not want lack of transportation to be a reason for a no-show.   Until [facility] is scheduled, pt aware to continue using [facility]'s services.

## 2012-11-11 NOTE — Telephone Encounter (Signed)
Given her recent hallucinations, and that at her recent hospitalization, she was told to stop taking the Ativan, I do not want [name] [name] to continue these medications. She needs to be seen at [facility]. However, if she is still having hallucinations, she needs to be seen in the ED for psychiatric evaluation, as stated in my note yesterday.

## 2012-11-12 ENCOUNTER — Telehealth: Payer: Self-pay | Admitting: *Deleted

## 2012-11-12 MED ORDER — LORAZEPAM 2 MG PO TABS: 2.0000 mg | ORAL_TABLET | Freq: Two times a day (BID) | ORAL | Status: DC | PRN

## 2012-11-12 MED ORDER — TRAMADOL HCL 50 MG PO TABS: 50.0000 mg | ORAL_TABLET | Freq: Three times a day (TID) | ORAL | Status: DC | PRN

## 2012-11-12 NOTE — Telephone Encounter (Signed)
Pt called asking for a refill on on ativan 2 mg q 8 hours and ultram.  She has been on ativan for several years. Refill was called in on 1/15 and refused by Dr Sherrine Maples.  Ativan was to be stopped but has still taken and only has 2 days left.  She is afraid to completely stop.  Pt states she has gone to [facility] and they did not mention change in meds. Pt states hallucinations stopped when she stopped amitriptyline.  Pt # [id number] Please advise

## 2012-11-12 NOTE — Telephone Encounter (Signed)
Rx called in to [facility]

## 2012-11-12 NOTE — Telephone Encounter (Signed)
I called and spoke with [name].  She reports that she did not realize on being discharged that she was supposed to stop taking her ativan.  She is taking it on a scheduled basis at home, three time a day, despite PRN being listed on the prescription. It appears that the intention of the MD's taking care of her in the hospital, and her PCP Dr. Sherrine Maples was that she should be tapered off this medication and go to see a Psychiatry provider to assist with her psychiatric issues including hallucinations (in the hospital twice for this). There is concern for a psychotic illness vs. Medication effect.   As BZD's should be tapered, and she has continued to take this medication at home, will give a 1 time prescription for ativan 2mg  BID PRN for anxiety which will begin a weaning process for her.  I advised her strongly to keep her planned appointment with [facility] health, which it appears CSW is helping to arrange.    I also advised her that she should only take the medication at most twice a day or she would be in danger of running out of the medicine.    I will forward this note to her PCP Dr. Sherrine Maples.    Signed   Criselda Peaches, [name]

## 2012-11-13 NOTE — Telephone Encounter (Signed)
No answer

## 2012-12-02 ENCOUNTER — Other Ambulatory Visit: Payer: Self-pay | Admitting: Internal Medicine

## 2012-12-02 ENCOUNTER — Telehealth: Payer: Self-pay | Admitting: *Deleted

## 2012-12-02 MED ORDER — IPRATROPIUM-ALBUTEROL 20-100 MCG/ACT IN AERS: 1.0000 | INHALATION_SPRAY | Freq: Four times a day (QID) | RESPIRATORY_TRACT | Status: DC | PRN

## 2012-12-02 NOTE — Telephone Encounter (Signed)
[facility] informed of Combivent Respimat rx per Dr Eben Burow.

## 2012-12-02 NOTE — Telephone Encounter (Signed)
Fax from [facility] - Combivent is no longer available for free; needs to be changed to Combivent Respimat. New new rx. Thanks

## 2012-12-02 NOTE — Telephone Encounter (Signed)
Please call in the new prescription. Thank you [name] [name]

## 2012-12-03 ENCOUNTER — Ambulatory Visit (INDEPENDENT_AMBULATORY_CARE_PROVIDER_SITE_OTHER): Payer: No Typology Code available for payment source | Admitting: Internal Medicine

## 2012-12-03 ENCOUNTER — Encounter: Payer: Self-pay | Admitting: Internal Medicine

## 2012-12-03 VITALS — BP 132/88 | HR 74 | Temp 97.0°F | Ht 62.5 in | Wt 171.9 lb

## 2012-12-03 DIAGNOSIS — B182 Chronic viral hepatitis C: Secondary | ICD-10-CM

## 2012-12-03 DIAGNOSIS — Z0000 Encounter for general adult medical examination without abnormal findings: Secondary | ICD-10-CM

## 2012-12-03 DIAGNOSIS — H00019 Hordeolum externum unspecified eye, unspecified eyelid: Secondary | ICD-10-CM

## 2012-12-03 DIAGNOSIS — B1920 Unspecified viral hepatitis C without hepatic coma: Secondary | ICD-10-CM

## 2012-12-03 DIAGNOSIS — F419 Anxiety disorder, unspecified: Secondary | ICD-10-CM

## 2012-12-03 DIAGNOSIS — F411 Generalized anxiety disorder: Secondary | ICD-10-CM

## 2012-12-03 DIAGNOSIS — G8929 Other chronic pain: Secondary | ICD-10-CM

## 2012-12-03 MED ORDER — TRAMADOL HCL 50 MG PO TABS: 50.0000 mg | ORAL_TABLET | Freq: Three times a day (TID) | ORAL | Status: DC | PRN

## 2012-12-03 NOTE — Progress Notes (Signed)
[name], female   DOB: 09-26-1961, 51 y.o.   MRN: [id number]  Subjective:   [name] female   DOB: [birth year] 52 y.o.   MRN: [id number]  HPI: [name] is a 52 y.o. female with a history of liver cirrhosis from chronic hep C and alcohol abuse, anxiety, and visual hallucinations, presents for a hospital follow up.   She states that since her hospital discharge, she is feeling much better. She was told to stop her Ativan at hospital discharge. She denies any further hallucinations, but states that she is still having significant problems with her "nerves" and is still taking the Ativan.  She states that the Mobic and Tramadol are helping to control her pain. She does have an appointment with Dr, [name] [name], the pain management specialist on 01/22/13.   She also has a sty on her left eyelid that has been present for 1-2 days. She is trying warm compresses.    Past Medical History  Diagnosis Date  . Alcoholic cirrhosis   . COPD (chronic obstructive pulmonary disease)   . Heart palpitations   . cervical Cancer   . Anxiety   . Portal hypertension   . Esophageal varices   . Cholelithiasis   . Splenomegaly   . Fatty liver   . Esophagitis 2010  . Tubulovillous adenoma of colon   . Visual hallucination     since 09/2010/notes 10/30/2012  . Hepatitis C     chronic hepatitis C and Steatohepatitis (hep grade 2, stage 2-3) per 05/13/08 liver biopsy   Current Outpatient Prescriptions  Medication Sig Dispense Refill  . albuterol (PROVENTIL HFA;VENTOLIN HFA) 108 (90 BASE) MCG/ACT inhaler Inhale 2 puffs into the lungs every 6 (six) hours as needed for wheezing.  1 Inhaler  4  . Ipratropium-Albuterol (COMBIVENT) 20-100 MCG/ACT AERS respimat Inhale 1 puff into the lungs every 6 (six) hours as needed for wheezing.  1 Inhaler  11  . lactulose (CHRONULAC) 10 GM/15ML solution Take 45 mLs (30 g total) by mouth daily.  240 mL  2  . LORazepam (ATIVAN) 2 MG tablet Take 1  tablet (2 mg total) by mouth 2 (two) times daily as needed for anxiety.  60 tablet  0  . meloxicam (MOBIC) 15 MG tablet Take 1 tablet (15 mg total) by mouth every 12 (twelve) hours.  30 tablet  2  . pantoprazole (PROTONIX) 40 MG tablet Take 1 tablet (40 mg total) by mouth daily.  30 tablet  11  . potassium chloride SA (K-DUR,KLOR-CON) 20 MEQ tablet Take 1 tablet (20 mEq total) by mouth daily.  30 tablet  2  . propranolol (INDERAL) 20 MG tablet Take 1 tablet (20 mg total) by mouth daily.  60 tablet  11  . traMADol (ULTRAM) 50 MG tablet Take 1 tablet (50 mg total) by mouth every 8 (eight) hours as needed. For pain.  60 tablet  0   Family History  Problem Relation Age of Onset  . Cervical cancer Sister   . Breast cancer Maternal Aunt   . Heart disease Father 46    died from MI  . Cervical cancer Daughter    History   Social History  . Marital Status: Married    Spouse Name: N/A    Number of Children: N/A  . Years of Education: N/A   Social History Main Topics  . Smoking status: Former Smoker -- 0.1 packs/day for 40 years    Types: Cigarettes    Quit  date: 10/16/2011  . Smokeless tobacco: Never Used     Comment: smoked about a week ago when she got upset-brother still smokes  . Alcohol Use: No     Comment: months since last drink  . Drug Use: No  . Sexually Active: Not Currently    Birth Control/ Protection: Condom   Other Topics Concern  . None   Social History Narrative  . None   Review of Systems: Constitutional: Denies fever, chills, diaphoresis, appetite change and fatigue.  HEENT: Denies photophobia, eye pain, redness, hearing loss, ear pain, congestion, sore throat, rhinorrhea, sneezing, mouth sores, trouble swallowing, neck pain, neck stiffness and tinnitus.   Respiratory: Denies SOB, DOE, cough, chest tightness,  and wheezing.   Cardiovascular: Denies chest pain, palpitations and leg swelling.  Gastrointestinal: Denies nausea, vomiting, abdominal pain, diarrhea,  constipation, blood in stool and abdominal distention.  Genitourinary: Denies dysuria, urgency, frequency, hematuria, flank pain and difficulty urinating.  Musculoskeletal: + back and leg pain.  Skin: Denies pallor, rash and wound.  Neurological: Denies dizziness, seizures, syncope, weakness, light-headedness, numbness and headaches.  Hematological: Denies adenopathy. Easy bruising, personal or family bleeding history  Psychiatric/Behavioral: Denies any further hallucinations  Objective:  Physical Exam: Filed Vitals:   12/03/12 1454  BP: 132/88  Pulse: 74  Temp: 97 F (36.1 C)  TempSrc: Oral  Height: 5' 2.5" (1.588 m)  Weight: 171 lb 14.4 oz (77.973 kg)  SpO2: 94%   Constitutional: Vital signs reviewed.  Patient is a well-developed and well-nourished female in no acute distress and cooperative with exam.   Head: Normocephalic and atraumatic Mouth: Poor dentition Eyes: PERRL, EOMI, conjunctivae normal, No scleral icterus.  Cardiovascular: RRR Pulmonary/Chest: CTAB, no wheezes, rales, or rhonchi Abdominal: Soft. Non-tender, non-distended Musculoskeletal: Lumbosacral spinal pain Neurological: A&O x3, non-focal Psychiatric: Normal mood and affect. Speech is still slowed but greatly improved from her last visit. Less anxious appearing today.  Assessment & Plan:    Please refer to Problem List based Assessment and Plan

## 2012-12-03 NOTE — Patient Instructions (Addendum)
**  You need to follow up at [facility] for your anxiety/ "nerves", they can help you, but you need to give them a chance**  **Stop taking the Ativan and Amitriptyline.  **Continue taking the Mobic. I will give you a one month prescription for the Tramadol. You have an appointment with the Pain Management specialist, Dr. Wynn Banker on 01/22/13. - please see letter provided.  **I am referring you to the Hepatitis clinic that is located in [facility]. If you do not hear about an appointment in the next 2 weeks, please call the clinic.**  ** For the sty on your left eyelid, continue the warm compresses during the day and especially at night before bed.**    Sty A sty (hordeolum) is an infection of a gland in the eyelid located at the base of the eyelash. A sty may develop a white or yellow head of pus. It can be puffy (swollen). Usually, the sty will burst and pus will come out on its own. They do not leave lumps in the eyelid once they drain. A sty is often confused with another form of cyst of the eyelid called a chalazion. Chalazions occur within the eyelid and not on the edge where the bases of the eyelashes are. They often are red, sore and then form firm lumps in the eyelid. CAUSES    Germs (bacteria).   Lasting (chronic) eyelid inflammation.  SYMPTOMS    Tenderness, redness and swelling along the edge of the eyelid at the base of the eyelashes.   Sometimes, there is a white or yellow head of pus. It may or may not drain.  DIAGNOSIS   An ophthalmologist will be able to distinguish between a sty and a chalazion and treat the condition appropriately.   TREATMENT    Styes are typically treated with warm packs (compresses) until drainage occurs.   In rare cases, medicines that kill germs (antibiotics) may be prescribed. These antibiotics may be in the form of drops, cream or pills.   If a hard lump has formed, it is generally necessary to do a small incision and remove the hardened contents  of the cyst in a minor surgical procedure done in the office.   In suspicious cases, your caregiver may send the contents of the cyst to the lab to be certain that it is not a rare, but dangerous form of cancer of the glands of the eyelid.  HOME CARE INSTRUCTIONS    Wash your hands often and dry them with a clean towel. Avoid touching your eyelid. This may spread the infection to other parts of the eye.   Apply heat to your eyelid for 10 to 20 minutes, several times a day, to ease pain and help to heal it faster.   Do not squeeze the sty. Allow it to drain on its own. Wash your eyelid carefully 3 to 4 times per day to remove any pus.  SEEK IMMEDIATE MEDICAL CARE IF:    Your eye becomes painful or puffy (swollen).   Your vision changes.   Your sty does not drain by itself within 3 days.   Your sty comes back within a short period of time, even with treatment.   You have redness (inflammation) around the eye.   You have a fever.  Document Released: 07/24/2005 Document Revised: 01/06/2012 Document Reviewed: 03/28/2009 [facility] Patient Information 2013 [place], [place].

## 2012-12-03 NOTE — Addendum Note (Signed)
Addended by: Bufford Spikes on: 12/03/2012 10:06 AM   Modules accepted: Orders

## 2012-12-07 DIAGNOSIS — H00019 Hordeolum externum unspecified eye, unspecified eyelid: Secondary | ICD-10-CM | POA: Insufficient documentation

## 2012-12-07 NOTE — Assessment & Plan Note (Signed)
She was formerly seen at the Hepatitis clinic in town that was afiliated with [facility]. After it's closure, she was referred to the new clinic affiliated with [facility] center. I am not sure it this clinic ever opened, but now there is a Hepatitis clinic in [facility] affiliated with [facility]. I have referred [name] [name] to this clinic, so hopefully she can be seen by them soon. She states that transportation to [employer] will not be a problem.

## 2012-12-07 NOTE — Assessment & Plan Note (Signed)
Given her recent hospitalization for hallucinations, I have told [name] [name] I will not prescribe the Ativan for her any longer. She has been seen at [facility] and has been told on multiple clinic visits that the folks at [place], the mental health specialist, will be the ones to provide treatment for her anxiety.  I have tried Celexa for [name] [name], but she did not like taking the medication (she cannot give me a reason why). At this time, she is not interested in trying any other medications except benzos, asking about Xanax and Klonopin instead of the Ativan. I am deferring her anxiety treatment to [facility], in the hopes that they can improve her symptoms and get her off of the benzos. [name] [name] is aware of this and acknowledges understanding that she needs to follow up in the next few days with [facility]. I gave her another handout for [facility] at this visit and highlighted the phone number and address for Mr. Lin [name].

## 2012-12-07 NOTE — Assessment & Plan Note (Signed)
She has been accepted at Pain Management with Dr. Wynn Banker. Her appointment is 3/38/14. [name] states that her pain is better controlled on the tramadol and Mobic. Given her recent hospitalization 2/2 hallucinations, I do not want to continue the Tramadol, but it does help her pain. She seemed much clearer this clinic visit than her last (b/f her hospitalization). I told her I would give her 1 month of tramadol but no more. The remainder of her therapy for her pain will come from Dr. Wynn Banker. She acknowledged understanding.

## 2012-12-08 ENCOUNTER — Telehealth: Payer: Self-pay | Admitting: *Deleted

## 2012-12-08 NOTE — Telephone Encounter (Addendum)
Call from [facility] stating they have ordered Combivent respimat for pt but it will not be in for a few weeks.  They wanted to dispense Combivent until the order arrives.   Approved by Dr Kem Kays

## 2012-12-15 ENCOUNTER — Other Ambulatory Visit: Payer: Self-pay | Admitting: *Deleted

## 2012-12-15 DIAGNOSIS — F419 Anxiety disorder, unspecified: Secondary | ICD-10-CM

## 2012-12-16 ENCOUNTER — Telehealth: Payer: Self-pay | Admitting: *Deleted

## 2012-12-16 MED ORDER — LORAZEPAM 1 MG PO TABS: 1.0000 mg | ORAL_TABLET | Freq: Two times a day (BID) | ORAL | Status: DC | PRN

## 2012-12-16 MED ORDER — LORAZEPAM 2 MG PO TABS: 1.0000 mg | ORAL_TABLET | Freq: Two times a day (BID) | ORAL | Status: DC | PRN

## 2012-12-16 NOTE — Telephone Encounter (Signed)
Pt called asking for refill of medication.  I read the last note and told pt [facility] would be managing her anxiety meds. She still has questions about that. Also c/o rt foot pain and swelling.  She said this is not new, this was present at last visit.   Offered appointment for this afternoon but pt unable to come in. Given appointment for tomorrow AM

## 2012-12-16 NOTE — Telephone Encounter (Signed)
Despite multiple conversations with [name] regarding her "nerves" and her Ativan usage, she still does not seem to understand that she needs to be treated by the physicians at [facility] for her "nerves"/anxiety. I have told her many times that I no longer feel comfortable prescribing Ativan for her, especially given her recent hospitalization for hallucinations. I do not believe the usage of this medication is in her best interest. Dr. Criselda Peaches spoke with the patient on the phone in January and also told her the same things, and she also told [name] that she was to taper her Ativan usage down. This has not been done by the patient. As a result, I will refill the Ativan but this time at a lower dose, 1mg  BID, down from 2mg  BID; this way we are not stopping cold [place]. I will call her and further encourage her to continue to go to [facility] for treatment, and I will re-explain the importance of stopping the Ativan and seeking treatment at [facility]. Unfortunately, at this time she does not appear to hear what I am saying or possibly have any desire to stop the Ativan. If this is the case, her Ativan will continue to be tapered down until she is off the medication.

## 2012-12-16 NOTE — Telephone Encounter (Signed)
Ativan 1 mg [facility]

## 2012-12-17 ENCOUNTER — Encounter: Payer: Self-pay | Admitting: Internal Medicine

## 2012-12-17 ENCOUNTER — Ambulatory Visit (INDEPENDENT_AMBULATORY_CARE_PROVIDER_SITE_OTHER): Payer: No Typology Code available for payment source | Admitting: Internal Medicine

## 2012-12-17 VITALS — BP 112/79 | HR 82 | Temp 97.9°F | Ht 62.5 in | Wt 166.9 lb

## 2012-12-17 DIAGNOSIS — R441 Visual hallucinations: Secondary | ICD-10-CM

## 2012-12-17 DIAGNOSIS — B372 Candidiasis of skin and nail: Secondary | ICD-10-CM

## 2012-12-17 DIAGNOSIS — F419 Anxiety disorder, unspecified: Secondary | ICD-10-CM

## 2012-12-17 DIAGNOSIS — F411 Generalized anxiety disorder: Secondary | ICD-10-CM

## 2012-12-17 DIAGNOSIS — H5316 Psychophysical visual disturbances: Secondary | ICD-10-CM

## 2012-12-17 MED ORDER — FLUCONAZOLE 150 MG PO TABS: ORAL_TABLET | ORAL | Status: DC

## 2012-12-17 MED ORDER — NYSTATIN 100000 UNIT/GM EX POWD: CUTANEOUS | Status: DC

## 2012-12-17 NOTE — Patient Instructions (Addendum)
General Instructions: Please schedule a follow up appointment in 1 months.. Please bring your medication bottles with your next appointment. Please take your medicines as prescribed. Please keep up with your [facility] appointment. Please use nystatin powder for your fungal rash and keep the area dry.     Treatment Goals:  Goals (1 Years of Data) as of 12/17/12   None      Progress Toward Treatment Goals:  Treatment Goal 12/17/2012  Other  deteriorated    Self Care Goals & Plans:  Self Care Goal 12/17/2012  Manage my medications take my medicines as prescribed; bring my medications to every visit; refill my medications on time; follow the sick day instructions if I am sick       Care Management & Community Referrals:  Referral 12/17/2012  Referrals made for care management support none needed

## 2012-12-17 NOTE — Progress Notes (Signed)
Subjective:   [name] female   DOB: 12/17/1960 52 y.o.   MRN: [id number]  HPI: 52 year old woman with past medical history significant for chronic hepatitis C with cirrhosis, visual hallucinations presents to the clinic for a followup visit.  Patient still reports that occasionally -she would see people who are not there. She has now started to live up with her mother who takes care of her. She has not been able to see any psychiatrist yet but has an appointment coming up with [facility] on 6 March. She called the clinic yesterday for refill on her Ativan,  half of the regular dose was called in for her to help with her anxiety. Still had bottle of amitriptyline with her, claims that she has not been taking any of the pills.   She reports new onset rash in the intertriginous area of her groin, associated with redness and itching. She also reports some vaginal itching    Past Medical History  Diagnosis Date  . Alcoholic cirrhosis   . COPD (chronic obstructive pulmonary disease)   . Heart palpitations   . cervical Cancer   . Anxiety   . Portal hypertension   . Esophageal varices   . Cholelithiasis   . Splenomegaly   . Fatty liver   . Esophagitis 2010  . Tubulovillous adenoma of colon   . Visual hallucination     since 09/2010/notes 10/30/2012  . Hepatitis C     chronic hepatitis C and Steatohepatitis (hep grade 2, stage 2-3) per 05/13/08 liver biopsy   Family History  Problem Relation Age of Onset  . Cervical cancer Sister   . Breast cancer Maternal Aunt   . Heart disease Father 23    died from MI  . Cervical cancer Daughter    History   Social History  . Marital Status: Married    Spouse Name: N/A    Number of Children: N/A  . Years of Education: N/A   Occupational History  . Not on file.   Social History Main Topics  . Smoking status: Former Smoker -- 0.10 packs/day for 40 years    Types: Cigarettes    Quit date: 10/16/2011  . Smokeless tobacco: Never  Used     Comment: smoked about a week ago when she got upset-brother still smokes  . Alcohol Use: No     Comment: months since last drink  . Drug Use: No  . Sexually Active: Not Currently    Birth Control/ Protection: Condom   Other Topics Concern  . Not on file   Social History Narrative  . No narrative on file   Review of Systems: General: Denies fever, chills, diaphoresis, appetite change and fatigue. HEENT: Denies photophobia, eye pain, redness, hearing loss, ear pain, congestion, sore throat, rhinorrhea, sneezing, mouth sores, trouble swallowing, neck pain, neck stiffness and tinnitus. Respiratory: Denies SOB, DOE, cough, chest tightness, and wheezing. Cardiovascular: Denies to chest pain, palpitations and leg swelling. Gastrointestinal: Denies nausea, vomiting, abdominal pain, diarrhea, constipation, blood in stool and abdominal distention. Genitourinary: Denies dysuria, urgency, frequency, hematuria, flank pain and difficulty urinating. Musculoskeletal: Denies myalgias, back pain, joint swelling, arthralgias and gait problem.  Skin: Denies pallor, and wound,  + rash Neurological: Denies dizziness, seizures, syncope, weakness, light-headedness, numbness and headaches. Hematological: Denies adenopathy, easy bruising, personal or family bleeding history. Psychiatric/Behavioral: Denies suicidal ideation, mood changes, confusion, nervousness, sleep disturbance and agitation.    Current Outpatient Medications: Current Outpatient Prescriptions  Medication Sig Dispense Refill  .  albuterol (PROVENTIL HFA;VENTOLIN HFA) 108 (90 BASE) MCG/ACT inhaler Inhale 2 puffs into the lungs every 6 (six) hours as needed for wheezing.  1 Inhaler  4  . Ipratropium-Albuterol (COMBIVENT) 20-100 MCG/ACT AERS respimat Inhale 1 puff into the lungs every 6 (six) hours as needed for wheezing.  1 Inhaler  11  . lactulose (CHRONULAC) 10 GM/15ML solution Take 45 mLs (30 g total) by mouth daily.  240 mL  2  .  LORazepam (ATIVAN) 1 MG tablet Take 1 tablet (1 mg total) by mouth 2 (two) times daily as needed for anxiety.  60 tablet  0  . meloxicam (MOBIC) 15 MG tablet Take 1 tablet (15 mg total) by mouth every 12 (twelve) hours.  30 tablet  2  . pantoprazole (PROTONIX) 40 MG tablet Take 1 tablet (40 mg total) by mouth daily.  30 tablet  11  . potassium chloride SA (K-DUR,KLOR-CON) 20 MEQ tablet Take 1 tablet (20 mEq total) by mouth daily.  30 tablet  2  . propranolol (INDERAL) 20 MG tablet Take 1 tablet (20 mg total) by mouth daily.  60 tablet  11  . traMADol (ULTRAM) 50 MG tablet Take 1 tablet (50 mg total) by mouth every 8 (eight) hours as needed. For pain.  60 tablet  0   No current facility-administered medications for this visit.    Allergies: No Known Allergies    Objective:   Physical Exam: Filed Vitals:   12/17/12 1054  BP: 112/79  Pulse: 82  Temp: 97.9 F (36.6 C)    General: Vital signs reviewed and noted. Well-developed, well-nourished, in no acute distress; alert, appropriate and cooperative throughout examination. Head: Normocephalic, atraumatic Lungs: Normal respiratory effort. Clear to auscultation BL without crackles or wheezes. Heart: RRR. S1 and S2 normal without gallop, murmur, or rubs. Abdomen:BS normoactive. Soft, Nondistended, non-tender.  No masses or organomegaly. Extremities: No pretibial edema., + palmar erythema,  Skin: Intertriginous groin area has rash - redness with satellite lesions.      Assessment & Plan:

## 2012-12-18 NOTE — Assessment & Plan Note (Signed)
Patient still reports having occasional visual hallucinations. Our social worker is involved in her case. We greatly appreciate her assistance! Patient has an appointment with [facility] and transportation has been arranged. Patient's mother has also been informed about the appointment dates. It is prudent for patient not to miss her appointment. She was emphasized again and again to go to [employer]. Verbalized understanding. She still had a bottle of amitriptyline with her that was taken of away with the clinic appointment.

## 2012-12-18 NOTE — Assessment & Plan Note (Signed)
Patient continues to complain of anxiety and has been calling our clinic for medication refills. She called in yesterday and was given half the dose of the medication that she requested.  Reviewing her old notes and conversations with PCP, [name] [name] was again made aware that she needs to discuss the management of her anxiety with Behavioral health. She acknowledges understanding.

## 2012-12-18 NOTE — Assessment & Plan Note (Signed)
She reported new onset rash in the intertriginous groin area associated with redness and itching. The appearance of the rash was consistent with candidiasis. She also reported some vaginal itching. -Nystatin powder -Fluconazole for candida vulvovaginitis -Keep the area dry. -Wear cotton under clothes

## 2012-12-23 ENCOUNTER — Other Ambulatory Visit: Payer: Self-pay | Admitting: Internal Medicine

## 2012-12-24 ENCOUNTER — Telehealth: Payer: Self-pay | Admitting: *Deleted

## 2012-12-24 NOTE — Telephone Encounter (Signed)
Pharmacy called about meloxicam 15mg  was written 09/03/13 to take one tablet every 12 hours. [facility] would like new Rx for one tablet daily. Pharmacy states they talked with pt and she is taking one daily. Stanton Kidney Brittanny Levenhagen RN 12/25/11 8:45AM

## 2012-12-25 NOTE — Telephone Encounter (Signed)
I'm going to keep it q12h because she can take it every 12 hours. That way if she does decide to take it more than once a day, she will have enough medication.

## 2012-12-28 NOTE — Addendum Note (Signed)
Addended by: Neomia Dear on: 12/28/2012 06:41 PM   Modules accepted: Orders

## 2013-01-11 ENCOUNTER — Other Ambulatory Visit: Payer: Self-pay | Admitting: *Deleted

## 2013-01-11 DIAGNOSIS — F419 Anxiety disorder, unspecified: Secondary | ICD-10-CM

## 2013-01-11 MED ORDER — LORAZEPAM 1 MG PO TABS: 0.5000 mg | ORAL_TABLET | Freq: Three times a day (TID) | ORAL | Status: DC | PRN

## 2013-01-11 NOTE — Telephone Encounter (Signed)
In an effort to continue to taper [name] [name] off of the Ativan, I am prescribing 0.5mg  po TID PRN instead of the 1mg  po BID PRN. As noted previously, she needs to continue to be seen and treated by [facility] for her severe anxiety.

## 2013-01-11 NOTE — Telephone Encounter (Signed)
Last filled 2/19.

## 2013-01-12 NOTE — Telephone Encounter (Signed)
Rx callrd in to [facility] pharmacy per pt - pt aware of Dr Sherrine Maples message. Stanton Kidney Jasiah Buntin RN 01/12/13 10:20AM

## 2013-01-13 ENCOUNTER — Ambulatory Visit (INDEPENDENT_AMBULATORY_CARE_PROVIDER_SITE_OTHER): Payer: Self-pay | Admitting: Internal Medicine

## 2013-01-13 ENCOUNTER — Encounter: Payer: Self-pay | Admitting: Internal Medicine

## 2013-01-13 VITALS — BP 97/75 | HR 76 | Temp 98.0°F | Ht 62.5 in | Wt 166.0 lb

## 2013-01-13 DIAGNOSIS — H00016 Hordeolum externum left eye, unspecified eyelid: Secondary | ICD-10-CM

## 2013-01-13 DIAGNOSIS — H00019 Hordeolum externum unspecified eye, unspecified eyelid: Secondary | ICD-10-CM

## 2013-01-13 DIAGNOSIS — F411 Generalized anxiety disorder: Secondary | ICD-10-CM

## 2013-01-13 DIAGNOSIS — B372 Candidiasis of skin and nail: Secondary | ICD-10-CM

## 2013-01-13 DIAGNOSIS — F419 Anxiety disorder, unspecified: Secondary | ICD-10-CM

## 2013-01-13 MED ORDER — FLUCONAZOLE 150 MG PO TABS: ORAL_TABLET | ORAL | Status: DC

## 2013-01-13 MED ORDER — NYSTATIN 100000 UNIT/GM EX CREA: TOPICAL_CREAM | Freq: Two times a day (BID) | CUTANEOUS | Status: DC

## 2013-01-13 NOTE — Patient Instructions (Addendum)
I have provided you a letter for your housing authority  Please just apply warm compress to eye once daily as needed, make sure the water is luke warm and not too hot, it needs to heal at this point.  If you notice pus or pain again, call and speak to pcp  Moisturize your skin daily  Apply nystatin cream to affected area as needed  Follow up with your [facility] and behavioral health appointments at the end of this month  Return to see Dr. Mittie Bodo A sty (hordeolum) is an infection of a gland in the eyelid located at the base of the eyelash. A sty may develop a white or yellow head of pus. It can be puffy (swollen). Usually, the sty will burst and pus will come out on its own. They do not leave lumps in the eyelid once they drain. A sty is often confused with another form of cyst of the eyelid called a chalazion. Chalazions occur within the eyelid and not on the edge where the bases of the eyelashes are. They often are red, sore and then form firm lumps in the eyelid. CAUSES   Germs (bacteria).  Lasting (chronic) eyelid inflammation. SYMPTOMS   Tenderness, redness and swelling along the edge of the eyelid at the base of the eyelashes.  Sometimes, there is a white or yellow head of pus. It may or may not drain. DIAGNOSIS  An ophthalmologist will be able to distinguish between a sty and a chalazion and treat the condition appropriately.  TREATMENT   Styes are typically treated with warm packs (compresses) until drainage occurs.  In rare cases, medicines that kill germs (antibiotics) may be prescribed. These antibiotics may be in the form of drops, cream or pills.  If a hard lump has formed, it is generally necessary to do a small incision and remove the hardened contents of the cyst in a minor surgical procedure done in the office.  In suspicious cases, your caregiver may send the contents of the cyst to the lab to be certain that it is not a rare, but dangerous form of cancer of the  glands of the eyelid. HOME CARE INSTRUCTIONS   Wash your hands often and dry them with a clean towel. Avoid touching your eyelid. This may spread the infection to other parts of the eye.  Apply heat to your eyelid for 10 to 20 minutes, several times a day, to ease pain and help to heal it faster.  Do not squeeze the sty. Allow it to drain on its own. Wash your eyelid carefully 3 to 4 times per day to remove any pus. SEEK IMMEDIATE MEDICAL CARE IF:   Your eye becomes painful or puffy (swollen).  Your vision changes.  Your sty does not drain by itself within 3 days.  Your sty comes back within a short period of time, even with treatment.  You have redness (inflammation) around the eye.  You have a fever. Document Released: 07/24/2005 Document Revised: 01/06/2012 Document Reviewed: 03/28/2009 [facility] Patient Information 2013 [place], [place].  General Instructions:  Treatment Goals:  Goals (1 Years of Data) as of 01/13/13   None     Progress Toward Treatment Goals:  Treatment Goal 01/13/2013  Other  at goal   Self Care Goals & Plans:  Self Care Goal 12/17/2012  Manage my medications take my medicines as prescribed; bring my medications to every visit; refill my medications on time; follow the sick day instructions if I am sick  Care Management & Community Referrals:  Referral 01/13/2013  Referrals made for care management support none needed

## 2013-01-13 NOTE — Assessment & Plan Note (Signed)
Return of rash onset in external groin area.  Was unable to get nystatin powder as not available in pharmacy but only cream available.  Denies any discharge or vaginal itching or dysuria.    -nystatin cream prn

## 2013-01-13 NOTE — Assessment & Plan Note (Signed)
Has appointment at [facility] 3/27 and behavioral health on 3/28.  Has prescription for ativan but claims difficult to cut pills in half.  Will likely need pill cutter.  Currently complaining of possible bed bugs and dust mites in apartment complex.    -letter for exterminator to evaluate her home based on complaints -must follow up with [facility] and behavioral health appiontments -ativan prn -follow up with pcp

## 2013-01-13 NOTE — Assessment & Plan Note (Signed)
Appears to be healing now. She continues to place compress on approximately three times a day and i suspect that the compress have been very hot and my have irritated the skin above the eyelid.  No visible pus or head and no drainage.  Non-tender.  Denies vision disturbance or change.  Cosmetically unattractive to her.    -recommend observation now. If needed apply only luke warm compress to area once a day as needed

## 2013-01-13 NOTE — Progress Notes (Signed)
Subjective:   [name] female   DOB: November 12, 1960 52 y.o.   MRN: [id number]  HPI: [name] [name] is a 52 y.o. white female with PMH of Hepatitis C, alcoholic cirrhosis, COPD, and anxiety presenting to clinic today for acute visit of left eye sty.  She claims the sty is still present from her last visit with Dr. Sherrine Maples, pcp, in 11/2012.  She has been applying a warm compress TID and claimed it did come to head several weeks ago and spontaneously drained.  Currently it appears to be healing, not raised, erythematous, but non-tender, with no head.    She also reports diffuse body itching secondary to thinking she has bed bugs or dust mites in her apartment after being told this from a fellow resident in the same complex.  She does have sites of excoriations on her extremities from scratching and dry skin.    She also reports return of groin itching but denies any vaginal discharge or itching like she had last time. She was unable to get the nystatin powder since only cream is available.    Otherwise, she denies any chest pain, shortness of breath, fever, chills, abdominal pain, or dysuria at this time.    She has appointments to see [facility] at the end of this month.    Past Medical History  Diagnosis Date  . Alcoholic cirrhosis   . COPD (chronic obstructive pulmonary disease)   . Heart palpitations   . cervical Cancer   . Anxiety   . Portal hypertension   . Esophageal varices   . Cholelithiasis   . Splenomegaly   . Fatty liver   . Esophagitis 2010  . Tubulovillous adenoma of colon   . Visual hallucination     since 09/2010/notes 10/30/2012  . Hepatitis C     chronic hepatitis C and Steatohepatitis (hep grade 2, stage 2-3) per 05/13/08 liver biopsy   Current Outpatient Prescriptions  Medication Sig Dispense Refill  . albuterol (PROVENTIL HFA;VENTOLIN HFA) 108 (90 BASE) MCG/ACT inhaler Inhale 2 puffs into the lungs every 6 (six) hours as needed  for wheezing.  1 Inhaler  4  . Ipratropium-Albuterol (COMBIVENT) 20-100 MCG/ACT AERS respimat Inhale 1 puff into the lungs every 6 (six) hours as needed for wheezing.  1 Inhaler  11  . lactulose (CHRONULAC) 10 GM/15ML solution Take 45 mLs (30 g total) by mouth daily.  240 mL  2  . LORazepam (ATIVAN) 1 MG tablet Take 0.5 tablets (0.5 mg total) by mouth 3 (three) times daily as needed for anxiety.  90 tablet  0  . meloxicam (MOBIC) 15 MG tablet TAKE ONE TABLET BY MOUTH EVERY 12 HOURS  30 tablet  0  . pantoprazole (PROTONIX) 40 MG tablet Take 1 tablet (40 mg total) by mouth daily.  30 tablet  11  . potassium chloride SA (K-DUR,KLOR-CON) 20 MEQ tablet Take 1 tablet (20 mEq total) by mouth daily.  30 tablet  2  . propranolol (INDERAL) 20 MG tablet Take 1 tablet (20 mg total) by mouth daily.  60 tablet  11  . traMADol (ULTRAM) 50 MG tablet Take 1 tablet (50 mg total) by mouth every 8 (eight) hours as needed. For pain.  60 tablet  0  . nystatin cream (MYCOSTATIN) Apply topically 2 (two) times daily.  30 g  0   No current facility-administered medications for this visit.   Family History  Problem Relation Age of Onset  . Cervical cancer  Sister   . Breast cancer Maternal Aunt   . Heart disease Father 63    died from MI  . Cervical cancer Daughter    History   Social History  . Marital Status: Married    Spouse Name: N/A    Number of Children: N/A  . Years of Education: N/A   Social History Main Topics  . Smoking status: Former Smoker -- 0.10 packs/day for 40 years    Types: Cigarettes    Quit date: 10/16/2011  . Smokeless tobacco: Never Used     Comment: smoked about a week ago when she got upset-brother still smokes  . Alcohol Use: No     Comment: months since last drink  . Drug Use: No  . Sexually Active: None   Other Topics Concern  . None   Social History Narrative  . None   Review of Systems: Constitutional: Denies fever, chills, diaphoresis, appetite change and fatigue.   HEENT: right ear discomfort.  Denies photophobia, eye pain, redness, hearing loss, congestion, sore throat, rhinorrhea, sneezing, mouth sores, trouble swallowing, neck pain, neck stiffness and tinnitus.   Respiratory: Denies SOB, DOE, cough, chest tightness,  and wheezing.   Cardiovascular: Denies chest pain, palpitations and leg swelling.  Gastrointestinal:  Denies nausea, vomiting, abdominal pain, diarrhea, constipation, blood in stool and abdominal distention.  Genitourinary: Groin area itching.  Denies dysuria, urgency, frequency, hematuria, flank pain and difficulty urinating.  Musculoskeletal: Denies myalgias, back pain, joint swelling, arthralgias and gait problem.  Skin: Rash, dryness, itching.  Denies pallor and wound.  Neurological: Denies dizziness, seizures, syncope, weakness, light-headedness, numbness and headaches.  Hematological: Denies adenopathy. Easy bruising, personal or family bleeding history  Psychiatric/Behavioral: Anxiety.  Denies suicidal ideation, mood changes, confusion, nervousness, sleep disturbance and agitation  Objective:  Physical Exam: Filed Vitals:   01/13/13 1035  BP: 97/75  Pulse: 76  Temp: 98 F (36.7 C)  TempSrc: Oral  Height: 5' 2.5" (1.588 m)  Weight: 166 lb (75.297 kg)  SpO2: 96%   Constitutional: Vital signs reviewed.  Patient is a well-developed and well-nourished female in no acute distress and cooperative with exam. Alert and oriented x3.  Head: Normocephalic and atraumatic Ear: TM normal bilaterally, hardened cerumen on lateral aspect of right ear.  Mouth: no erythema or exudates, MMM Eyes: PERRL, EOMI, conjunctivae normal, No scleral icterus. Left eye lid healing sty, not raised, erythematous, but non-tender, with no head.   Nose: pink, no visible mass or blood.   Neck: Supple, Trachea midline normal ROM.  Cardiovascular: RRR, S1 normal, S2 normal, no MRG, pulses symmetric and intact bilaterally Pulmonary/Chest: CTAB, no wheezes,  rales, or rhonchi Abdominal: Soft. Non-tender, non-distended, bowel sounds are normal, no masses, organomegaly, or guarding present.  GU: no CVA tenderness. Denies vaginal discharge or itching.   Musculoskeletal: No joint deformities, erythema, or stiffness, ROM full and no nontender Hematology: no cervical, inginal, or axillary adenopathy.  Neurological: A&O x3, Strength is normal and symmetric bilaterally, cranial nerve II-XII are grossly intact, no focal motor deficit, sensory intact to light touch bilaterally.  Skin: Warm, dry and intact. No cyanosis, or clubbing. mild patches of excoriations on hands and arms and legs.  Psychiatric: anxious. Speech and behavior is normal. Judgment and thought content normal. Cognition and memory are normal.   Assessment & Plan:  Discussed with Dr. Eben Burow -given letter for exterminator evaluation of home for possible dust mites and bed bugs -sty appears to be healing, apply only luke warm compress -she  wishes to follow up only with Dr. Sherrine Maples on next visit for her issues -follow up [facility]

## 2013-01-15 ENCOUNTER — Other Ambulatory Visit: Payer: Self-pay | Admitting: Internal Medicine

## 2013-01-15 DIAGNOSIS — Z1231 Encounter for screening mammogram for malignant neoplasm of breast: Secondary | ICD-10-CM

## 2013-01-20 ENCOUNTER — Ambulatory Visit: Payer: Self-pay

## 2013-01-22 ENCOUNTER — Encounter (HOSPITAL_COMMUNITY): Payer: Self-pay | Admitting: Emergency Medicine

## 2013-01-22 ENCOUNTER — Telehealth (HOSPITAL_COMMUNITY): Payer: Self-pay | Admitting: Emergency Medicine

## 2013-01-22 ENCOUNTER — Encounter: Payer: Self-pay | Admitting: Physical Medicine & Rehabilitation

## 2013-01-22 ENCOUNTER — Ambulatory Visit (HOSPITAL_BASED_OUTPATIENT_CLINIC_OR_DEPARTMENT_OTHER): Payer: No Typology Code available for payment source | Admitting: Physical Medicine & Rehabilitation

## 2013-01-22 ENCOUNTER — Ambulatory Visit: Payer: Self-pay

## 2013-01-22 ENCOUNTER — Encounter: Payer: Self-pay | Admitting: *Deleted

## 2013-01-22 ENCOUNTER — Emergency Department (HOSPITAL_COMMUNITY)
Admission: EM | Admit: 2013-01-22 | Discharge: 2013-01-22 | Disposition: A | Discharge status: Home / Self Care | Payer: No Typology Code available for payment source | Attending: Emergency Medicine | Admitting: Emergency Medicine

## 2013-01-22 ENCOUNTER — Encounter: Payer: Self-pay | Attending: Physical Medicine & Rehabilitation

## 2013-01-22 VITALS — BP 117/74 | HR 63 | Resp 14 | Ht 62.0 in | Wt 168.0 lb

## 2013-01-22 DIAGNOSIS — F209 Schizophrenia, unspecified: Secondary | ICD-10-CM | POA: Insufficient documentation

## 2013-01-22 DIAGNOSIS — G8929 Other chronic pain: Secondary | ICD-10-CM | POA: Insufficient documentation

## 2013-01-22 DIAGNOSIS — Z79899 Other long term (current) drug therapy: Secondary | ICD-10-CM | POA: Insufficient documentation

## 2013-01-22 DIAGNOSIS — M545 Low back pain, unspecified: Secondary | ICD-10-CM | POA: Insufficient documentation

## 2013-01-22 DIAGNOSIS — F411 Generalized anxiety disorder: Secondary | ICD-10-CM | POA: Insufficient documentation

## 2013-01-22 DIAGNOSIS — M549 Dorsalgia, unspecified: Secondary | ICD-10-CM

## 2013-01-22 DIAGNOSIS — Z8679 Personal history of other diseases of the circulatory system: Secondary | ICD-10-CM | POA: Insufficient documentation

## 2013-01-22 DIAGNOSIS — K766 Portal hypertension: Secondary | ICD-10-CM | POA: Insufficient documentation

## 2013-01-22 DIAGNOSIS — M4180 Other forms of scoliosis, site unspecified: Secondary | ICD-10-CM | POA: Insufficient documentation

## 2013-01-22 DIAGNOSIS — B1920 Unspecified viral hepatitis C without hepatic coma: Secondary | ICD-10-CM | POA: Insufficient documentation

## 2013-01-22 DIAGNOSIS — Z8619 Personal history of other infectious and parasitic diseases: Secondary | ICD-10-CM | POA: Insufficient documentation

## 2013-01-22 DIAGNOSIS — Z008 Encounter for other general examination: Secondary | ICD-10-CM | POA: Insufficient documentation

## 2013-01-22 DIAGNOSIS — Z8719 Personal history of other diseases of the digestive system: Secondary | ICD-10-CM | POA: Insufficient documentation

## 2013-01-22 DIAGNOSIS — Z7189 Other specified counseling: Secondary | ICD-10-CM

## 2013-01-22 DIAGNOSIS — M4120 Other idiopathic scoliosis, site unspecified: Secondary | ICD-10-CM

## 2013-01-22 DIAGNOSIS — F17200 Nicotine dependence, unspecified, uncomplicated: Secondary | ICD-10-CM | POA: Insufficient documentation

## 2013-01-22 DIAGNOSIS — K7460 Unspecified cirrhosis of liver: Secondary | ICD-10-CM | POA: Insufficient documentation

## 2013-01-22 DIAGNOSIS — J4489 Other specified chronic obstructive pulmonary disease: Secondary | ICD-10-CM | POA: Insufficient documentation

## 2013-01-22 DIAGNOSIS — J449 Chronic obstructive pulmonary disease, unspecified: Secondary | ICD-10-CM | POA: Insufficient documentation

## 2013-01-22 DIAGNOSIS — Z8541 Personal history of malignant neoplasm of cervix uteri: Secondary | ICD-10-CM | POA: Insufficient documentation

## 2013-01-22 LAB — CBC
HCT: 42.2 % (ref 36.0–46.0)
MCHC: 34.6 g/dL (ref 30.0–36.0)
Platelets: 104 10*3/uL — ABNORMAL LOW (ref 150–400)
RDW: 14.2 % (ref 11.5–15.5)
WBC: 7.1 10*3/uL (ref 4.0–10.5)

## 2013-01-22 LAB — COMPREHENSIVE METABOLIC PANEL
AST: 86 U/L — ABNORMAL HIGH (ref 0–37)
Albumin: 3.2 g/dL — ABNORMAL LOW (ref 3.5–5.2)
Alkaline Phosphatase: 178 U/L — ABNORMAL HIGH (ref 39–117)
BUN: 4 mg/dL — ABNORMAL LOW (ref 6–23)
Creatinine, Ser: 0.52 mg/dL (ref 0.50–1.10)
Potassium: 3.7 mEq/L (ref 3.5–5.1)
Total Protein: 7 g/dL (ref 6.0–8.3)

## 2013-01-22 LAB — ETHANOL: Alcohol, Ethyl (B): <11 mg/dL (ref 0–11)

## 2013-01-22 LAB — RAPID URINE DRUG SCREEN, HOSP PERFORMED
Barbiturates: NOT DETECTED
Benzodiazepines: NOT DETECTED

## 2013-01-22 LAB — ACETAMINOPHEN LEVEL: Acetaminophen (Tylenol), Serum: <15 ug/mL (ref 10–30)

## 2013-01-22 MED ORDER — ZIPRASIDONE HCL 40 MG PO CAPS: 40.0000 mg | ORAL_CAPSULE | Freq: Two times a day (BID) | ORAL | Status: DC

## 2013-01-22 MED ORDER — METHOCARBAMOL 500 MG PO TABS: 500.0000 mg | ORAL_TABLET | Freq: Every day | ORAL | Status: DC

## 2013-01-22 MED ORDER — METHOCARBAMOL 500 MG PO TABS: 500.0000 mg | ORAL_TABLET | Freq: Two times a day (BID) | ORAL | Status: DC

## 2013-01-22 NOTE — ED Notes (Signed)
Pt called in main ED waiting area with no response 

## 2013-01-22 NOTE — ED Notes (Addendum)
Pt sent from MD office for detox from Ativan. See notes sent with pt. Pt was started on Geodon 40mg  po, one last night and one this am.

## 2013-01-22 NOTE — Patient Instructions (Addendum)
Nonnarcotic treatment program due to elevated opioid risk total score of 11

## 2013-01-22 NOTE — ED Notes (Signed)
Pt to ED from her MD's office for medical clearance for possible detox of benzo's.

## 2013-01-22 NOTE — Progress Notes (Signed)
Pt presents to triage nurse c/o hallucinations, depression, anxiety, itchiness, hearing voices. Went to [facility] at 1630 3/27 and was told she has bi-polar, schizophrenic depression? She has a note from [employer], stating that she needs geodon 40mg  bid but she also states she is to inform [facility] that she has overused her lorazepam 1mg  filled on 3/19 #45, she has 2 halves left and tramadol 50mg  filled 3/5 #60, she has 1 left. She saw dr Wynn Banker today and he called a muscle relaxer in to [facility] rob[facility]in. She also has a note from [employer] stating that she may need to be admitted for benzo detox. i have all her meds and the notes. Please advise- sent to attendings and dr Sherrine Maples

## 2013-01-22 NOTE — Progress Notes (Signed)
Subjective:    [name], female    DOB: Jun 27, 1961, 52 y.o.   MRN: [id number] Chief complaint mid back and low back pain that go up and down her spine HPI Low back pain since 1982 after pregnancy.  Saw a chiropracter for 1 year Has had MRI of the lumbar spine showing mild scoliosis,Small disc bulges, no evidence of nerve root impingement. No significant facet arthritis Has not tried any physical therapy Gets only partial relief with tramadol 50 mg 3 times a day and Mobic 15 mg a day  Pain Inventory Average Pain 10 Pain Right Now 10 My pain is constant, burning, tingling and aching  In the last 24 hours, has pain interfered with the following? General activity 10 Relation with others 10 Enjoyment of life 10 What TIME of day is your pain at its worst? all day Sleep (in general) Poor  Pain is worse with: walking, bending, sitting, inactivity and standing Pain improves with: medication Relief from Meds: n/a  Mobility use a cane how many minutes can you walk? 1-3 ability to climb steps?  no do you drive?  no  Function not employed: date last employed 08 disabled: date disabled 09 I need assistance with the following:  dressing, bathing, meal prep, household duties and shopping  Neuro/Psych bladder control problems bowel control problems weakness numbness tremor tingling trouble walking dizziness confusion depression anxiety loss of taste or smell  Prior Studies Any changes since last visit?  no Clinical Data: Low back pain and right foot pain.  MRI LUMBAR SPINE WITHOUT CONTRAST  Technique: Multiplanar and multiecho pulse sequences of the lumbar  spine were obtained without intravenous contrast.  Comparison: None.  Findings: Scan extends from T10-11 through S4. Tip of the conus is  at L1 and appears normal. There is a slight rotoscoliosis of the  lumbar spine. There is a slight spondylolisthesis of T12 on L1 of  2 mm and a retrolisthesis of L1  on L2 of 5 mm. No neural  impingement at either level.  There are small bulges of the discs from L1-2 through L5-L1 with no  neural impingement at any level. No significant foraminal stenosis  or severe facet joint arthritis.  The paraspinal soft tissues are normal. Vestigial disc at S1-2.  IMPRESSION:  Slight lumbar scoliosis with multilevel slight degenerative disc  disease without neural impingement.  Physicians involved in your care [facility]   Family History  Problem Relation Age of Onset  . Cervical cancer Sister   . Breast cancer Maternal Aunt   . Heart disease Father 4    died from MI  . Cervical cancer Daughter    History   Social History  . Marital Status: Married    Spouse Name: N/A    Number of Children: N/A  . Years of Education: N/A   Social History Main Topics  . Smoking status: Current Some Day Smoker -- 0.10 packs/day for 40 years    Types: Cigarettes    Last Attempt to Quit: 10/16/2011  . Smokeless tobacco: Never Used     Comment: smoked about a week ago when she got upset-brother still smokes  . Alcohol Use: No     Comment: months since last drink  . Drug Use: No  . Sexually Active: None   Other Topics Concern  . None   Social History Narrative  . None   Past Surgical History  Procedure Laterality Date  . Cervical cancer surgery    . Abdominal  hysterectomy    . Tubal ligation    . Esophagogastroduodenoscopy  10/18/2011    Procedure: ESOPHAGOGASTRODUODENOSCOPY (EGD);  Surgeon: Rob Bunting, MD;  Location: [facility];  Service: Endoscopy;  Laterality: N/A;   Past Medical History  Diagnosis Date  . Alcoholic cirrhosis   . COPD (chronic obstructive pulmonary disease)   . Heart palpitations   . cervical Cancer   . Anxiety   . Portal hypertension   . Esophageal varices   . Cholelithiasis   . Splenomegaly   . Fatty liver   . Esophagitis 2010  . Tubulovillous adenoma of colon   . Visual hallucination     since 09/2010/notes 10/30/2012   . Hepatitis C     chronic hepatitis C and Steatohepatitis (hep grade 2, stage 2-3) per 05/13/08 liver biopsy   BP 117/74  Pulse 63  Resp 14  Ht 5\' 2"  (1.575 m)  Wt 168 lb (76.204 kg)  BMI 30.72 kg/m2  SpO2 94%     Review of Systems  Constitutional: Positive for fever, chills and unexpected weight change.  Respiratory: Positive for cough, shortness of breath and wheezing.   Gastrointestinal: Positive for nausea, abdominal pain and constipation.  Genitourinary: Positive for difficulty urinating.  Neurological: Positive for dizziness, tremors, weakness and numbness.  Hematological: Bruises/bleeds easily.  Psychiatric/Behavioral: Positive for confusion and dysphoric mood. The patient is nervous/anxious.   All other systems reviewed and are negative.       Objective:   Physical Exam  Musculoskeletal:       Lumbar back: She exhibits pain.       Back:  Neurological: She displays no atrophy. Gait abnormal.  Gait is forward flexed decreased arm swing and short step length  Psychiatric: Thought content normal. Her affect is blunt. Her speech is delayed. She is slowed. Cognition and memory are normal. She is inattentive.   Spine: Inspection: Mild to moderate scoliosis levoconvex at lower thoracic and dextroconvex at lower lumbar Mild to moderate tenderness to palpation along the entire paraspinal muscles mainly on the right side greater than left side from thoracic to lumbar areas Limited lumbar and thoracic range of motion with limited flexion extension lateral rotation and bending. Hips have no limitation or pain No tenderness over the greater trochanters No tenderness over the PSIS bilaterally Knees have good range of motion Skin has stasis dermatitis right greater than left ankle Motor strength is 4/5 in bilateral deltoid, biceps, triceps, grip, hip flexor, knee extensors, ankle dorsi flexors and plantar flexors Sensory exam intact to pinprick And light touch in bilateral  upper extremity       Assessment & Plan:  1. Thoracic andLumbar pain chronic Without evidence of nerve root or spinal cord impingement. The patient has not had any physical therapy and is chronically deconditioned from her additional diagnoses of hepatitis C with cirrhosis as well as COPD. Patient would like to have narcotic analgesic pain medications,however her opioid risk total score is High at 11. She disputes the alcohol history.  She would benefit from physical therapy although does not seem to be very motivated to pursue this. We will place the order.She does get partial rolling up a tramadol and we continued this I would not go to a higher dose because of her history of cirrhosis.  Her pain appears to be myofascial and she may benefit from trigger point injections. She may be a candidate for medial branch blocks as well.

## 2013-01-22 NOTE — ED Provider Notes (Signed)
History    This chart was scribed for non-physician practitioner working with Derwood Kaplan, MD by Leone Payor, ED Scribe. This patient was seen in room TR04C/TR04C and the patient's care was started at 1448.   CSN: [id number]  Arrival date & time 01/22/13  1448   First MD Initiated Contact with Patient 01/22/13 1837      Chief Complaint  Patient presents with  . Medical Clearance     The history is provided by the patient. No language interpreter was used.    [name] [name] is a 52 y.o. female who presents to the Emergency Department requesting medical clearance today. Pt is here from her PCP for detox from lorazepam. Pt states she was taking lorazepam 1 mg BID and now has been reduced to 0.5 mg BID. Pt was recently diagnosed with bipolar disorder and schizophrenic "depressant" and was started on 40 mg Geodon BID. Pt states she has been fidgety and cannot concentrate. She states she wants her medication. She denies SI, HI.     Pt is a current everyday smoker and occasional alcohol user.  Past Medical History  Diagnosis Date  . Alcoholic cirrhosis   . COPD (chronic obstructive pulmonary disease)   . Heart palpitations   . cervical Cancer   . Anxiety   . Portal hypertension   . Esophageal varices   . Cholelithiasis   . Splenomegaly   . Fatty liver   . Esophagitis 2010  . Tubulovillous adenoma of colon   . Visual hallucination     since 09/2010/notes 10/30/2012  . Hepatitis C     chronic hepatitis C and Steatohepatitis (hep grade 2, stage 2-3) per 05/13/08 liver biopsy    Past Surgical History  Procedure Laterality Date  . Cervical cancer surgery    . Abdominal hysterectomy    . Tubal ligation    . Esophagogastroduodenoscopy  10/18/2011    Procedure: ESOPHAGOGASTRODUODENOSCOPY (EGD);  Surgeon: Rob Bunting, MD;  Location: [facility];  Service: Endoscopy;  Laterality: N/A;    Family History  Problem Relation Age of Onset  . Cervical cancer Sister   . Breast cancer  Maternal Aunt   . Heart disease Father 53    died from MI  . Cervical cancer Daughter     History  Substance Use Topics  . Smoking status: Current Some Day Smoker -- 0.10 packs/day for 40 years    Types: Cigarettes    Last Attempt to Quit: 10/16/2011  . Smokeless tobacco: Never Used     Comment: smoked about a week ago when she got upset-brother still smokes  . Alcohol Use: No     Comment: months since last drink    OB History   Grav Para Term Preterm Abortions TAB SAB Ect Mult Living   3 2   1  1   2       Review of Systems  Constitutional: Negative for fever.  Psychiatric/Behavioral: Negative for suicidal ideas and self-injury.    Allergies  Amitriptyline  Home Medications   Current Outpatient Rx  Name  Route  Sig  Dispense  Refill  . albuterol (PROVENTIL HFA;VENTOLIN HFA) 108 (90 BASE) MCG/ACT inhaler   Inhalation   Inhale 4 puffs into the lungs daily.         . Ipratropium-Albuterol (COMBIVENT) 20-100 MCG/ACT AERS respimat   Inhalation   Inhale 1 puff into the lungs every 6 (six) hours as needed for wheezing.   1 Inhaler   11   . lactulose (  CHRONULAC) 10 GM/15ML solution   Oral   Take 45 mLs (30 g total) by mouth daily.   240 mL   2   . LORazepam (ATIVAN) 1 MG tablet   Oral   Take 0.5 tablets (0.5 mg total) by mouth 3 (three) times daily as needed for anxiety.   90 tablet   0   . meloxicam (MOBIC) 15 MG tablet      TAKE ONE TABLET BY MOUTH EVERY 12 HOURS   30 tablet   0   . pantoprazole (PROTONIX) 40 MG tablet   Oral   Take 1 tablet (40 mg total) by mouth daily.   30 tablet   11   . potassium chloride SA (K-DUR,KLOR-CON) 20 MEQ tablet   Oral   Take 1 tablet (20 mEq total) by mouth daily.   30 tablet   2   . traMADol (ULTRAM) 50 MG tablet   Oral   Take 1 tablet (50 mg total) by mouth every 8 (eight) hours as needed. For pain.   60 tablet   0   . ziprasidone (GEODON) 40 MG capsule   Oral   Take 40 mg by mouth 2 (two) times daily  with a meal.           BP 127/71  Pulse 68  Temp(Src) 97.9 F (36.6 C) (Oral)  Resp 18  SpO2 97%  Physical Exam  Nursing note and vitals reviewed. Constitutional: She is oriented to person, place, and time. She appears well-developed and well-nourished. No distress.  HENT:  Head: Normocephalic and atraumatic.  Eyes: EOM are normal.  Neck: Neck supple. No tracheal deviation present.  Cardiovascular: Normal rate.   Pulmonary/Chest: Effort normal. No respiratory distress.  Musculoskeletal: Normal range of motion.  Neurological: She is alert and oriented to person, place, and time.  Skin: Skin is warm and dry.  Psychiatric: She has a normal mood and affect. Her behavior is normal.    ED Course  Procedures (including critical care time)  DIAGNOSTIC STUDIES: Oxygen Saturation is 97% on room air, adequate by my interpretation.    COORDINATION OF CARE: 6:49 PM Discussed treatment plan with pt at bedside and pt agreed to plan.    No results found.  Results for orders placed during the hospital encounter of 01/22/13  CBC      Result Value Range   WBC 7.1  4.0 - 10.5 K/uL   RBC 4.25  3.87 - 5.11 MIL/uL   Hemoglobin 14.6  12.0 - 15.0 g/dL   HCT 16.1  09.6 - 04.5 %   MCV 99.3  78.0 - 100.0 fL   MCH 34.4 (*) 26.0 - 34.0 pg   MCHC 34.6  30.0 - 36.0 g/dL   RDW 40.9  81.1 - 91.4 %   Platelets 104 (*) 150 - 400 K/uL  COMPREHENSIVE METABOLIC PANEL      Result Value Range   Sodium 138  135 - 145 mEq/L   Potassium 3.7  3.5 - 5.1 mEq/L   Chloride 103  96 - 112 mEq/L   CO2 28  19 - 32 mEq/L   Glucose, Bld 104 (*) 70 - 99 mg/dL   BUN 4 (*) 6 - 23 mg/dL   Creatinine, Ser 7.82  0.50 - 1.10 mg/dL   Calcium 9.0  8.4 - 95.6 mg/dL   Total Protein 7.0  6.0 - 8.3 g/dL   Albumin 3.2 (*) 3.5 - 5.2 g/dL   AST 86 (*) 0 - 37  U/L   ALT 61 (*) 0 - 35 U/L   Alkaline Phosphatase 178 (*) 39 - 117 U/L   Total Bilirubin 1.3 (*) 0.3 - 1.2 mg/dL   GFR calc non Af Amer >90  >90 mL/min   GFR calc  Af Amer >90  >90 mL/min  ETHANOL      Result Value Range   Alcohol, Ethyl (B) <11  0 - 11 mg/dL  ACETAMINOPHEN LEVEL      Result Value Range   Acetaminophen (Tylenol), Serum <15.0  10 - 30 ug/mL  SALICYLATE LEVEL      Result Value Range   Salicylate Lvl <2.0 (*) 2.8 - 20.0 mg/dL  URINE RAPID DRUG SCREEN (HOSP PERFORMED)      Result Value Range   Opiates NONE DETECTED  NONE DETECTED   Cocaine NONE DETECTED  NONE DETECTED   Benzodiazepines NONE DETECTED  NONE DETECTED   Amphetamines NONE DETECTED  NONE DETECTED   Tetrahydrocannabinol NONE DETECTED  NONE DETECTED   Barbiturates NONE DETECTED  NONE DETECTED   No results found.    No diagnosis found.  Medications and compliance reviewed with patient.  Patient continues to take lorazepam but at a reduced dose.  Patient advised of potential for seizures with sudden cessation.  Patient evaluated at [facility], diagnosed with bipolar disorder and schizophrenia.  Started on geodon, patient states her anxiety and depression is much improved.  Denies SI/HI.  Patient to follow-up with her PCP to begin tapering of lorazepam.  MDM    I personally performed the services described in this documentation, which was scribed in my presence. The recorded information has been reviewed and is accurate.     Jimmye Norman, NP 01/22/13 [phone]

## 2013-01-22 NOTE — Progress Notes (Signed)
Psychiatric issue requiring Psychiatric evaluation.  Agree with transferring to the ED to facilitate Psychiatric evaluation.

## 2013-01-23 NOTE — ED Provider Notes (Signed)
Medical screening examination/treatment/procedure(s) were performed by non-physician practitioner and as supervising physician I was immediately available for consultation/collaboration.  Koury Roddy, MD 01/23/13 1558 

## 2013-01-25 ENCOUNTER — Other Ambulatory Visit: Payer: Self-pay | Admitting: *Deleted

## 2013-01-25 DIAGNOSIS — F419 Anxiety disorder, unspecified: Secondary | ICD-10-CM

## 2013-01-25 NOTE — Telephone Encounter (Signed)
Pt calls and states ED released her after evaluation Friday. States she needs meds refilled. Please advise, appt today for med refills? Or shall you refill meds?

## 2013-01-26 ENCOUNTER — Telehealth: Payer: Self-pay | Admitting: *Deleted

## 2013-01-26 DIAGNOSIS — F419 Anxiety disorder, unspecified: Secondary | ICD-10-CM

## 2013-01-26 NOTE — Telephone Encounter (Signed)
Pt calls and is upset because she will not be able to get refills until she is seen, appt made for thurs 4/3 at 0815, blocked time for with pt, she wants her meds filled before thurs, states she cannot go without her meds, is this acceptable?

## 2013-01-27 ENCOUNTER — Encounter: Payer: Self-pay | Admitting: Licensed Clinical Social Worker

## 2013-01-27 MED ORDER — LORAZEPAM 1 MG PO TABS: 0.5000 mg | ORAL_TABLET | ORAL | Status: DC | PRN

## 2013-01-27 NOTE — Telephone Encounter (Signed)
Will give two 1mg  tablets to last through tomorrow until she can be seen.

## 2013-01-27 NOTE — Progress Notes (Unsigned)
Case discussed with PCP.  Pt in need of psychiatry appointment other than [facility], as pt has not been having the best results.  Pt has returned [facility] transportation and is able to receive transportation to [facility] for Medical Appt's.  CSW placed call to [facility] for psychiatry appt.  Awaiting return call.  Once psychiatry appt is scheduled, will need to confirm/provide transportation for [name]. 01/29/13: Received call back from [facility] Health requesting [facility] [place] records prior to appt being scheduled.  CSW placed ROI in [place] request today.

## 2013-01-28 ENCOUNTER — Ambulatory Visit (INDEPENDENT_AMBULATORY_CARE_PROVIDER_SITE_OTHER): Payer: No Typology Code available for payment source | Admitting: Internal Medicine

## 2013-01-28 ENCOUNTER — Encounter: Payer: Self-pay | Admitting: Internal Medicine

## 2013-01-28 VITALS — BP 127/75 | HR 67 | Temp 97.7°F | Ht 62.0 in | Wt 167.4 lb

## 2013-01-28 DIAGNOSIS — Z8541 Personal history of malignant neoplasm of cervix uteri: Secondary | ICD-10-CM

## 2013-01-28 DIAGNOSIS — Z0000 Encounter for general adult medical examination without abnormal findings: Secondary | ICD-10-CM

## 2013-01-28 DIAGNOSIS — K7460 Unspecified cirrhosis of liver: Secondary | ICD-10-CM

## 2013-01-28 DIAGNOSIS — R109 Unspecified abdominal pain: Secondary | ICD-10-CM | POA: Insufficient documentation

## 2013-01-28 DIAGNOSIS — C801 Malignant (primary) neoplasm, unspecified: Secondary | ICD-10-CM

## 2013-01-28 DIAGNOSIS — F419 Anxiety disorder, unspecified: Secondary | ICD-10-CM

## 2013-01-28 DIAGNOSIS — F411 Generalized anxiety disorder: Secondary | ICD-10-CM

## 2013-01-28 DIAGNOSIS — B182 Chronic viral hepatitis C: Secondary | ICD-10-CM

## 2013-01-28 DIAGNOSIS — G8929 Other chronic pain: Secondary | ICD-10-CM

## 2013-01-28 MED ORDER — MELOXICAM 15 MG PO TABS: ORAL_TABLET | ORAL | Status: DC

## 2013-01-28 MED ORDER — ZIPRASIDONE HCL 40 MG PO CAPS: 40.0000 mg | ORAL_CAPSULE | Freq: Two times a day (BID) | ORAL | Status: DC

## 2013-01-28 MED ORDER — LACTULOSE 10 GM/15ML PO SOLN: 30.0000 g | Freq: Every day | ORAL | Status: DC

## 2013-01-28 MED ORDER — LORAZEPAM 1 MG PO TABS: 0.5000 mg | ORAL_TABLET | Freq: Three times a day (TID) | ORAL | Status: DC | PRN

## 2013-01-28 MED ORDER — TRAMADOL HCL 50 MG PO TABS: 50.0000 mg | ORAL_TABLET | Freq: Three times a day (TID) | ORAL | Status: DC | PRN

## 2013-01-28 NOTE — Progress Notes (Signed)
[name], female   DOB: 02-Apr-1961, 52 y.o.   MRN: [id number]  Subjective:   [name] female   DOB: Apr 01, 1961 52 y.o.   MRN: [id number]  HPI: [name] [name] is a 52 y.o. F liver cirrhosis from chronic hep C and alcohol abuse, significant anxiety, and visual hallucinations, presents to the clinic to discuss her medications after over taking her Ativan and Tramadol on 3/27.  She has been seen at [facility] for her anxiety, but I am not sure how frequently she has been going or if she is receiving the therapy that she needs. She was apparently seen on 3/27 at [facility] and was given a small supply of Geodon,  Which she states did seem to help her sleep better. She was also diagnosed with bipolar disorder and "schizophrenic depression"; unfortunately, I do not have these records. The clinic was called regarding [name] [name] to notify us that the patient has been over taking her Tramadol and Ativan. She came into clinic on 3/28 and it was confirmed that she had only one pill left in her Ativan that was just refilled 3/19 and one of the Tramadol that was filled 3/5.   I have been working with [name] [name] to wean her off of the Ativan. My goal was to have her psychiatric issues treated by [facility], as they are mental health professionals. However, I am not sure how effective her visits with them have been. I know that despite my multiple conversations with [name] [name] regarding weaning her Ativan, and her acknowledgement regarding each conversation, she will call into the clinic unhappy about her decreasing Ativan dose. Due to her poor success at [facility], I would like for her to be seen at [facility].  She currently denies any visual hallucinations, but states that she continues to hear knocking on her screen door and windows. She also hears noises in her living room that sound like someone is in there when she is the only one at home. She has heard some distant  voices, but this could be from her neighbors.   She also states that her soon-to-be ex-husband's friends always seem to be around her home. She states that she saw them up on one of her neighbor's roof and called the management company but was told that they were actually working on the roof. Another time they were all standing in the yard behind her home. Her husband is in jail until 2015, and [name] [name] has had a restraining order against him, which she was going to renew today.   Past Medical History  Diagnosis Date  . Alcoholic cirrhosis   . COPD (chronic obstructive pulmonary disease)   . Heart palpitations   . cervical Cancer   . Anxiety   . Portal hypertension   . Esophageal varices   . Cholelithiasis   . Splenomegaly   . Fatty liver   . Esophagitis 2010  . Tubulovillous adenoma of colon   . Visual hallucination     since 09/2010/notes 10/30/2012  . Hepatitis C     chronic hepatitis C and Steatohepatitis (hep grade 2, stage 2-3) per 05/13/08 liver biopsy   Current Outpatient Prescriptions  Medication Sig Dispense Refill  . albuterol (PROVENTIL HFA;VENTOLIN HFA) 108 (90 BASE) MCG/ACT inhaler Inhale 4 puffs into the lungs daily.      . Ipratropium-Albuterol (COMBIVENT) 20-100 MCG/ACT AERS respimat Inhale 1 puff into the lungs every 6 (six) hours as needed for wheezing.  1 Inhaler  11  . lactulose (CHRONULAC) 10 GM/15ML solution Take 45 mLs (30 g total) by mouth daily.  240 mL  2  . LORazepam (ATIVAN) 1 MG tablet Take 0.5 tablets (0.5 mg total) by mouth every 8 (eight) hours as needed for anxiety.  45 tablet  1  . meloxicam (MOBIC) 15 MG tablet TAKE ONE TABLET BY MOUTH EVERY 12 HOURS  30 tablet  3  . pantoprazole (PROTONIX) 40 MG tablet Take 1 tablet (40 mg total) by mouth daily.  30 tablet  11  . potassium chloride SA (K-DUR,KLOR-CON) 20 MEQ tablet Take 1 tablet (20 mEq total) by mouth daily.  30 tablet  2  . traMADol (ULTRAM) 50 MG tablet Take 1 tablet (50 mg total) by mouth  every 8 (eight) hours as needed. For pain.  60 tablet  3  . ziprasidone (GEODON) 40 MG capsule Take 1 capsule (40 mg total) by mouth 2 (two) times daily with a meal.  60 capsule  3  . methocarbamol (ROBAXIN) 500 MG tablet Take 1 tablet (500 mg total) by mouth 2 (two) times daily.  6 tablet  0   No current facility-administered medications for this visit.   Family History  Problem Relation Age of Onset  . Cervical cancer Sister   . Breast cancer Maternal Aunt   . Heart disease Father 29    died from MI  . Cervical cancer Daughter    History   Social History  . Marital Status: Married    Spouse Name: N/A    Number of Children: N/A  . Years of Education: N/A   Social History Main Topics  . Smoking status: Former Smoker -- 0.10 packs/day for 40 years    Types: Cigarettes    Quit date: 10/16/2011  . Smokeless tobacco: Never Used     Comment: smoked about a week ago when she got upset-brother still smokes  . Alcohol Use: No     Comment: months since last drink  . Drug Use: No  . Sexually Active: None   Other Topics Concern  . None   Social History Narrative  . None   Review of Systems: A 10 point ROS was performed; pertinent positives and negatives were noted in the HPI   Objective:  Physical Exam: Filed Vitals:   01/28/13 0832  BP: 127/75  Pulse: 67  Temp: 97.7 F (36.5 C)  TempSrc: Oral  Height: 5\' 2"  (1.575 m)  Weight: 167 lb 6.4 oz (75.932 kg)  SpO2: 99%   Constitutional: Vital signs reviewed.  Patient is a well-developed and well-nourished female in no acute distress and cooperative with exam.  Head: Normocephalic and atraumatic Mouth: Poor dentition Eyes: PERRL, EOMI, conjunctivae normal, +scleral icterus.  Neck: Supple, trachea midline.  Cardiovascular: RRR, S1 normal, S2 normal, no MRG, pulses symmetric and intact bilaterally Pulmonary/Chest: CTAB, no wheezes, rales, or rhonchi Abdominal: TTP in RLQ into the right groin with swelling Neurological: A&O  x3, nonfocal Skin: Warm, dry and intact. No rash, cyanosis, or clubbing.  Psychiatric: Appears more agitated today and is tearful at times but is redirectable; speech is much clearer than on previous visits.  Assessment & Plan:   Please refer to Problem List based Assessment and Plan

## 2013-01-28 NOTE — Patient Instructions (Signed)
An ultrasound of you abdomen has been ordered for your right lower abdominal pain.   I am trying to get you in with  [facility]. In the mean time, please continue to go to [facility]. We will continue the Geodon that was started by [facility]. I will need to get medical records from them.   I will also place a Social Work consult to have someone come out to evaluate your housing situation.

## 2013-02-01 ENCOUNTER — Telehealth: Payer: Self-pay | Admitting: Licensed Clinical Social Worker

## 2013-02-01 NOTE — Assessment & Plan Note (Signed)
Pt seeing Dr. Wynn Banker with Pain Management, who believes that she would possibly benefit from injections. According to his last note from 01/22/13, she was requesting narcotic analgesic pain medications; however her opioid risk total score was high at 11, so will continue with the Tramadol for now. She is to begin PT on 4/8 and will f/u with Dr. Wynn Banker on 4/25.

## 2013-02-01 NOTE — Telephone Encounter (Signed)
[name] was referred to CSW for assistance with landlord/tenant issues.  Pt states she has bugs in her home that "pop her in the face while watching TV".  Pt states they are not cockroaches or fleas, possibly mites,  dust mites, bed bugs.  Pt states she has yet to speak with landlord but has spoken to an exterminator.  CSW provided [name] with the phone number to [facility] as they assist with Landlord/Tenant disputes.  Pt also inquiring about discount programs for land lines.  CSW will inquire of land line discounts, pt aware of the cell phone opportunity as she has one but states she runs out of minutes.  CSW called [name] and provided information about [employer] that offers either wireless or home phone discount.  Informed [name] she can receive one or the other, not both and provided phone number to [employer] Life [employer] program.

## 2013-02-01 NOTE — Assessment & Plan Note (Signed)
I am continuing to wean her Ativan, which appears to be causing increased anxiety and quite honestly some anger in Mrs. [name] [name]. She is currently on 0.5mg  po TID PRN, and for not, I told her I would keep her dose/frequency there. She does appear to be the "clearest" mentally that she has been since I have been seeing her. She was prescribed Geodon, which she believes does help somewhat, esp with her sleep. However, this medication is expensive, and she is unable to afford it without insurance, even with the [employer]. She is to follow up at [facility] sometime soon, but I am trying to get her in to [facility], as noted in the HPI.

## 2013-02-01 NOTE — Assessment & Plan Note (Signed)
GYN and Pap smear scheduled for 02/17/13 at [facility]

## 2013-02-01 NOTE — Assessment & Plan Note (Signed)
She is to f/u at the Hep C clinic in [facility] on 01/28/13.

## 2013-02-01 NOTE — Assessment & Plan Note (Signed)
RLQ abdominal and swelling. This does not appear to be a hernia. She is due for her pelvic exam and pap smear which is scheduled at [facility] for 02/17/13. I have ordered an abdominal ultrasound in the mean time to assess this swelling and pain.  - Abdominal u/s

## 2013-02-02 ENCOUNTER — Ambulatory Visit
Payer: No Typology Code available for payment source | Attending: Physical Medicine & Rehabilitation | Admitting: Physical Therapy

## 2013-02-02 ENCOUNTER — Telehealth: Payer: Self-pay | Admitting: *Deleted

## 2013-02-02 DIAGNOSIS — IMO0001 Reserved for inherently not codable concepts without codable children: Secondary | ICD-10-CM | POA: Insufficient documentation

## 2013-02-02 DIAGNOSIS — M545 Low back pain, unspecified: Secondary | ICD-10-CM | POA: Insufficient documentation

## 2013-02-02 DIAGNOSIS — M546 Pain in thoracic spine: Secondary | ICD-10-CM | POA: Insufficient documentation

## 2013-02-02 NOTE — Progress Notes (Signed)
Message left on home phone ID recording [facility] [place] 02/04/13 9AM - NPO after . Stanton Kidney Abdi Husak RN 02/02/13 9AM.

## 2013-02-03 NOTE — Telephone Encounter (Signed)
Pt will obtain her geodon at [facility]

## 2013-02-04 ENCOUNTER — Ambulatory Visit (HOSPITAL_COMMUNITY)
Admission: RE | Admit: 2013-02-04 | Discharge: 2013-02-04 | Disposition: A | Discharge status: Home / Self Care | Payer: No Typology Code available for payment source | Source: Ambulatory Visit | Attending: Internal Medicine | Admitting: Internal Medicine

## 2013-02-04 DIAGNOSIS — Z8619 Personal history of other infectious and parasitic diseases: Secondary | ICD-10-CM | POA: Insufficient documentation

## 2013-02-04 DIAGNOSIS — R109 Unspecified abdominal pain: Secondary | ICD-10-CM

## 2013-02-04 DIAGNOSIS — R1011 Right upper quadrant pain: Secondary | ICD-10-CM | POA: Insufficient documentation

## 2013-02-04 DIAGNOSIS — R161 Splenomegaly, not elsewhere classified: Secondary | ICD-10-CM | POA: Insufficient documentation

## 2013-02-04 DIAGNOSIS — K8020 Calculus of gallbladder without cholecystitis without obstruction: Secondary | ICD-10-CM | POA: Insufficient documentation

## 2013-02-04 DIAGNOSIS — K7460 Unspecified cirrhosis of liver: Secondary | ICD-10-CM | POA: Insufficient documentation

## 2013-02-08 ENCOUNTER — Ambulatory Visit (HOSPITAL_COMMUNITY)
Admission: RE | Admit: 2013-02-08 | Discharge: 2013-02-08 | Disposition: A | Discharge status: Home / Self Care | Payer: Self-pay | Source: Ambulatory Visit | Attending: Internal Medicine | Admitting: Internal Medicine

## 2013-02-08 DIAGNOSIS — Z1231 Encounter for screening mammogram for malignant neoplasm of breast: Secondary | ICD-10-CM | POA: Insufficient documentation

## 2013-02-08 NOTE — Progress Notes (Signed)
INTERNAL MEDICINE TEACHING ATTENDING ADDENDUM - Inez Catalina, MD: I reviewed with the resident Dr. Sherrine Maples, [name]'  medical history, physical examination, diagnosis and results of tests and treatment and I agree with the patient's care as documented.

## 2013-02-09 ENCOUNTER — Other Ambulatory Visit: Payer: Self-pay | Admitting: Internal Medicine

## 2013-02-09 DIAGNOSIS — R928 Other abnormal and inconclusive findings on diagnostic imaging of breast: Secondary | ICD-10-CM

## 2013-02-11 ENCOUNTER — Telehealth (HOSPITAL_COMMUNITY): Payer: Self-pay | Admitting: *Deleted

## 2013-02-11 NOTE — Telephone Encounter (Signed)
Telephoned daughter's # [phone] and left message to return call to [facility]

## 2013-02-15 ENCOUNTER — Encounter: Payer: Self-pay | Admitting: *Deleted

## 2013-02-17 ENCOUNTER — Encounter: Payer: Self-pay | Admitting: Internal Medicine

## 2013-02-17 ENCOUNTER — Ambulatory Visit (INDEPENDENT_AMBULATORY_CARE_PROVIDER_SITE_OTHER): Payer: No Typology Code available for payment source | Admitting: Obstetrics & Gynecology

## 2013-02-17 ENCOUNTER — Encounter: Payer: Self-pay | Admitting: Obstetrics & Gynecology

## 2013-02-17 VITALS — BP 127/89 | HR 74 | Temp 97.1°F | Ht 62.0 in | Wt 172.0 lb

## 2013-02-17 DIAGNOSIS — R6889 Other general symptoms and signs: Secondary | ICD-10-CM

## 2013-02-17 DIAGNOSIS — Z8541 Personal history of malignant neoplasm of cervix uteri: Secondary | ICD-10-CM

## 2013-02-17 DIAGNOSIS — IMO0002 Reserved for concepts with insufficient information to code with codable children: Secondary | ICD-10-CM

## 2013-02-17 NOTE — Progress Notes (Signed)
  Subjective:    [name], female    DOB: 1961/08/22, 52 y.o.   MRN: [id number]  HPI  52 yo S W P2 who gives a h/o cervical cancer s/p hysterectomy in 2000 (she thinks that her ovaries are in situ). She says that she is here for a pap smear. She reports that she recently had a breast exam and I noted that she had an incomplete mammogram this month. She goes for a follow up next week. She is not sexually active.  Review of Systems She has Hep c and alcoholic cirhossis.    Objective:   Physical Exam  Moderate vulvar/vaginal atrophy, otherwise normal cuff She has a yeast rash of groins (She says that she already has a prescription at the pharmacy and that she believes that her mother will be buying it for her soon) No pelvic masses     Assessment & Plan:  H/o LGSIL pap last year. I repeated a pap today with HPV cotesting. She will RTC in 2 weeks for a colposcopy.

## 2013-02-18 ENCOUNTER — Ambulatory Visit: Payer: Self-pay | Admitting: Internal Medicine

## 2013-02-18 DIAGNOSIS — K7030 Alcoholic cirrhosis of liver without ascites: Secondary | ICD-10-CM | POA: Insufficient documentation

## 2013-02-19 ENCOUNTER — Ambulatory Visit: Payer: No Typology Code available for payment source | Admitting: Physical Medicine & Rehabilitation

## 2013-02-19 ENCOUNTER — Encounter: Payer: No Typology Code available for payment source | Attending: Physical Medicine & Rehabilitation

## 2013-02-22 ENCOUNTER — Ambulatory Visit: Payer: Self-pay | Admitting: Internal Medicine

## 2013-02-22 ENCOUNTER — Encounter: Payer: Self-pay | Admitting: Licensed Clinical Social Worker

## 2013-02-22 NOTE — Progress Notes (Signed)
[name], female   DOB: [birth year], 52 y.o.   MRN: [id number] [facility] waiting to receive chart information from [facility].  Request submitted April 2014.  Once [facility] receives [place] documentation will refer to [facility] for potential appt.  CSW confirmed with [facility], as they still have pt on potential listing until the review records.

## 2013-02-23 ENCOUNTER — Ambulatory Visit: Payer: No Typology Code available for payment source | Admitting: Physical Therapy

## 2013-02-24 DIAGNOSIS — F411 Generalized anxiety disorder: Secondary | ICD-10-CM | POA: Insufficient documentation

## 2013-02-25 ENCOUNTER — Ambulatory Visit (INDEPENDENT_AMBULATORY_CARE_PROVIDER_SITE_OTHER): Payer: No Typology Code available for payment source | Admitting: Internal Medicine

## 2013-02-25 ENCOUNTER — Encounter: Payer: Self-pay | Admitting: Internal Medicine

## 2013-02-25 ENCOUNTER — Ambulatory Visit: Payer: No Typology Code available for payment source | Admitting: Physical Therapy

## 2013-02-25 ENCOUNTER — Ambulatory Visit: Payer: Self-pay | Admitting: Internal Medicine

## 2013-02-25 VITALS — BP 165/96 | HR 66 | Temp 97.1°F | Ht 61.75 in | Wt 176.0 lb

## 2013-02-25 DIAGNOSIS — F411 Generalized anxiety disorder: Secondary | ICD-10-CM

## 2013-02-25 DIAGNOSIS — G8929 Other chronic pain: Secondary | ICD-10-CM

## 2013-02-25 DIAGNOSIS — Z0000 Encounter for general adult medical examination without abnormal findings: Secondary | ICD-10-CM

## 2013-02-25 DIAGNOSIS — B182 Chronic viral hepatitis C: Secondary | ICD-10-CM

## 2013-02-25 DIAGNOSIS — R441 Visual hallucinations: Secondary | ICD-10-CM

## 2013-02-25 DIAGNOSIS — H5316 Psychophysical visual disturbances: Secondary | ICD-10-CM

## 2013-02-25 DIAGNOSIS — F419 Anxiety disorder, unspecified: Secondary | ICD-10-CM

## 2013-02-25 DIAGNOSIS — K7460 Unspecified cirrhosis of liver: Secondary | ICD-10-CM

## 2013-02-25 NOTE — Progress Notes (Signed)
[name], female   DOB: 14-Sep-1961, 52 y.o.   MRN: [id number]  Subjective:   [name] female   DOB: 05-Nov-1960 52 y.o.   MRN: [id number]  HPI: [name] is a 52 y.o. female with PMH liver cirrhosis from chronic hep C and alcohol abuse, significant anxiety, and a h/o visual hallucinations, presents to the clinic for a routine follow up appointment.  She states that she is doing well. She was recently seen at the [facility] hepatitis C clinic. She saw Dr. Joetta [name], who felt he could treat her hepatitis C, but did not feel that her mental health was stable enough at this time. She's to follow up with him in 12 weeks.  She continues to be seen at [facility], and is receiving Geodon 40 mg by mouth twice a day. This does seem to have further improve her mood, affect, and speech. Shortly receiving the medication through [facility], but is asking about medication assistance.  She also states she was seen recently [facility] for her Pap smear pelvic exam. She states she's concerned that she is "shrinking". She feels like she is shorter than she used to be.   Past Medical History  Diagnosis Date  . Alcoholic cirrhosis   . COPD (chronic obstructive pulmonary disease)   . Heart palpitations   . Anxiety   . Portal hypertension   . Esophageal varices   . Cholelithiasis   . Splenomegaly   . Fatty liver   . Esophagitis 2010  . Tubulovillous adenoma of colon   . Visual hallucination     since 09/2010/notes 10/30/2012  . Hepatitis C     chronic hepatitis C and Steatohepatitis (hep grade 2, stage 2-3) per 05/13/08 liver biopsy  . cervical Cancer   . Obesity    Current Outpatient Prescriptions  Medication Sig Dispense Refill  . albuterol (PROVENTIL HFA;VENTOLIN HFA) 108 (90 BASE) MCG/ACT inhaler Inhale 4 puffs into the lungs daily.      . Ipratropium-Albuterol (COMBIVENT) 20-100 MCG/ACT AERS respimat Inhale 1 puff into the lungs every 6  (six) hours as needed for wheezing.  1 Inhaler  11  . lactulose (CHRONULAC) 10 GM/15ML solution Take 45 mLs (30 g total) by mouth daily.  240 mL  2  . LORazepam (ATIVAN) 1 MG tablet Take 0.5 tablets (0.5 mg total) by mouth every 8 (eight) hours as needed for anxiety.  45 tablet  1  . meloxicam (MOBIC) 15 MG tablet TAKE ONE TABLET BY MOUTH EVERY 12 HOURS  30 tablet  3  . methocarbamol (ROBAXIN) 500 MG tablet Take 1 tablet (500 mg total) by mouth 2 (two) times daily.  6 tablet  0  . pantoprazole (PROTONIX) 40 MG tablet Take 1 tablet (40 mg total) by mouth daily.  30 tablet  11  . propranolol (INDERAL) 20 MG tablet Take 20 mg by mouth daily.      . traMADol (ULTRAM) 50 MG tablet Take 1 tablet (50 mg total) by mouth every 8 (eight) hours as needed. For pain.  60 tablet  3  . ziprasidone (GEODON) 40 MG capsule Take 1 capsule (40 mg total) by mouth 2 (two) times daily with a meal.  60 capsule  3   No current facility-administered medications for this visit.   Family History  Problem Relation Age of Onset  . Cervical cancer Sister   . Breast cancer Maternal Aunt   . Heart disease Father 59    died  from MI  . Cervical cancer Daughter    History   Social History  . Marital Status: Married    Spouse Name: N/A    Number of Children: N/A  . Years of Education: N/A   Social History Main Topics  . Smoking status: Former Smoker -- 0.10 packs/day for 40 years    Types: Cigarettes    Quit date: 10/16/2011  . Smokeless tobacco: Never Used     Comment: smoked about a week ago when she got upset-brother still smokes  . Alcohol Use: No     Comment: months since last drink  . Drug Use: No  . Sexually Active: No   Other Topics Concern  . None   Social History Narrative  . None   Review of Systems: A 10 point ROS was performed; pertinent positives and negatives were noted in the HPI   Objective:  Physical Exam: Filed Vitals:   02/25/13 1526  BP: 165/96  Pulse: 66  Temp: 97.1 F (36.2  C)  TempSrc: Oral  Height: 5' 1.75" (1.568 m)  Weight: 176 lb (79.833 kg)  SpO2: 94%   Constitutional: Vital signs reviewed.  Patient is a well-developed and well-nourished female in no acute distress and cooperative with exam. Head: Normocephalic and atraumatic Eyes: PERRL, EOMI, conjunctivae normal Neck: Supple, Trachea midline  Cardiovascular: RRR, S1 normal, S2 normal, no MRG, pulses symmetric and intact bilaterally Pulmonary/Chest: CTAB, no wheezes, rales, or rhonchi Abdominal: Soft. Non-tender, non-distended Musculoskeletal: No joint deformities Neurological: A&O x3, non-focal Skin: Warm, dry and intact. No rash, cyanosis, or clubbing.  Psychiatric: Normal mood and affect. Speech and behavior appear normal- greatly improved from when she 1st came to the clinic  Assessment & Plan:   Please refer to Problem List based Assessment and Plan

## 2013-02-25 NOTE — Patient Instructions (Addendum)
**  You are doing great!!! Keep up the great work!!!  Please ask for your medical records from [place] when you have your next appointment. We have tried to get these records to send to [facility] folks, but Vesta Mixer has yet to send them.  I have put an order in for a DEXA scan so we can evaluate your bones.

## 2013-02-27 NOTE — Assessment & Plan Note (Signed)
Pt feels like she has "shrunk". She states that she used to be taller than her younger sister, but her sister is now taller than she is. She was 5\' 3"  9/13 and is 5' 1.75" at this clinic visit. She is 51yo, so will obtain DEXA scan for possible osteoporosis. - DEXA order placed

## 2013-02-27 NOTE — Assessment & Plan Note (Addendum)
She was seen by Dr. Joetta Manners at the hepatitis C clinic in [facility]. Do not have medical records yet, but per patient, Dr. Joetta Manners that he did have medication to treat her hepatitis C, but he would not treat her at this time because he did not feel that her mental health was stable enough at that time. She is to f/u in 12 weeks. If we do not get her records, will sign release for them.

## 2013-02-27 NOTE — Assessment & Plan Note (Signed)
Per patient, these have resolved over the past month since beginning the Geodon. Will continue Geodon 40 mg by mouth twice a day, provided [facility].

## 2013-02-27 NOTE — Assessment & Plan Note (Addendum)
I am keeping her Ativan at 0.5mg  po TID. She states that she is using the medicaiton more than prescribed. I advised against her taking the Ativan more frequently than prescribed, and told her that if she took the medication more frequently than prescribed, I would not be refilling the Ativan early. She acknowledged understanding. Since decreasing her Ativan dose from when I first met her in September of 2013, when she was taking 2 mg by mouth q. 8 hours, she has been doing much better. She's more alert, her speech is clearer, her affect has improved, and she appears more steady on her feet. She has also been seen at [facility]. She was started on Geodon 40 mg by mouth twice a day about one month ago, and this has also help further improve her overall mood and affect. I'm still trying to get her seen by [facility], has a history of benefit more being seen by them. Patient states she would like to be seen at [facility] - Need [facility] records; have already sent medical record request

## 2013-03-02 ENCOUNTER — Encounter (HOSPITAL_COMMUNITY): Payer: Self-pay

## 2013-03-02 ENCOUNTER — Ambulatory Visit (HOSPITAL_COMMUNITY)
Admission: RE | Admit: 2013-03-02 | Discharge: 2013-03-02 | Disposition: A | Discharge status: Home / Self Care | Payer: No Typology Code available for payment source | Source: Ambulatory Visit | Attending: Obstetrics and Gynecology | Admitting: Obstetrics and Gynecology

## 2013-03-02 ENCOUNTER — Other Ambulatory Visit: Payer: Self-pay | Admitting: *Deleted

## 2013-03-02 VITALS — BP 132/86 | Temp 97.9°F | Ht 61.75 in | Wt 179.6 lb

## 2013-03-02 DIAGNOSIS — Z1239 Encounter for other screening for malignant neoplasm of breast: Secondary | ICD-10-CM

## 2013-03-02 DIAGNOSIS — N6325 Unspecified lump in the left breast, overlapping quadrants: Secondary | ICD-10-CM | POA: Insufficient documentation

## 2013-03-02 DIAGNOSIS — F419 Anxiety disorder, unspecified: Secondary | ICD-10-CM

## 2013-03-02 HISTORY — DX: Major depressive disorder, single episode, unspecified: F32.9

## 2013-03-02 HISTORY — DX: Depression, unspecified: F32.A

## 2013-03-02 NOTE — Progress Notes (Addendum)
Complaints of left breast lump x 1 month that is painful at times. Patient rated pain at a 9 out of 10.  Pap Smear:    Pap smear not completed today. Last Pap smear was 02/17/2013 at the [facility]. Patient referred to the [facility] for colpscopy. Appointment scheduled for Wednesday, April 07, 2013. Patient has a history of cervical cancer in 1999 that required a hysterectomy in 2000 for treatment. Pap smear result above in EPIC.  Physical exam: Breasts Breasts symmetrical. No skin abnormalities bilateral breasts. No nipple retraction bilateral breasts. No nipple discharge bilateral breasts. No lymphadenopathy. No lumps palpated right breast. Palpated lump within left breast at 3 o'clock 7 cm from the nipple. Patient complained of tenderness when palpated lump. Referred patient to the [facility] for left breast diagnostic mammogram and possible ultrasound per recommendation. Appointment scheduled for Wednesday, Mar 03, 2013 at 1040.  Pelvic/Bimanual No Pap smear completed today since last Pap smear was 02/17/2013. Pap smear not indicated per BCCCP guidelines.

## 2013-03-02 NOTE — Patient Instructions (Signed)
Taught patient how to perform BSE. Patient did not need a Pap smear today due to last Pap smear was 02/17/2013. Patient scheduled for a colposcopy at the [facility] on Wednesday, April 07, 2013 at 1315 due to abnormal Pap smear. Referred patient to the [facility] for left breast diagnostic mammogram and possible ultrasound per recommendation. Appointment scheduled for Wednesday, Mar 03, 2013 at 1040. Patient aware of appointments and will be there. Patient verbalized understanding.

## 2013-03-02 NOTE — Progress Notes (Signed)
Pt aware bone density sch 03/08/13 10:15AM WH. Stanton Kidney Jury Caserta RN 03/02/13 1:30PM

## 2013-03-02 NOTE — Addendum Note (Signed)
Encounter addended by: Saintclair Halsted, RN on: 03/02/2013  3:00 PM<BR>     Documentation filed: Notes Section

## 2013-03-03 ENCOUNTER — Telehealth: Payer: Self-pay | Admitting: *Deleted

## 2013-03-03 ENCOUNTER — Ambulatory Visit
Admission: RE | Admit: 2013-03-03 | Discharge: 2013-03-03 | Disposition: A | Discharge status: Home / Self Care | Payer: No Typology Code available for payment source | Source: Ambulatory Visit | Attending: Internal Medicine | Admitting: Internal Medicine

## 2013-03-03 DIAGNOSIS — R928 Other abnormal and inconclusive findings on diagnostic imaging of breast: Secondary | ICD-10-CM

## 2013-03-03 NOTE — Progress Notes (Signed)
Case discussed with Dr. Glenn at the time of the visit. We reviewed the resident's history and exam and pertinent patient test results. I agree with the assessment, diagnosis and plan of care documented in the resident's note. 

## 2013-03-03 NOTE — Telephone Encounter (Signed)
Call from pt - requesting Geodon rx called to [facility] pharmacy. Talked to Lyman at [facility]) - verbal order given (rx written 01/28/13). Pt informed.

## 2013-03-04 NOTE — Telephone Encounter (Signed)
I thought [facility] was filling the Geodon for her. If not, I can prescribe it. Please call her to verify.

## 2013-03-08 ENCOUNTER — Ambulatory Visit (HOSPITAL_COMMUNITY): Admission: RE | Admit: 2013-03-08 | Payer: No Typology Code available for payment source | Source: Ambulatory Visit

## 2013-03-09 ENCOUNTER — Encounter (HOSPITAL_COMMUNITY): Payer: Self-pay | Admitting: *Deleted

## 2013-03-09 ENCOUNTER — Emergency Department (HOSPITAL_COMMUNITY)
Admission: EM | Admit: 2013-03-09 | Discharge: 2013-03-09 | Disposition: A | Discharge status: Home / Self Care | Payer: No Typology Code available for payment source | Attending: Emergency Medicine | Admitting: Emergency Medicine

## 2013-03-09 ENCOUNTER — Emergency Department (HOSPITAL_COMMUNITY): Payer: No Typology Code available for payment source

## 2013-03-09 ENCOUNTER — Telehealth: Payer: Self-pay | Admitting: *Deleted

## 2013-03-09 ENCOUNTER — Other Ambulatory Visit: Payer: Self-pay

## 2013-03-09 ENCOUNTER — Ambulatory Visit
Payer: No Typology Code available for payment source | Attending: Physical Medicine & Rehabilitation | Admitting: Physical Therapy

## 2013-03-09 DIAGNOSIS — R059 Cough, unspecified: Secondary | ICD-10-CM | POA: Insufficient documentation

## 2013-03-09 DIAGNOSIS — IMO0001 Reserved for inherently not codable concepts without codable children: Secondary | ICD-10-CM | POA: Insufficient documentation

## 2013-03-09 DIAGNOSIS — Z8619 Personal history of other infectious and parasitic diseases: Secondary | ICD-10-CM | POA: Insufficient documentation

## 2013-03-09 DIAGNOSIS — F411 Generalized anxiety disorder: Secondary | ICD-10-CM | POA: Insufficient documentation

## 2013-03-09 DIAGNOSIS — F3289 Other specified depressive episodes: Secondary | ICD-10-CM | POA: Insufficient documentation

## 2013-03-09 DIAGNOSIS — E669 Obesity, unspecified: Secondary | ICD-10-CM | POA: Insufficient documentation

## 2013-03-09 DIAGNOSIS — M545 Low back pain, unspecified: Secondary | ICD-10-CM | POA: Insufficient documentation

## 2013-03-09 DIAGNOSIS — F329 Major depressive disorder, single episode, unspecified: Secondary | ICD-10-CM | POA: Insufficient documentation

## 2013-03-09 DIAGNOSIS — M546 Pain in thoracic spine: Secondary | ICD-10-CM | POA: Insufficient documentation

## 2013-03-09 DIAGNOSIS — R0609 Other forms of dyspnea: Secondary | ICD-10-CM | POA: Insufficient documentation

## 2013-03-09 DIAGNOSIS — Z87891 Personal history of nicotine dependence: Secondary | ICD-10-CM | POA: Insufficient documentation

## 2013-03-09 DIAGNOSIS — R0989 Other specified symptoms and signs involving the circulatory and respiratory systems: Secondary | ICD-10-CM | POA: Insufficient documentation

## 2013-03-09 DIAGNOSIS — Z8719 Personal history of other diseases of the digestive system: Secondary | ICD-10-CM | POA: Insufficient documentation

## 2013-03-09 DIAGNOSIS — R05 Cough: Secondary | ICD-10-CM | POA: Insufficient documentation

## 2013-03-09 DIAGNOSIS — R062 Wheezing: Secondary | ICD-10-CM | POA: Insufficient documentation

## 2013-03-09 DIAGNOSIS — Z79899 Other long term (current) drug therapy: Secondary | ICD-10-CM | POA: Insufficient documentation

## 2013-03-09 DIAGNOSIS — J4489 Other specified chronic obstructive pulmonary disease: Secondary | ICD-10-CM | POA: Insufficient documentation

## 2013-03-09 DIAGNOSIS — Z8541 Personal history of malignant neoplasm of cervix uteri: Secondary | ICD-10-CM | POA: Insufficient documentation

## 2013-03-09 DIAGNOSIS — J449 Chronic obstructive pulmonary disease, unspecified: Secondary | ICD-10-CM | POA: Insufficient documentation

## 2013-03-09 DIAGNOSIS — Z8679 Personal history of other diseases of the circulatory system: Secondary | ICD-10-CM | POA: Insufficient documentation

## 2013-03-09 DIAGNOSIS — R0600 Dyspnea, unspecified: Secondary | ICD-10-CM

## 2013-03-09 LAB — HEPATIC FUNCTION PANEL
ALT: 81 U/L — ABNORMAL HIGH (ref 0–35)
Alkaline Phosphatase: 226 U/L — ABNORMAL HIGH (ref 39–117)
Indirect Bilirubin: 1 mg/dL — ABNORMAL HIGH (ref 0.3–0.9)
Total Bilirubin: 1.5 mg/dL — ABNORMAL HIGH (ref 0.3–1.2)

## 2013-03-09 LAB — BASIC METABOLIC PANEL
CO2: 22 mEq/L (ref 19–32)
Calcium: 8.6 mg/dL (ref 8.4–10.5)
GFR calc Af Amer: >90 mL/min (ref >90)
GFR calc non Af Amer: >90 mL/min (ref >90)
Sodium: 140 mEq/L (ref 135–145)

## 2013-03-09 LAB — CBC
MCV: 99.7 fL (ref 78.0–100.0)
Platelets: 135 10*3/uL — ABNORMAL LOW (ref 150–400)
RBC: 3.96 MIL/uL (ref 3.87–5.11)
WBC: 9.2 10*3/uL (ref 4.0–10.5)

## 2013-03-09 LAB — POCT I-STAT TROPONIN I: Troponin i, poc: 0 ng/mL (ref 0.00–0.08)

## 2013-03-09 MED ORDER — DEXAMETHASONE 2 MG PO TABS: ORAL_TABLET | ORAL | Status: DC

## 2013-03-09 MED ORDER — RACEPINEPHRINE HCL 2.25 % IN NEBU
0.5000 mL | INHALATION_SOLUTION | Freq: Once | RESPIRATORY_TRACT | Status: AC
  Administered 2013-03-09: 0.5 mL via RESPIRATORY_TRACT
  Filled 2013-03-09: qty 0.5

## 2013-03-09 MED ORDER — IPRATROPIUM BROMIDE 0.02 % IN SOLN
0.5000 mg | Freq: Once | RESPIRATORY_TRACT | Status: AC
  Administered 2013-03-09: 0.5 mg via RESPIRATORY_TRACT
  Filled 2013-03-09: qty 2.5

## 2013-03-09 MED ORDER — DEXAMETHASONE SODIUM PHOSPHATE 10 MG/ML IJ SOLN
10.0000 mg | Freq: Once | INTRAMUSCULAR | Status: AC
  Administered 2013-03-09: 10 mg via INTRAVENOUS
  Filled 2013-03-09: qty 1

## 2013-03-09 MED ORDER — ALBUTEROL SULFATE (5 MG/ML) 0.5% IN NEBU
5.0000 mg | INHALATION_SOLUTION | Freq: Once | RESPIRATORY_TRACT | Status: AC
  Administered 2013-03-09: 5 mg via RESPIRATORY_TRACT
  Filled 2013-03-09: qty 1

## 2013-03-09 NOTE — ED Notes (Signed)
MD at bedside. 

## 2013-03-09 NOTE — Telephone Encounter (Signed)
Pt seen in ED last night, given breathing treatments, and prednisone. She is to f/u in clinic.    Will see tomorrow.

## 2013-03-09 NOTE — ED Provider Notes (Signed)
History     CSN: [id number]  Arrival date & time 03/09/13  0245   First MD Initiated Contact with Patient 03/09/13 [phone]      Chief Complaint  Patient presents with  . Shortness of Breath    (Consider location/radiation/quality/duration/timing/severity/associated sxs/prior treatment) HPI This is a 52 year old female with a history of COPD. She is here with 2 days of worsening shortness of breath. She states it became acutely worse yesterday afternoon when she attempted to take a walk. She states her dyspnea became severe and she had to sit down on the side of the road. A neighbor came by and drove her home. She continued to have dyspnea at rest despite use of her Combivent inhaler. When attempting to lie down to sleep this again worsened her breathing and she called EMS this morning. She states she has been wheezing and coughing up phlegm with this. She denies chest pain, nausea or vomiting.  Past Medical History  Diagnosis Date  . Alcoholic cirrhosis   . COPD (chronic obstructive pulmonary disease)   . Heart palpitations   . Anxiety   . Portal hypertension   . Esophageal varices   . Cholelithiasis   . Splenomegaly   . Fatty liver   . Esophagitis 2010  . Tubulovillous adenoma of colon   . Visual hallucination     since 09/2010/notes 10/30/2012  . Hepatitis C     chronic hepatitis C and Steatohepatitis (hep grade 2, stage 2-3) per 05/13/08 liver biopsy  . Obesity   . cervical Cancer 1999  . Depression     Past Surgical History  Procedure Laterality Date  . Cervical cancer surgery    . Abdominal hysterectomy    . Tubal ligation    . Esophagogastroduodenoscopy  10/18/2011    Procedure: ESOPHAGOGASTRODUODENOSCOPY (EGD);  Surgeon: Rob Bunting, MD;  Location: [facility];  Service: Endoscopy;  Laterality: N/A;  . Hysterectomy      Family History  Problem Relation Age of Onset  . Cervical cancer Sister   . Breast cancer Maternal Aunt   . Heart disease Father 32    died  from MI  . Breast cancer Father   . Cervical cancer Daughter     History  Substance Use Topics  . Smoking status: Former Smoker -- 0.10 packs/day for 40 years    Types: Cigarettes    Quit date: 10/16/2011  . Smokeless tobacco: Never Used     Comment: smoked about a week ago when she got upset-brother still smokes  . Alcohol Use: No     Comment: months since last drink    OB History   Grav Para Term Preterm Abortions TAB SAB Ect Mult Living   3 2   1  1   2       Review of Systems  All other systems reviewed and are negative.    Allergies  Amitriptyline  Home Medications   Current Outpatient Rx  Name  Route  Sig  Dispense  Refill  . albuterol (PROVENTIL HFA;VENTOLIN HFA) 108 (90 BASE) MCG/ACT inhaler   Inhalation   Inhale 4 puffs into the lungs daily.         . Ipratropium-Albuterol (COMBIVENT) 20-100 MCG/ACT AERS respimat   Inhalation   Inhale 1 puff into the lungs every 6 (six) hours as needed for wheezing.   1 Inhaler   11   . lactulose (CHRONULAC) 10 GM/15ML solution   Oral   Take 45 mLs (30 g total) by mouth  daily.   240 mL   2   . LORazepam (ATIVAN) 1 MG tablet   Oral   Take 0.5 tablets (0.5 mg total) by mouth every 8 (eight) hours as needed for anxiety.   45 tablet   1   . meloxicam (MOBIC) 15 MG tablet      TAKE ONE TABLET BY MOUTH EVERY 12 HOURS   30 tablet   3   . methocarbamol (ROBAXIN) 500 MG tablet   Oral   Take 1 tablet (500 mg total) by mouth 2 (two) times daily.   6 tablet   0   . pantoprazole (PROTONIX) 40 MG tablet   Oral   Take 1 tablet (40 mg total) by mouth daily.   30 tablet   11   . propranolol (INDERAL) 20 MG tablet   Oral   Take 20 mg by mouth daily.         . traMADol (ULTRAM) 50 MG tablet   Oral   Take 1 tablet (50 mg total) by mouth every 8 (eight) hours as needed. For pain.   60 tablet   3   . ziprasidone (GEODON) 40 MG capsule   Oral   Take 1 capsule (40 mg total) by mouth 2 (two) times daily with a  meal.   60 capsule   3     BP 107/70  Pulse 78  Temp(Src) 97.9 F (36.6 C) (Oral)  Resp 18  SpO2 100%  Physical Exam General: Well-developed, well-nourished female in no acute distress; appearance consistent with age of record HENT: normocephalic, atraumatic; edentulous Eyes: pupils equal round and reactive to light; extraocular muscles intact Neck: supple Heart: regular rate and rhythm Lungs: Stridor Abdomen: soft; nondistended; nontender; no masses or hepatosplenomegaly; bowel sounds present Extremities: No deformity; full range of motion; pulses normal Neurologic: Awake, alert and oriented; motor function intact in all extremities and symmetric; no facial droop Skin: Warm and dry Psychiatric: Flat affect    ED Course  Procedures (including critical care time)     MDM   Nursing notes and vitals signs, including pulse oximetry, reviewed.  Summary of this visit's results, reviewed by myself:  Labs:  Results for orders placed during the hospital encounter of 03/09/13 (from the past 24 hour(s))  CBC     Status: Abnormal   Collection Time    03/09/13  3:25 AM      Result Value Range   WBC 9.2  4.0 - 10.5 K/uL   RBC 3.96  3.87 - 5.11 MIL/uL   Hemoglobin 13.8  12.0 - 15.0 g/dL   HCT 16.1  09.6 - 04.5 %   MCV 99.7  78.0 - 100.0 fL   MCH 34.8 (*) 26.0 - 34.0 pg   MCHC 34.9  30.0 - 36.0 g/dL   RDW 40.9  81.1 - 91.4 %   Platelets 135 (*) 150 - 400 K/uL  POCT I-STAT TROPONIN I     Status: None   Collection Time    03/09/13  3:34 AM      Result Value Range   Troponin i, poc 0.00  0.00 - 0.08 ng/mL   Comment 3             Imaging Studies: No results found.    EKG Interpretation:  Date & Time: 03/09/2013 3:10 AM  Rate: 73  Rhythm: normal sinus rhythm  QRS Axis: normal  Intervals: normal  ST/T Wave abnormalities: normal  Conduction Disutrbances:none  Narrative Interpretation: LAH  Old EKG Reviewed: Anterior T waves not as prominent  4:53 AM Patient  feeling better after racemic epi neb. Patient now able to lie on her side without distress.  5:56 AM Patient feeling better, states "I can breathe now!" as she is a sandwich.  Hanley Seamen, MD 03/09/13 [phone]

## 2013-03-09 NOTE — ED Notes (Signed)
Patient transported to X-ray 

## 2013-03-09 NOTE — ED Notes (Signed)
EMS called to home.  Found patient sitting alert and oriented x3.  She is complaining of shortness of breath with a productive cough That started yesterday.  She currently denies pain.

## 2013-03-09 NOTE — ED Notes (Signed)
Dr. Read Drivers turned of O2 to get a RA O2 saturation

## 2013-03-10 ENCOUNTER — Ambulatory Visit (INDEPENDENT_AMBULATORY_CARE_PROVIDER_SITE_OTHER): Payer: No Typology Code available for payment source | Admitting: Internal Medicine

## 2013-03-10 ENCOUNTER — Encounter: Payer: Self-pay | Admitting: Internal Medicine

## 2013-03-10 VITALS — BP 129/79 | HR 69 | Temp 97.1°F | Ht 61.75 in | Wt 177.2 lb

## 2013-03-10 DIAGNOSIS — G8929 Other chronic pain: Secondary | ICD-10-CM

## 2013-03-10 DIAGNOSIS — J449 Chronic obstructive pulmonary disease, unspecified: Secondary | ICD-10-CM

## 2013-03-10 MED ORDER — ALBUTEROL SULFATE HFA 108 (90 BASE) MCG/ACT IN AERS: 4.0000 | INHALATION_SPRAY | Freq: Every day | RESPIRATORY_TRACT | Status: DC

## 2013-03-10 MED ORDER — TRAMADOL HCL 50 MG PO TABS: 50.0000 mg | ORAL_TABLET | Freq: Four times a day (QID) | ORAL | Status: DC | PRN

## 2013-03-10 NOTE — Assessment & Plan Note (Addendum)
Patient requesting increased dose of tramadol. She is getting 60 tablets a month- last prescription was filled on 01/29/2012 with 3 refills. She said, she had a discussion with Dr. Sherrine Maples about increasing the dose of tramadol for 90 tablets. I did not find any notes about that. Will confirm this. Patient says that she is out of her tramadol and wants the extra 30 pills for the month, as her next due is on 03/24/2013. - Prescription for extra 30 pills of tramadol sent to pharmacy.

## 2013-03-10 NOTE — Assessment & Plan Note (Addendum)
Patient with dyspnea- not likely COPD exacerbation. Treated with epinephrine and steroids and felt better. Much improvement in clinical status.  - Continue observation. - As needed clinic visit if status gets worse. - Got PFTs records today-  showed severe airflow obstruction, study was not complete as patient was not able to complete it per standards. - Albuterol inhaler refilled.

## 2013-03-10 NOTE — Patient Instructions (Signed)
Please make a followup appointment as needed.  Your breathing seems to have improved.  If he gets worse again give [place] a call or go to the ED.  Continue using your inhalers as you do.  Keep taking all medications regularly.

## 2013-03-10 NOTE — Addendum Note (Signed)
Addended by: Lyn Hollingshead C on: 03/10/2013 10:02 AM   Modules accepted: Orders

## 2013-03-10 NOTE — Progress Notes (Signed)
  Subjective:    [name], female    DOB: 03-Jun-1961, 52 y.o.   MRN: [id number]  HPI patient is a 50 year woman with history of COPD, cirrhosis and hep C and other problems as per problem list who comes the clinic for ED followup visit. She was seen in it yesterday for dyspnea and was treated with racemic epinephrine and steroids- which improved her breathing and she was sent home with followup in the clinic.  She was not treated as COPD exacerbation - Was not given any antibiotics.  She was given 10 mg of dexamethasone to be taken today.  Overall she feels much better, although not back to baseline. Denies any wheezing, shortness of breath, headache, palpitations, chest pain, abdominal pain, diarrhea.  She wants to get the prescription of tramadol for 90 tablets rather than 60, and says she discussion with Dr. Sherrine Maples during last visit about this.    Review of Systems    As per history of present illness.  Objective:   Physical Exam  General: NAD HEENT: PERRL, EOMI, no scleral icterus Cardiac: S1, S2, RRR, no rubs, murmurs or gallops Pulm: clear to auscultation bilaterally, moving normal volumes of air Abd: soft, nontender, nondistended, BS present Ext: warm and well perfused, no pedal edema Neuro: alert and oriented X3, cranial nerves II-XII grossly intact       Assessment & Plan:

## 2013-03-11 ENCOUNTER — Other Ambulatory Visit: Payer: Self-pay | Admitting: Internal Medicine

## 2013-03-11 ENCOUNTER — Ambulatory Visit (HOSPITAL_COMMUNITY)
Admission: RE | Admit: 2013-03-11 | Discharge: 2013-03-11 | Disposition: A | Discharge status: Home / Self Care | Payer: No Typology Code available for payment source | Source: Ambulatory Visit | Attending: Internal Medicine | Admitting: Internal Medicine

## 2013-03-11 DIAGNOSIS — Z13820 Encounter for screening for osteoporosis: Secondary | ICD-10-CM | POA: Insufficient documentation

## 2013-03-11 DIAGNOSIS — Z780 Asymptomatic menopausal state: Secondary | ICD-10-CM | POA: Insufficient documentation

## 2013-03-11 DIAGNOSIS — Z0000 Encounter for general adult medical examination without abnormal findings: Secondary | ICD-10-CM

## 2013-03-12 ENCOUNTER — Encounter: Payer: Self-pay | Admitting: Physical Medicine & Rehabilitation

## 2013-03-12 ENCOUNTER — Ambulatory Visit (HOSPITAL_BASED_OUTPATIENT_CLINIC_OR_DEPARTMENT_OTHER): Payer: No Typology Code available for payment source | Admitting: Physical Medicine & Rehabilitation

## 2013-03-12 ENCOUNTER — Encounter: Payer: No Typology Code available for payment source | Attending: Physical Medicine & Rehabilitation

## 2013-03-12 VITALS — BP 171/93 | HR 53 | Resp 14 | Ht 61.75 in | Wt 179.8 lb

## 2013-03-12 DIAGNOSIS — J449 Chronic obstructive pulmonary disease, unspecified: Secondary | ICD-10-CM | POA: Insufficient documentation

## 2013-03-12 DIAGNOSIS — M549 Dorsalgia, unspecified: Secondary | ICD-10-CM | POA: Insufficient documentation

## 2013-03-12 DIAGNOSIS — IMO0001 Reserved for inherently not codable concepts without codable children: Secondary | ICD-10-CM

## 2013-03-12 DIAGNOSIS — M4180 Other forms of scoliosis, site unspecified: Secondary | ICD-10-CM | POA: Insufficient documentation

## 2013-03-12 DIAGNOSIS — K7460 Unspecified cirrhosis of liver: Secondary | ICD-10-CM | POA: Insufficient documentation

## 2013-03-12 DIAGNOSIS — G8929 Other chronic pain: Secondary | ICD-10-CM | POA: Insufficient documentation

## 2013-03-12 DIAGNOSIS — J4489 Other specified chronic obstructive pulmonary disease: Secondary | ICD-10-CM | POA: Insufficient documentation

## 2013-03-12 DIAGNOSIS — B1920 Unspecified viral hepatitis C without hepatic coma: Secondary | ICD-10-CM | POA: Insufficient documentation

## 2013-03-12 DIAGNOSIS — M545 Low back pain, unspecified: Secondary | ICD-10-CM | POA: Insufficient documentation

## 2013-03-12 NOTE — Patient Instructions (Addendum)
Trigger point injections performed today

## 2013-03-12 NOTE — Progress Notes (Signed)
Subjective:    [name], female    DOB: December 03, 1960, 52 y.o.   MRN: [id number]  HPI Here for trigger point injection Not prescribing medications Elevated opioid risk total score of 11 Pain Inventory Average Pain 8 Pain Right Now 9 My pain is burning, dull, tingling and aching  In the last 24 hours, has pain interfered with the following? General activity 7 Relation with others 7 Enjoyment of life 8 What TIME of day is your pain at its worst? varies Sleep (in general) Fair  Pain is worse with: walking, bending, sitting, inactivity, standing and some activites Pain improves with: rest, pacing activities and medication Relief from Meds: 7  Mobility use a cane how many minutes can you walk? 5 ability to climb steps?  no do you drive?  no  Function disabled: date disabled 2008 I need assistance with the following:  bathing, household duties and shopping  Neuro/Psych bladder control problems bowel control problems numbness tremor tingling trouble walking depression anxiety  Prior Studies Any changes since last visit?  no  Physicians involved in your care Any changes since last visit?  no   Family History  Problem Relation Age of Onset  . Cervical cancer Sister   . Breast cancer Maternal Aunt   . Heart disease Father 39    died from MI  . Breast cancer Father   . Cervical cancer Daughter    History   Social History  . Marital Status: Married    Spouse Name: N/A    Number of Children: N/A  . Years of Education: N/A   Social History Main Topics  . Smoking status: Former Smoker -- 0.10 packs/day for 40 years    Types: Cigarettes    Quit date: 10/16/2011  . Smokeless tobacco: Never Used     Comment: smoked about a week ago when she got upset-brother still smokes  . Alcohol Use: No     Comment: months since last drink  . Drug Use: No  . Sexually Active: No   Other Topics Concern  . None   Social History Narrative  . None    Past Surgical History  Procedure Laterality Date  . Cervical cancer surgery    . Abdominal hysterectomy    . Tubal ligation    . Esophagogastroduodenoscopy  10/18/2011    Procedure: ESOPHAGOGASTRODUODENOSCOPY (EGD);  Surgeon: Rob Bunting, MD;  Location: [facility];  Service: Endoscopy;  Laterality: N/A;  . Hysterectomy     Past Medical History  Diagnosis Date  . Alcoholic cirrhosis   . COPD (chronic obstructive pulmonary disease)   . Heart palpitations   . Anxiety   . Portal hypertension   . Esophageal varices   . Cholelithiasis   . Splenomegaly   . Fatty liver   . Esophagitis 2010  . Tubulovillous adenoma of colon   . Visual hallucination     since 09/2010/notes 10/30/2012  . Hepatitis C     chronic hepatitis C and Steatohepatitis (hep grade 2, stage 2-3) per 05/13/08 liver biopsy  . Obesity   . cervical Cancer 1999  . Depression    BP 171/93  Pulse 53  Resp 14  Ht 5' 1.75" (1.568 m)  Wt 179 lb 12.8 oz (81.557 kg)  BMI 33.17 kg/m2  SpO2 95%    Review of Systems  Constitutional: Positive for chills, appetite change and unexpected weight change.  Respiratory: Positive for apnea, cough, shortness of breath and wheezing.   Gastrointestinal: Positive for  nausea, abdominal pain, diarrhea and constipation.       Bowel Control Problems  Genitourinary:       Bladder control problems  Musculoskeletal: Positive for back pain.       Objective:   Physical Exam Tender at right T11 T12-L1 and L2 paraspinal muscles       Assessment & Plan:  Trigger Point Injection Right T11,12,L1,2 Paraspinal  Indication: Thoracolumbar Myofascial pain not relieved by medication management and other conservative care.  Informed consent was obtained after describing risk and benefits of the procedure with the patient, this includes bleeding, bruising, infection and medication side effects.  The patient wishes to proceed and has given written consent.  The patient was placed in a  prone position.  The R thoracolumbar area was marked and prepped with Betadine.  It was entered with a 25-gauge 1-1/2 inch needle and 1 mL of 1% lidocaine was injected into each of 4 trigger points, after negative draw back for blood.  The patient tolerated the procedure well.  Post procedure instructions were given.  Schedule repeat in 2 months

## 2013-03-15 NOTE — Telephone Encounter (Signed)
Spoke w/ pt this am, she would like for you to fill it she states [facility] told her to have pcp fill

## 2013-03-19 ENCOUNTER — Telehealth: Payer: Self-pay | Admitting: *Deleted

## 2013-03-19 NOTE — Telephone Encounter (Signed)
Pt called asking for a referral to the Hepatitis clinic in  through  [facility].  She went to Dr Brooke Dare in [facility] but is not able to be seen when she wants.  The past few days she is having pain to abd area, swelling to abd like she is 7 months pregnant and swelling in legs and feet.  We are not able to see her in clinic today so I advised visit to ED for evaluation of swelling. Will also send to Dr Sherrine Maples for referral. Appointment made for 6/5 with PCP  Pt # [phone]

## 2013-03-19 NOTE — Telephone Encounter (Signed)
Ok thanks. Agree with plan.

## 2013-03-22 ENCOUNTER — Telehealth: Payer: Self-pay | Admitting: Internal Medicine

## 2013-03-22 ENCOUNTER — Encounter (HOSPITAL_COMMUNITY): Payer: Self-pay | Admitting: Adult Health

## 2013-03-22 DIAGNOSIS — I10 Essential (primary) hypertension: Secondary | ICD-10-CM | POA: Insufficient documentation

## 2013-03-22 MED ORDER — ZIPRASIDONE HCL 40 MG PO CAPS: 40.0000 mg | ORAL_CAPSULE | Freq: Two times a day (BID) | ORAL | Status: DC

## 2013-03-22 NOTE — Telephone Encounter (Signed)
  INTERNAL MEDICINE RESIDENCY PROGRAM After-Hours Telephone Call    Reason for call:   I placed an outgoing call to [name] at 6:30 pm.  Patient states that she has had right hand numbness, tingling and weakness since 9 am this morning. Her BP was 200's/111. She took one Lorazepam after talking to her mother over the phone. She checked her BP at 530 pm this afternoon and found her BP was 223/107 standing, and 158/93 sitting.        Review of Systems:  Constitutional:  Denies fever, chills, diaphoresis, appetite change and fatigue.   HEENT:  Denies congestion, sore throat, rhinorrhea, sneezing, mouth sores, trouble swallowing, neck pain   Respiratory:  Denies SOB, DOE, cough, and wheezing.   Cardiovascular:  Denies palpitations and leg swelling.   Gastrointestinal:  Denies nausea, vomiting, abdominal pain, diarrhea, constipation, blood in stool and abdominal distention.   Genitourinary:  Denies dysuria, urgency, frequency, hematuria, flank pain and difficulty urinating.   Musculoskeletal:  Denies myalgias, back pain, joint swelling, arthralgias and gait problem.   Skin:  Denies pallor, rash and wound.   Neurological:  Denies dizziness, seizures, syncope, light-headedness, and headaches.    .    She is alert and oriented x 3. States that she has no problem walking.     Last encounter / Pertinent Data:   Last seen in clinic by Dr. Allena Katz for evaluation of chronic pain on 03/10/13.     Assessment/ Plan:   I instructed patient that she needs to call 911 to have EMS to bring her to the ED for evaluation for possible stroke. Patient  voiced understanding of plan and stated that she will call 911 now. Above discussion communicated with night flow team.    As always, pt is advised that if symptoms worsen or new symptoms arise, they should go to an urgent care facility or to to ER for further evaluation.    Dede Query, MD   03/22/2013, 7:07 PM

## 2013-03-22 NOTE — ED Notes (Addendum)
Presents with c/o HTN, she staes, "My pressure was 223/111 at 9 am. I had some numbness and tingling in my right hand and a headache. I took ativan and the headache went away and the numbness and is still here. The numbness and tingling is just in my hand, not in my arm just in my hand" no droop or drift. Equal grips bilaterally. BP here 176/91. Pt denies pain. Reports feeling dizzy upon standing and walking that began this AM.

## 2013-03-22 NOTE — ED Notes (Signed)
NURSE FORIST ROUNDS ; UNABLE TO LOCATE PT. AT TRIAGE AND WAITNG AREA SEVERAL TIMES.

## 2013-03-23 ENCOUNTER — Other Ambulatory Visit: Payer: Self-pay | Admitting: *Deleted

## 2013-03-23 ENCOUNTER — Emergency Department (HOSPITAL_COMMUNITY)
Admission: EM | Admit: 2013-03-23 | Discharge: 2013-03-23 | Discharge status: Against Medical Advice | Payer: No Typology Code available for payment source | Attending: Emergency Medicine | Admitting: Emergency Medicine

## 2013-03-23 DIAGNOSIS — F419 Anxiety disorder, unspecified: Secondary | ICD-10-CM

## 2013-03-24 ENCOUNTER — Ambulatory Visit (INDEPENDENT_AMBULATORY_CARE_PROVIDER_SITE_OTHER): Payer: No Typology Code available for payment source | Admitting: Internal Medicine

## 2013-03-24 ENCOUNTER — Telehealth: Payer: Self-pay | Admitting: *Deleted

## 2013-03-24 ENCOUNTER — Encounter: Payer: Self-pay | Admitting: Internal Medicine

## 2013-03-24 VITALS — BP 168/90 | HR 52 | Temp 96.9°F | Ht 62.0 in | Wt 176.0 lb

## 2013-03-24 DIAGNOSIS — J449 Chronic obstructive pulmonary disease, unspecified: Secondary | ICD-10-CM

## 2013-03-24 DIAGNOSIS — F411 Generalized anxiety disorder: Secondary | ICD-10-CM

## 2013-03-24 DIAGNOSIS — G8929 Other chronic pain: Secondary | ICD-10-CM

## 2013-03-24 DIAGNOSIS — K7460 Unspecified cirrhosis of liver: Secondary | ICD-10-CM

## 2013-03-24 DIAGNOSIS — J4489 Other specified chronic obstructive pulmonary disease: Secondary | ICD-10-CM

## 2013-03-24 DIAGNOSIS — F419 Anxiety disorder, unspecified: Secondary | ICD-10-CM

## 2013-03-24 DIAGNOSIS — I10 Essential (primary) hypertension: Secondary | ICD-10-CM

## 2013-03-24 DIAGNOSIS — B182 Chronic viral hepatitis C: Secondary | ICD-10-CM

## 2013-03-24 MED ORDER — LORAZEPAM 1 MG PO TABS: 0.5000 mg | ORAL_TABLET | Freq: Three times a day (TID) | ORAL | Status: DC | PRN

## 2013-03-24 MED ORDER — FUROSEMIDE 20 MG PO TABS: 20.0000 mg | ORAL_TABLET | Freq: Two times a day (BID) | ORAL | Status: DC

## 2013-03-24 MED ORDER — ALBUTEROL SULFATE HFA 108 (90 BASE) MCG/ACT IN AERS: 4.0000 | INHALATION_SPRAY | Freq: Every day | RESPIRATORY_TRACT | Status: DC

## 2013-03-24 NOTE — Patient Instructions (Addendum)
1. Please take lactulose every other day. 2. I will start you on furosemide which is of fluid for hypertension. Take it every day. He will experience increased urine frequency.

## 2013-03-24 NOTE — Progress Notes (Signed)
Subjective:   [name] female   DOB: [birth year] 52 y.o.   MRN: [id number]  HPI: [name] is a 52 y.o. female with past history significant as outlined below who presented to the clinic for elevated blood pressures. Patient noted that she has been experiencing episodes of anxiety and whenever his abdomen is somewhat acting funny she feels that her blood pressure goes up. It is noted that blood pressures have been elevated just since this month. Patient denies any headache, chest pain, shortness of breath.    Past Medical History  Diagnosis Date  . Alcoholic cirrhosis   . COPD (chronic obstructive pulmonary disease)   . Heart palpitations   . Anxiety   . Portal hypertension   . Esophageal varices   . Cholelithiasis   . Splenomegaly   . Fatty liver   . Esophagitis 2010  . Tubulovillous adenoma of colon   . Visual hallucination     since 09/2010/notes 10/30/2012  . Hepatitis C     chronic hepatitis C and Steatohepatitis (hep grade 2, stage 2-3) per 05/13/08 liver biopsy  . Obesity   . cervical Cancer 1999  . Depression    Current Outpatient Prescriptions  Medication Sig Dispense Refill  . albuterol (PROVENTIL HFA;VENTOLIN HFA) 108 (90 BASE) MCG/ACT inhaler Inhale 4 puffs into the lungs daily.  1 Inhaler  2  . Ipratropium-Albuterol (COMBIVENT) 20-100 MCG/ACT AERS respimat Inhale 1 puff into the lungs every 6 (six) hours as needed for wheezing.  1 Inhaler  11  . lactulose (CHRONULAC) 10 GM/15ML solution Take 45 mLs (30 g total) by mouth daily.  240 mL  2  . LORazepam (ATIVAN) 1 MG tablet Take 0.5 tablets (0.5 mg total) by mouth every 8 (eight) hours as needed for anxiety.  45 tablet  0  . methocarbamol (ROBAXIN) 500 MG tablet Take 1 tablet (500 mg total) by mouth 2 (two) times daily.  6 tablet  0  . pantoprazole (PROTONIX) 40 MG tablet Take 1 tablet (40 mg total) by mouth daily.  30 tablet  11  . propranolol (INDERAL) 20 MG tablet Take 20 mg by  mouth daily.      . traMADol (ULTRAM) 50 MG tablet Take 1 tablet (50 mg total) by mouth every 6 (six) hours as needed for pain.  30 tablet  0  . ziprasidone (GEODON) 40 MG capsule Take 1 capsule (40 mg total) by mouth 2 (two) times daily with a meal.  60 capsule  3   No current facility-administered medications for this visit.   Family History  Problem Relation Age of Onset  . Cervical cancer Sister   . Breast cancer Maternal Aunt   . Heart disease Father 59    died from MI  . Breast cancer Father   . Cervical cancer Daughter    History   Social History  . Marital Status: Married    Spouse Name: N/A    Number of Children: N/A  . Years of Education: N/A   Social History Main Topics  . Smoking status: Former Smoker -- 0.10 packs/day for 40 years    Types: Cigarettes    Quit date: 10/16/2011  . Smokeless tobacco: Never Used     Comment: smoked about a week ago when she got upset-brother still smokes  . Alcohol Use: No     Comment: months since last drink  . Drug Use: No  . Sexually Active: None   Other Topics Concern  .  None   Social History Narrative  . None   Review of Systems: Constitutional: Denies fever, chills, diaphoresis, appetite change and fatigue.  Respiratory: Denies SOB, DOE, cough, chest tightness,  and wheezing.   Cardiovascular: Denies chest pain, palpitations but  leg swelling.  Gastrointestinal: Denies nausea, vomiting, abdominal pain, blood in stool and abdominal distention. but noted diarrhea   Neurological: Denies dizziness,  Hematological: Denies adenopathy. Easy bruising  Objective:  Physical Exam: Filed Vitals:   03/24/13 1352  BP: 171/87  Pulse: 61  Temp: 96.9 F (36.1 C)  TempSrc: Oral  Height: 5\' 2"  (1.575 m)  Weight: 176 lb (79.833 kg)  SpO2: 96%   Constitutional: Vital signs reviewed.  Patient is a well-developed and well-nourished female  in no acute distress and cooperative with exam. Alert and oriented x3.   Neck: Supple,   Cardiovascular: RRR, S1 normal, S2 normal, no MRG, pulses symmetric and intact bilaterally. Traces of leg edema bilaterally  Pulmonary/Chest: normal respiratory effort, CTAB, no wheezes, rales, or rhonchi Abdominal: Soft. Non-tender, non-distended, bowel sounds are normal, no masses, organomegaly, or guarding present.  GU: no CVA tendernessr Hematology: no cervical adenopathy.  Neurological: A&O x3, Strength is normal and symmetric bilaterally,

## 2013-03-24 NOTE — Assessment & Plan Note (Addendum)
Currently on propanol 20 mg daily which would not treat hypertension. Paternal most likely due to   Esophagus varices on EGD in 2012.  I will start patient on Lasix 20 mg daily. Reevaluate patient  during next office visit. The patient has persistent hypertension consider to increase propanolol dose or switch to nadolol.

## 2013-03-24 NOTE — Assessment & Plan Note (Signed)
Patient currently on Geodon and Ativan. Prescription for Ativan was sent to the pharmacy today. Patient has followup on Monday with counseling at [facility]. Patient is noted to have tremors. Consider discussion about possible changes in medical management. Instructed patient to discuss to the appointment with [facility].

## 2013-03-24 NOTE — Assessment & Plan Note (Signed)
I was stopped meloxicam considering patient's history of gastritis and esophagus varices

## 2013-03-24 NOTE — Telephone Encounter (Signed)
Called to pharm 

## 2013-03-24 NOTE — Progress Notes (Signed)
Case discussed with Dr. Illath at time of visit, soon after the resident saw the patient.  We reviewed the resident's history and exam and pertinent patient test results.  I agree with the assessment, diagnosis, and plan of care documented in the resident's note. 

## 2013-03-30 ENCOUNTER — Other Ambulatory Visit: Payer: Self-pay | Admitting: *Deleted

## 2013-03-30 DIAGNOSIS — G8929 Other chronic pain: Secondary | ICD-10-CM

## 2013-03-30 NOTE — Telephone Encounter (Signed)
reference 

## 2013-04-01 ENCOUNTER — Ambulatory Visit (INDEPENDENT_AMBULATORY_CARE_PROVIDER_SITE_OTHER): Payer: No Typology Code available for payment source | Admitting: Internal Medicine

## 2013-04-01 ENCOUNTER — Encounter: Payer: Self-pay | Admitting: Internal Medicine

## 2013-04-01 VITALS — BP 132/86 | HR 71 | Temp 96.8°F | Ht 61.5 in | Wt 178.9 lb

## 2013-04-01 DIAGNOSIS — K7460 Unspecified cirrhosis of liver: Secondary | ICD-10-CM

## 2013-04-01 DIAGNOSIS — B182 Chronic viral hepatitis C: Secondary | ICD-10-CM

## 2013-04-01 DIAGNOSIS — J449 Chronic obstructive pulmonary disease, unspecified: Secondary | ICD-10-CM

## 2013-04-01 DIAGNOSIS — G8929 Other chronic pain: Secondary | ICD-10-CM

## 2013-04-01 DIAGNOSIS — F411 Generalized anxiety disorder: Secondary | ICD-10-CM

## 2013-04-01 DIAGNOSIS — F419 Anxiety disorder, unspecified: Secondary | ICD-10-CM

## 2013-04-01 DIAGNOSIS — I10 Essential (primary) hypertension: Secondary | ICD-10-CM

## 2013-04-01 MED ORDER — FUROSEMIDE 20 MG PO TABS: 20.0000 mg | ORAL_TABLET | Freq: Two times a day (BID) | ORAL | Status: DC

## 2013-04-01 MED ORDER — TRAMADOL HCL 50 MG PO TABS: 50.0000 mg | ORAL_TABLET | Freq: Four times a day (QID) | ORAL | Status: DC | PRN

## 2013-04-01 NOTE — Assessment & Plan Note (Signed)
She c/o of chronic SOB. PFTs were done sometime over the past year, but I cannot find the results anywhere- might not be in the system yet. I have tried to put her on daily inhaled medication but have had difficulty finding something she can afford. She does have an [employer] now, so I have asked her to contact [facility] regarding what daily control medication do they have available. She said she would call them within the next few days.

## 2013-04-01 NOTE — Patient Instructions (Signed)
**Continue to take all of your medications as prescribed. You should still be taking the Propranolol and the Lasix.   Hepatitis C Hepatitis C is a viral infection of the liver. Infection may go undetected for months or years because symptoms may be absent or very mild. Chronic liver disease is the main danger of hepatitis C. This may lead to scarring of the liver (cirrhosis), liver failure, and liver cancer. CAUSES  Hepatitis C is caused by the hepatitis C virus (HCV). Formerly, hepatitis C infections were most commonly transmitted through blood transfusions. In the early 1990s, routine testing of donated blood for hepatitis C and exclusion of blood that tests positive for HCV began. Now, HCV is most commonly transmitted from person to person through injection drug use, sharing needles, or sex with an infected person. A caregiver may also get the infection from exposure to the blood of an infected patient by way of a cut or needle stick.  SYMPTOMS  Acute Phase Many cases of acute HCV infection are mild and cause few problems.Some people may not even realize they are sick.Symptoms in others may last a few weeks to several months and include:  Feeling very tired.  Loss of appetite.  Nausea.  Vomiting.  Abdominal pain.  Dark yellow urine.  Yellow skin and eyes (jaundice).  Itching of the skin. Chronic Phase  Between 50% to 85% of people who get HCV infection become "chronic carriers." They often have no symptoms, but the virus stays in their body.They may spread the virus to others and can get long-term liver disease.  Many people with chronic HCV infection remain healthy for many years. However, up to 1 in 5 chronically infected people may develop severe liver diseases including scarring of the liver (cirrhosis), liver failure, or liver cancer. DIAGNOSIS  Diagnosis of hepatitis C infection is made by testing blood for the presence of hepatitis C viral particles called RNA. Other  tests may also be done to measure the status of current liver function, exclude other liver problems, or assess liver damage. TREATMENT  Treatment with many antiviral drugs is available and recommended for some patients with chronic HCV infection. Drug treatment is generally considered appropriate for patients who:  Are 54 years of age or older.  Have a positive test for HCV particles in the blood.  Have a liver tissue sample (biopsy) that shows chronic hepatitis and significant scarring (fibrosis).  Do not have signs of liver failure.  Have acceptable blood test results that confirm the wellness of other body organs.  Are willing to be treated and conform to treatment requirements.  Have no other circumstances that would prevent treatment from being recommended (contraindications). All people who are offered and choose to receive drug treatment must understand that careful medical follow up for many months and even years is crucial in order to make successful care possible. The goal of drug treatment is to eliminate any evidence of HCV in the blood on a long-term basis. This is called a "sustained virologic response" or SVR. Achieving a SVR is associated with a decrease in the chance of life-threatening liver problems, need for a liver transplant, liver cancer rates, and liver-related complications. Successful treatment currently requires taking treatment drugs for at least 24 weeks and up to 72 weeks. An injected drug (interferon) given weekly and an oral antiviral medicine taken daily are usually prescribed. Side effects from these drugs are common and some may be very serious. Your response to treatment must be carefully monitored  by both you and your caregiver throughout the entire treatment period. PREVENTION There is no vaccine for hepatitis C. The only way to prevent the disease is to reduce the risk of exposure to the virus.   Avoid sharing drug needles or personal items like  toothbrushes, razors, and nail clippers with an infected person.  Healthcare workers need to avoid injuries and wear appropriate protective equipment such as gloves, gowns, and face masks when performing invasive medical or nursing procedures. HOME CARE INSTRUCTIONS  To avoid making your liver disease worse:  Strictly avoid drinking alcohol.  Carefully review all new prescriptions of medicines with your caregiver. Ask your caregiver which drugs you should avoid. The following drugs are toxic to the liver, and your caregiver may tell you to avoid them:  Isoniazid.  Methyldopa.  Acetaminophen.  Anabolic steroids (muscle-building drugs).  Erythromycin.  Oral contraceptives (birth control pills).  Check with your caregiver to make sure medicine you are currently taking will not be harmful.  Periodic blood tests may be required. Follow your caregiver's advice about when you should have blood tests.  Avoid a sexual relationship until advised otherwise by your caregiver.  Avoid activities that could expose other people to your blood. Examples include sharing a toothbrush, nail clippers, razors, and needles.  Bed rest is not necessary, but it may make you feel better. Recovery time is not related to the amount of rest you receive.  This infection is contagious. Follow your caregiver's instructions in order to avoid spread of the infection. SEEK IMMEDIATE MEDICAL CARE IF:  You have increasing fatigue or weakness.  You have an oral temperature above 102 F (38.9 C), not controlled by medicine.  You develop loss of appetite, nausea, or vomiting.  You develop jaundice.  You develop easy bruising or bleeding.  You develop any severe problems as a result of your treatment. MAKE SURE YOU:   Understand these instructions.  Will watch your condition.  Will get help right away if you are not doing well or get worse. Document Released: 10/11/2000 Document Revised: 01/06/2012  Document Reviewed: 02/13/2011 [facility] Patient Information 2014 [place], [place].

## 2013-04-01 NOTE — Assessment & Plan Note (Addendum)
BP Readings from Last 3 Encounters:  04/01/13 132/86  03/24/13 168/90  03/22/13 176/91    Lab Results  Component Value Date   NA 140 03/09/2013   K 3.9 03/09/2013   CREATININE 0.49* 03/09/2013    Assessment: Blood pressure control: controlled Progress toward BP goal:  improved   Plan: Medications:  continue current medications Educational resources provided: brochure;handout;video Self management tools provided:    **She was started on Lasix at her last visit and her blood pressure looks much better today. - Checking BMP - F/u in 2 months

## 2013-04-01 NOTE — Progress Notes (Signed)
[name], female   DOB: Nov 24, 1960, 52 y.o.   MRN: [id number]  Subjective:   [name] female   DOB: May 25, 1961 52 y.o.   MRN: [id number]  HPI: [name] is a 52 y.o. female with PMH cirrhosis from chronic hep C and alcohol abuse, significant anxiety, and a h/o visual hallucinations presents to the clinic for a follow up appointment after being seen for HTN.   Please refer to Problem List based HPI  Past Medical History  Diagnosis Date  . Alcoholic cirrhosis   . COPD (chronic obstructive pulmonary disease)   . Heart palpitations   . Anxiety   . Portal hypertension   . Esophageal varices   . Cholelithiasis   . Splenomegaly   . Fatty liver   . Esophagitis 2010  . Tubulovillous adenoma of colon   . Visual hallucination     since 09/2010/notes 10/30/2012  . Hepatitis C     chronic hepatitis C and Steatohepatitis (hep grade 2, stage 2-3) per 05/13/08 liver biopsy  . Obesity   . cervical Cancer 1999  . Depression    Current Outpatient Prescriptions  Medication Sig Dispense Refill  . albuterol (PROVENTIL HFA;VENTOLIN HFA) 108 (90 BASE) MCG/ACT inhaler Inhale 4 puffs into the lungs daily.  1 Inhaler  2  . furosemide (LASIX) 20 MG tablet Take 1 tablet (20 mg total) by mouth 2 (two) times daily.  30 tablet  6  . Ipratropium-Albuterol (COMBIVENT) 20-100 MCG/ACT AERS respimat Inhale 1 puff into the lungs every 6 (six) hours as needed for wheezing.  1 Inhaler  11  . lactulose (CHRONULAC) 10 GM/15ML solution Take 45 mLs (30 g total) by mouth daily.  240 mL  2  . LORazepam (ATIVAN) 1 MG tablet Take 0.5 tablets (0.5 mg total) by mouth every 8 (eight) hours as needed for anxiety.  45 tablet  0  . methocarbamol (ROBAXIN) 500 MG tablet Take 1 tablet (500 mg total) by mouth 2 (two) times daily.  6 tablet  0  . pantoprazole (PROTONIX) 40 MG tablet Take 1 tablet (40 mg total) by mouth daily.  30 tablet  11  . propranolol (INDERAL) 20 MG  tablet Take 20 mg by mouth daily.      . ziprasidone (GEODON) 40 MG capsule Take 1 capsule (40 mg total) by mouth 2 (two) times daily with a meal.  60 capsule  3  . traMADol (ULTRAM) 50 MG tablet Take 1 tablet (50 mg total) by mouth every 6 (six) hours as needed for pain.  60 tablet  0   No current facility-administered medications for this visit.   Family History  Problem Relation Age of Onset  . Cervical cancer Sister   . Breast cancer Maternal Aunt   . Heart disease Father 96    died from MI  . Breast cancer Father   . Cervical cancer Daughter    History   Social History  . Marital Status: Married    Spouse Name: N/A    Number of Children: N/A  . Years of Education: N/A   Social History Main Topics  . Smoking status: Former Smoker -- 0.10 packs/day for 40 years    Types: Cigarettes    Quit date: 10/16/2011  . Smokeless tobacco: Never Used     Comment: smoked about a week ago when she got upset-brother still smokes  . Alcohol Use: No     Comment: months since last drink  .  Drug Use: No  . Sexually Active: None   Other Topics Concern  . None   Social History Narrative  . None   Review of Systems: A 10 point ROS was performed; pertinent positives and negatives were noted in the HPI   Objective:  Physical Exam: Filed Vitals:   04/01/13 1352  BP: 132/86  Pulse: 71  Temp: 96.8 F (36 C)  TempSrc: Oral  Height: 5' 1.5" (1.562 m)  Weight: 178 lb 14.4 oz (81.149 kg)  SpO2: 94%   Constitutional: Vital signs reviewed.  Patient is a well-developed and well-nourished female in no acute distress and cooperative with exam. Alert and oriented x3.  Head: Normocephalic and atraumatic Eyes: PERRL, EOMI, conjunctivae normal, No scleral icterus.  Cardiovascular: RRR, no MRG, pulses symmetric and intact bilaterally Pulmonary/Chest: Normal respiratory effort, CTAB, no wheezes, rales, or rhonchi Abdominal: Soft. TTP to deep palpation, liver palpable,  non-distended Musculoskeletal: No joint deformities Neurological: A&O x3, nonfocal in BU&LE Skin: Warm, dry and intact. No rash, cyanosis, or clubbing. No jaundice appreciated. Psychiatric: Normal mood and affect. speech and behavior are normal.  Assessment & Plan:   Please refer to Problem List based Assessment and Plan

## 2013-04-01 NOTE — Assessment & Plan Note (Signed)
She is currently on Geodon and Ativan and is being seen at [facility]. Patient assistance information was filled out through the [employer] during her last visit with me, and I gave her this info today. She is receiving the Geodon through [facility], but once their supply runs out, I will begin prescribing it for her- she seems to be doing better mentally on this medication. She received a refill for th Ativan on 03/24/13.

## 2013-04-01 NOTE — Assessment & Plan Note (Addendum)
Pt was referred and seen by Dr. Jacqualine Mau at the Hep C clinic in [facility], but no longer wants to be seen there b/c they could not fit her in when she wanted to be seen.  She is requesting to be seen here at [facility], but we no longer have a Hep C specialist affiliated with [facility], so no one is available who accepts the [employer]. She acknowledges her understanding and will contact Dr. Jacqualine Mau for a follow up appointment. She is complaining about RUQ pain and is tender to deep palpation. LFTs for 5/13 are more elevated than previously. She is to f/u with Dr. Jacqualine Mau and we will repeat a CMP at her next clinic visit.

## 2013-04-02 ENCOUNTER — Other Ambulatory Visit: Payer: Self-pay | Admitting: *Deleted

## 2013-04-02 DIAGNOSIS — I10 Essential (primary) hypertension: Secondary | ICD-10-CM

## 2013-04-02 LAB — BASIC METABOLIC PANEL WITH GFR
Calcium: 8.7 mg/dL (ref 8.4–10.5)
Creat: 0.62 mg/dL (ref 0.50–1.10)
GFR, Est African American: >89 mL/min
Glucose, Bld: 64 mg/dL — ABNORMAL LOW (ref 70–99)
Sodium: 137 mEq/L (ref 135–145)

## 2013-04-02 NOTE — Telephone Encounter (Signed)
Fax from [facility]- requesting new rx for qty# 60 instead of #30 for BID instruction.  Thanks

## 2013-04-02 NOTE — Progress Notes (Signed)
TEACHING ATTENDING ADDENDUM: I discussed this case with Dr. Glenn at the time of the patient visit. I agree with the HPI, exam findings and have read the documentation provided by the resident, and I concur with the plan of care. Please see the resident note for details of management.    

## 2013-04-05 MED ORDER — FUROSEMIDE 20 MG PO TABS: 20.0000 mg | ORAL_TABLET | Freq: Two times a day (BID) | ORAL | Status: DC

## 2013-04-07 ENCOUNTER — Encounter: Payer: Self-pay | Admitting: Obstetrics & Gynecology

## 2013-04-07 ENCOUNTER — Ambulatory Visit (INDEPENDENT_AMBULATORY_CARE_PROVIDER_SITE_OTHER): Payer: No Typology Code available for payment source | Admitting: Obstetrics & Gynecology

## 2013-04-07 ENCOUNTER — Telehealth: Payer: Self-pay | Admitting: *Deleted

## 2013-04-07 VITALS — BP 119/86 | HR 73 | Temp 97.2°F | Ht 61.75 in | Wt 172.2 lb

## 2013-04-07 DIAGNOSIS — R87610 Atypical squamous cells of undetermined significance on cytologic smear of cervix (ASC-US): Secondary | ICD-10-CM

## 2013-04-07 NOTE — Telephone Encounter (Signed)
Cartha called today- has questions about colposcopy- questions answered.

## 2013-04-07 NOTE — Progress Notes (Signed)
  Subjective:    [name], female    DOB: [birth year], 52 y.o.   MRN: [id number]  HPI  She is here for a colposcopy after the pap of her vaginal cuff showed LGSIL, negative HR HPV DNA. She gives a h/o cervical cancer treated at [facility] greater than a decade ago. She doesn't remember having radiation or chemotherapy.   Review of Systems     Objective:   Physical Exam   I did a time out procedure. I placed 4 swabs saturated with acetic acid up against her atrophic vaginal cuff. They stayed there for 3 minutes. I inspected the area with a colposcope and saw no abnormalities. She tolerated the procedure well.     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 Her colposcopy is normal today. I have sent an email to Dr. Laurette Schimke, gyn onc, asking for advice on the interval until next screening.

## 2013-04-13 ENCOUNTER — Encounter: Payer: Self-pay | Admitting: *Deleted

## 2013-04-13 ENCOUNTER — Other Ambulatory Visit: Payer: Self-pay | Admitting: Internal Medicine

## 2013-04-19 ENCOUNTER — Telehealth: Payer: Self-pay

## 2013-04-19 NOTE — Telephone Encounter (Signed)
Patient called requesting methocarbamol refill.  Notes show we have not prescribed this and that patient is no longer taking it.  Patient request to be see earlier so she does not run out of medication.  Appointment made for 04/21/13 at 10 patient aware.

## 2013-04-21 ENCOUNTER — Encounter: Payer: Self-pay | Admitting: Physical Medicine and Rehabilitation

## 2013-04-21 ENCOUNTER — Encounter
Payer: No Typology Code available for payment source | Attending: Physical Medicine and Rehabilitation | Admitting: Physical Medicine and Rehabilitation

## 2013-04-21 VITALS — BP 107/67 | HR 71 | Resp 16 | Ht 61.0 in | Wt 181.0 lb

## 2013-04-21 DIAGNOSIS — M4180 Other forms of scoliosis, site unspecified: Secondary | ICD-10-CM | POA: Insufficient documentation

## 2013-04-21 DIAGNOSIS — B1920 Unspecified viral hepatitis C without hepatic coma: Secondary | ICD-10-CM | POA: Insufficient documentation

## 2013-04-21 DIAGNOSIS — M51379 Other intervertebral disc degeneration, lumbosacral region without mention of lumbar back pain or lower extremity pain: Secondary | ICD-10-CM | POA: Insufficient documentation

## 2013-04-21 DIAGNOSIS — M5137 Other intervertebral disc degeneration, lumbosacral region: Secondary | ICD-10-CM | POA: Insufficient documentation

## 2013-04-21 DIAGNOSIS — J449 Chronic obstructive pulmonary disease, unspecified: Secondary | ICD-10-CM | POA: Insufficient documentation

## 2013-04-21 DIAGNOSIS — M545 Low back pain, unspecified: Secondary | ICD-10-CM | POA: Insufficient documentation

## 2013-04-21 DIAGNOSIS — J4489 Other specified chronic obstructive pulmonary disease: Secondary | ICD-10-CM | POA: Insufficient documentation

## 2013-04-21 DIAGNOSIS — K7460 Unspecified cirrhosis of liver: Secondary | ICD-10-CM | POA: Insufficient documentation

## 2013-04-21 DIAGNOSIS — M25559 Pain in unspecified hip: Secondary | ICD-10-CM | POA: Insufficient documentation

## 2013-04-21 DIAGNOSIS — M47817 Spondylosis without myelopathy or radiculopathy, lumbosacral region: Secondary | ICD-10-CM

## 2013-04-21 MED ORDER — METHOCARBAMOL 500 MG PO TABS: 500.0000 mg | ORAL_TABLET | Freq: Two times a day (BID) | ORAL | Status: DC

## 2013-04-21 NOTE — Progress Notes (Signed)
Subjective:    [name], female    DOB: 1960/11/27, 52 y.o.   MRN: [id number]  HPI The patient is a 52 year old female, who presents with LBP which radiates into the left posterior hip . The symptoms started 6 weeks ago . The patient complains about moderate pain, which radiates into the posterior left hip. Patient denies numbness and tingling in  . She describes the pain as dull achy and sometimes burning. Applying heat, taking medications , changing positions alleviate the symptoms. Prolonged standing, walking and sitting aggrevates the symptoms. The patient grades his pain as a  8/10. Patient reports that she is doing PT at [facility], she can not really say how often she has been there.  Has had MRI of the lumbar spine showing mild scoliosis,Small disc bulges, no evidence of nerve root impingement.   Gets partial relief with tramadol 50 mg 3 times a day and Mobic 15 mg a day  Pain Inventory Average Pain 0 Pain Right Now 8 My pain is burning and aching  In the last 24 hours, has pain interfered with the following? General activity 0 Relation with others 0 Enjoyment of life 0 What TIME of day is your pain at its worst? na Sleep (in general) NA  Pain is worse with: walking, bending, sitting, standing and some activites Pain improves with: na Relief from Meds: 8  Mobility use a cane how many minutes can you walk? 10 do you drive?  no  Function Do you have any goals in this area?  no  Neuro/Psych No problems in this area  Prior Studies Any changes since last visit?  no  Physicians involved in your care Any changes since last visit?  no   Family History  Problem Relation Age of Onset  . Cervical cancer Sister   . Breast cancer Maternal Aunt   . Heart disease Father 34    died from MI  . Breast cancer Father   . Cervical cancer Daughter    History   Social History  . Marital Status: Married    Spouse Name: N/A    Number of Children:  N/A  . Years of Education: N/A   Social History Main Topics  . Smoking status: Former Smoker -- 0.10 packs/day for 40 years    Types: Cigarettes    Quit date: 10/16/2011  . Smokeless tobacco: Never Used     Comment: smoked about a week ago when she got upset-brother still smokes  . Alcohol Use: No     Comment: months since last drink  . Drug Use: No  . Sexually Active: None   Other Topics Concern  . None   Social History Narrative  . None   Past Surgical History  Procedure Laterality Date  . Cervical cancer surgery    . Abdominal hysterectomy    . Tubal ligation    . Esophagogastroduodenoscopy  10/18/2011    Procedure: ESOPHAGOGASTRODUODENOSCOPY (EGD);  Surgeon: Rob Bunting, MD;  Location: [facility];  Service: Endoscopy;  Laterality: N/A;  . Hysterectomy     Past Medical History  Diagnosis Date  . Alcoholic cirrhosis   . COPD (chronic obstructive pulmonary disease)   . Heart palpitations   . Anxiety   . Portal hypertension   . Esophageal varices   . Cholelithiasis   . Splenomegaly   . Fatty liver   . Esophagitis 2010  . Tubulovillous adenoma of colon   . Visual hallucination  since 09/2010/notes 10/30/2012  . Hepatitis C     chronic hepatitis C and Steatohepatitis (hep grade 2, stage 2-3) per 05/13/08 liver biopsy  . Obesity   . cervical Cancer 1999  . Depression    BP 107/67  Pulse 71  Resp 16  Ht 5\' 1"  (1.549 m)  Wt 181 lb (82.101 kg)  BMI 34.22 kg/m2  SpO2 95%     Review of Systems  All other systems reviewed and are negative.       Objective:   Physical Exam  Vitals reviewed. Constitutional: She is oriented to person, place, and time. She appears well-developed and well-nourished.  HENT:  Head: Normocephalic.  Neck: Neck supple.  Musculoskeletal: She exhibits tenderness.  Neurological: She is alert and oriented to person, place, and time.  Skin: Skin is warm and dry.  Psychiatric: She has a normal mood and affect.  Cognitive very  slow    Symmetric normal motor tone is noted throughout. Normal muscle bulk. Muscle testing reveals 4/5 muscle strength of the upper extremity, and 4/5 of the lower extremity, patient is deconditioned. Full range of motion in upper and lower extremities. ROM of spine is restricted. Fine motor movements are normal in both hands.  DTR in the upper and lower extremity are present and symmetric 2+. No clonus is noted.  Patient arises from chair without difficulty. Wide based gait with normal arm swing bilateral , able to stand on heels, could not stand on heels, too painful for patient  . Tandem walk is possible but slighttly instable. No pronator drift. Rhomberg negative. Resting head tremor noted.        Assessment & Plan:   1. LBP,radiating to the posterior hip on the left intermittendly.  Without evidence of nerve root or spinal cord impingement. The patient is doing physical therapy . 2. hepatitis C with cirrhosis as well as COPD.  Patient would like to have narcotic analgesic pain medications,however her opioid risk total score is High at 11. She disputes the alcohol history.  She does get partial control with the tramadol and we continued this, would not go to a higher dose because of her history of cirrhosis. MRI of L-spine, from 02/2012 showed : Findings: Scan extends from T10-11 through S4. Tip of the conus is  at L1 and appears normal. There is a slight rotoscoliosis of the  lumbar spine. There is a slight spondylolisthesis of T12 on L1 of  2 mm and a retrolisthesis of L1 on L2 of 5 mm. No neural  impingement at either level.  There are small bulges of the discs from L1-2 through L5-L1 with no  neural impingement at any level. No significant foraminal stenosis  or severe facet joint arthritis.  The paraspinal soft tissues are normal. Vestigial disc at S1-2.  IMPRESSION:  Slight lumbar scoliosis with multilevel slight degenerative disc  disease without neural  impingement.  Patient should continue with her medications, and her PT, I also advised the patient to walk or exercise in her mother's pool.

## 2013-04-21 NOTE — Patient Instructions (Signed)
Continue with PT, continue with your exercise program at home. Try to start a walking program as pain permits. Try to walk in the pool.

## 2013-04-23 ENCOUNTER — Other Ambulatory Visit: Payer: Self-pay | Admitting: Internal Medicine

## 2013-04-23 NOTE — Progress Notes (Signed)
[name], female   DOB: 11/28/1960, 52 y.o.   MRN: [id number] CSW received copy of [facility] records and faxed to [facility] on 04/07/2013 requesting appointment.  Pt is scheduled with Dr. Donell Beers at [facility] on 05/26/13.

## 2013-04-26 ENCOUNTER — Telehealth: Payer: Self-pay | Admitting: *Deleted

## 2013-04-26 NOTE — Telephone Encounter (Signed)
Pt called - past 2 weeks pain in back and feet worse. Appt made 04/29/13 2:45PM Dr Sherrine Maples. Stanton Kidney Eddy Termine RN 04/26/13 2PM

## 2013-04-27 ENCOUNTER — Emergency Department (HOSPITAL_COMMUNITY)
Admission: EM | Admit: 2013-04-27 | Discharge: 2013-04-27 | Discharge status: Against Medical Advice | Payer: No Typology Code available for payment source

## 2013-04-27 ENCOUNTER — Ambulatory Visit (INDEPENDENT_AMBULATORY_CARE_PROVIDER_SITE_OTHER): Payer: No Typology Code available for payment source | Admitting: Internal Medicine

## 2013-04-27 ENCOUNTER — Other Ambulatory Visit: Payer: Self-pay | Admitting: *Deleted

## 2013-04-27 ENCOUNTER — Encounter: Payer: Self-pay | Admitting: Internal Medicine

## 2013-04-27 VITALS — BP 113/77 | HR 83 | Temp 97.2°F | Ht 60.0 in | Wt 178.7 lb

## 2013-04-27 DIAGNOSIS — M549 Dorsalgia, unspecified: Secondary | ICD-10-CM

## 2013-04-27 DIAGNOSIS — G8929 Other chronic pain: Secondary | ICD-10-CM

## 2013-04-27 DIAGNOSIS — M899 Disorder of bone, unspecified: Secondary | ICD-10-CM

## 2013-04-27 DIAGNOSIS — J449 Chronic obstructive pulmonary disease, unspecified: Secondary | ICD-10-CM

## 2013-04-27 DIAGNOSIS — M8580 Other specified disorders of bone density and structure, unspecified site: Secondary | ICD-10-CM

## 2013-04-27 MED ORDER — ALBUTEROL SULFATE HFA 108 (90 BASE) MCG/ACT IN AERS: 4.0000 | INHALATION_SPRAY | Freq: Every day | RESPIRATORY_TRACT | Status: DC

## 2013-04-27 MED ORDER — TRAMADOL HCL 50 MG PO TABS: 50.0000 mg | ORAL_TABLET | Freq: Four times a day (QID) | ORAL | Status: DC | PRN

## 2013-04-27 MED ORDER — PREDNISONE 20 MG PO TABS: 40.0000 mg | ORAL_TABLET | Freq: Every day | ORAL | Status: DC

## 2013-04-27 MED ORDER — AZITHROMYCIN 250 MG PO TABS: ORAL_TABLET | ORAL | Status: DC

## 2013-04-27 MED ORDER — IPRATROPIUM-ALBUTEROL 20-100 MCG/ACT IN AERS: 1.0000 | INHALATION_SPRAY | Freq: Four times a day (QID) | RESPIRATORY_TRACT | Status: DC | PRN

## 2013-04-27 NOTE — Patient Instructions (Signed)
Return to clinic 05/27/13 with Dr. Sherrine Maples Take Azithromycin for 5 days (500 mg on the 1st day then 250 mg for 4 days and Prednisone 40 mg for 5 days  I will give you a one month supply of Ultram to take 4x/day but we must come up with a long term plan Review the information below   Chronic Obstructive Pulmonary Disease Chronic obstructive pulmonary disease (COPD) is a condition in which airflow from the lungs is restricted. The lungs can never return to normal, but there are measures you can take which will improve them and make you feel better. CAUSES   Smoking.  Exposure to secondhand smoke.  Breathing in irritants such as air pollution, dust, cigarette smoke, strong odors, aerosol sprays, or paint fumes.  History of lung infections. SYMPTOMS   Deep, persistent (chronic) cough with a large amount of thick mucus.  Wheezing.  Shortness of breath, especially with physical activity.  Feeling like you cannot get enough air.  Difficulty breathing.  Rapid breaths (tachypnea).  Gray or bluish discoloration (cyanosis) of the skin, especially in fingers, toes, or lips.  Fatigue.  Weight loss.  Swelling in legs, ankles, or feet.  Fast heartbeat (tachycardia).  Frequent lung infections.   Chest tightness. DIAGNOSIS  Initial diagnosis may be based on your history, symptoms, and physical examination. Additional tests for COPD may include:  Chest X-ray.  Computed tomography (CT) scan.  Lung (pulmonary) function tests.  Blood tests. TREATMENT  Treatment focuses on making you comfortable (supportive care). Your caregiver may prescribe medicines (inhaled or pills) to help improve your breathing. Additional treatment options may include oxygen therapy and pulmonary rehabilitation. Treatment should also include reducing your exposure to known irritants and following a plan to stop smoking. HOME CARE INSTRUCTIONS   Take all medicines, including antibiotic medicines, as directed by  your caregiver.  Use inhaled medicines as directed by your caregiver.  Avoid medicines or cough syrups that dry up your airway (antihistamines) and slow down the elimination of secretions. This decreases respiratory capacity and may lead to infections.  If you smoke, stop smoking.  Avoid exposure to smoke, chemicals, and fumes that aggravate your breathing.  Avoid contact with individuals that have a contagious illness.  Avoid extreme temperature and humidity changes.  Use humidifiers at home and at your bedside if they do not make breathing difficult.  Drink enough water and fluids to keep your urine clear or pale yellow. This loosens secretions.  Eat healthy foods. Eating smaller, more frequent meals and resting before meals may help you maintain your strength.  Ask your caregiver about the use of vitamins and mineral supplements.  Stay active. Exercise and physical activity will help maintain your ability to do things you want to do.  Balance activity with periods of rest.  Assume a position of comfort if you become short of breath.  Learn and use relaxation techniques.  Learn and use controlled breathing techniques as directed by your caregiver. Controlled breathing techniques include:  Pursed lip breathing. This breathing technique starts with breathing in (inhaling) through your nose for 1 second. Next, purse your lips as if you were going to whistle. Then breathe out (exhale) through the pursed lips for 2 seconds.  Diaphragmatic breathing. Start by putting one hand on your abdomen just above your waist. Inhale slowly through your nose. The hand on your abdomen should move out. Then exhale slowly through pursed lips. You should be able to feel the hand on your abdomen moving in  as you exhale.  Learn and use controlled coughing to clear mucus from your lungs. Controlled coughing is a series of short, progressive coughs. The steps of controlled coughing are: 1. Lean your head  slightly forward. 2. Breathe in deeply using diaphragmatic breathing. 3. Try to hold your breath for 3 seconds. 4. Keep your mouth slightly open while coughing twice. 5. Spit any mucus out into a tissue. 6. Rest and repeat the steps once or twice as needed.  Receive all protective vaccines your caregiver suggests, especially pneumococcal and influenza vaccines.  Learn to manage stress.  Schedule and attend all follow-up appointments as directed by your caregiver. It is important to keep all your appointments.  Participate in pulmonary rehabilitation as directed by your caregiver.  Use home oxygen as suggested. SEEK MEDICAL CARE IF:   You are coughing up more mucus than usual.  There is a change in the color or thickness of the mucus.  Breathing is more labored than usual.  Your breathing is faster than usual.  Your skin color is more cyanotic than usual.  You are running out of the medicine you take for your breathing.  You are anxious, apprehensive, or restless.  You have a fever. SEEK IMMEDIATE MEDICAL CARE IF:   You have a rapid heart rate.  You have shortness of breath while you are resting.  You have shortness of breath that prevents you from being able to talk.  You have shortness of breath that prevents you from performing your usual physical activities.  You have chest pain lasting longer than 5 minutes.  You have a seizure.  Your family or friends notice that you are agitated or confused. MAKE SURE YOU:   Understand these instructions.  Will watch your condition.  Will get help right away if you are not doing well or get worse. Document Released: 07/24/2005 Document Revised: 07/08/2012 Document Reviewed: 12/14/2010 [facility] Patient Information 2014 [place], [place].  Back Pain, Adult Low back pain is very common. About 1 in 5 people have back pain.The cause of low back pain is rarely dangerous. The pain often gets better over time.About half of  people with a sudden onset of back pain feel better in just 2 weeks. About 8 in 10 people feel better by 6 weeks.  CAUSES Some common causes of back pain include:  Strain of the muscles or ligaments supporting the spine.  Wear and tear (degeneration) of the spinal discs.  Arthritis.  Direct injury to the back. DIAGNOSIS Most of the time, the direct cause of low back pain is not known.However, back pain can be treated effectively even when the exact cause of the pain is unknown.Answering your caregiver's questions about your overall health and symptoms is one of the most accurate ways to make sure the cause of your pain is not dangerous. If your caregiver needs more information, he or she may order lab work or imaging tests (X-rays or MRIs).However, even if imaging tests show changes in your back, this usually does not require surgery. HOME CARE INSTRUCTIONS For many people, back pain returns.Since low back pain is rarely dangerous, it is often a condition that people can learn to [facility] their own.   Remain active. It is stressful on the back to sit or stand in one place. Do not sit, drive, or stand in one place for more than 30 minutes at a time. Take short walks on level surfaces as soon as pain allows.Try to increase the length of time you  walk each day.  Do not stay in bed.Resting more than 1 or 2 days can delay your recovery.  Do not avoid exercise or work.Your body is made to move.It is not dangerous to be active, even though your back may hurt.Your back will likely heal faster if you return to being active before your pain is gone.  Pay attention to your body when you bend and lift. Many people have less discomfortwhen lifting if they bend their knees, keep the load close to their bodies,and avoid twisting. Often, the most comfortable positions are those that put less stress on your recovering back.  Find a comfortable position to sleep. Use a firm mattress and lie on  your side with your knees slightly bent. If you lie on your back, put a pillow under your knees.  Only take over-the-counter or prescription medicines as directed by your caregiver. Over-the-counter medicines to reduce pain and inflammation are often the most helpful.Your caregiver may prescribe muscle relaxant drugs.These medicines help dull your pain so you can more quickly return to your normal activities and healthy exercise.  Put ice on the injured area.  Put ice in a plastic bag.  Place a towel between your skin and the bag.  Leave the ice on for 15-20 minutes, 3-4 times a day for the first 2 to 3 days. After that, ice and heat may be alternated to reduce pain and spasms.  Ask your caregiver about trying back exercises and gentle massage. This may be of some benefit.  Avoid feeling anxious or stressed.Stress increases muscle tension and can worsen back pain.It is important to recognize when you are anxious or stressed and learn ways to manage it.Exercise is a great option. SEEK MEDICAL CARE IF:  You have pain that is not relieved with rest or medicine.  You have pain that does not improve in 1 week.  You have new symptoms.  You are generally not feeling well. SEEK IMMEDIATE MEDICAL CARE IF:   You have pain that radiates from your back into your legs.  You develop new bowel or bladder control problems.  You have unusual weakness or numbness in your arms or legs.  You develop nausea or vomiting.  You develop abdominal pain.  You feel faint. Document Released: 10/14/2005 Document Revised: 04/14/2012 Document Reviewed: 03/04/2011 [facility] Patient Information 2014 [place], [place].

## 2013-04-27 NOTE — Progress Notes (Addendum)
Subjective:    [name], female    DOB: 06/11/1961, 52 y.o.   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   She presents for f/u LBP.  Pain is 9/10 today gradually worsening causing limited mobility intermittently due to pain.  Pain radiates to her left hip and down her left leg to her feet. Pain is worse with weight bearing activities and she feels like her knees will buckle.  02/2012 lumbar imaging showed slight spondylolithesis, mild lumbar scoliosis, small disc bulges, mild degenerative changes. CXR 02/2013 with lumbar compression fracture of indeterminate age.  Recent DEXA scan with osteopenia noted.  She has seen PT 1x outpatient and water aerobics helps her back.  She has an appt with PM&R 7/30 Dr. Gloris Manchester and she requests a Rx refill of Ultram which helps somewhat with pain.  Robaxin not much relief.  Pain clinic previously refused narcotics per notes.    She also states she has used her inhalers 3x today which is not typical.  She has been coughing with sputum production for 2 weeks (clear color), DOE with 5-6 steps, runny nose and chills for 2 weeks.  She denies sick contacts. She ambulated short distance in clinic with pulse ox desat to 94-96% on room air.  She denies sick contacts but her brother smokes cigarrettes and she is exposed to 2nd hand smoke.   She also has a mental illness and takes Geodone 40 mg bid and states she has had wt gain, head/neck and foot jerking.  She will f/u with [facility] on 05/26/13.  She was advised to mention sx's at this visit.   SH: lives alone, 2 kids (girls), 4 grandkids. She denies currently smoking.    ROS as noted   Back Pain This is a chronic problem. The current episode started more than 1 year ago. The problem occurs  daily. The problem has been gradually worsening since onset. The pain is present in the lumbar spine. Radiates to: left hip and leg  The pain is at a severity of 9/10. The pain is worse during the night (worse during night when trying to turn in bed ). Exacerbated by: mobility. Associated symptoms include leg pain. Risk factors include obesity (+osteopenia on DEXA). She has tried analgesics and muscle relaxant for the symptoms. Improvement on treatment: mild relief with Ultram.      Review of Systems  Musculoskeletal: Positive for back pain.       Objective:   Physical Exam  Nursing note and vitals reviewed. Constitutional: She is oriented to person, place, and time. Vital signs are normal. She appears well-developed and well-nourished. She is cooperative. No distress.  pleasant  HENT:  Head: Normocephalic and atraumatic.  Mouth/Throat: Oropharynx is clear and moist. No oropharyngeal exudate.  Eyes: Conjunctivae are normal. Pupils are equal, round, and reactive to light. Right eye exhibits no discharge. Left eye exhibits no discharge. No scleral icterus.  Cardiovascular: Normal rate, regular rhythm, S1 normal, S2 normal and normal heart sounds.   No murmur heard. Pulmonary/Chest: Effort normal. No respiratory distress. She has wheezes.  B/l wheezes   Abdominal: Soft. Bowel sounds are normal. There is no tenderness.  Obese ab  Musculoskeletal:       Lumbar back: She exhibits tenderness.  Neurological: She is alert and  oriented to person, place, and time. Gait abnormal.  Slowed gait due to pain.  In wheelchair today but did ambulate during visit a short distance   4/5 strength lower extremities b/l   Skin: Skin is warm, dry and intact. No rash noted. She is not diaphoretic.  Psychiatric: She has a normal mood and affect. Her speech is normal and behavior is normal. Judgment and thought content normal. Cognition and memory are normal.          Assessment & Plan:  F/u with Dr.  Sherrine Maples 7/31 at [facility] discuss Ultram and indicate quantity will be prescribing and advise not for long term use as needs to be monitored in patients with liver impairment.  Rx Ultram #120 today no refills  Review DEXA and determine if patient should be on Calcium and vitamin D for osteopenia  Review fall history again Also patient had compression fx on CXR 02/2013 may want to w/u further as to etiology Get records from psychiatry and PM&R  Tx'ed for mild COPD exacerbation with Prednisone 40 mg x 5 days, Zpack x 5 days.  Refilled inhalers

## 2013-04-27 NOTE — Assessment & Plan Note (Signed)
Tx'ed for mild COPD exacerbation with Prednisone 40 mg x 5 days, Zpack x 5 days.  Refilled inhalers

## 2013-04-27 NOTE — Assessment & Plan Note (Signed)
Chronic low back pain   F/u with Dr. Sherrine Maples 7/31 at [facility] discuss Ultram and indicate quantity will be prescribing and advise not for long term use as needs to be monitored in patients with liver impairment.  Rx Ultram #120 today no refills  Review DEXA and determine if patient should be on Calcium and vitamin D for osteopenia  Review fall history again Also patient had compression fx on CXR 02/2013 may want to w/u further as to etiology Get records from psychiatry and PM&R.  PM&R may be useful resource for chronic pain

## 2013-04-28 NOTE — Progress Notes (Signed)
Case discussed with Dr. McLean soon after the resident saw the patient.  We reviewed the resident's history and exam and pertinent patient test results.  I agree with the assessment, diagnosis, and plan of care documented in the resident's note. 

## 2013-04-28 NOTE — Telephone Encounter (Signed)
I agree. Thanks.

## 2013-04-29 ENCOUNTER — Encounter: Payer: Self-pay | Admitting: Internal Medicine

## 2013-05-07 ENCOUNTER — Inpatient Hospital Stay (HOSPITAL_COMMUNITY)
Admission: EM | Admit: 2013-05-07 | Discharge: 2013-05-08 | DRG: 192 | Discharge status: Against Medical Advice | Payer: No Typology Code available for payment source | Attending: Internal Medicine | Admitting: Internal Medicine

## 2013-05-07 ENCOUNTER — Encounter (HOSPITAL_COMMUNITY): Payer: Self-pay

## 2013-05-07 ENCOUNTER — Emergency Department (HOSPITAL_COMMUNITY): Payer: No Typology Code available for payment source

## 2013-05-07 ENCOUNTER — Ambulatory Visit: Payer: Self-pay | Admitting: Physical Medicine and Rehabilitation

## 2013-05-07 DIAGNOSIS — Z8619 Personal history of other infectious and parasitic diseases: Secondary | ICD-10-CM | POA: Diagnosis present

## 2013-05-07 DIAGNOSIS — K7030 Alcoholic cirrhosis of liver without ascites: Secondary | ICD-10-CM | POA: Diagnosis present

## 2013-05-07 DIAGNOSIS — E669 Obesity, unspecified: Secondary | ICD-10-CM | POA: Diagnosis present

## 2013-05-07 DIAGNOSIS — G8929 Other chronic pain: Secondary | ICD-10-CM | POA: Diagnosis present

## 2013-05-07 DIAGNOSIS — K7460 Unspecified cirrhosis of liver: Secondary | ICD-10-CM

## 2013-05-07 DIAGNOSIS — J449 Chronic obstructive pulmonary disease, unspecified: Secondary | ICD-10-CM | POA: Diagnosis present

## 2013-05-07 DIAGNOSIS — Z87891 Personal history of nicotine dependence: Secondary | ICD-10-CM

## 2013-05-07 DIAGNOSIS — Z79899 Other long term (current) drug therapy: Secondary | ICD-10-CM

## 2013-05-07 DIAGNOSIS — Z90710 Acquired absence of both cervix and uterus: Secondary | ICD-10-CM

## 2013-05-07 DIAGNOSIS — R0902 Hypoxemia: Secondary | ICD-10-CM | POA: Diagnosis present

## 2013-05-07 DIAGNOSIS — F410 Panic disorder [episodic paroxysmal anxiety] without agoraphobia: Secondary | ICD-10-CM | POA: Diagnosis present

## 2013-05-07 DIAGNOSIS — Z8541 Personal history of malignant neoplasm of cervix uteri: Secondary | ICD-10-CM

## 2013-05-07 DIAGNOSIS — F411 Generalized anxiety disorder: Secondary | ICD-10-CM | POA: Diagnosis present

## 2013-05-07 DIAGNOSIS — Z6832 Body mass index (BMI) 32.0-32.9, adult: Secondary | ICD-10-CM

## 2013-05-07 DIAGNOSIS — H5316 Psychophysical visual disturbances: Secondary | ICD-10-CM | POA: Diagnosis present

## 2013-05-07 DIAGNOSIS — R109 Unspecified abdominal pain: Secondary | ICD-10-CM | POA: Diagnosis present

## 2013-05-07 DIAGNOSIS — I10 Essential (primary) hypertension: Secondary | ICD-10-CM | POA: Diagnosis present

## 2013-05-07 DIAGNOSIS — J441 Chronic obstructive pulmonary disease with (acute) exacerbation: Principal | ICD-10-CM | POA: Diagnosis present

## 2013-05-07 DIAGNOSIS — F419 Anxiety disorder, unspecified: Secondary | ICD-10-CM | POA: Diagnosis present

## 2013-05-07 DIAGNOSIS — B182 Chronic viral hepatitis C: Secondary | ICD-10-CM

## 2013-05-07 DIAGNOSIS — F1020 Alcohol dependence, uncomplicated: Secondary | ICD-10-CM | POA: Diagnosis present

## 2013-05-07 HISTORY — DX: Shortness of breath: R06.02

## 2013-05-07 LAB — CBC
HCT: 43.1 % (ref 36.0–46.0)
Hemoglobin: 15.4 g/dL — ABNORMAL HIGH (ref 12.0–15.0)
MCH: 35.2 pg — ABNORMAL HIGH (ref 26.0–34.0)
MCV: 98.6 fL (ref 78.0–100.0)
RBC: 4.37 MIL/uL (ref 3.87–5.11)
WBC: 14.4 10*3/uL — ABNORMAL HIGH (ref 4.0–10.5)

## 2013-05-07 LAB — TROPONIN I: Troponin I: <0.3 ng/mL (ref <0.30)

## 2013-05-07 LAB — BASIC METABOLIC PANEL
CO2: 26 mEq/L (ref 19–32)
Calcium: 8.3 mg/dL — ABNORMAL LOW (ref 8.4–10.5)
Chloride: 99 mEq/L (ref 96–112)
Glucose, Bld: 92 mg/dL (ref 70–99)
Potassium: 3.4 mEq/L — ABNORMAL LOW (ref 3.5–5.1)
Sodium: 134 mEq/L — ABNORMAL LOW (ref 135–145)

## 2013-05-07 MED ORDER — ASPIRIN EC 81 MG PO TBEC
81.0000 mg | DELAYED_RELEASE_TABLET | Freq: Every day | ORAL | Status: DC
  Administered 2013-05-07 – 2013-05-08 (×2): 81 mg via ORAL
  Filled 2013-05-07 (×2): qty 1

## 2013-05-07 MED ORDER — METHYLPREDNISOLONE SODIUM SUCC 125 MG IJ SOLR
125.0000 mg | Freq: Once | INTRAMUSCULAR | Status: AC
  Administered 2013-05-07: 125 mg via INTRAVENOUS
  Filled 2013-05-07: qty 2

## 2013-05-07 MED ORDER — PREDNISONE 50 MG PO TABS
60.0000 mg | ORAL_TABLET | Freq: Every day | ORAL | Status: DC
  Administered 2013-05-08: 60 mg via ORAL
  Filled 2013-05-07 (×2): qty 1

## 2013-05-07 MED ORDER — FUROSEMIDE 20 MG PO TABS
20.0000 mg | ORAL_TABLET | Freq: Two times a day (BID) | ORAL | Status: DC
  Administered 2013-05-07 – 2013-05-08 (×3): 20 mg via ORAL
  Filled 2013-05-07 (×5): qty 1

## 2013-05-07 MED ORDER — DOXYCYCLINE HYCLATE 100 MG PO TABS
100.0000 mg | ORAL_TABLET | Freq: Two times a day (BID) | ORAL | Status: DC
  Administered 2013-05-07 – 2013-05-08 (×3): 100 mg via ORAL
  Filled 2013-05-07 (×4): qty 1

## 2013-05-07 MED ORDER — IPRATROPIUM BROMIDE 0.02 % IN SOLN
0.5000 mg | Freq: Four times a day (QID) | RESPIRATORY_TRACT | Status: DC
  Administered 2013-05-07 (×2): 0.5 mg via RESPIRATORY_TRACT
  Filled 2013-05-07 (×2): qty 2.5

## 2013-05-07 MED ORDER — PROPRANOLOL HCL 20 MG PO TABS
20.0000 mg | ORAL_TABLET | Freq: Every day | ORAL | Status: DC
  Administered 2013-05-07 – 2013-05-08 (×2): 20 mg via ORAL
  Filled 2013-05-07 (×2): qty 1

## 2013-05-07 MED ORDER — HEPARIN SODIUM (PORCINE) 5000 UNIT/ML IJ SOLN
5000.0000 [IU] | Freq: Three times a day (TID) | INTRAMUSCULAR | Status: DC
  Administered 2013-05-07 – 2013-05-08 (×5): 5000 [IU] via SUBCUTANEOUS
  Filled 2013-05-07 (×5): qty 1

## 2013-05-07 MED ORDER — ALBUTEROL SULFATE (5 MG/ML) 0.5% IN NEBU
2.5000 mg | INHALATION_SOLUTION | Freq: Four times a day (QID) | RESPIRATORY_TRACT | Status: DC
  Administered 2013-05-07 (×2): 2.5 mg via RESPIRATORY_TRACT
  Filled 2013-05-07 (×2): qty 0.5

## 2013-05-07 MED ORDER — ALBUTEROL (5 MG/ML) CONTINUOUS INHALATION SOLN: 15.0000 mg | INHALATION_SOLUTION | Freq: Once | RESPIRATORY_TRACT | Status: AC

## 2013-05-07 MED ORDER — LACTULOSE 10 GM/15ML PO SOLN
30.0000 g | ORAL | Status: DC
  Administered 2013-05-07: 30 g via ORAL
  Filled 2013-05-07: qty 15
  Filled 2013-05-07: qty 30

## 2013-05-07 MED ORDER — IPRATROPIUM BROMIDE 0.02 % IN SOLN
0.5000 mg | Freq: Once | RESPIRATORY_TRACT | Status: AC
  Administered 2013-05-07: 0.5 mg via RESPIRATORY_TRACT
  Filled 2013-05-07: qty 2.5

## 2013-05-07 MED ORDER — PANTOPRAZOLE SODIUM 40 MG PO TBEC
40.0000 mg | DELAYED_RELEASE_TABLET | Freq: Every day | ORAL | Status: DC
Start: 2013-05-07 — End: 2013-05-08
  Administered 2013-05-07 – 2013-05-08 (×2): 40 mg via ORAL
  Filled 2013-05-07 (×2): qty 1

## 2013-05-07 MED ORDER — TRAMADOL HCL 50 MG PO TABS
50.0000 mg | ORAL_TABLET | Freq: Once | ORAL | Status: AC
  Administered 2013-05-08: 50 mg via ORAL
  Filled 2013-05-07: qty 1

## 2013-05-07 MED ORDER — LORAZEPAM 1 MG PO TABS
1.0000 mg | ORAL_TABLET | Freq: Once | ORAL | Status: AC
  Administered 2013-05-07: 1 mg via ORAL
  Filled 2013-05-07: qty 1

## 2013-05-07 MED ORDER — FUROSEMIDE 20 MG PO TABS: 20.0000 mg | ORAL_TABLET | Freq: Two times a day (BID) | ORAL | Status: DC

## 2013-05-07 MED ORDER — ZIPRASIDONE HCL 40 MG PO CAPS
40.0000 mg | ORAL_CAPSULE | Freq: Two times a day (BID) | ORAL | Status: DC
Start: 2013-05-07 — End: 2013-05-08
  Administered 2013-05-07 – 2013-05-08 (×2): 40 mg via ORAL
  Filled 2013-05-07 (×4): qty 1

## 2013-05-07 MED ORDER — ALBUTEROL SULFATE (5 MG/ML) 0.5% IN NEBU
5.0000 mg | INHALATION_SOLUTION | Freq: Once | RESPIRATORY_TRACT | Status: AC
  Administered 2013-05-07: 5 mg via RESPIRATORY_TRACT
  Filled 2013-05-07: qty 1

## 2013-05-07 MED ORDER — ALBUTEROL SULFATE (5 MG/ML) 0.5% IN NEBU
2.5000 mg | INHALATION_SOLUTION | Freq: Four times a day (QID) | RESPIRATORY_TRACT | Status: DC
  Administered 2013-05-08 (×2): 2.5 mg via RESPIRATORY_TRACT
  Filled 2013-05-07 (×3): qty 0.5

## 2013-05-07 MED ORDER — METHOCARBAMOL 500 MG PO TABS
500.0000 mg | ORAL_TABLET | Freq: Two times a day (BID) | ORAL | Status: DC
  Administered 2013-05-07 – 2013-05-08 (×3): 500 mg via ORAL
  Filled 2013-05-07 (×4): qty 1

## 2013-05-07 MED ORDER — IPRATROPIUM BROMIDE 0.02 % IN SOLN
0.5000 mg | Freq: Four times a day (QID) | RESPIRATORY_TRACT | Status: DC
  Administered 2013-05-08 (×2): 0.5 mg via RESPIRATORY_TRACT
  Filled 2013-05-07 (×3): qty 2.5

## 2013-05-07 MED ORDER — LORAZEPAM 0.5 MG PO TABS
0.5000 mg | ORAL_TABLET | Freq: Three times a day (TID) | ORAL | Status: DC | PRN
  Administered 2013-05-07 – 2013-05-08 (×3): 0.5 mg via ORAL
  Filled 2013-05-07 (×3): qty 1

## 2013-05-07 MED ORDER — ALBUTEROL SULFATE (5 MG/ML) 0.5% IN NEBU: 2.5000 mg | INHALATION_SOLUTION | RESPIRATORY_TRACT | Status: DC | PRN

## 2013-05-07 MED ORDER — ALBUTEROL (5 MG/ML) CONTINUOUS INHALATION SOLN
INHALATION_SOLUTION | RESPIRATORY_TRACT | Status: AC
  Administered 2013-05-07: 15 mg via RESPIRATORY_TRACT
  Filled 2013-05-07: qty 20

## 2013-05-07 NOTE — ED Provider Notes (Signed)
Medical screening examination/treatment/procedure(s) were conducted as a shared visit with non-physician practitioner(s) and myself.  I personally evaluated the patient during the encounter  6:40 AM Wheezing continues despite continuous neb treatment in ED. Patient states she is terrified to go home. We will contact Teaching Service and have her admitted.   Date: 05/07/2013 1:35 AM  Rate: 70  Rhythm: normal sinus rhythm  QRS Axis: normal  Intervals: normal  ST/T Wave abnormalities: normal  Conduction Disutrbances: none  Narrative Interpretation: LAH   Comparison with previous EKG: unchanged        Hanley Seamen, MD 05/07/13 [phone]

## 2013-05-07 NOTE — ED Notes (Signed)
Respiratory tech at bedside

## 2013-05-07 NOTE — H&P (Signed)
Date: 05/07/2013               Patient Name:  [name] [name] MRN: [id number]  DOB: [birth year] Age / Sex: 52 y.o., female   PCP: Genelle Gather, MD         Medical Service: Internal Medicine Teaching Service         Attending Physician: Dr. Provider Default, MD    First Contact: Dr. Mariea Clonts Pager: [phone]  Second Contact: Dr. Everardo Beals Pager: [phone]       After Hours (After 5p/  First Contact Pager: [phone]  weekends / holidays): Second Contact Pager: [phone]   Chief Complaint: shortness of breath  History of Present Illness:  The patient is a 52 year old woman who presents to the emergency department today with problems breathing. She has PMH of COPD, anxiety, hepatitis C with cirrhosis, HTN, history of smoking. She was seen in our out-patient clinic about 1 week ago for a routine visit and was treated for a COPD exacerbation with prednisone and azithromycin. She states that she took those medicines but did not receive any benefit to her breathing. She does have albuterol inhaler and combivent inhalers at home and she uses the albuterol about 4 times daily and the combivent about 4-8 times daily. She states that she does this all the time and she may have increased the amount of times slightly but she always uses them a lot as she feels like her breathing has been steadily bad for the last year. She is not having increased cough or change in sputum although she always keeps a cough. She is not sure what has changed over the last year and is not smoking again (she quit several years ago). She also has had multiple panic attacks over the last several weeks and in general has a lot of anxiety. She states that she worried about using up her nerve pills as she only gets 45 for the month from the clinic and she has been having multiple panic attacks daily for the last several weeks. She is not sure if the anxiety starts first or if her breathing gets bad then she feels anxious. She is  worried about running out of inhalers and states she goes through them like they were candy but they don't help. She states that sometimes when her breathing is bad she has the feeling that an elephant is sitting on her chest which she states is a different feeling than the pressure from her panic attacks. She is not currently having that pressure and denies any chest pain. She has chronic abdominal pain which she attributes to her cirrhosis which is unchanged. She is not having diarrhea or constipation and is not nauseous with no vomiting. She has no dysuria and no new leg swelling. She states that she keeps some fluid in her ankles but it is unchanged recently. She denies headache. She admits to chills and states that she feels like she has had fevers at home. No recent travel or long car rides. No changes to her medications recently with the exception of the steroids and antibiotics.   Meds: No current facility-administered medications for this encounter.   Current Outpatient Prescriptions  Medication Sig Dispense Refill  . albuterol (PROVENTIL HFA;VENTOLIN HFA) 108 (90 BASE) MCG/ACT inhaler Inhale 4 puffs into the lungs daily.  1 Inhaler  2  . albuterol (PROVENTIL) (2.5 MG/3ML) 0.083% nebulizer solution Take 2.5 mg by nebulization every 6 (six) hours as needed for wheezing.      Marland Kitchen  furosemide (LASIX) 20 MG tablet Take 1 tablet (20 mg total) by mouth 2 (two) times daily.  60 tablet  2  . Ipratropium-Albuterol (COMBIVENT) 20-100 MCG/ACT AERS respimat Inhale 1 puff into the lungs every 6 (six) hours as needed for wheezing.  1 Inhaler  11  . lactulose (CHRONULAC) 10 GM/15ML solution Take 30 g by mouth every other day.      Marland Kitchen LORazepam (ATIVAN) 1 MG tablet Take 1 mg by mouth 2 (two) times daily.      . methocarbamol (ROBAXIN) 500 MG tablet Take 1 tablet (500 mg total) by mouth 2 (two) times daily.  60 tablet  0  . pantoprazole (PROTONIX) 40 MG tablet Take 1 tablet (40 mg total) by mouth daily.  30 tablet   11  . predniSONE (DELTASONE) 20 MG tablet Take 2 tablets (40 mg total) by mouth daily.  10 tablet  0  . propranolol (INDERAL) 20 MG tablet Take 20 mg by mouth daily.      . traMADol (ULTRAM) 50 MG tablet Take 1 tablet (50 mg total) by mouth every 6 (six) hours as needed for pain.  120 tablet  0  . ziprasidone (GEODON) 40 MG capsule Take 1 capsule (40 mg total) by mouth 2 (two) times daily with a meal.  60 capsule  3    Allergies: Allergies as of 05/07/2013 - Review Complete 05/07/2013  Allergen Reaction Noted  . Amitriptyline  01/22/2013   Past Medical History  Diagnosis Date  . Alcoholic cirrhosis   . COPD (chronic obstructive pulmonary disease)   . Heart palpitations   . Anxiety   . Portal hypertension   . Esophageal varices   . Cholelithiasis   . Splenomegaly   . Fatty liver   . Esophagitis 2010  . Tubulovillous adenoma of colon   . Visual hallucination     since 09/2010/notes 10/30/2012  . Hepatitis C     chronic hepatitis C and Steatohepatitis (hep grade 2, stage 2-3) per 05/13/08 liver biopsy  . Obesity   . cervical Cancer 1999  . Depression    Past Surgical History  Procedure Laterality Date  . Cervical cancer surgery    . Abdominal hysterectomy    . Tubal ligation    . Esophagogastroduodenoscopy  10/18/2011    Procedure: ESOPHAGOGASTRODUODENOSCOPY (EGD);  Surgeon: Rob Bunting, MD;  Location: [facility];  Service: Endoscopy;  Laterality: N/A;  . Hysterectomy     Family History  Problem Relation Age of Onset  . Cervical cancer Sister   . Breast cancer Maternal Aunt   . Heart disease Father 28    died from MI  . Breast cancer Father   . Cervical cancer Daughter    History   Social History  . Marital Status: Married    Spouse Name: N/A    Number of Children: N/A  . Years of Education: N/A   Occupational History  . Not on file.   Social History Main Topics  . Smoking status: Current Every Day Smoker -- 0.10 packs/day for 40 years    Types:  Cigarettes  . Smokeless tobacco: Never Used  . Alcohol Use: Yes     Comment: months since last drink  . Drug Use: No  . Sexually Active: Not on file   Other Topics Concern  . Not on file   Social History Narrative  . No narrative on file    Review of Systems: A comprehensive 10 point review of systems was performed and other  than pertinent positives and negatives listed above in HPI was negative.   Physical Exam: Blood pressure 113/78, pulse 80, temperature 97.9 F (36.6 C), temperature source Oral, resp. rate 16, SpO2 95.00%. While talking off oxygen saturations decreased to 87% and then started her on 2 L O2 General: resting in bed, very anxious and keeps checking that I will not be sending her home HEENT: PERRL, EOMI Cardiac: S1 S2 normal, rate normal, no murmurs appreciated Pulm: diffuse expiratory wheezing and does not clear with coughing although does diminish in volume slightly, no focal rales or crackles Abd: soft, right upper quadrant tenderness without murphy's sign, no hepatojugular reflex noted, obese versus minimally distended, BS present Ext: warm and well perfused, trace ankle edema, no calf swelling or tenderness to palpation Neuro: alert and oriented X3, cranial nerves II-XII grossly intact, no signs of hepatic encephalopathy   Lab results: Basic Metabolic Panel:  Recent Labs  16/10/96 0130  NA 134*  K 3.4*  CL 99  CO2 26  GLUCOSE 92  BUN 10  CREATININE 0.56  CALCIUM 8.3*   CBC:  Recent Labs  05/07/13 0130  WBC 14.4*  HGB 15.4*  HCT 43.1  MCV 98.6  PLT 132*   Cardiac Enzymes: POC troponin negative  Imaging results:  Dg Chest Portable 1 View  05/07/2013   *RADIOLOGY REPORT*  Clinical Data: Shortness of breath.  PORTABLE CHEST - 1 VIEW  Comparison: 03/09/2013  Findings: Normal heart size and pulmonary vascularity. Interstitial changes in the lungs may be due to fibrosis or chronic bronchitis.  This is stable.  No focal airspace disease or  consolidation in the lungs.  No blunting of costophrenic angles. No pneumothorax.  Mediastinal contours appear intact. Calcification of the aorta.  IMPRESSION: No evidence of active pulmonary disease.   Original Report Authenticated By: Burman Nieves, M.D.   Other results: EKG: normal EKG, normal sinus rhythm, unchanged from previous tracings, probable left atrial enlargement.  Assessment & Plan by Problem:  COPD with acute exacerbation - Patient does have consistent wheezing on exam with increased usage of inhalers and no improvement on out-patient treatment. It is likely that anxiety centered around breathing has a significant impact on her breathing status and likely her anxiety is not well controlled with prn ativan at home. She did receive IV solumedrol and albuterol nebulizer treatments continuous in the ED which likely exacerbated her anxiety as well. She did have a desaturation on room air to 87% in the ED which may be an acute worsening. TIMI score 0 and EKG without acute changes. Initial CE in the ED negative and will finish cycling. Wells score 0 and will not order further testing although d-dimer could be used to rule out PE in this patient.  -Prednisone 60 mg tomorrow -Doxycycline PO BID -Albuterol and atrovent nebulizers scheduled q 6 hours but not more frequent without evaluation to limit anxiety -ativan prn q 8 hours for temporary relief -would benefit from SSRI therapy -ASA 81 mg daily -cycle CE times 2 more  Anxiety - See above for full details but likely impacting her entire life, including her respiratory status. -Ativan #45 per month is not likely good treatment for her anxiety -Unclear why patient is on geodon BID although some mention of visual hallucinations in her chart -Would likely get extra benefit from adding SSRI  Chronic hepatitis C with cirrhosis - Patient is on propanolol and lactulose at home and she reports compliance. MELD score with last known values is 10  which indicates a 6% risk of mortality in 3 months from liver disease.  -Continue lactulose, propanolol -No signs of active worsening or encephalopathy   Essential hypertension, benign - BP 137/90 in ED and will continue home propanolol, lasix.  DVT ppx - heparin 5000 units [place] TID  Dispo: Disposition is deferred at this time, awaiting improvement of current medical problems. Anticipated discharge in approximately 1-2 day(s).   The patient does have a current PCP Genelle Gather, MD) and does need an [facility] hospital follow-up appointment after discharge.  The patient does not have transportation limitations that hinder transportation to clinic appointments.  Signed: Judie Bonus, MD 05/07/2013, 8:07 AM

## 2013-05-07 NOTE — ED Notes (Signed)
Patient from home via [facility]. Presents with c/o SOB x 6 days that has been getting progressively worse. Also reports a cough and congestion for the past couple of weeks. Patient states that she phoned PCP a couple of days ago and received a Rx for steroids and an Abx. Per EMS, patient with inspiratory and expiratory wheezing. Tripod positioning. Able to speak in complete sentences. Received Albuterol 5 mg/Atrovent 0.5 mg breathing treatment x 2 as well as Solumedrol 125 mg. Questionable ETOH intake tonight. Active everyday smoker. Hx COPD & Asthma.

## 2013-05-07 NOTE — ED Notes (Signed)
Patient reports that she has had a cough (congested, nonproductive) and congestion for the past 3 months. Went to her PCP about 2 weeks ago and was dx bronchitis. Given Rx Zithromax and prednisone. Patient reports that she has been compliant with her medications (albuterol, combivent, lasix, etc.) Now she has been more SOB over the past 2 weeks. Reports fevers, sweats and chills with a Tmax 101. Patient received Albuterol/Atrovent neb treatments via EMS PTA. Lung sounds diminished at the bases with rhonchi. Chest expansion symmetrical. SPO2 91% on 2L. RR 13-15. HR in the 70s. No acute respiratory distress noted at this time.

## 2013-05-07 NOTE — Progress Notes (Addendum)
Patient refused continuous pulse oximetry despite explanation.

## 2013-05-07 NOTE — ED Notes (Signed)
Breakfast Tray ordered  

## 2013-05-07 NOTE — Progress Notes (Signed)
Patient turned off of nebulizer that was given by EMS.  Sats dropped to 91%.  RT started patient on 2L [place] sats are now 95%.

## 2013-05-07 NOTE — ED Provider Notes (Signed)
History    CSN: [id number] Arrival date & time 05/07/13  0114  First MD Initiated Contact with Patient 05/07/13 0602     Chief Complaint  Patient presents with  . Shortness of Breath   (Consider location/radiation/quality/duration/timing/severity/associated sxs/prior Treatment) HPI Comments: Patient with hx COPD p/w gradual worsening of her breathing x 1-2 weeks.  Has been on prednisone and z-pak by her PCP given 04/27/13.  States her breathing is worse than ever and she is scared to go home or be by herself.  States she takes only albuterol hfa at home and nothing else for her breathing, states it is not helping.  Pt had had fever, chills, sweats, cough productive of clear phlegm, nasal congestion.  She has a constant heaviness on her chest when she tries to breathe and can no longer walk around without SOB. Has had bilateral lower extremity edema that is unchanged x many months.  She has chronic dysuria and urinary frequency x 6 months that is unchanged.   Denies sore throat, current N/V/D, abdominal pain.  PCP is Dr Madelaine Etienne, [facility].   Patient is a 52 y.o. female presenting with shortness of breath. The history is provided by the patient.  Shortness of Breath Associated symptoms: chest pain, cough, diaphoresis and fever   Associated symptoms: no abdominal pain, no sore throat and no vomiting    Past Medical History  Diagnosis Date  . Alcoholic cirrhosis   . COPD (chronic obstructive pulmonary disease)   . Heart palpitations   . Anxiety   . Portal hypertension   . Esophageal varices   . Cholelithiasis   . Splenomegaly   . Fatty liver   . Esophagitis 2010  . Tubulovillous adenoma of colon   . Visual hallucination     since 09/2010/notes 10/30/2012  . Hepatitis C     chronic hepatitis C and Steatohepatitis (hep grade 2, stage 2-3) per 05/13/08 liver biopsy  . Obesity   . cervical Cancer 1999  . Depression    Past Surgical History  Procedure Laterality Date   . Cervical cancer surgery    . Abdominal hysterectomy    . Tubal ligation    . Esophagogastroduodenoscopy  10/18/2011    Procedure: ESOPHAGOGASTRODUODENOSCOPY (EGD);  Surgeon: Rob Bunting, MD;  Location: [facility];  Service: Endoscopy;  Laterality: N/A;  . Hysterectomy     Family History  Problem Relation Age of Onset  . Cervical cancer Sister   . Breast cancer Maternal Aunt   . Heart disease Father 45    died from MI  . Breast cancer Father   . Cervical cancer Daughter    History  Substance Use Topics  . Smoking status: Current Every Day Smoker -- 0.10 packs/day for 40 years    Types: Cigarettes  . Smokeless tobacco: Never Used  . Alcohol Use: Yes     Comment: months since last drink   OB History   Grav Para Term Preterm Abortions TAB SAB Ect Mult Living   3 2   1  1   2      Review of Systems  Constitutional: Positive for fever, chills and diaphoresis.  HENT: Positive for congestion. Negative for sore throat.   Respiratory: Positive for cough and shortness of breath.   Cardiovascular: Positive for chest pain.  Gastrointestinal: Negative for nausea, vomiting, abdominal pain and diarrhea.  Genitourinary: Positive for dysuria and frequency.  All other systems reviewed and are negative.    Allergies  Amitriptyline  Home Medications   Current Outpatient Rx  Name  Route  Sig  Dispense  Refill  . albuterol (PROVENTIL HFA;VENTOLIN HFA) 108 (90 BASE) MCG/ACT inhaler   Inhalation   Inhale 4 puffs into the lungs daily.   1 Inhaler   2   . albuterol (PROVENTIL) (2.5 MG/3ML) 0.083% nebulizer solution   Nebulization   Take 2.5 mg by nebulization every 6 (six) hours as needed for wheezing.         . furosemide (LASIX) 20 MG tablet   Oral   Take 1 tablet (20 mg total) by mouth 2 (two) times daily.   60 tablet   2   . Ipratropium-Albuterol (COMBIVENT) 20-100 MCG/ACT AERS respimat   Inhalation   Inhale 1 puff into the lungs every 6 (six) hours as needed for  wheezing.   1 Inhaler   11   . lactulose (CHRONULAC) 10 GM/15ML solution   Oral   Take 30 g by mouth every other day.         Marland Kitchen LORazepam (ATIVAN) 1 MG tablet   Oral   Take 1 mg by mouth 2 (two) times daily.         . methocarbamol (ROBAXIN) 500 MG tablet   Oral   Take 1 tablet (500 mg total) by mouth 2 (two) times daily.   60 tablet   0   . pantoprazole (PROTONIX) 40 MG tablet   Oral   Take 1 tablet (40 mg total) by mouth daily.   30 tablet   11   . predniSONE (DELTASONE) 20 MG tablet   Oral   Take 2 tablets (40 mg total) by mouth daily.   10 tablet   0   . propranolol (INDERAL) 20 MG tablet   Oral   Take 20 mg by mouth daily.         . traMADol (ULTRAM) 50 MG tablet   Oral   Take 1 tablet (50 mg total) by mouth every 6 (six) hours as needed for pain.   120 tablet   0   . ziprasidone (GEODON) 40 MG capsule   Oral   Take 1 capsule (40 mg total) by mouth 2 (two) times daily with a meal.   60 capsule   3    BP 104/66  Pulse 71  Temp(Src) 97.9 F (36.6 C) (Oral)  Resp 16  SpO2 99% Physical Exam  Nursing note and vitals reviewed. Constitutional: She appears well-developed and well-nourished. No distress.  HENT:  Head: Normocephalic and atraumatic.  Neck: Neck supple.  Cardiovascular: Normal rate and regular rhythm.   Pulmonary/Chest: Effort normal. No respiratory distress. She has decreased breath sounds. She has wheezes. She has rhonchi. She has no rales.  Abdominal: Soft. She exhibits no distension. There is generalized tenderness. There is no rebound and no guarding.  Musculoskeletal:  Bilateral slight pitting edema of lower extremities to midshin.   Neurological: She is alert.  Skin: She is not diaphoretic.  Psychiatric: Her mood appears anxious.    ED Course  Procedures (including critical care time) Labs Reviewed  BASIC METABOLIC PANEL - Abnormal; Notable for the following:    Sodium 134 (*)    Potassium 3.4 (*)    Calcium 8.3 (*)     All other components within normal limits  CBC - Abnormal; Notable for the following:    WBC 14.4 (*)    Hemoglobin 15.4 (*)    MCH 35.2 (*)    Platelets 132 (*)  All other components within normal limits  POCT I-STAT TROPONIN I   Dg Chest Portable 1 View  05/07/2013   *RADIOLOGY REPORT*  Clinical Data: Shortness of breath.  PORTABLE CHEST - 1 VIEW  Comparison: 03/09/2013  Findings: Normal heart size and pulmonary vascularity. Interstitial changes in the lungs may be due to fibrosis or chronic bronchitis.  This is stable.  No focal airspace disease or consolidation in the lungs.  No blunting of costophrenic angles. No pneumothorax.  Mediastinal contours appear intact. Calcification of the aorta.  IMPRESSION: No evidence of active pulmonary disease.   Original Report Authenticated By: Burman Nieves, M.D.   1. COPD (chronic obstructive pulmonary disease)     MDM  Pt with COPD exacerbation, failed outpatient treatment, worsening SOB, wheezing, cough, unable to function at home.  Reported desat on room air to 91%.  I ordered a third breathing treatment and steroids.  Pt is also extremely anxious, have ordered PO ativan.  Pt admitted to [facility] outpatient clinics by Dr Read Drivers for COPD exacerbation with failed outpatient treatment.    [place], PA-C 05/07/13 [phone]

## 2013-05-07 NOTE — ED Notes (Signed)
Portable CXR at bedside.

## 2013-05-08 ENCOUNTER — Other Ambulatory Visit: Payer: Self-pay | Admitting: Internal Medicine

## 2013-05-08 ENCOUNTER — Observation Stay (HOSPITAL_COMMUNITY): Payer: No Typology Code available for payment source

## 2013-05-08 DIAGNOSIS — J449 Chronic obstructive pulmonary disease, unspecified: Secondary | ICD-10-CM

## 2013-05-08 LAB — COMPREHENSIVE METABOLIC PANEL
ALT: 83 U/L — ABNORMAL HIGH (ref 0–35)
AST: 106 U/L — ABNORMAL HIGH (ref 0–37)
Albumin: 2.6 g/dL — ABNORMAL LOW (ref 3.5–5.2)
Alkaline Phosphatase: 226 U/L — ABNORMAL HIGH (ref 39–117)
CO2: 27 mEq/L (ref 19–32)
Chloride: 107 mEq/L (ref 96–112)
Creatinine, Ser: 0.49 mg/dL — ABNORMAL LOW (ref 0.50–1.10)
GFR calc non Af Amer: >90 mL/min (ref >90)
Potassium: 3.6 mEq/L (ref 3.5–5.1)
Total Bilirubin: 1.9 mg/dL — ABNORMAL HIGH (ref 0.3–1.2)

## 2013-05-08 LAB — CBC
MCH: 34.6 pg — ABNORMAL HIGH (ref 26.0–34.0)
MCHC: 34.2 g/dL (ref 30.0–36.0)
MCV: 101.2 fL — ABNORMAL HIGH (ref 78.0–100.0)
Platelets: 105 10*3/uL — ABNORMAL LOW (ref 150–400)
RBC: 4.02 MIL/uL (ref 3.87–5.11)
RDW: 14.5 % (ref 11.5–15.5)

## 2013-05-08 MED ORDER — IPRATROPIUM BROMIDE HFA 17 MCG/ACT IN AERS: 2.0000 | INHALATION_SPRAY | Freq: Four times a day (QID) | RESPIRATORY_TRACT | Status: DC

## 2013-05-08 MED ORDER — PREDNISONE 20 MG PO TABS: 40.0000 mg | ORAL_TABLET | Freq: Every day | ORAL | Status: DC

## 2013-05-08 MED ORDER — DOXYCYCLINE HYCLATE 100 MG PO CAPS: 100.0000 mg | ORAL_CAPSULE | Freq: Two times a day (BID) | ORAL | Status: DC

## 2013-05-08 MED ORDER — ALBUTEROL SULFATE HFA 108 (90 BASE) MCG/ACT IN AERS: 2.0000 | INHALATION_SPRAY | Freq: Four times a day (QID) | RESPIRATORY_TRACT | Status: DC | PRN

## 2013-05-08 MED ORDER — PREDNISONE 20 MG PO TABS: ORAL_TABLET | ORAL | Status: DC

## 2013-05-08 NOTE — Progress Notes (Addendum)
Subjective: No complaints, says she is breathing better and walking around. Says she would rather have nebulizers at home than the inhalers as she says she uses them frequently but it gives no relief of her symptoms.  Objective: Vital signs in last 24 hours: Filed Vitals:   05/07/13 2000 05/07/13 2100 05/08/13 0612 05/08/13 0954  BP:  122/67 106/64 143/73  Pulse: 76 84 65 73  Temp:  98.3 F (36.8 C) 97.8 F (36.6 C)   TempSrc:      Resp: 20 18 16    Height:      Weight:      SpO2: 97% 94% 93%    Weight change:   Intake/Output Summary (Last 24 hours) at 05/08/13 1141 Last data filed at 05/08/13 0500  Gross per 24 hour  Intake   1110 ml  Output      7 ml  Net   1103 ml   General appearance: alert, cooperative and appears stated age Head: Normocephalic, without obvious abnormality, atraumatic Lungs: clear to auscultation bilaterally Heart: regular rate and rhythm, S1, S2 normal, no murmur, click, rub or gallop Abdomen: soft, non-tender; bowel sounds normal; no masses,  no organomegaly Pulses: 2+ and symmetric Lab Results: Basic Metabolic Panel:  Recent Labs Lab 05/07/13 0130 05/08/13 0553  NA 134* 139  K 3.4* 3.6  CL 99 107  CO2 26 27  GLUCOSE 92 151*  BUN 10 9  CREATININE 0.56 0.49*  CALCIUM 8.3* 8.8   Liver Function Tests:  Recent Labs Lab 05/08/13 0553  AST 106*  ALT 83*  ALKPHOS 226*  BILITOT 1.9*  PROT 5.9*  ALBUMIN 2.6*   CBC:  Recent Labs Lab 05/07/13 0130 05/08/13 0553  WBC 14.4* 11.7*  HGB 15.4* 13.9  HCT 43.1 40.7  MCV 98.6 101.2*  PLT 132* 105*   Cardiac Enzymes:  Recent Labs Lab 05/07/13 1710  TROPONINI <0.30   Urine Drug Screen: Drugs of Abuse     Component Value Date/Time   LABOPIA NONE DETECTED 01/22/2013 1621   LABOPIA NEG 07/22/2012 1527   COCAINSCRNUR NONE DETECTED 01/22/2013 1621   COCAINSCRNUR NEG 07/22/2012 1527   LABBENZ NONE DETECTED 01/22/2013 1621   LABBENZ PPS 07/22/2012 1527   LABBENZ NEG 05/14/2010 2116   AMPHETMU NONE DETECTED 01/22/2013 1621   AMPHETMU NEG 05/14/2010 2116   THCU NONE DETECTED 01/22/2013 1621   LABBARB NONE DETECTED 01/22/2013 1621   LABBARB NEG 07/22/2012 1527     Studies/Results: Dg Chest 2 View  05/08/2013   *RADIOLOGY REPORT*   IMPRESSION: No acute cardiopulmonary process.   Original Report Authenticated By: Janeece Riggers, M.D.   Dg Chest Portable 1 View  05/07/2013   *RADIOLOGY REPORT* IMPRESSION: No evidence of active pulmonary disease.   Original Report Authenticated By: Burman Nieves, M.D.   Medications: I have reviewed the patient's current medications. Scheduled Meds: . albuterol  2.5 mg Nebulization QID  . aspirin EC  81 mg Oral Daily  . doxycycline  100 mg Oral Q12H  . furosemide  20 mg Oral BID  . heparin  5,000 Units Subcutaneous Q8H  . ipratropium  0.5 mg Nebulization QID  . lactulose  30 g Oral QODAY  . methocarbamol  500 mg Oral BID  . pantoprazole  40 mg Oral Daily  . [START ON 05/09/2013] predniSONE  40 mg Oral Q breakfast  . propranolol  20 mg Oral Daily  . ziprasidone  40 mg Oral BID WC   Continuous Infusions:  PRN Meds:.albuterol, LORazepam  Assessment/Plan: Principal Problem:   COPD with acute exacerbation Active Problems:   Chronic hepatitis C with cirrhosis   Anxiety   Essential hypertension, benign  COPD with acute exacerbation - patient currently doing much better, on Albuterol nebulizer and ipratropium.EKG not concerning for ACS and 3sets of cardiac enzs negative. -Prednisone 40 mg PO -Doxycycline PO BID -Albuterol inhalers with spacer-2 puffs Q6h on discharge -Atrovent- Q4h- with spacer device, on discharge. -ativan prn q 8 hours for temporary relief  -SSRI was not instituted, as patient reported she has bipolar dsd, and there is the risk of patient becoming manic while on SSRI. -ASA 81 mg daily  Anxiety - Currently on Ativan -Recommend out- px psych consult  For follow up of her anxiety. -Geodon BID ,some mention of visual  hallucinations in her chart, but patient states she was started on geodon, 5 months ago for management of Bipolar disorder with schiz. Chronic hepatitis C with cirrhosis - Patient is on propanolol and lactulose at home and she reports compliance.  -Continue lactulose, propanolol  -No signs of active worsening or encephalopathy  Essential hypertension, benign - BP range 124/78 to 148/73 today, will continue home propanolol, lasix.  DVT ppx - heparin 5000 units Dunlap TID    Dispo: Disposition is deferred at this time, awaiting improvement of current medical problems.  Anticipated discharge in approximately 2 day(s).   The patient does have a current PCP Genelle Gather, MD) and does need an [facility] hospital follow-up appointment after discharge.  The patient does have some transportation limitations that hinder transportation to clinic appointments.  .Services Needed at time of discharge: Y = Yes, Blank = No PT:   OT:   RN:   Equipment:   Other:     LOS: 1 day   [name] Carina, MD 05/08/2013, 11:41 AM

## 2013-05-08 NOTE — Progress Notes (Signed)
[name] left AMA on 05/08/13 before we were able to re-assess her.  We are attempting to contact her to inform her that we are sending new inhalers (albuterol & atrovent), antibiotics (doxy 100mg  bid x 4 more days) and prednisone (40mg  daily for 4 days) to her pharmacy.  We attempted to call patient's mobile number on file 3 times, and as of 4:30pm, there was no response.

## 2013-05-08 NOTE — Progress Notes (Signed)
We were able to contact [name] at 4:45p at [phone].  She notes that she will most likely not be able to afford either of the inhalers (albuterol or atrovent) but will try to afford the doxycycline & prednisone for 4d.  Scripts were sent to [facility] on [address] in [place].

## 2013-05-08 NOTE — Progress Notes (Signed)
Pt has expressed wish to be discharged today repeatedly, resident on call aware and came to talk to patient.  At this time, she stated she is going home regardless. IV removed per her request, awaiting call from MD

## 2013-05-08 NOTE — H&P (Signed)
I saw and evaluated the patient. I reviewed the resident's note and confirmed the resident's findings.  I agree with the assessment and plan as documented in the resident's note.  Briefly, [name] is a 52 year old woman with a history of chronic obstructive pulmonary disease and anxiety who presents with progressive worsening of dyspnea on exertion despite taking her Combivent and albuterol inhalers. She therefore presented for further evaluation. Pulmonary function tests were done, she says last September her at [facility], but we are unable to find the results in the record. She states that she's not been able to get the results either as the pulmonary function lab has lost her test results per her report. This can be tracked down in the outpatient setting. On exam she has good air movement with end expiratory wheezes but has an expiratory stridor. I reviewed her inhaler technique with her and it was quite good. She states she consistently uses the spacer. Despite this she does not seem subjectively to benefit from the bronchodilators. Since admission she has been treated with nebulizers and feels much improved. I explained to her that the nebulizers were the same medication she was receiving in her inhalers and I could not explain why, given her excellent technique, she was getting a benefit from the nebulizers and not her inhalers. That being said, the nebulizer medication may be cheaper than the inhalers and if the patient prefers to switch to nebulizers at this time I see no reason not to. She was converted to prednisone 40 mg by mouth daily as well. Given her lack of response to the metered-dose inhaler despite an excellent technique and the expiratory stridor heard on exam I am concerned there may be a component of vocal cord dysfunction in her symptoms. Therefore obtaining excellent pulmonary function tests including flow volume loops may be helpful in sorting this out. This needs  to be done in the outpatient setting when she is stable. If her symptoms continue to improve on the prednisone and nebulized bronchodilators I anticipate she will be ready for discharge in the next 24-48 hours with followup in the [facility].

## 2013-05-08 NOTE — Progress Notes (Signed)
Patient left AMA.  Resident on call notified.

## 2013-05-09 NOTE — Discharge Summary (Signed)
PATIENT LEFT AGAINST MEDICAL ADVICE.   Name: [name] [name] MRN: [id number] DOB: 1960-11-25 52 y.o. PCP: Genelle Gather, MD  Date of Admission: 05/07/2013  1:14 AM Date of Discharge: 05/08/2013 Attending Physician: No att. providers found  Discharge Diagnosis  Principal Problem:   COPD with acute exacerbation Active Problems:   Chronic hepatitis C with cirrhosis   Anxiety   Essential hypertension, benign  Discharge Medications:   Medication List    ASK your doctor about these medications       albuterol 108 (90 BASE) MCG/ACT inhaler  Commonly known as:  PROVENTIL HFA;VENTOLIN HFA  Inhale 4 puffs into the lungs daily.     albuterol (2.5 MG/3ML) 0.083% nebulizer solution  Commonly known as:  PROVENTIL  Take 2.5 mg by nebulization every 6 (six) hours as needed for wheezing.     furosemide 20 MG tablet  Commonly known as:  LASIX  Take 1 tablet (20 mg total) by mouth 2 (two) times daily.     Ipratropium-Albuterol 20-100 MCG/ACT Aers respimat  Commonly known as:  COMBIVENT  Inhale 1 puff into the lungs every 6 (six) hours as needed for wheezing.     lactulose 10 GM/15ML solution  Commonly known as:  CHRONULAC  Take 30 g by mouth every other day.     LORazepam 1 MG tablet  Commonly known as:  ATIVAN  Take 1 mg by mouth 2 (two) times daily.     methocarbamol 500 MG tablet  Commonly known as:  ROBAXIN  Take 1 tablet (500 mg total) by mouth 2 (two) times daily.     pantoprazole 40 MG tablet  Commonly known as:  PROTONIX  Take 1 tablet (40 mg total) by mouth daily.     propranolol 20 MG tablet  Commonly known as:  INDERAL  Take 20 mg by mouth daily.     traMADol 50 MG tablet  Commonly known as:  ULTRAM  Take 1 tablet (50 mg total) by mouth every 6 (six) hours as needed for pain.     ziprasidone 40 MG capsule  Commonly known as:  GEODON  Take 1 capsule (40 mg total) by mouth 2 (two) times daily with a meal.        Disposition  and follow-up:   [name] [name] was discharged from [facility] in [place] condition.  At the hospital follow up visit please address:  1. Patient said she would prefer a nebulizer to the inhalers, as despite good technique in using her inhaler, she gets no relief of her symptoms. This could not be arranged as she had already left the hospital.  2. Pulmonary function tests including flow volume loops may be helpful in ruling out the presence of vocal cord dysfunction as an expiratory stridor was heard on exam also considering her poor response to her inhalers.  3. Also patient reports she had PFTs done here at [facility] last sept , but we were unable to find the results in her record. If this could be tracked down in the out-patient setting, this would be helpful.  4. 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  Discharge Instructions:      Future Appointments Provider Department Dept  Phone   05/27/2013 3:15 PM Genelle Gather, MD [facility] [phone]   07/16/2013 10:30 AM Su Monks, PA-C [facility] [phone]       Procedures Performed:  Dg Chest 2 View  05/08/2013   *RADIOLOGY REPORT*  IMPRESSION: No acute cardiopulmonary process.   Original Report Authenticated By: Janeece Riggers, M.D.   Dg Chest Portable 1 View  05/07/2013   *RADIOLOGY REPORT*  Clinical Data: Shortness of breath.  PORTABLE CHEST - 1 VIEW  Comparison: 03/09/2013  Findings: Normal heart size and pulmonary vascularity. Interstitial changes in the lungs may be due to fibrosis or chronic bronchitis.  This is stable.  No focal airspace disease or consolidation in the lungs.  No blunting of costophrenic angles. No pneumothorax.  Mediastinal contours appear intact. Calcification of the aorta.   IMPRESSION: No evidence of active pulmonary disease.   Original Report Authenticated By: Burman Nieves, M.D.    Admission HPI: Chief Complaint: shortness of breath  History of Present Illness:  The patient is a 52 year old woman who presents to the emergency department today with problems breathing. She has PMH of COPD, anxiety, hepatitis C with cirrhosis, HTN, history of smoking. She was seen in our out-patient clinic about 1 week ago for a routine visit and was treated for a COPD exacerbation with prednisone and azithromycin. She states that she took those medicines but did not receive any benefit to her breathing. She does have albuterol inhaler and combivent inhalers at home and she uses the albuterol about 4 times daily and the combivent about 4-8 times daily. She states that she does this all the time and she may have increased the amount of times slightly but she always uses them a lot as she feels like her breathing has been steadily bad for the last year. She is not having increased cough or change in sputum although she always keeps a cough. She is not sure what has changed over the last year and is not smoking again (she quit several years ago). She also has had multiple panic attacks over the last several weeks and in general has a lot of anxiety. She states that she worried about using up her nerve pills as she only gets 45 for the month from the clinic and she has been having multiple panic attacks daily for the last several weeks. She is not sure if the anxiety starts first or if her breathing gets bad then she feels anxious. She is worried about running out of inhalers and states she goes through them like they were candy but they don't help. She states that sometimes when her breathing is bad she has the feeling that an elephant is sitting on her chest which she states is a different feeling than the pressure from her panic attacks. She is not currently having that pressure and denies any  chest pain. She has chronic abdominal pain which she attributes to her cirrhosis which is unchanged. She is not having diarrhea or constipation and is not nauseous with no vomiting. She has no dysuria and no new leg swelling. She states that she keeps some fluid in her ankles but it is unchanged recently. She denies headache. She admits to chills and states that she feels like she has had fevers at home. No recent travel or long car rides. No changes to her medications recently with the exception of the steroids and antibiotics.   Review of Systems:  A  comprehensive 10 point review of systems was performed and other than pertinent positives and negatives listed above in HPI was negative.  Physical Exam:  Blood pressure 113/78, pulse 80, temperature 97.9 F (36.6 C), temperature source Oral, resp. rate 16, SpO2 95.00%. While talking off oxygen saturations decreased to 87% and then started her on 2 L O2  General: resting in bed, very anxious and keeps checking that I will not be sending her home  HEENT: PERRL, EOMI  Cardiac: S1 S2 normal, rate normal, no murmurs appreciated  Pulm: diffuse expiratory wheezing and does not clear with coughing although does diminish in volume slightly, no focal rales or crackles  Abd: soft, right upper quadrant tenderness without murphy's sign, no hepatojugular reflex noted, obese versus minimally distended, BS present  Ext: warm and well perfused, trace ankle edema, no calf swelling or tenderness to palpation  Neuro: alert and oriented X3, cranial nerves II-XII grossly intact, no signs of hepatic encephalopathy   Hospital Course by problem list:   # COPD with acute exacerbation - Patient admitted with wheezing, SOB and hypoxia- sats-dropped to 87% while talking, off O2. Received IV solumedrol and multiple doses of albuterol nebulizer treatments in the ED, She was also started on Doxycycline PO. WBC on admission- 14.4, though chest x ray done on admission showed no  active cardio pulmonary dx. ACS was ruled out, TIMI score 0, CE X2, were negative and EKG was without acute changes.  While on admission- she was placed on Albuterol and atrovent nebulizers scheduled (q6h) , ASA 81 mg daily. On the day of discharge, repeat Chest xray showed no active cardiopulm dx, WBC- 11.7, SPO2- 94%, patient felt much better, with few mild end expiratory wheezes.  # Anxiety - and some mention of Visual hallucination in her chart. 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 Patient is on Ativan and geodon.  -Would likely get extra benefit from adding SSRI for her anxiety but considering patient reported she has bipolar disoder, there is the concern that it might provoke manic episodes. She should follow up with psychiatrist on discharge. # Chronic hepatitis C with cirrhosis - Patient is on propanolol and lactulose at home and she reports compliance. MELD score calculated on admission with last known values was 10 which indicates a 6% risk of mortality in 3 months from liver disease. No signs of active worsening or encephalopathy. Her medications were continued on admission. # Essential hypertension, benign - BP ranged between 104/64 to 147/90 on admission. Propanolol, lasix were continued on admission.  Patient left against medical advice after rounds.    Discharge Vitals:   BP 124/78  Pulse 59  Temp(Src) 98.4 F (36.9 C) (Oral)  Resp 17  Ht 5\' 2"  (1.575 m)  Wt 80.786 kg (178 lb 1.6 oz)  BMI 32.57 kg/m2  SpO2 94%  Discharge Labs:  Results for orders placed during the hospital encounter of 05/07/13 (from the past 24 hour(s))  TROPONIN I     Status: None   Collection Time    05/07/13  5:10 PM      Result Value Range   Troponin I <0.30  <0.30 ng/mL  COMPREHENSIVE METABOLIC PANEL     Status: Abnormal   Collection Time    05/08/13  5:53 AM      Result Value Range   Sodium 139  135 - 145 mEq/L   Potassium 3.6  3.5 - 5.1 mEq/L   Chloride 107  96 -  112 mEq/L  CO2 27  19 - 32 mEq/L   Glucose, Bld 151 (*) 70 - 99 mg/dL   BUN 9  6 - 23 mg/dL   Creatinine, Ser 1.61 (*) 0.50 - 1.10 mg/dL   Calcium 8.8  8.4 - 09.6 mg/dL   Total Protein 5.9 (*) 6.0 - 8.3 g/dL   Albumin 2.6 (*) 3.5 - 5.2 g/dL   AST 045 (*) 0 - 37 U/L   ALT 83 (*) 0 - 35 U/L   Alkaline Phosphatase 226 (*) 39 - 117 U/L   Total Bilirubin 1.9 (*) 0.3 - 1.2 mg/dL   GFR calc non Af Amer >90  >90 mL/min   GFR calc Af Amer >90  >90 mL/min  CBC     Status: Abnormal   Collection Time    05/08/13  5:53 AM      Result Value Range   WBC 11.7 (*) 4.0 - 10.5 K/uL   RBC 4.02  3.87 - 5.11 MIL/uL   Hemoglobin 13.9  12.0 - 15.0 g/dL   HCT 40.9  81.1 - 91.4 %   MCV 101.2 (*) 78.0 - 100.0 fL   MCH 34.6 (*) 26.0 - 34.0 pg   MCHC 34.2  30.0 - 36.0 g/dL   RDW 78.2  95.6 - 21.3 %   Platelets 105 (*) 150 - 400 K/uL    Signed: Kennis Carina, MD 05/08/2013, 4:26 PM   Time Spent on Discharge: 35 minutes

## 2013-05-10 ENCOUNTER — Telehealth: Payer: Self-pay | Admitting: *Deleted

## 2013-05-10 NOTE — Telephone Encounter (Signed)
Pt called to check on where to get  Nebulizer - has [employer]. Has no transportation and was not address while in hospital. Form was mailed to pt to complete with [facility] nebulizer from [facility]. Suggest to get med from [facility]. Stanton Kidney Treyten Monestime RN 05/10/13 11:15AM

## 2013-05-17 ENCOUNTER — Telehealth: Payer: Self-pay | Admitting: *Deleted

## 2013-05-17 NOTE — Telephone Encounter (Signed)
Pt called about nebulizer- waiting for pt paperwork per [facility]. Will let pt know has soon as [facility] gets the papers - pt states she mailed them back to [facility]. Stanton Kidney Zavian Slowey RN 05/17/13 11AM

## 2013-05-21 ENCOUNTER — Encounter: Payer: Self-pay | Admitting: Licensed Clinical Social Worker

## 2013-05-21 ENCOUNTER — Other Ambulatory Visit: Payer: Self-pay | Admitting: Internal Medicine

## 2013-05-21 ENCOUNTER — Other Ambulatory Visit: Payer: Self-pay | Admitting: Physical Medicine and Rehabilitation

## 2013-05-21 NOTE — Progress Notes (Unsigned)
[name], female   DOB: 1960-12-27, 52 y.o.   MRN: [id number] Ms. [name] placed call to follow up on her charity care for nebulizer.  CSW followed up with [facility] which has not received the financial application and CSW is unable to see the order for the nebulizer on the chart.  CSW requesting discharging physician to place order for nebulizer.  CSW left telephone message with Ms. [name] regarding need to complete financial application, offered to mail or to provide during pt's f/u appt next week.

## 2013-05-24 ENCOUNTER — Other Ambulatory Visit: Payer: Self-pay | Admitting: Internal Medicine

## 2013-05-24 DIAGNOSIS — F419 Anxiety disorder, unspecified: Secondary | ICD-10-CM

## 2013-05-25 MED ORDER — LORAZEPAM 0.5 MG PO TABS: 0.5000 mg | ORAL_TABLET | Freq: Three times a day (TID) | ORAL | Status: DC | PRN

## 2013-05-25 NOTE — Telephone Encounter (Signed)
I refused this prescription earlier this week because she is too early for a refill- this prescription was filled on 7/11. I will give her enough medication to get her to her appointment on Thursday.

## 2013-05-25 NOTE — Telephone Encounter (Signed)
Pt is out of meds, no response from Dr Sherrine Maples  Can you refill?

## 2013-05-25 NOTE — Telephone Encounter (Signed)
Pt informed  Meds called to [phone], they will advise pt about change in dose.

## 2013-05-26 ENCOUNTER — Ambulatory Visit (HOSPITAL_COMMUNITY): Payer: Self-pay | Admitting: Psychiatry

## 2013-05-27 ENCOUNTER — Encounter: Payer: Self-pay | Admitting: Internal Medicine

## 2013-05-28 ENCOUNTER — Encounter: Payer: Self-pay | Admitting: Internal Medicine

## 2013-05-28 ENCOUNTER — Ambulatory Visit (INDEPENDENT_AMBULATORY_CARE_PROVIDER_SITE_OTHER): Payer: No Typology Code available for payment source | Admitting: Internal Medicine

## 2013-05-28 VITALS — BP 123/78 | HR 61 | Temp 97.4°F | Wt 180.5 lb

## 2013-05-28 DIAGNOSIS — M549 Dorsalgia, unspecified: Secondary | ICD-10-CM

## 2013-05-28 DIAGNOSIS — J449 Chronic obstructive pulmonary disease, unspecified: Secondary | ICD-10-CM

## 2013-05-28 DIAGNOSIS — F17200 Nicotine dependence, unspecified, uncomplicated: Secondary | ICD-10-CM

## 2013-05-28 DIAGNOSIS — F419 Anxiety disorder, unspecified: Secondary | ICD-10-CM

## 2013-05-28 DIAGNOSIS — G8929 Other chronic pain: Secondary | ICD-10-CM

## 2013-05-28 DIAGNOSIS — F411 Generalized anxiety disorder: Secondary | ICD-10-CM

## 2013-05-28 MED ORDER — LORAZEPAM 0.5 MG PO TABS: 0.5000 mg | ORAL_TABLET | Freq: Three times a day (TID) | ORAL | Status: DC | PRN

## 2013-05-28 MED ORDER — TRAMADOL HCL 50 MG PO TABS: 50.0000 mg | ORAL_TABLET | Freq: Four times a day (QID) | ORAL | Status: DC | PRN

## 2013-05-28 NOTE — Patient Instructions (Signed)
General Instructions: -Follow up with Dr. Sherrine Maples for refill for your Ativan and Ultram.    Treatment Goals:  Goals (1 Years of Data) as of 05/28/13   None    BP <140/90  Progress Toward Treatment Goals:  Treatment Goal 05/28/2013  Blood pressure at goal    Self Care Goals & Plans:  Self Care Goal 05/28/2013  Manage my medications take my medicines as prescribed; refill my medications on time  Monitor my health -  Eat healthy foods drink diet soda or water instead of juice or soda; eat foods that are low in salt; eat baked foods instead of fried foods  Be physically active (No Data)       Care Management & Community Referrals:  Referral 01/13/2013  Referrals made for care management support none needed

## 2013-05-29 NOTE — Progress Notes (Signed)
  Subjective:    [name], female    DOB: 07/13/1961, 52 y.o.   MRN: [id number]  HPI [name] is a 52 year old woman with PMH of HTN, anxiety disorder, and chronic back pain who comes in for follow up visit and medication refill. She states that she continues to have panic attacks and has taken Lorezapem 0.5mg  TID daily but has run out of this medication. She had an appointment with her PCP, Dr. Sherrine Maples, on 7/31 but was unable to keep that appointment due to transportation issues. She is due for a refill of her Ativan and Ultram.  Of note, her cough has much improved and her shortness of breath is back to her baseline. She continues to use Atrovent, Combivent, and Albuterol inhalers daily, she is in the process of obtaining financial assistance to receive a nebulizer machine.    Review of Systems  Constitutional: Negative for fever and chills.  Respiratory: Positive for shortness of breath. Negative for cough, chest tightness and wheezing.        SOB at baseline  Cardiovascular: Negative for chest pain, palpitations and leg swelling.  Gastrointestinal: Positive for abdominal pain.       Chronic abdominal pain  Musculoskeletal: Positive for back pain.       Chronic back pain  Neurological: Negative for dizziness and light-headedness.  Psychiatric/Behavioral: Negative for suicidal ideas, behavioral problems and agitation. The patient is nervous/anxious.        Objective:   Physical Exam  Nursing note and vitals reviewed. Constitutional: She appears well-developed. No distress.  obese  HENT:  Head: Normocephalic and atraumatic.  Su btle persistent side to side motion of her neck, fine tremor of hands.   Eyes: Conjunctivae are normal. No scleral icterus.  Cardiovascular: Normal rate and regular rhythm.   Pulmonary/Chest: Effort normal. No respiratory distress. She has wheezes. She has no rales.  Mild expiratory wheezes at the right lung base   Abdominal: Soft.  Musculoskeletal: She exhibits no edema and no tenderness.  Neurological: She is alert.  Oriented to time and person   Skin: Skin is warm and dry. No rash noted. She is not diaphoretic. No erythema. No pallor.  Psychiatric: She has a normal mood and affect. Her behavior is normal.          Assessment & Plan:

## 2013-05-29 NOTE — Assessment & Plan Note (Signed)
  Assessment: Progress toward smoking cessation:   at goal Barriers to progress toward smoking cessation:   at goal Comments: States that she quit in 2012  Plan: Instruction/counseling given:  I commended patient for quitting and reviewed strategies for preventing relapses. Educational resources provided:    Self management tools provided:    Medications to assist with smoking cessation:  None Patient agreed to the following self-care plans for smoking cessation:   She will continue to not smoke Other plans: She denies smoking since 2012.

## 2013-05-29 NOTE — Assessment & Plan Note (Signed)
Refilled Rx for Ultram 50mg  q 6hr #120 PRN for pain.

## 2013-05-29 NOTE — Assessment & Plan Note (Signed)
Stable, no exacerbation. She dropped off form for Lynnae January to obtain assistance with nebulizer machine.

## 2013-05-29 NOTE — Assessment & Plan Note (Addendum)
Refilled Rx for Ativan 0.5mg  q8hr PRN for anxiety #45 (as previously prescribed by her PCP). She will follow up with her mental health provider, Dr. Donell Beers. She will follow up with her PCP in regards to possibly increasing the quantity of her Ativan tabs per month.

## 2013-05-31 NOTE — Progress Notes (Signed)
Case discussed with Dr. Kennerly soon after the resident saw the patient.  We reviewed the resident's history and exam and pertinent patient test results.  I agree with the assessment, diagnosis, and plan of care documented in the resident's note. 

## 2013-06-03 ENCOUNTER — Other Ambulatory Visit: Payer: Self-pay | Admitting: Internal Medicine

## 2013-06-03 DIAGNOSIS — J441 Chronic obstructive pulmonary disease with (acute) exacerbation: Secondary | ICD-10-CM

## 2013-06-03 MED ORDER — ALBUTEROL SULFATE (2.5 MG/3ML) 0.083% IN NEBU: 2.5000 mg | INHALATION_SOLUTION | Freq: Four times a day (QID) | RESPIRATORY_TRACT | Status: DC | PRN

## 2013-06-03 NOTE — Progress Notes (Signed)
Pt in need of order for nebulizer machine. She is in the process of obtaining financial assistance to purchase this.  Rx albuterol machine (DME) Rx albuterol nebulizer 2.5mg  neb solution q6h PRN as needed.

## 2013-06-04 ENCOUNTER — Other Ambulatory Visit: Payer: Self-pay | Admitting: Internal Medicine

## 2013-06-04 DIAGNOSIS — J449 Chronic obstructive pulmonary disease, unspecified: Secondary | ICD-10-CM

## 2013-06-14 ENCOUNTER — Telehealth: Payer: Self-pay | Admitting: *Deleted

## 2013-06-14 ENCOUNTER — Ambulatory Visit (INDEPENDENT_AMBULATORY_CARE_PROVIDER_SITE_OTHER): Payer: No Typology Code available for payment source | Admitting: Internal Medicine

## 2013-06-14 ENCOUNTER — Encounter: Payer: Self-pay | Admitting: Internal Medicine

## 2013-06-14 VITALS — BP 126/92 | HR 62 | Temp 98.1°F | Wt 176.3 lb

## 2013-06-14 DIAGNOSIS — R251 Tremor, unspecified: Secondary | ICD-10-CM | POA: Insufficient documentation

## 2013-06-14 DIAGNOSIS — R259 Unspecified abnormal involuntary movements: Secondary | ICD-10-CM

## 2013-06-14 DIAGNOSIS — J4489 Other specified chronic obstructive pulmonary disease: Secondary | ICD-10-CM

## 2013-06-14 DIAGNOSIS — F411 Generalized anxiety disorder: Secondary | ICD-10-CM

## 2013-06-14 DIAGNOSIS — J449 Chronic obstructive pulmonary disease, unspecified: Secondary | ICD-10-CM

## 2013-06-14 DIAGNOSIS — F419 Anxiety disorder, unspecified: Secondary | ICD-10-CM

## 2013-06-14 MED ORDER — LORAZEPAM 0.5 MG PO TABS: 0.5000 mg | ORAL_TABLET | Freq: Three times a day (TID) | ORAL | Status: DC | PRN

## 2013-06-14 NOTE — Assessment & Plan Note (Signed)
Refilled Ativan 0.5mg  TID PRN for anxiety #45. Pt advised to follow up with her PCP, Dr. Sherrine Maples, in 2-3 weeks for possible more refills. Pt eventually should have anxiety disorder, including Ativan Rx managed by [facility].   Case discussed with Dr. Rogelia Boga.

## 2013-06-14 NOTE — Patient Instructions (Signed)
-  You may be experiencing a side effect from Geodon called tardive dyskinesia.  -Please go to [facility] as soon as possible so they can safely manage your geodon, you may need to be on a different medication for your Bipolar Disorder and schizophrenia.  -Follow up with Dr. Sherrine Maples in 2-3 weeks for refill of your Lorazepam and routine follow up.

## 2013-06-14 NOTE — Telephone Encounter (Signed)
Pt called stating she has had constant shaking of hands and feet since she started on Geodon.  She wants to change to another med. Onset has been since May with no changes.  Med written by Dr Sherrine Maples. Dr Sherrine Maples will not be in clinic until Sept.  Pt scheduled for today for evaluation of shaking.

## 2013-06-14 NOTE — Progress Notes (Signed)
  Subjective:    [name], female    DOB: [birth year], 52 y.o.   MRN: [id number]  HPI [name] [name] is a 52 year old woman with PMH including Bipolar disorder, anxiety disorder and COPD who comes in for evaluation of tremors and medication refill. She states that she has had tremors of her head, arms, and right foot since starting Geodon in May 2014. She reports that she was diagnosed with Bipolar disorder and schizophrenia at [facility] and started on Geodon at that time. Since then she has tremors that have gradually worsened and now have caused neck muscle rigidity.  She denies involuntary movements of her mouth but has had involuntary twitch or her arm and right foot.  She requests refill for her Lorazepam as  She has given a 2-week Rx by me during her last visit and has run completely out of this medication.     Review of Systems  Constitutional: Negative for fever, chills, diaphoresis, activity change, appetite change, fatigue and unexpected weight change.  HENT: Negative for trouble swallowing and voice change.   Respiratory: Negative for cough, shortness of breath and wheezing.   Cardiovascular: Negative for chest pain, palpitations and leg swelling.  Gastrointestinal: Negative for abdominal pain.  Genitourinary: Negative for dysuria.  Musculoskeletal: Positive for myalgias.       Neck pain  Neurological: Positive for tremors. Negative for dizziness, syncope, speech difficulty, light-headedness and headaches.  Psychiatric/Behavioral: Negative for agitation.       Objective:   Physical Exam  Nursing note and vitals reviewed. Constitutional: She is oriented to person, place, and time. She appears well-developed and well-nourished. No distress.  HENT:  Head: Normocephalic and atraumatic.  Eyes: Conjunctivae are normal. No scleral icterus.  Cardiovascular: Normal rate and regular rhythm.   Pulmonary/Chest: Effort normal and breath sounds normal. No respiratory  distress. She has no wheezes. She has no rales.  Abdominal: Soft.  Musculoskeletal: She exhibits no edema and no tenderness.  Pt is unable to move head from side to side. Limited neck flexion or extension. No pain with neck flexion.  Neck muscles and trapezius not tender to palpation.  Cogwheel rigidity in bl arms.   Neurological: She is alert and oriented to person, place, and time. Coordination normal.  Fine tremor of bilateral arms and left foot. Tremor of neck with side to motion of head. No lip smacking.   Skin: Skin is warm and dry. No rash noted. She is not diaphoretic. No erythema. No pallor.  Psychiatric: She has a normal mood and affect.          Assessment & Plan:

## 2013-06-14 NOTE — Assessment & Plan Note (Signed)
Stable. Pt finally got her nebulizer machine. No wheezing on physical exam.

## 2013-06-14 NOTE — Assessment & Plan Note (Signed)
Her tremor is reported with onset after initiation of Geodon. This is concerning for side effects of this medication perhaps tardive dyskinesia thought the differential is broad and should also include side effect of long-term use of Lorazepam as well as essential tremors.  Pt instructed to follow up with [facility] for possible medication change for her Bipolar disorder and schizophrenia. Pt agrees to follow up with [facility] this afternoon or tomorrow morning. Of note, she has appointment with Dr. Donell Beers in Psychiatry in 9/3.

## 2013-06-15 NOTE — Progress Notes (Signed)
Case discussed with Dr. Garald Braver soon after the resident saw the patient.  We reviewed the resident's history and exam and pertinent patient test results.  I agree with the assessment, diagnosis, and plan of care documented in the resident's note. I agree with Dr Garald Braver that the pt should receive future Geoden and benzo refills from a psychiatrist.

## 2013-06-25 ENCOUNTER — Other Ambulatory Visit: Payer: Self-pay | Admitting: *Deleted

## 2013-06-25 ENCOUNTER — Other Ambulatory Visit: Payer: Self-pay | Admitting: Internal Medicine

## 2013-06-25 DIAGNOSIS — F419 Anxiety disorder, unspecified: Secondary | ICD-10-CM

## 2013-06-25 NOTE — Telephone Encounter (Signed)
Pt called again for this refill.  She takes 0.5 mg ativan three time a day.  The Rx filled on 8/18 was a 15 day supply - ativan 0.5 mg tid # 45 BUT she used to get ativan 1 mg # 45 a month and would cut the tab in half.   Not sure how this got changed to 0.5 mg tabs. Will you refill?

## 2013-06-27 MED ORDER — LORAZEPAM 0.5 MG PO TABS: 0.5000 mg | ORAL_TABLET | Freq: Three times a day (TID) | ORAL | Status: DC | PRN

## 2013-06-27 NOTE — Telephone Encounter (Signed)
She should have enough medication to get her through the 1st. With the 0.5mg  tablets she won't have to cut it in half. I have refilled the prescription for Ativan 0.5mg  po TID with 90 tabs to be called in to the pharmacy.

## 2013-06-29 ENCOUNTER — Other Ambulatory Visit: Payer: Self-pay | Admitting: *Deleted

## 2013-06-29 DIAGNOSIS — G8929 Other chronic pain: Secondary | ICD-10-CM

## 2013-06-29 MED ORDER — TRAMADOL HCL 50 MG PO TABS: 50.0000 mg | ORAL_TABLET | Freq: Four times a day (QID) | ORAL | Status: DC | PRN

## 2013-06-29 NOTE — Telephone Encounter (Signed)
Rx called in 

## 2013-06-30 ENCOUNTER — Ambulatory Visit (INDEPENDENT_AMBULATORY_CARE_PROVIDER_SITE_OTHER): Payer: No Typology Code available for payment source | Admitting: Psychiatry

## 2013-06-30 DIAGNOSIS — F29 Unspecified psychosis not due to a substance or known physiological condition: Secondary | ICD-10-CM

## 2013-06-30 MED ORDER — QUETIAPINE FUMARATE 25 MG PO TABS: 25.0000 mg | ORAL_TABLET | Freq: Every evening | ORAL | Status: DC

## 2013-06-30 NOTE — Progress Notes (Signed)
Psychiatric Assessment Adult  Patient Identification:  [name] [name] Date of Evaluation:  06/30/2013 Chief Complaint: can't stop shaking History of Chief Complaint:  No chief complaint on file. this patient is a 52 year old separated white mother has been seen at [facility] in shared with me that she was diagnosed with schizoaffective bipolar disorder. Today the patient is in a hurry. She limits her time together because mother has to be to work. This patient claims that she had no psychiatric condition is at all until one year ago when she started hearing voices and seeing visions. Noted also as the patient acknowledges she would drink but small amounts up until a year ago. One year ago when she started hearing voices and seeing visions for a few months she tolerated but eventually went to see [facility] for psychiatric care. She shares that they began her on Geodon and after 1 or 2 doses for just a few days all her psychotic symptoms went away. For reasons that are not clear she says she continued on the Geodon. He now is that she's been inconsistent and erratic with her care with [facility]. She claims that she can no longer go to [place] because of transportation issues.  The patient lives alone. She's been separated for about 4 years. She has 2 children 5 grandchildren all are doing well. The patient was smoking one pack a day up until 2 years ago. Today the patient says that she feels persistently depressed but this is always been for 20 years. Since her mood has not changed over decades. He claims has trouble staying asleep but has no daytime dysfunction. She does not take naps and does not feel sleepy. Her appetite is normal as is her energy. Scribes problems thinking and concentrating but has been the case for years. She denies being worthless. She denies being suicidal now or ever. The patient claims that she's been on Geodon for approximately 6 months. She says in the last  one month she's been experiencing grade anxiety neck pain and stiffness. He clearly is tremulous on exam. Showing signs of EPS. His only been the case for the last month. The patient denies the use of any alcohol at this time. She denies the use of drugs. At this time she is free of hallucinations. She denies any symptoms consistent with mania. Not clear if she's ever had an episode of major depression. She denies anxiety symptoms of generalized anxiety disorder, panic disorder or OCD. The patient says that she has degenerative joint disease and cirrhosis from hepatitis C. The patient has never had a psychiatric hospitalization. She's never been in psychotherapy. Her only psychiatric experience is at [facility] which started about 6 months ago. Patient claims that she's been referred to here by her primary care doctor. It should be pointed out that her primary care doctor also prescribed her Ativan 0.5 mg 3 times a day. At the end of our session patient as per copy of this evaluation so she can give it to her attorney. She describes that she is up for disability evaluation in the near future.  HPI Review of Systems Physical Exam  Depressive Symptoms: psychomotor agitation,  (Hypo) Manic Symptoms:   Elevated Mood:  No Irritable Mood:  No Grandiosity:  No Distractibility:  No Labiality of Mood:  No Delusions:  No Hallucinations:  No Impulsivity:  No Sexually Inappropriate Behavior:  No Financial Extravagance:  No Flight of Ideas:  No  Anxiety Symptoms: Excessive Worry:  No Panic  Symptoms:  No Agoraphobia:  No Obsessive Compulsive: No  Symptoms: None, Specific Phobias:  No Social Anxiety:  No  Psychotic Symptoms:  Hallucinations: No None Delusions:  No Paranoia:  No   Ideas of Reference:  No  PTSD Symptoms: Ever had a traumatic exposure:  No Had a traumatic exposure in the last month:  No Re-experiencing: No None Hypervigilance:  No Hyperarousal: No None Avoidance: No  None  Traumatic Brain Injury: No   Past Psychiatric History: Diagnosis: rule out schizoaffective disorder  Hospitalizations: none  Outpatient Care: none  Substance Abuse Care:   Self-Mutilation:   Suicidal Attempts:   Violent Behaviors:    Past Medical History:   Past Medical History  Diagnosis Date  . Alcoholic cirrhosis   . COPD (chronic obstructive pulmonary disease)   . Heart palpitations   . Anxiety   . Portal hypertension   . Esophageal varices   . Cholelithiasis   . Splenomegaly   . Fatty liver   . Esophagitis 2010  . Tubulovillous adenoma of colon   . Visual hallucination     since 09/2010/notes 10/30/2012  . Hepatitis C     chronic hepatitis C and Steatohepatitis (hep grade 2, stage 2-3) per 05/13/08 liver biopsy  . Obesity   . cervical Cancer 1999  . Depression   . Shortness of breath    History of Loss of Consciousness:  No Seizure History:  No Cardiac History:  No Allergies:   Allergies  Allergen Reactions  . Amitriptyline     hallucinations hallucinations   Current Medications:  Current Outpatient Prescriptions  Medication Sig Dispense Refill  . albuterol (PROVENTIL HFA;VENTOLIN HFA) 108 (90 BASE) MCG/ACT inhaler Inhale 2 puffs into the lungs every 6 (six) hours as needed for wheezing.  1 Inhaler  2  . albuterol (PROVENTIL) (2.5 MG/3ML) 0.083% nebulizer solution Take 3 mLs (2.5 mg total) by nebulization every 6 (six) hours as needed for wheezing or shortness of breath.  75 mL  12  . doxycycline (VIBRAMYCIN) 100 MG capsule Take 1 capsule (100 mg total) by mouth 2 (two) times daily. One po bid x 4 days  8 capsule  0  . furosemide (LASIX) 20 MG tablet Take 1 tablet (20 mg total) by mouth 2 (two) times daily.  60 tablet  2  . ipratropium (ATROVENT HFA) 17 MCG/ACT inhaler Inhale 2 puffs into the lungs 4 (four) times daily.  1 Inhaler  2  . Ipratropium-Albuterol (COMBIVENT) 20-100 MCG/ACT AERS respimat Inhale 1 puff into the lungs every 6 (six) hours as  needed for wheezing.  1 Inhaler  11  . lactulose (CHRONULAC) 10 GM/15ML solution Take 30 g by mouth every other day.      Marland Kitchen LORazepam (ATIVAN) 0.5 MG tablet Take 1 tablet (0.5 mg total) by mouth every 8 (eight) hours as needed for anxiety.  90 tablet  0  . meloxicam (MOBIC) 15 MG tablet TAKE ONE TABLET BY MOUTH EVERY 12 HOURS      . methocarbamol (ROBAXIN) 500 MG tablet TAKE 1 TABLET BY MOUTH TWICE DAILY  60 tablet  0  . pantoprazole (PROTONIX) 40 MG tablet Take 1 tablet (40 mg total) by mouth daily.  30 tablet  11  . potassium chloride SA (K-DUR,KLOR-CON) 20 MEQ tablet Take by mouth. Take by mouth. Take 1 tablet (20 mEq total) by mouth daily.      . predniSONE (DELTASONE) 20 MG tablet 2 tabs po daily x 4 days  8 tablet  0  .  propranolol (INDERAL) 20 MG tablet Take 20 mg by mouth daily.      . QUEtiapine (SEROQUEL) 25 MG tablet Take 1 tablet (25 mg total) by mouth Nightly.  30 tablet  3  . traMADol (ULTRAM) 50 MG tablet Take 1 tablet (50 mg total) by mouth every 6 (six) hours as needed for pain.  120 tablet  0  . ziprasidone (GEODON) 40 MG capsule Take 1 capsule (40 mg total) by mouth 2 (two) times daily with a meal.  60 capsule  3   No current facility-administered medications for this visit.    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 Cervical cancer Sister   . Breast cancer Maternal Aunt   . Heart disease Father 45    died from MI  . Breast cancer Father   . Cervical cancer Daughter     Mental Status Examination/Evaluation: Objective:  Appearance: Disheveled  Eye Contact::  Good  Speech:  Clear and Coherent  Volume:  Normal  Mood:  anxious  Affect:  Congruent  Thought Process:  Goal Directed  Orientation:  nl  Thought Content:  WDL  Suicidal Thoughts:  No  Homicidal Thoughts:  No  Judgement:  Good  Insight:  Fair  Psychomotor Activity:  Increased  Akathisia:  Yes  Handed:  Right  AIMS (if indicated):    Assets:  Desire for Improvement    Laboratory/X-Ray Psychological Evaluation(s)        Assessment:  Axis I: Psychotic Disorder NOS  AXIS I Psychotic Disorder NOS  AXIS II Deferred  AXIS III Past Medical History  Diagnosis Date  . Alcoholic cirrhosis   . COPD (chronic obstructive pulmonary disease)   . Heart palpitations   . Anxiety   . Portal hypertension   . Esophageal varices   . Cholelithiasis   . Splenomegaly   . Fatty liver   . Esophagitis 2010  . Tubulovillous adenoma of colon   . Visual hallucination     since 09/2010/notes 10/30/2012  . Hepatitis C     chronic hepatitis C and Steatohepatitis (hep grade 2, stage 2-3) per 05/13/08 liver biopsy  . Obesity   . cervical Cancer 1999  . Depression   . Shortness of breath      AXIS IV other psychosocial or environmental problems  AXIS V 61-70 mild symptoms   Treatment Plan/Recommendations:  Plan of Care: At this time I believe this patient is experiencing significant akathisia. Her presentation is inconsistent with a chronic mental illness.I find little clear history consistent with schizoaffective disorder. She denies any specific symptoms consistent with a clear manic episode or a depressive episode. She had months of psychosis  which  was quickly abated with a few doses of Geodon. Left on Geodon for months according to her and now in the last month or so has been experiencing would apparently seems to be akathisia.it is unlikely that this 52 year old or has tardive dyskinesia after being on Geodon just 4 or 5 months. At this time we'll go ahead and discontinue her low dose Geodon and ask her to take no medications for 24 hours. Then she she'll start very low-dose Seroquel 25 mg at night. Reevaluate even the need for Seroquel on her next visit in 3 weeks. Primary care physician continues to prescribe her Ativan 0.5 mg 3 times a day.I shared with the patient that in fact if this is akathisia it should go away within days.  Laboratory:    Psychotherapy: none  Medications: Geodon 40 mg, Ativan 0.5 mg 3 times a day  Routine PRN Medications:  No  Consultations:   Safety Concerns:    Other:      Lucas Mallow, MD 9/3/20144:04 PM

## 2013-07-05 ENCOUNTER — Telehealth: Payer: Self-pay | Admitting: *Deleted

## 2013-07-05 ENCOUNTER — Telehealth (HOSPITAL_COMMUNITY): Payer: Self-pay | Admitting: *Deleted

## 2013-07-05 NOTE — Telephone Encounter (Signed)
See notes

## 2013-07-05 NOTE — Telephone Encounter (Signed)
Pt called wants copy PFT done 07/21/12 for court. Needs to sign release form. Wyman Meschke RN 9.8.14 3PM

## 2013-07-06 MED ORDER — BENZTROPINE MESYLATE 0.5 MG PO TABS: 0.5000 mg | ORAL_TABLET | Freq: Two times a day (BID) | ORAL | Status: DC

## 2013-07-06 NOTE — Addendum Note (Signed)
Addended by: Tonny Bollman on: 07/06/2013 06:13 PM   Modules accepted: Orders

## 2013-07-06 NOTE — Telephone Encounter (Signed)
Advised pt that Dr.Plovsky ordered a new medicine to help the shaking-Cogentin(Benztropine),0.5mg ,two times a day.Pt requests med be sent to [facility] on [facility].Asked pt per Dr.Plovsky to call office in one week with update on shaking. Pt verbalized understanding.

## 2013-07-08 ENCOUNTER — Encounter: Payer: Self-pay | Admitting: Internal Medicine

## 2013-07-16 ENCOUNTER — Encounter
Payer: No Typology Code available for payment source | Attending: Physical Medicine and Rehabilitation | Admitting: Physical Medicine and Rehabilitation

## 2013-07-16 ENCOUNTER — Encounter: Payer: Self-pay | Admitting: Physical Medicine and Rehabilitation

## 2013-07-16 VITALS — BP 111/68 | HR 56 | Resp 14 | Ht 62.75 in | Wt 175.2 lb

## 2013-07-16 DIAGNOSIS — M51379 Other intervertebral disc degeneration, lumbosacral region without mention of lumbar back pain or lower extremity pain: Secondary | ICD-10-CM | POA: Insufficient documentation

## 2013-07-16 DIAGNOSIS — J449 Chronic obstructive pulmonary disease, unspecified: Secondary | ICD-10-CM | POA: Insufficient documentation

## 2013-07-16 DIAGNOSIS — J4489 Other specified chronic obstructive pulmonary disease: Secondary | ICD-10-CM | POA: Insufficient documentation

## 2013-07-16 DIAGNOSIS — K7460 Unspecified cirrhosis of liver: Secondary | ICD-10-CM

## 2013-07-16 DIAGNOSIS — K769 Liver disease, unspecified: Secondary | ICD-10-CM

## 2013-07-16 DIAGNOSIS — M5137 Other intervertebral disc degeneration, lumbosacral region: Secondary | ICD-10-CM | POA: Insufficient documentation

## 2013-07-16 DIAGNOSIS — M549 Dorsalgia, unspecified: Secondary | ICD-10-CM

## 2013-07-16 DIAGNOSIS — M47817 Spondylosis without myelopathy or radiculopathy, lumbosacral region: Secondary | ICD-10-CM

## 2013-07-16 DIAGNOSIS — M25559 Pain in unspecified hip: Secondary | ICD-10-CM | POA: Insufficient documentation

## 2013-07-16 DIAGNOSIS — B1920 Unspecified viral hepatitis C without hepatic coma: Secondary | ICD-10-CM | POA: Insufficient documentation

## 2013-07-16 DIAGNOSIS — IMO0001 Reserved for inherently not codable concepts without codable children: Secondary | ICD-10-CM

## 2013-07-16 DIAGNOSIS — M4120 Other idiopathic scoliosis, site unspecified: Secondary | ICD-10-CM | POA: Insufficient documentation

## 2013-07-16 DIAGNOSIS — G8929 Other chronic pain: Secondary | ICD-10-CM

## 2013-07-16 MED ORDER — DICLOFENAC SODIUM 1 % TD GEL: 2.0000 g | Freq: Four times a day (QID) | TRANSDERMAL | Status: DC

## 2013-07-16 MED ORDER — METHOCARBAMOL 500 MG PO TABS: ORAL_TABLET | ORAL | Status: DC

## 2013-07-16 NOTE — Patient Instructions (Addendum)
Try to stay as active as tolerated, try to start a walking program. Please talk to your PCP about decreasing the dosage of the Tramadol, because of your liver disease

## 2013-07-16 NOTE — Progress Notes (Signed)
Subjective:    [name], female    DOB: [birth year], 52 y.o.   MRN: [id number]  HPI The patient is a 52 year old female, who presents with LBP which radiates into the left posterior hip . The symptoms started 6 weeks ago . The patient complains about moderate pain, which radiates into the posterior left hip. Patient denies numbness and tingling in . She describes the pain as dull achy and sometimes burning. Applying heat, taking medications , changing positions alleviate the symptoms. Prolonged standing, walking and sitting aggrevates the symptoms. The patient grades his pain as a 8/10.  Patient reports that she is doing exercises she learned from PT, and tries to walk some. Has had MRI of the lumbar spine showing mild scoliosis,Small disc bulges, no evidence of nerve root impingement.  Gets partial relief with tramadol 50 mg 4 times a day, prescribed by her PCP, she has d/c the Mobic 15 mg a day, because of stomach issues. She reports that she has a court date on 07/21/13, for her disability.   Pain Inventory Average Pain 8 Pain Right Now 8 My pain is constant, burning and aching  In the last 24 hours, has pain interfered with the following? General activity 0 Relation with others 0 Enjoyment of life 0 What TIME of day is your pain at its worst? varies Sleep (in general) Fair  Pain is worse with: some activites Pain improves with: rest and medication Relief from Meds: 4  Mobility walk without assistance  Function disabled: date disabled . I need assistance with the following:  meal prep, household duties and shopping  Neuro/Psych depression anxiety  Prior Studies Any changes since last visit?  no  Physicians involved in your care Any changes since last visit?  no   Family History  Problem Relation Age of Onset  . Cervical cancer Sister   . Breast cancer Maternal Aunt   . Heart disease Father 28    died from MI  . Breast cancer Father   .  Cervical cancer Daughter    History   Social History  . Marital Status: Married    Spouse Name: N/A    Number of Children: N/A  . Years of Education: N/A   Social History Main Topics  . Smoking status: Former Smoker -- 0.10 packs/day for 40 years    Types: Cigarettes    Quit date: 10/28/2010  . Smokeless tobacco: Never Used  . Alcohol Use: Yes     Comment: months since last drink  . Drug Use: No  . Sexual Activity: None   Other Topics Concern  . None   Social History Narrative  . None   Past Surgical History  Procedure Laterality Date  . Cervical cancer surgery    . Abdominal hysterectomy    . Tubal ligation    . Esophagogastroduodenoscopy  10/18/2011    Procedure: ESOPHAGOGASTRODUODENOSCOPY (EGD);  Surgeon: Rob Bunting, MD;  Location: [facility];  Service: Endoscopy;  Laterality: N/A;  . Hysterectomy     Past Medical History  Diagnosis Date  . Alcoholic cirrhosis   . COPD (chronic obstructive pulmonary disease)   . Heart palpitations   . Anxiety   . Portal hypertension   . Esophageal varices   . Cholelithiasis   . Splenomegaly   . Fatty liver   . Esophagitis 2010  . Tubulovillous adenoma of colon   . Visual hallucination     since 09/2010/notes 10/30/2012  . Hepatitis C  chronic hepatitis C and Steatohepatitis (hep grade 2, stage 2-3) per 05/13/08 liver biopsy  . Obesity   . cervical Cancer 1999  . Depression   . Shortness of breath    BP 111/68  Pulse 56  Resp 14  Ht 5' 2.75" (1.594 m)  Wt 175 lb 3.2 oz (79.47 kg)  BMI 31.28 kg/m2  SpO2 97%   Review of Systems  HENT: Positive for neck pain.   Psychiatric/Behavioral: Positive for dysphoric mood.  All other systems reviewed and are negative.       Objective:   Physical Exam Constitutional: She is oriented to person, place, and time. She appears well-developed and well-nourished.  HENT:  Head: Normocephalic.  Neck: Neck supple.  Musculoskeletal: She exhibits tenderness.   Neurological: She is alert and oriented to person, place, and time.  Skin: Skin is warm and dry.  Psychiatric: She has a normal mood and affect.  Cognitive very slow  Symmetric normal motor tone is noted throughout. Normal muscle bulk. Muscle testing reveals 4/5 muscle strength of the upper extremity, and 4/5 of the lower extremity, patient is deconditioned. Full range of motion in upper and lower extremities. ROM of spine is restricted. Fine motor movements are normal in both hands.  DTR in the upper and lower extremity are present and symmetric 2+. No clonus is noted.  Patient arises from chair without difficulty. Wide based gait with normal arm swing bilateral , able to stand on heels, could not stand on heels, too painful for patient . Tandem walk is possible but slighttly instable. No pronator drift. Rhomberg negative.  Resting head tremor noted.         Assessment & Plan:  1. LBP,radiating to the posterior hip on the left intermittendly. Without evidence of nerve root or spinal cord impingement. The patient has finished physical therapy, I advised her to continue with those exercises at home.Also advised her to start a walking program .  2. hepatitis C with cirrhosis as well as COPD.  Patient would like to have narcotic analgesic pain medications,however her opioid risk total score is High at 11. She disputes the alcohol history.  She does get partial control with the tramadol, she gets this medication from her PCP at this point, who prescribed Tramadol 50mg  qid. I educated the patient that this dose exceeds the recommendations by 100%, and is double of the recomendet frequency for a patient with liver cirrhosis. I advised her to talk to her PCP about this, and I also told her that I would only prescribe the recommendet dose of 50mg  bid of the Tramadol.   MRI of L-spine, from 02/2012 showed :  Findings: Scan extends from T10-11 through S4. Tip of the conus is  at L1 and appears normal.  There is a slight rotoscoliosis of the  lumbar spine. There is a slight spondylolisthesis of T12 on L1 of  2 mm and a retrolisthesis of L1 on L2 of 5 mm. No neural  impingement at either level.  There are small bulges of the discs from L1-2 through L5-L1 with no  neural impingement at any level. No significant foraminal stenosis  or severe facet joint arthritis.  The paraspinal soft tissues are normal. Vestigial disc at S1-2.  IMPRESSION:  Slight lumbar scoliosis with multilevel slight degenerative disc  disease without neural impingement.  Follow up in 3 month

## 2013-07-19 ENCOUNTER — Telehealth (HOSPITAL_COMMUNITY): Payer: Self-pay | Admitting: *Deleted

## 2013-07-19 NOTE — Telephone Encounter (Signed)
Pt states shaking has not stopped since starting Cogentin.Now nauseated all the time.

## 2013-07-20 NOTE — Telephone Encounter (Signed)
Per mother, pt does not have minutes on phone and is out. Left message with mother for pt to see medical MD [per Dr.Plovsky's instructions] for symptoms, and contact office for questions.Mother states she will. Mother states pt's Hepatitis C is worse.Mother states pt is going to court tomorrow for disability.

## 2013-07-22 ENCOUNTER — Ambulatory Visit (INDEPENDENT_AMBULATORY_CARE_PROVIDER_SITE_OTHER): Payer: No Typology Code available for payment source | Admitting: Internal Medicine

## 2013-07-22 ENCOUNTER — Encounter: Payer: Self-pay | Admitting: Internal Medicine

## 2013-07-22 VITALS — BP 117/81 | HR 61 | Temp 97.5°F | Ht 61.0 in | Wt 174.9 lb

## 2013-07-22 DIAGNOSIS — K7460 Unspecified cirrhosis of liver: Secondary | ICD-10-CM

## 2013-07-22 DIAGNOSIS — R259 Unspecified abnormal involuntary movements: Secondary | ICD-10-CM

## 2013-07-22 DIAGNOSIS — Z23 Encounter for immunization: Secondary | ICD-10-CM

## 2013-07-22 DIAGNOSIS — F419 Anxiety disorder, unspecified: Secondary | ICD-10-CM

## 2013-07-22 DIAGNOSIS — J449 Chronic obstructive pulmonary disease, unspecified: Secondary | ICD-10-CM

## 2013-07-22 DIAGNOSIS — Z0000 Encounter for general adult medical examination without abnormal findings: Secondary | ICD-10-CM

## 2013-07-22 DIAGNOSIS — F411 Generalized anxiety disorder: Secondary | ICD-10-CM

## 2013-07-22 DIAGNOSIS — I10 Essential (primary) hypertension: Secondary | ICD-10-CM

## 2013-07-22 DIAGNOSIS — R251 Tremor, unspecified: Secondary | ICD-10-CM

## 2013-07-22 DIAGNOSIS — B182 Chronic viral hepatitis C: Secondary | ICD-10-CM

## 2013-07-22 DIAGNOSIS — F17200 Nicotine dependence, unspecified, uncomplicated: Secondary | ICD-10-CM

## 2013-07-22 DIAGNOSIS — Z299 Encounter for prophylactic measures, unspecified: Secondary | ICD-10-CM

## 2013-07-22 DIAGNOSIS — J4489 Other specified chronic obstructive pulmonary disease: Secondary | ICD-10-CM

## 2013-07-22 NOTE — Assessment & Plan Note (Addendum)
Since getting her nebulizer machine she is using the albuterol twice a day, as she was unable to afford daily COPD medications. She states that her respiratory symptoms are well controlled, and denies any current symptoms. - Continue neb treatments

## 2013-07-22 NOTE — Assessment & Plan Note (Signed)
Flu shot given today, 07/22/2013

## 2013-07-22 NOTE — Assessment & Plan Note (Signed)
Patient still with tremor presumably thought from her antipsychotic medication. The Geodon was changed to Seroquel 25 daily which did not improve her symptoms, and Cogentin was added which unfortunately has not improved her symptoms. She called a [facility] was told to followup with her primary care doctor. Not really sure if her symptoms are related to her medications, but will refer to neurology for further evaluation. She's also to followup with Dr. Donell Beers with a Behavioral Health; she has appointment on 08/04/2013. - Outpatient Neurology evaluation referral placed today - Followup with [facility], Dr. Donell Beers

## 2013-07-22 NOTE — Assessment & Plan Note (Signed)
She saw Dr. Joetta Manners one month ago, and he was telling her about a new pill to treat hepatitis C. She's to follow up with him sometime soon, but does not currently have an appointment. She states that she had LFTs checked one week ago, but does not know the results. Checking her lactulose every other day. - Followup with Dr. Joetta Manners as scheduled - Continue lactulose per Dr. Joetta Manners

## 2013-07-22 NOTE — Progress Notes (Signed)
[name], female   DOB: 02/12/1961, 52 y.o.   MRN: [id number]  Subjective:   [name] female   DOB: 1960/11/03 52 y.o.   MRN: [id number]  HPI: [name] [name] is a 52 y.o. female with PMH cirrhosis from chronic hep C and alcohol abuse, significant anxiety, and a h/o visual hallucinations presents to the clinic for a follow up appointment.   She has been seen at [facility]. Per pt, on the Geodon she began having muscle stiffness in her neck and body shaking. She was changed to Zyprexa instead. The sx continued and she cogentin was added, which has not improved her sx. The pt's family called Behavioral Health on 9/22 and she was told to contact her PCP.  She presents today with continued shaking and neck stiffness. These symptoms have worsened since she was seen in [facility] in August '14.   Past Medical History  Diagnosis Date  . Alcoholic cirrhosis   . COPD (chronic obstructive pulmonary disease)   . Heart palpitations   . Anxiety   . Portal hypertension   . Esophageal varices   . Cholelithiasis   . Splenomegaly   . Fatty liver   . Esophagitis 2010  . Tubulovillous adenoma of colon   . Visual hallucination     since 09/2010/notes 10/30/2012  . Hepatitis C     chronic hepatitis C and Steatohepatitis (hep grade 2, stage 2-3) per 05/13/08 liver biopsy  . Obesity   . cervical Cancer 1999  . Depression   . Shortness of breath    Current Outpatient Prescriptions  Medication Sig Dispense Refill  . albuterol (PROVENTIL HFA;VENTOLIN HFA) 108 (90 BASE) MCG/ACT inhaler Inhale 2 puffs into the lungs every 6 (six) hours as needed for wheezing.  1 Inhaler  2  . albuterol (PROVENTIL) (2.5 MG/3ML) 0.083% nebulizer solution Take 3 mLs (2.5 mg total) by nebulization every 6 (six) hours as needed for wheezing or shortness of breath.  75 mL  12  . benztropine (COGENTIN) 0.5 MG tablet Take 1 tablet (0.5 mg total) by mouth 2 (two)  times daily.  60 tablet  5  . diclofenac sodium (VOLTAREN) 1 % GEL Apply 2 g topically 4 (four) times daily.  2 Tube  2  . furosemide (LASIX) 20 MG tablet Take 1 tablet (20 mg total) by mouth 2 (two) times daily.  60 tablet  2  . ipratropium (ATROVENT HFA) 17 MCG/ACT inhaler Inhale 2 puffs into the lungs 4 (four) times daily.  1 Inhaler  2  . Ipratropium-Albuterol (COMBIVENT) 20-100 MCG/ACT AERS respimat Inhale 1 puff into the lungs every 6 (six) hours as needed for wheezing.  1 Inhaler  11  . lactulose (CHRONULAC) 10 GM/15ML solution Take 30 g by mouth every other day.      Marland Kitchen LORazepam (ATIVAN) 0.5 MG tablet Take 1 tablet (0.5 mg total) by mouth every 8 (eight) hours as needed for anxiety.  90 tablet  0  . methocarbamol (ROBAXIN) 500 MG tablet TAKE 1 TABLET BY MOUTH TWICE DAILY  60 tablet  0  . pantoprazole (PROTONIX) 40 MG tablet Take 1 tablet (40 mg total) by mouth daily.  30 tablet  11  . propranolol (INDERAL) 20 MG tablet Take 20 mg by mouth daily.      . QUEtiapine (SEROQUEL) 25 MG tablet Take 1 tablet (25 mg total) by mouth Nightly.  30 tablet  3  . traMADol (ULTRAM) 50 MG tablet  Take 1 tablet (50 mg total) by mouth every 6 (six) hours as needed for pain.  120 tablet  0   No current facility-administered medications for this visit.   Family History  Problem Relation Age of Onset  . Cervical cancer Sister   . Breast cancer Maternal Aunt   . Heart disease Father 68    died from MI  . Breast cancer Father   . Cervical cancer Daughter    History   Social History  . Marital Status: Married    Spouse Name: N/A    Number of Children: N/A  . Years of Education: N/A   Social History Main Topics  . Smoking status: Former Smoker -- 0.10 packs/day for 40 years    Types: Cigarettes    Quit date: 10/28/2010  . Smokeless tobacco: Never Used  . Alcohol Use: Yes     Comment: months since last drink  . Drug Use: No  . Sexual Activity: None   Other Topics Concern  . None   Social  History Narrative  . None   Review of Systems: Constitutional: +appetite decrease. Denies fever, chills, diaphoresis, appetite change and fatigue.  HEENT: +neck stiffness.  Respiratory: Denies SOB, DOE, cough, chest tightness,  and wheezing.   Cardiovascular: Denies chest pain, palpitations and leg swelling.  Gastrointestinal: +Nausea. Denies vomiting, constipation, blood in stool. Genitourinary: Denies dysuria, urgency, frequency, hematuria, flank pain and difficulty urinating.  Endocrine: Denies: hot or cold intolerance, sweats, changes in hair or nails, polyuria, polydipsia. Musculoskeletal: +chronic lower back pain.  Skin: Denies pallor, rash and wound.  Neurological: +tremoor of her head/neck, BUE and RLE. Denies dizziness, seizures, syncope, weakness, and headaches.  Psychiatric/Behavioral: Denies suicidal ideation and mood changes  Objective:  Physical Exam: Filed Vitals:   07/22/13 1527  BP: 117/81  Pulse: 61  Temp: 97.5 F (36.4 C)  TempSrc: Oral  Height: 5\' 1"  (1.549 m)  Weight: 174 lb 14.4 oz (79.334 kg)  SpO2: 95%   Constitutional: Vital signs reviewed.  Patient is a well-developed and well-nourished female in no acute distress and cooperative with exam. Alert.  Head: Normocephalic and atraumatic Mouth: no erythema or exudates, MMM Eyes: PERRL, EOMI, +sceleral icterus Neck: Supple, Trachea midline normal ROM Cardiovascular: RRR, pulses symmetric and intact bilaterally, improved BLE edema - trace in LLE, none in RLE Pulmonary/Chest: normal respiratory effort, CTAB, no wheezes, rales, or rhonchi Abdominal: Soft. Non-tender, non-distended Musculoskeletal: No joint deformities, erythema, or stiffness, ROM full and no nontender Neurological: A&O x3, cranial nerve II-XII are grossly intact, non-focal Skin: Warm, dry and intact. No rash, cyanosis, or clubbing.  Psychiatric: Normal mood and affect. speech and behavior is normal. Judgment and thought content appear normal.  Cognition and memory appear normal.   Assessment & Plan:   Please refer to Problem List based Assessment and Plan

## 2013-07-22 NOTE — Patient Instructions (Addendum)
**  I am referring you to Neurology for the shaking you are experiencing.   **Please be sure to keep your appointment with Dr. Donell Beers- please discuss your symptoms with him, as your medications might need to be changed. I would like for him to begin to manage your Ativan as well.   **Be sure to call Dr. Joetta Manners for your next appointment in the Hepatitis Clinic.

## 2013-07-22 NOTE — Assessment & Plan Note (Signed)
BP Readings from Last 3 Encounters:  07/22/13 117/81  07/16/13 111/68  06/30/13 126/80    Lab Results  Component Value Date   NA 139 05/08/2013   K 3.6 05/08/2013   CREATININE 0.49* 05/08/2013    Assessment: Blood pressure control: controlled Progress toward BP goal:  at goal  Plan: Medications:  continue current medications Educational resources provided: brochure;handout;video Self management tools provided:

## 2013-07-22 NOTE — Assessment & Plan Note (Addendum)
Patient currently treated with Ativan 0.5 mg Q8 hours. She seen by Dr. Donell Beers with Behavioral Health for her possible bipolar disorder and schizophrenia. I would like for Dr. Donell Beers to begin to evaluate her for her anxiety as well and prescribe her the Ativan

## 2013-07-23 NOTE — Progress Notes (Signed)
Case discussed with Dr. Glenn at the time of the visit.  We reviewed the resident's history and exam and pertinent patient test results.  I agree with the assessment, diagnosis, and plan of care documented in the resident's note.   

## 2013-07-26 ENCOUNTER — Ambulatory Visit: Payer: Self-pay

## 2013-07-27 ENCOUNTER — Other Ambulatory Visit: Payer: Self-pay | Admitting: Internal Medicine

## 2013-07-28 ENCOUNTER — Other Ambulatory Visit: Payer: Self-pay | Admitting: *Deleted

## 2013-07-28 ENCOUNTER — Ambulatory Visit: Payer: Self-pay

## 2013-07-28 DIAGNOSIS — F419 Anxiety disorder, unspecified: Secondary | ICD-10-CM

## 2013-07-29 MED ORDER — LORAZEPAM 0.5 MG PO TABS: 0.5000 mg | ORAL_TABLET | Freq: Three times a day (TID) | ORAL | Status: DC | PRN

## 2013-07-29 NOTE — Telephone Encounter (Signed)
Called to pharm 

## 2013-08-02 ENCOUNTER — Ambulatory Visit: Payer: Self-pay | Admitting: Neurology

## 2013-08-04 ENCOUNTER — Ambulatory Visit (HOSPITAL_COMMUNITY): Payer: Self-pay | Admitting: Psychiatry

## 2013-08-25 ENCOUNTER — Ambulatory Visit (INDEPENDENT_AMBULATORY_CARE_PROVIDER_SITE_OTHER): Payer: Self-pay | Admitting: Psychiatry

## 2013-08-25 VITALS — BP 100/66 | HR 63 | Ht 62.0 in | Wt 176.0 lb

## 2013-08-25 DIAGNOSIS — F419 Anxiety disorder, unspecified: Secondary | ICD-10-CM

## 2013-08-25 DIAGNOSIS — F341 Dysthymic disorder: Secondary | ICD-10-CM | POA: Insufficient documentation

## 2013-08-25 MED ORDER — LORAZEPAM 0.5 MG PO TABS: 0.5000 mg | ORAL_TABLET | Freq: Three times a day (TID) | ORAL | Status: DC | PRN

## 2013-08-25 NOTE — Progress Notes (Signed)
[facility] MD Progress Note  08/25/2013 1:55 PM [name] [name]  MRN:  [id number] Subjective:  Ok Today the patient is seen alone and she is on time. Essentially there are no significant changes. The patient had her disability hearing a month ago and is waiting for the results. She's applying for disability she says for liver disease. The patient says she did not sleep well last night. She said this is due to back pain from her scoliosis. The patient apparently has back pain as well as chronic mild depression. Noted in her first visit the patient shared that history that she was seen at [facility] for auditory hallucinations. They stopped after the first day of Geodon treatment. Months later unfortunately the patient I believe developed akathisia and other EPS symptomatology. After her first visit we discontinued her Geodon and put her on a very small dose of Seroquel. At this time all her movement complaints have gone away. Fortunately the patient has not had a recurrence of auditory or visual hallucinations. The patient demonstrates no evidence of psychosis. The patient says she does not feel depressed as long as she is with her family. When she is alone she feels depressed some of the time. She enjoys being with her grandchildren a great deal and watches TV. 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 The patient lives alone in a handicapped apartment setting. At this time the patient is sleeping as usual. She's eating well. She's got a good amount of energy. This patient does not drink alcohol. This patient is not suicidal. I taken her off Geodon her movements symptoms are gone and her psychosis has not recurred. She shares with me that her primary care doctor has requested that I control her Ativan. Noted is her Ativan was begun approximately a year ago at [facility] together with the Geodon. On her last visit here after we discontinue the Geodon we did start her on a very low  dose of Seroquel. My plan is to start off by discontinuing her Seroquel at this time. The patient says that she has some right pain in her flank which she thinks might be related to Seroquel, I doubt it. Diagnosis:   DSM5: Schizophrenia Disorders:  Brief Psychotic Disorder (298.8) Obsessive-Compulsive Disorders:   Trauma-Stressor Disorders:   Substance/Addictive Disorders:   Depressive Disorders:    Axis I: Dysthymic Disorder  ADL's:  Intact  Sleep: Good  Appetite:  Good  Suicidal Ideation:  no Homicidal Ideation:  no AEB (as evidenced by):  Psychiatric Specialty Exam: ROS  Blood pressure 100/66, pulse 63, height 5\' 2"  (1.575 m), weight 176 lb (79.833 kg).Body mass index is 32.18 kg/(m^2).  General Appearance: Fairly Groomed  Patent attorney::  Good  Speech:  Clear and Coherent  Volume:  Normal  Mood:  Euthymic  Affect:  Flat  Thought Process:  Coherent  Orientation:  Full (Time, Place, and Person)  Thought Content:  WDL  Suicidal Thoughts:  No  Homicidal Thoughts:  No  Memory:  nl  Judgement:  Fair  Insight:  Lacking  Psychomotor Activity:  Normal  Concentration:  Good  Recall:  Good  Akathisia:  No  Handed:  Right  AIMS (if indicated):     Assets:  Desire for Improvement  Sleep:      Current Medications: Current Outpatient Prescriptions  Medication Sig Dispense Refill  . albuterol (PROVENTIL HFA;VENTOLIN HFA) 108 (90 BASE) MCG/ACT inhaler Inhale 2 puffs into the lungs every 6 (  six) hours as needed for wheezing.  1 Inhaler  2  . albuterol (PROVENTIL) (2.5 MG/3ML) 0.083% nebulizer solution Take 3 mLs (2.5 mg total) by nebulization every 6 (six) hours as needed for wheezing or shortness of breath.  75 mL  12  . benztropine (COGENTIN) 0.5 MG tablet Take 1 tablet (0.5 mg total) by mouth 2 (two) times daily.  60 tablet  5  . diclofenac sodium (VOLTAREN) 1 % GEL Apply 2 g topically 4 (four) times daily.  2 Tube  2  . furosemide (LASIX) 20 MG tablet Take 1 tablet (20 mg  total) by mouth 2 (two) times daily.  60 tablet  2  . ipratropium (ATROVENT HFA) 17 MCG/ACT inhaler Inhale 2 puffs into the lungs 4 (four) times daily.  1 Inhaler  2  . Ipratropium-Albuterol (COMBIVENT) 20-100 MCG/ACT AERS respimat Inhale 1 puff into the lungs every 6 (six) hours as needed for wheezing.  1 Inhaler  11  . lactulose (CHRONULAC) 10 GM/15ML solution Take 30 g by mouth every other day.      Marland Kitchen LORazepam (ATIVAN) 0.5 MG tablet Take 1 tablet (0.5 mg total) by mouth every 8 (eight) hours as needed for anxiety.  90 tablet  2  . methocarbamol (ROBAXIN) 500 MG tablet TAKE 1 TABLET BY MOUTH TWICE DAILY  60 tablet  0  . pantoprazole (PROTONIX) 40 MG tablet Take 1 tablet (40 mg total) by mouth daily.  30 tablet  11  . propranolol (INDERAL) 20 MG tablet Take 20 mg by mouth daily.      . traMADol (ULTRAM) 50 MG tablet TAKE ONE TABLET BY MOUTH EVERY 6 HOURS AS NEEDED FOR PAIN  120 tablet  0   No current facility-administered medications for this visit.    Lab Results: No results found for this or any previous visit (from the past 48 hour(s)).  Physical Findings: AIMS:  , ,  ,  ,    CIWA:    COWS:     Treatment Plan Summary: At this time we she'll discontinue her Seroquel. At this time we'll continue Radovan 0.5 mg 3 times a day but we'll reassess its need on her next visit in 2 months. At that time our consider reducing it to twice a day and slowly taper her off of it. The possibility of her entering into some supportive psychotherapy could be considered at that time.   Plan:  Medical Decision Making Problem Points:  Established problem, stable/improving (1) Data Points:  Review of medication regiment & side effects (2)  I certify that inpatient services furnished can reasonably be expected to improve the patient's condition.   Ebonye Reade IRVING 08/25/2013, 1:55 PM

## 2013-08-30 ENCOUNTER — Other Ambulatory Visit: Payer: Self-pay | Admitting: *Deleted

## 2013-08-30 DIAGNOSIS — G8929 Other chronic pain: Secondary | ICD-10-CM

## 2013-08-30 NOTE — Telephone Encounter (Signed)
Pt was seen by her liver doctor, Dr Jacqualine Mau from [facility].  Patient states he wants to do an Ultra Sound of her Liver but wants PCP to order the test. Please call Dr Cynda Familia,  (@ [phone] in [place])  Or at the [facility] office where she sees him on Thursdays # [phone]   Pt is seen for Hep C   Jacqualine Mau also wants to set her up in a new medication program, quite expensive.  Pt states she is waiting on approval for her Medicaid card, this may pay for the medication.  Lastly she is asking for a Rx for a walking cane.  She has an old cane she uses and does not need a new one but she wants it documented in her Medical record that she was give a Rx for the cane. Pt # [id number]

## 2013-08-30 NOTE — Telephone Encounter (Signed)
done

## 2013-08-31 MED ORDER — TRAMADOL HCL 50 MG PO TABS: ORAL_TABLET | ORAL | Status: DC

## 2013-09-16 ENCOUNTER — Telehealth (HOSPITAL_COMMUNITY): Payer: Self-pay | Admitting: *Deleted

## 2013-09-16 NOTE — Telephone Encounter (Signed)
Pt called stating Dr Donell Beers discontinued her Seroquel recently due to interferring with her Cirrhosis of the Liver. States she needs something in place of the Seroquel because she is seeing bugs, not answering her door, decrease in grooming, and feeling depressed. "I'm going back to the way I was before." Asking that Dr start her on medication to help with symptoms. Did explain if she felt suicidal or increase in depression to call back and given information about inpatient services. Pt aware and denies SI at this time. Message routed to Dr Donell Beers.

## 2013-09-20 ENCOUNTER — Telehealth (HOSPITAL_COMMUNITY): Payer: Self-pay | Admitting: *Deleted

## 2013-09-20 DIAGNOSIS — F411 Generalized anxiety disorder: Secondary | ICD-10-CM

## 2013-09-20 NOTE — Telephone Encounter (Signed)
Pt called 11/21 @ 1553-VM recv'd 11/24 @ 0842:Spoke with nurse Thursday about depression increasing.Thought Dr.Plovsky would call back as works half days on Fridays.did not hear from him.Feels very bad and needs medicine. Message in Dr. Elwyn Reach from Thursday phone call.Jeanene Erb Dr.Plovsky's VM [phone] and left message with pt concerns. Notified pt that message left for MD in Inbasket and on VM

## 2013-09-21 NOTE — Telephone Encounter (Signed)
Please put pt  backon seroquel  25 mg BID

## 2013-09-22 MED ORDER — QUETIAPINE FUMARATE 25 MG PO TABS: 25.0000 mg | ORAL_TABLET | Freq: Two times a day (BID) | ORAL | Status: DC

## 2013-09-22 NOTE — Addendum Note (Signed)
Addended by: Tonny Bollman on: 09/22/2013 10:49 AM   Modules accepted: Orders

## 2013-09-22 NOTE — Telephone Encounter (Signed)
Contacted pt. Informed her of Dr. Caprice Renshaw order: Seroquel 25 mg BID Pt asked that RX be sent to [facility] as medications cheaper

## 2013-09-24 ENCOUNTER — Encounter (HOSPITAL_COMMUNITY): Payer: Self-pay | Admitting: Emergency Medicine

## 2013-09-24 ENCOUNTER — Emergency Department (INDEPENDENT_AMBULATORY_CARE_PROVIDER_SITE_OTHER)
Admission: EM | Admit: 2013-09-24 | Discharge: 2013-09-24 | Disposition: A | Discharge status: Home / Self Care | Payer: Self-pay | Source: Home / Self Care | Attending: Family Medicine | Admitting: Family Medicine

## 2013-09-24 DIAGNOSIS — F411 Generalized anxiety disorder: Secondary | ICD-10-CM

## 2013-09-24 DIAGNOSIS — R441 Visual hallucinations: Secondary | ICD-10-CM

## 2013-09-24 DIAGNOSIS — H5316 Psychophysical visual disturbances: Secondary | ICD-10-CM

## 2013-09-24 MED ORDER — QUETIAPINE FUMARATE 25 MG PO TABS: 25.0000 mg | ORAL_TABLET | Freq: Two times a day (BID) | ORAL | Status: DC

## 2013-09-24 NOTE — ED Notes (Addendum)
Needs refill on seroquill till Monday. Took last pill this a.m. States takes twice daily.

## 2013-09-24 NOTE — ED Provider Notes (Signed)
[name] [name] is a 52 y.o. female who presents to Urgent Care today for refill of Seroquel. Patient has a past medical history significant for schizophrenia. She was recently restarted on Seroquel. Her psychiatrist called in a new prescription to pharmacy that is closed. She took her last year old this morning. Her pharmacy will open on Monday and she needs 6 Seroquel tablets until she can be seen. She is currently doing well on Seroquel and no longer having any hallucinations, delusions, SI/HI. She feels well.   Past Medical History  Diagnosis Date  . Alcoholic cirrhosis   . COPD (chronic obstructive pulmonary disease)   . Heart palpitations   . Anxiety   . Portal hypertension   . Esophageal varices   . Cholelithiasis   . Splenomegaly   . Fatty liver   . Esophagitis 2010  . Tubulovillous adenoma of colon   . Visual hallucination     since 09/2010/notes 10/30/2012  . Hepatitis C     chronic hepatitis C and Steatohepatitis (hep grade 2, stage 2-3) per 05/13/08 liver biopsy  . Obesity   . cervical Cancer 1999  . Depression   . Shortness of breath    History  Substance Use Topics  . Smoking status: Former Smoker -- 0.10 packs/day for 40 years    Types: Cigarettes    Quit date: 10/28/2010  . Smokeless tobacco: Never Used  . Alcohol Use: Yes     Comment: months since last drink   ROS as above Medications reviewed. No current facility-administered medications for this encounter.   Current Outpatient Prescriptions  Medication Sig Dispense Refill  . benztropine (COGENTIN) 0.5 MG tablet Take 1 tablet (0.5 mg total) by mouth 2 (two) times daily.  60 tablet  5  . furosemide (LASIX) 20 MG tablet Take 1 tablet (20 mg total) by mouth 2 (two) times daily.  60 tablet  2  . Ipratropium-Albuterol (COMBIVENT) 20-100 MCG/ACT AERS respimat Inhale 1 puff into the lungs every 6 (six) hours as needed for wheezing.  1 Inhaler  11  . lactulose (CHRONULAC) 10 GM/15ML solution Take 30 g by  mouth every other day.      Marland Kitchen LORazepam (ATIVAN) 0.5 MG tablet Take 1 tablet (0.5 mg total) by mouth every 8 (eight) hours as needed for anxiety.  90 tablet  2  . methocarbamol (ROBAXIN) 500 MG tablet TAKE 1 TABLET BY MOUTH TWICE DAILY  60 tablet  0  . propranolol (INDERAL) 20 MG tablet Take 20 mg by mouth daily.      Marland Kitchen albuterol (PROVENTIL HFA;VENTOLIN HFA) 108 (90 BASE) MCG/ACT inhaler Inhale 2 puffs into the lungs every 6 (six) hours as needed for wheezing.  1 Inhaler  2  . albuterol (PROVENTIL) (2.5 MG/3ML) 0.083% nebulizer solution Take 3 mLs (2.5 mg total) by nebulization every 6 (six) hours as needed for wheezing or shortness of breath.  75 mL  12  . diclofenac sodium (VOLTAREN) 1 % GEL Apply 2 g topically 4 (four) times daily.  2 Tube  2  . ipratropium (ATROVENT HFA) 17 MCG/ACT inhaler Inhale 2 puffs into the lungs 4 (four) times daily.  1 Inhaler  2  . pantoprazole (PROTONIX) 40 MG tablet Take 1 tablet (40 mg total) by mouth daily.  30 tablet  11  . QUEtiapine (SEROQUEL) 25 MG tablet Take 1 tablet (25 mg total) by mouth 2 (two) times daily.  10 tablet  0  . [START ON 09/30/2013] traMADol (ULTRAM) 50 MG tablet  TAKE ONE TABLET BY MOUTH EVERY 6 HOURS AS NEEDED FOR PAIN  120 tablet  0    Exam:  BP 118/80  Pulse 67  Temp(Src) 97.9 F (36.6 C) (Oral)  Resp 12  SpO2 100% Gen: Well NAD Psych: Slightly flattened affect. Slight tremor. Normal thought process. Speech is normal. No SI/HI. Patient does not appear to be responding to hallucinations.    Assessment and Plan: 52 y.o. female with schizophrenia with medication refill. Plan to prescribe 10 Seroquel tablets. Patient should followup with her psychiatrist ASAP. Discussed warning signs or symptoms. Please see discharge instructions. Patient expresses understanding.      Rodolph Bong, MD 09/24/13 1640

## 2013-10-07 ENCOUNTER — Encounter
Payer: Medicaid Other | Attending: Physical Medicine and Rehabilitation | Admitting: Physical Medicine and Rehabilitation

## 2013-10-07 ENCOUNTER — Encounter: Payer: Self-pay | Admitting: Physical Medicine and Rehabilitation

## 2013-10-07 ENCOUNTER — Ambulatory Visit (HOSPITAL_COMMUNITY)
Admission: RE | Admit: 2013-10-07 | Discharge: 2013-10-07 | Disposition: A | Discharge status: Home / Self Care | Payer: No Typology Code available for payment source | Source: Ambulatory Visit | Attending: Physical Medicine and Rehabilitation | Admitting: Physical Medicine and Rehabilitation

## 2013-10-07 VITALS — BP 88/61 | HR 80 | Resp 14 | Ht 61.0 in | Wt 181.6 lb

## 2013-10-07 DIAGNOSIS — M62830 Muscle spasm of back: Secondary | ICD-10-CM

## 2013-10-07 DIAGNOSIS — M545 Low back pain, unspecified: Secondary | ICD-10-CM | POA: Insufficient documentation

## 2013-10-07 DIAGNOSIS — M47817 Spondylosis without myelopathy or radiculopathy, lumbosacral region: Secondary | ICD-10-CM

## 2013-10-07 DIAGNOSIS — R209 Unspecified disturbances of skin sensation: Secondary | ICD-10-CM | POA: Insufficient documentation

## 2013-10-07 DIAGNOSIS — IMO0001 Reserved for inherently not codable concepts without codable children: Secondary | ICD-10-CM | POA: Insufficient documentation

## 2013-10-07 DIAGNOSIS — M62838 Other muscle spasm: Secondary | ICD-10-CM | POA: Insufficient documentation

## 2013-10-07 DIAGNOSIS — M25579 Pain in unspecified ankle and joints of unspecified foot: Secondary | ICD-10-CM | POA: Insufficient documentation

## 2013-10-07 DIAGNOSIS — M25571 Pain in right ankle and joints of right foot: Secondary | ICD-10-CM

## 2013-10-07 DIAGNOSIS — M7989 Other specified soft tissue disorders: Secondary | ICD-10-CM | POA: Insufficient documentation

## 2013-10-07 DIAGNOSIS — M5380 Other specified dorsopathies, site unspecified: Secondary | ICD-10-CM | POA: Insufficient documentation

## 2013-10-07 DIAGNOSIS — R269 Unspecified abnormalities of gait and mobility: Secondary | ICD-10-CM | POA: Insufficient documentation

## 2013-10-07 DIAGNOSIS — M542 Cervicalgia: Secondary | ICD-10-CM | POA: Insufficient documentation

## 2013-10-07 MED ORDER — METHOCARBAMOL 500 MG PO TABS: ORAL_TABLET | ORAL | Status: DC

## 2013-10-07 NOTE — Progress Notes (Signed)
Subjective:    [name], female    DOB: 06/13/1961, 52 y.o.   MRN: [id number]   CC:  Right foot pain.   HPI [name] is a 52 year old female with history of chronic LBP with back and RLE spasms. She was scheduled for follow up from that but main complaint today is right ankle pain with decrease in ROM as well as difficulty walking due to pain. She reports that pain started a few days ago but got worse last night. She turned in bed, heard/felt a pop in her right foot and now has problems moving the foot. Pain is stinging and burning--nothing makes it better.  She also complaint lower back pain that "moves up her back". Tramadol is minimally effective. Robaxin has helped in the past. Trigger point injections were ineffective.    Pain Inventory Average Pain 7 Pain Right Now 4 My pain is burning, stabbing and aching  In the last 24 hours, has pain interfered with the following? General activity 10 Relation with others 9 Enjoyment of life 8 What TIME of day is your pain at its worst? evening, night Sleep (in general) Poor  Pain is worse with: walking, bending, sitting and standing Pain improves with: rest and medication Relief from Meds: 4  Mobility walk with assistance use a cane how many minutes can you walk? 5 ability to climb steps?  no do you drive?  no transfers alone  Function not employed: date last employed na I need assistance with the following:  meal prep, household duties and shopping Do you have any goals in this area?  yes  Neuro/Psych tremor trouble walking confusion depression anxiety  Prior Studies Any changes since last visit?  no  Physicians involved in your care Any changes since last visit?  no   Family History  Problem Relation Age of Onset  . Cervical cancer Sister   . Breast cancer Maternal Aunt   . Heart disease Father 7    died from MI  . Breast cancer Father   . Cervical cancer Daughter     History   Social History  . Marital Status: Married    Spouse Name: N/A    Number of Children: N/A  . Years of Education: N/A   Social History Main Topics  . Smoking status: Former Smoker -- 0.10 packs/day for 40 years    Types: Cigarettes    Quit date: 10/28/2010  . Smokeless tobacco: Never Used  . Alcohol Use: Yes     Comment: months since last drink  . Drug Use: No  . Sexual Activity: None   Other Topics Concern  . None   Social History Narrative  . None   Past Surgical History  Procedure Laterality Date  . Cervical cancer surgery    . Abdominal hysterectomy    . Tubal ligation    . Esophagogastroduodenoscopy  10/18/2011    Procedure: ESOPHAGOGASTRODUODENOSCOPY (EGD);  Surgeon: Rob Bunting, MD;  Location: [facility];  Service: Endoscopy;  Laterality: N/A;  . Hysterectomy     Past Medical History  Diagnosis Date  . Alcoholic cirrhosis   . COPD (chronic obstructive pulmonary disease)   . Heart palpitations   . Anxiety   . Portal hypertension   . Esophageal varices   . Cholelithiasis   . Splenomegaly   . Fatty liver   . Esophagitis 2010  . Tubulovillous adenoma of colon   . Visual hallucination     since  09/2010/notes 10/30/2012  . Hepatitis C     chronic hepatitis C and Steatohepatitis (hep grade 2, stage 2-3) per 05/13/08 liver biopsy  . Obesity   . cervical Cancer 1999  . Depression   . Shortness of breath    BP 88/61  Pulse 80  Resp 14  Ht 5\' 1"  (1.549 m)  Wt 181 lb 9.6 oz (82.373 kg)  BMI 34.33 kg/m2  SpO2 87%     Review of Systems  Constitutional: Positive for fatigue.  Respiratory: Positive for shortness of breath and wheezing.   Gastrointestinal: Positive for nausea.  Musculoskeletal: Positive for gait problem, joint swelling (right ankle), myalgias and neck pain.  Neurological: Positive for tremors.  Psychiatric/Behavioral: Positive for confusion and dysphoric mood. The patient is nervous/anxious.        Objective:   Physical  Exam  Nursing note and vitals reviewed. Constitutional: She is oriented to person, place, and time. She has a sickly appearance.  Appears older than stated age.   HENT:  Head: Normocephalic and atraumatic.  Eyes: Conjunctivae are normal. Pupils are equal, round, and reactive to light.  Cardiovascular: Normal rate and regular rhythm.   Pulmonary/Chest: Effort normal.  Wheezing sounds upper airway  Musculoskeletal:  Has difficulty getting up from the chair and antalgic gait on right. Moves BUE without difficulty. Back diffusely tender with palpation. Slow hesitant movements BLE. DTRs preserved bilaterally. Right HF and KE 3+/5--pain inhibition. Right ankle pain with flexion, extension and lateral movements--no edema, erythema or point tenderness.       Neurological: She is alert and oriented to person, place, and time.  Mild dysarthria.  Skin: Skin is warm and dry.  Psychiatric: Her mood appears anxious. Her speech is tangential. She is slowed.          Assessment & Plan:  1. Right ankle pain: Will xray to rule out fracture. Recommended ice qid for pain management. Orders sent to [facility]--patient hesitant about getting X rays today due to concerns regarding  transportation issues.    2. Low back pain: She reports that nothing helps her pain. Tramadol is minimally effective. She does exercise anymore and not interested in physical therapy as she feels that this is ineffective. Non- narcotic measures emphasized. She wants to know alternative options and questions need for surgery. Last MRI 18 months ago showed  "Slight lumbar scoliosis with multilevel slight degenerative disc disease without neural impingement". Will repeat MRI to rule out worsening due to increase in pain.   3. Back spasms: Renewed Robaxin as this has been effective in the past. Recommended following up with primary care for refills on this as they are prescribing tramadol but she declined. She prefers to come back here for  follow up due to consistency of provider and ease of follow up.

## 2013-10-07 NOTE — Patient Instructions (Addendum)
Ice right ankle four times day. Go by [facility] today to get ankle xrays.

## 2013-10-08 ENCOUNTER — Telehealth: Payer: Self-pay

## 2013-10-08 NOTE — Telephone Encounter (Signed)
Patient informed of her x-ray results.

## 2013-10-08 NOTE — Telephone Encounter (Signed)
Patient called to ankle xray results.

## 2013-10-08 NOTE — Telephone Encounter (Signed)
No evidence of ankle fracture or dislocation. Some swelling in soft tissue may indicate a sprain Please ice 30 minutes every 2 hours Wrap with ace bandage

## 2013-10-12 ENCOUNTER — Ambulatory Visit: Payer: Self-pay | Admitting: Physical Medicine and Rehabilitation

## 2013-10-22 ENCOUNTER — Inpatient Hospital Stay: Admission: RE | Admit: 2013-10-22 | Payer: Self-pay | Source: Ambulatory Visit

## 2013-10-23 ENCOUNTER — Telehealth: Payer: Self-pay | Admitting: Internal Medicine

## 2013-10-23 MED ORDER — LACTULOSE 10 GM/15ML PO SOLN: 30.0000 g | ORAL | Status: DC

## 2013-10-23 MED ORDER — PANTOPRAZOLE SODIUM 40 MG PO TBEC: 40.0000 mg | DELAYED_RELEASE_TABLET | Freq: Every day | ORAL | Status: DC

## 2013-10-23 NOTE — Telephone Encounter (Signed)
   Reason for call:   I received a call from [name] at 230  PM indicating that she needed to have 2 of her recent prescriptions sent to the [facility] given her regular pharmacy is closed. She stated that she had some financial difficulties previously that prevented her from obtaining her medications but should be able to at this time.   Pertinent Data:   Reviewed last clinic note and medications with patient   Assessment / Plan / Recommendations:   Prescription for lactulose and protonix was sent to [facility] pharmacy  As always, pt is advised that if symptoms worsen or new symptoms arise, they should go to an urgent care facility or to to ER for further evaluation.   [name], MD   10/23/2013, 2:33 PM

## 2013-10-26 ENCOUNTER — Telehealth: Payer: Self-pay | Admitting: Internal Medicine

## 2013-10-26 NOTE — Telephone Encounter (Signed)
  INTERNAL MEDICINE RESIDENCY PROGRAM After-Hours Telephone Call    Reason for call:   I placed an outgoing call to [name] at 700 pm regarding constipation. Patient reports that she feel constipated for 3-4 days.  She usually has bowel movement every day or every other day.  She also complains nausea, anorexia and diffused abdominal pain.  She did not take her temperature but endorses feeling "hot and cold " .  The prescription of lactulose and protonix were sent to the [facility] pharmacy by clinic resident on 10/23/2013.  And she took 2 doses of lactulose today without any improvement.   Of note, patient is noted to have liver cirrhosis associated with her chronic hepatitis C.     Last encounter / Pertinent Data:    Last seen in Clinic by Dr. Sherrine Maples for routine followup.      Assessment/ Plan:  Assessment       Given her clinical presentation and history of liver cirrhosis, the etiology of her nausea, anorexia and diffuse pain will need to be evaluated by provider.  I am unable to evaluate her over the phone.    Plan 1. instructed patient to call 911 or go to emergency room as soon as possible for evaluation of her nausea, anorexia and diffused abdominal pain in the setting of liver cirrhosis. 2. Patient understands and agrees with the above plan.  She states that she will go to emergency room as soon as possible.      Dede Query, MD   10/26/2013, 7:06 PM

## 2013-10-27 ENCOUNTER — Other Ambulatory Visit: Payer: Self-pay | Admitting: Internal Medicine

## 2013-10-29 NOTE — Telephone Encounter (Signed)
Called to pharm 

## 2013-11-03 ENCOUNTER — Other Ambulatory Visit: Payer: Self-pay

## 2013-11-04 ENCOUNTER — Ambulatory Visit (INDEPENDENT_AMBULATORY_CARE_PROVIDER_SITE_OTHER): Payer: No Typology Code available for payment source | Admitting: Internal Medicine

## 2013-11-04 ENCOUNTER — Encounter: Payer: Self-pay | Admitting: Internal Medicine

## 2013-11-04 VITALS — BP 99/70 | HR 69 | Temp 96.6°F | Ht 62.0 in | Wt 187.6 lb

## 2013-11-04 DIAGNOSIS — S93401A Sprain of unspecified ligament of right ankle, initial encounter: Secondary | ICD-10-CM

## 2013-11-04 DIAGNOSIS — K7460 Unspecified cirrhosis of liver: Secondary | ICD-10-CM

## 2013-11-04 DIAGNOSIS — S93409A Sprain of unspecified ligament of unspecified ankle, initial encounter: Secondary | ICD-10-CM

## 2013-11-04 DIAGNOSIS — Z0000 Encounter for general adult medical examination without abnormal findings: Secondary | ICD-10-CM

## 2013-11-04 DIAGNOSIS — B372 Candidiasis of skin and nail: Secondary | ICD-10-CM

## 2013-11-04 DIAGNOSIS — M25571 Pain in right ankle and joints of right foot: Secondary | ICD-10-CM

## 2013-11-04 DIAGNOSIS — B182 Chronic viral hepatitis C: Secondary | ICD-10-CM

## 2013-11-04 DIAGNOSIS — G8929 Other chronic pain: Secondary | ICD-10-CM | POA: Insufficient documentation

## 2013-11-04 MED ORDER — NYSTATIN 100000 UNIT/GM EX POWD: CUTANEOUS | Status: DC

## 2013-11-04 NOTE — Assessment & Plan Note (Signed)
Pt with candidiasis under her breasts bilaterally and in the skin folds of her groin. Will treat with topical Nystatin powder TID. However, if the infection does not clear with topical agents, she will likely need po Fluconazole. She was advised to wear lose fitting clothing and keep the infected areas clean and dry. She is to call the clinic if her symptoms do not improve over the next few days.

## 2013-11-04 NOTE — Progress Notes (Addendum)
[name], female   DOB: 11/17/1960, 53 y.o.   MRN: [id number]  Subjective:   [name] female   DOB: [birth year] 53 y.o.   MRN: [id number]  HPI: [name] [name] is a 53 y.o. F with PMH cirrhosis from chronic hep C and alcohol abuse, significant anxiety, and a h/o visual hallucinations presents to the clinic for a follow up appointment.  She called the oncall resident on 12/20 c/o constipation despite taking her lactulose. She also c/o nausea, anorexia, and diffuse abdominal pain. She was instructed to go to the ED for further evaluation, but she did not. She does have a h/o Hep C and liver cirrhosis and is being seen by Dr. Leilani Able. Today she denies any further constipation or abdominal pain after taking a few doses of the lactulose and drinking prune juice.   She presents today c/o of bilateral ankle pain, R>L. She states that she was sitting down and rolled her ankle around and felt a "pop". She states that she saw Dr. Letta Pate PA on 12/11. A xray of her right ankle was performed and was normal. She was told that she might have a sprain of her right ankle and to ice and elevate the foot.   She also c/o of a rash under both of her breasts and in the skin folds of her groin bilaterally. She has been treated in the past for candidiasis.   Pt states that she has just approved for Medicaid with [employer]. However, she has not receive her insurance card yet.   Her next appt with Dr. Zollie Scale is 1/16 and her next appt with Dr. Letta Pate is 2/13, where she states he is going to repeat a x-ray of her back.   Past Medical History  Diagnosis Date  . Alcoholic cirrhosis   . COPD (chronic obstructive pulmonary disease)   . Heart palpitations   . Anxiety   . Portal hypertension   . Esophageal varices   . Cholelithiasis   . Splenomegaly   . Fatty liver   . Esophagitis 2010  . Tubulovillous adenoma of colon   . Visual hallucination    since 09/2010/notes 10/30/2012  . Hepatitis C     chronic hepatitis C and Steatohepatitis (hep grade 2, stage 2-3) per 05/13/08 liver biopsy  . Obesity   . cervical Cancer 1999  . Depression   . Shortness of breath    Current Outpatient Prescriptions  Medication Sig Dispense Refill  . albuterol (PROVENTIL HFA;VENTOLIN HFA) 108 (90 BASE) MCG/ACT inhaler Inhale 2 puffs into the lungs every 6 (six) hours as needed for wheezing.  1 Inhaler  2  . albuterol (PROVENTIL) (2.5 MG/3ML) 0.083% nebulizer solution Take 3 mLs (2.5 mg total) by nebulization every 6 (six) hours as needed for wheezing or shortness of breath.  75 mL  12  . benztropine (COGENTIN) 0.5 MG tablet Take 1 tablet (0.5 mg total) by mouth 2 (two) times daily.  60 tablet  5  . diclofenac sodium (VOLTAREN) 1 % GEL Apply 2 g topically 4 (four) times daily.  2 Tube  2  . furosemide (LASIX) 20 MG tablet Take 1 tablet (20 mg total) by mouth 2 (two) times daily.  60 tablet  2  . ipratropium (ATROVENT HFA) 17 MCG/ACT inhaler Inhale 2 puffs into the lungs 4 (four) times daily.  1 Inhaler  2  . Ipratropium-Albuterol (COMBIVENT) 20-100 MCG/ACT AERS respimat Inhale 1 puff into the lungs every 6 (six)  hours as needed for wheezing.  1 Inhaler  11  . lactulose (CHRONULAC) 10 GM/15ML solution Take 45 mLs (30 g total) by mouth every other day.  240 mL  0  . LORazepam (ATIVAN) 0.5 MG tablet Take 1 tablet (0.5 mg total) by mouth every 8 (eight) hours as needed for anxiety.  90 tablet  2  . methocarbamol (ROBAXIN) 500 MG tablet TAKE 1 TABLET BY MOUTH TWICE DAILY  60 tablet  0  . pantoprazole (PROTONIX) 40 MG tablet Take 1 tablet (40 mg total) by mouth daily.  30 tablet  11  . propranolol (INDERAL) 20 MG tablet Take 20 mg by mouth daily.      . QUEtiapine (SEROQUEL) 25 MG tablet Take 1 tablet (25 mg total) by mouth 2 (two) times daily.  10 tablet  0  . traMADol (ULTRAM) 50 MG tablet Take 1 tablet (50 mg total) by mouth every 6 (six) hours as needed.  120  tablet  0   No current facility-administered medications for this visit.   Family History  Problem Relation Age of Onset  . Cervical cancer Sister   . Breast cancer Maternal Aunt   . Heart disease Father 50    died from MI  . Breast cancer Father   . Cervical cancer Daughter    History   Social History  . Marital Status: Married    Spouse Name: N/A    Number of Children: N/A  . Years of Education: N/A   Social History Main Topics  . Smoking status: Former Smoker -- 0.10 packs/day for 40 years    Types: Cigarettes    Quit date: 10/28/2010  . Smokeless tobacco: Never Used  . Alcohol Use: Yes     Comment: months since last drink  . Drug Use: No  . Sexual Activity: None   Other Topics Concern  . None   Social History Narrative  . None   Review of Systems: Constitutional: Denies fever or fatigue.  Respiratory: Denies SOB or DOE Cardiovascular: Denies chest pain Gastrointestinal: Denies nausea, vomiting, abdominal pain, diarrhea, constipation Genitourinary: Denies dysuria Musculoskeletal: +back pain and bilateral ankle pain, R>L  Skin: +dry, scaling skin  Neurological: Denies dizziness, syncope, weakness Psychiatric/Behavioral: Denies suicidal ideation, mood changes, confusion, or hallucinations   Objective:  Physical Exam: Filed Vitals:   11/04/13 1546  BP: 99/70  Pulse: 69  Temp: 96.6 F (35.9 C)  TempSrc: Oral  Height: 5\' 2"  (1.575 m)  Weight: 187 lb 9.6 oz (85.095 kg)  SpO2: 96%   Constitutional: Vital signs reviewed.  Patient is a well-developed and well-nourished female in no acute distress and cooperative with exam.   Head: Normocephalic and atraumatic Eyes: PERRL, EOMI, conjunctivae normal, No scleral icterus.  Cardiovascular: RRR, no MRG, DP pulses intact Pulmonary/Chest: Normal respiratory effort Abdominal: Soft. Non-tender, non-distended Musculoskeletal: No joint deformities, edema, bruising, or erythema in bilateral ankles. Decreased ROM in the  right ankle with moderate tenderness. Left ankle with normal ROM and nontender. Neurological: A&O x3, cranial nerve II-XII are grossly intact, non focal neurological exam Skin: Erythema and candida-like rash under both breasts and in groin bilaterally. No skin breakdown noted. No visible wounds.   Psychiatric: Normal mood and affect. Speech and behavior is normal.   Assessment & Plan:   Please refer to Problem List based Assessment and Plan

## 2013-11-04 NOTE — Patient Instructions (Addendum)
Use the Nystatin powder under your breast and in your skin folds where you have the rash. If the rash does not improve over the next few days, please call the clinic.  Be sure to keep they areas clean and dry and wear lose fitting clothing.  Cutaneous Candidiasis Cutaneous candidiasis is a condition in which there is an overgrowth of yeast (candida) on the skin. Yeast normally live on the skin, but in small enough numbers not to cause any symptoms. In certain cases, increased growth of the yeast may cause an actual yeast infection. This kind of infection usually occurs in areas of the skin that are constantly warm and moist, such as the armpits or the groin. Yeast is the most common cause of diaper rash in babies and in people who cannot control their bowel movements (incontinence). CAUSES  The fungus that most often causes cutaneous candidiasis is Candida albicans. Conditions that can increase the risk of getting a yeast infection of the skin include:  Obesity.  Pregnancy.  Diabetes.  Taking antibiotic medicine.  Taking birth control pills.  Taking steroid medicines.  Thyroid disease.  An iron or zinc deficiency.  Problems with the immune system. SYMPTOMS   Red, swollen area of the skin.  Bumps on the skin.  Itchiness. DIAGNOSIS  The diagnosis of cutaneous candidiasis is usually based on its appearance. Light scrapings of the skin may also be taken and viewed under a microscope to identify the presence of yeast. TREATMENT  Antifungal creams may be applied to the infected skin. In severe cases, oral medicines may be needed.  HOME CARE INSTRUCTIONS   Keep your skin clean and dry.  Maintain a healthy weight.  If you have diabetes, keep your blood sugar under control. SEEK IMMEDIATE MEDICAL CARE IF:  Your rash continues to spread despite treatment.  You have a fever, chills, or abdominal pain. Document Released: 07/02/2011 Document Revised: 01/06/2012 Document Reviewed:  07/02/2011 [facility] Patient Information 2014 [place].

## 2013-11-04 NOTE — Assessment & Plan Note (Signed)
Patient with a sprain of her right ankle. X-rays were done  in December and were normal. On exam there is no erythema, edema, or bruising. She does have decreased range of motion and joint appear stiff and is tender to palpation and tender with motion. She states she was given ankle exercises by Dr. Letta Pate' office and was told this ankle and wrapped it with an Ace bandage. I have told her to continue with exercises and continue to elevate and ice the ankle and wrapped, asked sprains can take some time to heal.

## 2013-11-04 NOTE — Assessment & Plan Note (Signed)
Patient states she has just qualified for  [place], and was notified 2 weeks ago. She has not received her insurances card yet, but what she does we'll schedule her for a screening colonoscopy.

## 2013-11-04 NOTE — Assessment & Plan Note (Signed)
Taking her lactulose. She continues to followup with Dr. Leilani Able.

## 2013-11-05 NOTE — Progress Notes (Signed)
Case discussed with Dr. Glenn soon after the resident saw the patient.  We reviewed the resident's history and exam and pertinent patient test results.  I agree with the assessment, diagnosis, and plan of care documented in the resident's note. 

## 2013-11-10 ENCOUNTER — Other Ambulatory Visit: Payer: Self-pay | Admitting: Internal Medicine

## 2013-11-10 ENCOUNTER — Ambulatory Visit
Admission: RE | Admit: 2013-11-10 | Discharge: 2013-11-10 | Disposition: A | Discharge status: Home / Self Care | Payer: No Typology Code available for payment source | Source: Ambulatory Visit | Attending: Physical Medicine and Rehabilitation | Admitting: Physical Medicine and Rehabilitation

## 2013-11-10 DIAGNOSIS — M62830 Muscle spasm of back: Secondary | ICD-10-CM

## 2013-11-10 DIAGNOSIS — M47817 Spondylosis without myelopathy or radiculopathy, lumbosacral region: Secondary | ICD-10-CM

## 2013-11-10 MED ORDER — GADOBENATE DIMEGLUMINE 529 MG/ML IV SOLN
17.0000 mL | Freq: Once | INTRAVENOUS | Status: AC | PRN
  Administered 2013-11-10: 17 mL via INTRAVENOUS

## 2013-11-11 ENCOUNTER — Telehealth: Payer: Self-pay | Admitting: *Deleted

## 2013-11-11 DIAGNOSIS — B372 Candidiasis of skin and nail: Secondary | ICD-10-CM

## 2013-11-11 MED ORDER — FLUCONAZOLE 150 MG PO TABS: 150.0000 mg | ORAL_TABLET | ORAL | Status: DC

## 2013-11-11 NOTE — Telephone Encounter (Signed)
Pt called and stated  The nystatin powder is not helping her yeast infection.   Please call in Rx for pills.  Pt # [phone]

## 2013-11-11 NOTE — Assessment & Plan Note (Signed)
The patient's symptoms are not improved with the Nystatin powder. Will prescribe fluconazole 150mg  once a week x2 weeks. If her symptoms do not improve, she will need to return to the clinic for further evaluation.

## 2013-11-11 NOTE — Telephone Encounter (Signed)
Pt's pharmacy is [facility] on [address].  Listed above

## 2013-11-12 ENCOUNTER — Telehealth: Payer: Self-pay | Admitting: *Deleted

## 2013-11-12 ENCOUNTER — Ambulatory Visit (HOSPITAL_COMMUNITY): Payer: Self-pay | Admitting: Psychiatry

## 2013-11-12 NOTE — Telephone Encounter (Signed)
Right L5-S1 disc protrusion slightly worse than in 2013. May be irritating nerve on the right side but not compressing it. May benefit from epidural steroid injection right L5-S1 transforaminal please scheduled

## 2013-11-12 NOTE — Telephone Encounter (Signed)
Pam Love would like you to review the MRI results and make recommendations.

## 2013-11-16 NOTE — Telephone Encounter (Signed)
Message forwarded to Ardenia to schedule injection.

## 2013-11-23 ENCOUNTER — Other Ambulatory Visit: Payer: Self-pay | Admitting: Physical Medicine and Rehabilitation

## 2013-11-23 ENCOUNTER — Telehealth: Payer: Self-pay

## 2013-11-23 NOTE — Telephone Encounter (Signed)
Patient called to get test results. 

## 2013-11-24 ENCOUNTER — Telehealth: Payer: Self-pay | Admitting: Physical Medicine and Rehabilitation

## 2013-11-24 ENCOUNTER — Other Ambulatory Visit (HOSPITAL_COMMUNITY): Payer: Self-pay | Admitting: *Deleted

## 2013-11-24 DIAGNOSIS — F419 Anxiety disorder, unspecified: Secondary | ICD-10-CM

## 2013-11-24 MED ORDER — LORAZEPAM 0.5 MG PO TABS
0.5000 mg | ORAL_TABLET | Freq: Three times a day (TID) | ORAL | Status: DC | PRN
Start: 2013-11-24 — End: 2014-02-04

## 2013-11-24 NOTE — Telephone Encounter (Signed)
Discussed results of X ray with patient. She is interested in [facility] to see if this will help with symptoms. Will have office set appointment

## 2013-11-25 NOTE — Telephone Encounter (Signed)
Discussed report with patient. She is interested in [facility] and will be set for follow up appointment.

## 2013-11-29 ENCOUNTER — Other Ambulatory Visit: Payer: Self-pay | Admitting: *Deleted

## 2013-11-29 ENCOUNTER — Telehealth: Payer: Self-pay

## 2013-11-29 DIAGNOSIS — K209 Esophagitis, unspecified without bleeding: Secondary | ICD-10-CM

## 2013-11-29 MED ORDER — METHOCARBAMOL 500 MG PO TABS: ORAL_TABLET | ORAL | Status: DC

## 2013-11-29 NOTE — Telephone Encounter (Signed)
Pt states she has changed to [facility].

## 2013-11-29 NOTE — Telephone Encounter (Signed)
Patient called requesting robaxin refill.  This was escribed and patient was reminded of her appointment time.

## 2013-11-30 ENCOUNTER — Ambulatory Visit: Payer: No Typology Code available for payment source | Admitting: Physical Medicine & Rehabilitation

## 2013-11-30 MED ORDER — PANTOPRAZOLE SODIUM 40 MG PO TBEC
40.0000 mg | DELAYED_RELEASE_TABLET | Freq: Every day | ORAL | Status: DC
Start: 2013-11-29 — End: 2013-12-28

## 2013-11-30 MED ORDER — TRAMADOL HCL 50 MG PO TABS: 50.0000 mg | ORAL_TABLET | Freq: Four times a day (QID) | ORAL | Status: DC | PRN

## 2013-11-30 NOTE — Telephone Encounter (Signed)
Rx for tramadol called in

## 2013-12-02 ENCOUNTER — Ambulatory Visit (HOSPITAL_COMMUNITY): Payer: No Typology Code available for payment source | Admitting: Psychiatry

## 2013-12-10 ENCOUNTER — Ambulatory Visit: Payer: Self-pay | Admitting: Physical Medicine & Rehabilitation

## 2013-12-13 ENCOUNTER — Other Ambulatory Visit (HOSPITAL_COMMUNITY): Payer: Self-pay | Admitting: *Deleted

## 2013-12-13 NOTE — Telephone Encounter (Signed)
Pt left message 2/13 @ 1554:VM recv'd 2/16 @ 0930:  Had transferred all medications to [facility].Still needs Seroquel prescription.Only got #30, but takes 2/day.  Informed pt that per entry in patient chart 09/22/2013 10:46 AM          Contacted pt. Informed her of Dr. Karen Chafe order: Seroquel 25 mg BID Pt asked that RX be sent to [facility] as medications cheaper  Norma Fredrickson, MD at 09/21/2013 10:49 AM: Please put pt  backon seroquel  25 mg BID  Per that order--Seroquel 25 mg po BID, #60, with one refill sent to [facility] on 09/22/13   Patient states she never picked up this prescription from  November 2014 Instructed to contact [place] and ask that RX be transferred to [facility]

## 2013-12-16 ENCOUNTER — Telehealth (HOSPITAL_COMMUNITY): Payer: Self-pay | Admitting: *Deleted

## 2013-12-16 DIAGNOSIS — F411 Generalized anxiety disorder: Secondary | ICD-10-CM

## 2013-12-16 MED ORDER — QUETIAPINE FUMARATE 25 MG PO TABS: 25.0000 mg | ORAL_TABLET | Freq: Two times a day (BID) | ORAL | Status: DC

## 2013-12-16 NOTE — Telephone Encounter (Signed)
Pt left QZ:ESPQZ about Seroquel being changed to [facility].Needs new prescription sent to them.Now takes twice daily,old RX is one daily.

## 2013-12-28 ENCOUNTER — Other Ambulatory Visit: Payer: Self-pay | Admitting: *Deleted

## 2013-12-28 ENCOUNTER — Other Ambulatory Visit: Payer: Self-pay | Admitting: Gastroenterology

## 2013-12-28 DIAGNOSIS — K209 Esophagitis, unspecified without bleeding: Secondary | ICD-10-CM

## 2013-12-28 DIAGNOSIS — B182 Chronic viral hepatitis C: Secondary | ICD-10-CM

## 2013-12-28 NOTE — Telephone Encounter (Signed)
Dr Eulas Post please review the chronulac dosing, she wants 3 months worth, please check the numbers to be sure

## 2013-12-29 MED ORDER — PROPRANOLOL HCL 20 MG PO TABS: 20.0000 mg | ORAL_TABLET | Freq: Every day | ORAL | Status: DC

## 2013-12-29 MED ORDER — LACTULOSE 10 GM/15ML PO SOLN: 30.0000 g | ORAL | Status: DC

## 2013-12-29 MED ORDER — PANTOPRAZOLE SODIUM 40 MG PO TBEC: 40.0000 mg | DELAYED_RELEASE_TABLET | Freq: Every day | ORAL | Status: DC

## 2013-12-29 MED ORDER — TRAMADOL HCL 50 MG PO TABS: 50.0000 mg | ORAL_TABLET | Freq: Four times a day (QID) | ORAL | Status: DC | PRN

## 2013-12-31 ENCOUNTER — Ambulatory Visit (HOSPITAL_COMMUNITY): Payer: No Typology Code available for payment source | Admitting: Psychiatry

## 2014-01-04 ENCOUNTER — Ambulatory Visit
Admission: RE | Admit: 2014-01-04 | Discharge: 2014-01-04 | Disposition: A | Discharge status: Home / Self Care | Payer: No Typology Code available for payment source | Source: Ambulatory Visit | Attending: Gastroenterology | Admitting: Gastroenterology

## 2014-01-04 DIAGNOSIS — B182 Chronic viral hepatitis C: Secondary | ICD-10-CM

## 2014-01-05 ENCOUNTER — Encounter: Payer: Self-pay | Admitting: Internal Medicine

## 2014-01-07 ENCOUNTER — Other Ambulatory Visit: Payer: Self-pay | Admitting: Internal Medicine

## 2014-01-11 ENCOUNTER — Ambulatory Visit: Payer: No Typology Code available for payment source | Admitting: Physical Medicine & Rehabilitation

## 2014-01-11 ENCOUNTER — Encounter: Payer: Medicaid Other | Attending: Physical Medicine and Rehabilitation

## 2014-01-11 DIAGNOSIS — R269 Unspecified abnormalities of gait and mobility: Secondary | ICD-10-CM | POA: Insufficient documentation

## 2014-01-11 DIAGNOSIS — M545 Low back pain, unspecified: Secondary | ICD-10-CM | POA: Insufficient documentation

## 2014-01-11 DIAGNOSIS — R209 Unspecified disturbances of skin sensation: Secondary | ICD-10-CM | POA: Insufficient documentation

## 2014-01-11 DIAGNOSIS — IMO0001 Reserved for inherently not codable concepts without codable children: Secondary | ICD-10-CM | POA: Insufficient documentation

## 2014-01-11 DIAGNOSIS — M25579 Pain in unspecified ankle and joints of unspecified foot: Secondary | ICD-10-CM | POA: Insufficient documentation

## 2014-01-11 DIAGNOSIS — M5380 Other specified dorsopathies, site unspecified: Secondary | ICD-10-CM | POA: Insufficient documentation

## 2014-01-11 DIAGNOSIS — M542 Cervicalgia: Secondary | ICD-10-CM | POA: Insufficient documentation

## 2014-01-11 DIAGNOSIS — M62838 Other muscle spasm: Secondary | ICD-10-CM | POA: Insufficient documentation

## 2014-01-19 ENCOUNTER — Other Ambulatory Visit: Payer: Self-pay

## 2014-01-20 ENCOUNTER — Telehealth (HOSPITAL_COMMUNITY): Payer: Self-pay | Admitting: *Deleted

## 2014-01-20 DIAGNOSIS — F419 Anxiety disorder, unspecified: Secondary | ICD-10-CM

## 2014-01-20 DIAGNOSIS — F411 Generalized anxiety disorder: Secondary | ICD-10-CM

## 2014-01-20 NOTE — Telephone Encounter (Signed)
Patient left [id number] refill of Seroquel and Lorazepam  Contacted patient.Advised pt request will be given to MD for approval and refill as last appt 08/25/13, and she has not attended the last 3 appointments.Verbalized understanding

## 2014-01-21 NOTE — Telephone Encounter (Signed)
Please give  Her seroquel 25 bid 60  Pills  No refills   No ativan

## 2014-01-23 ENCOUNTER — Emergency Department (HOSPITAL_COMMUNITY): Payer: Medicaid Other

## 2014-01-23 ENCOUNTER — Encounter (HOSPITAL_COMMUNITY): Payer: Self-pay | Admitting: Emergency Medicine

## 2014-01-23 ENCOUNTER — Emergency Department (HOSPITAL_COMMUNITY)
Admission: EM | Admit: 2014-01-23 | Discharge: 2014-01-23 | Disposition: A | Discharge status: Home / Self Care | Payer: Medicaid Other | Attending: Emergency Medicine | Admitting: Emergency Medicine

## 2014-01-23 DIAGNOSIS — M549 Dorsalgia, unspecified: Secondary | ICD-10-CM | POA: Insufficient documentation

## 2014-01-23 DIAGNOSIS — F411 Generalized anxiety disorder: Secondary | ICD-10-CM | POA: Insufficient documentation

## 2014-01-23 DIAGNOSIS — Z860100 Personal history of colon polyps, unspecified: Secondary | ICD-10-CM | POA: Insufficient documentation

## 2014-01-23 DIAGNOSIS — J069 Acute upper respiratory infection, unspecified: Secondary | ICD-10-CM | POA: Insufficient documentation

## 2014-01-23 DIAGNOSIS — J441 Chronic obstructive pulmonary disease with (acute) exacerbation: Secondary | ICD-10-CM | POA: Insufficient documentation

## 2014-01-23 DIAGNOSIS — Z87891 Personal history of nicotine dependence: Secondary | ICD-10-CM | POA: Insufficient documentation

## 2014-01-23 DIAGNOSIS — F3289 Other specified depressive episodes: Secondary | ICD-10-CM | POA: Insufficient documentation

## 2014-01-23 DIAGNOSIS — Z8669 Personal history of other diseases of the nervous system and sense organs: Secondary | ICD-10-CM | POA: Insufficient documentation

## 2014-01-23 DIAGNOSIS — F329 Major depressive disorder, single episode, unspecified: Secondary | ICD-10-CM | POA: Insufficient documentation

## 2014-01-23 DIAGNOSIS — Z79899 Other long term (current) drug therapy: Secondary | ICD-10-CM | POA: Insufficient documentation

## 2014-01-23 DIAGNOSIS — E669 Obesity, unspecified: Secondary | ICD-10-CM | POA: Insufficient documentation

## 2014-01-23 DIAGNOSIS — K766 Portal hypertension: Secondary | ICD-10-CM | POA: Insufficient documentation

## 2014-01-23 DIAGNOSIS — Z8541 Personal history of malignant neoplasm of cervix uteri: Secondary | ICD-10-CM | POA: Insufficient documentation

## 2014-01-23 DIAGNOSIS — IMO0001 Reserved for inherently not codable concepts without codable children: Secondary | ICD-10-CM

## 2014-01-23 DIAGNOSIS — Z8619 Personal history of other infectious and parasitic diseases: Secondary | ICD-10-CM | POA: Insufficient documentation

## 2014-01-23 MED ORDER — IPRATROPIUM BROMIDE 0.02 % IN SOLN
0.5000 mg | Freq: Once | RESPIRATORY_TRACT | Status: AC
  Administered 2014-01-23: 0.5 mg via RESPIRATORY_TRACT
  Filled 2014-01-23: qty 2.5

## 2014-01-23 MED ORDER — ALBUTEROL SULFATE (2.5 MG/3ML) 0.083% IN NEBU
5.0000 mg | INHALATION_SOLUTION | Freq: Once | RESPIRATORY_TRACT | Status: AC
  Administered 2014-01-23: 5 mg via RESPIRATORY_TRACT
  Filled 2014-01-23: qty 6

## 2014-01-23 MED ORDER — PREDNISONE 20 MG PO TABS: 40.0000 mg | ORAL_TABLET | Freq: Every day | ORAL | Status: DC

## 2014-01-23 MED ORDER — CEFUROXIME AXETIL 250 MG PO TABS: 250.0000 mg | ORAL_TABLET | Freq: Two times a day (BID) | ORAL | Status: DC

## 2014-01-23 MED ORDER — ZOLPIDEM TARTRATE 5 MG PO TABS: 5.0000 mg | ORAL_TABLET | Freq: Every evening | ORAL | Status: DC | PRN

## 2014-01-23 NOTE — ED Provider Notes (Signed)
CSN: [id number]     Arrival date & time 01/23/14  1317 History   First MD Initiated Contact with Patient 01/23/14 1341    This chart was scribed for non-physician practitioner, Margarita Mail, working with Wandra Arthurs, MD by Terressa Koyanagi, ED Scribe. This patient was seen in room WTR9/WTR9 and the patient's care was started at 2:26 PM.  PCP: Otho Bellows, MD  Chief Complaint  Patient presents with  . Nasal Congestion  . Back Pain  . Anxiety   HPI HPI Comments: [name] [name] is a 53 y.o. female, with a history of COPD, chronic hepatitis C, cervical cancer, SOB, portal HTN, alcoholic cirrhosis, and heart palpitations, who presents to the Emergency Department complaining of multiple complaints.   For her first complaint, pt complains of ongoing heightened anxiety. Pt also reports she has been out of her Ativan for the past two days. Pt reports that she has a follow-up with her prescribing physician tomorrow.   For her secondary complaint, pt complains of sore throat onset 2-3 days. Pt also complains of associated rhinorrhea with green discharge, SOB, and left sided headache onset 2-3 days ago. Pt also reports she had a fever of 101 degrees yesterday.    Past Medical History  Diagnosis Date  . Alcoholic cirrhosis   . COPD (chronic obstructive pulmonary disease)   . Heart palpitations   . Anxiety   . Portal hypertension   . Esophageal varices   . Cholelithiasis   . Splenomegaly   . Fatty liver   . Esophagitis 2010  . Tubulovillous adenoma of colon   . Visual hallucination     since 09/2010/notes 10/30/2012  . Hepatitis C     chronic hepatitis C and Steatohepatitis (hep grade 2, stage 2-3) per 05/13/08 liver biopsy  . Obesity   . cervical Cancer 1999  . Depression   . Shortness of breath    Past Surgical History  Procedure Laterality Date  . Cervical cancer surgery    . Abdominal hysterectomy    . Tubal ligation    . Esophagogastroduodenoscopy  10/18/2011   Procedure: ESOPHAGOGASTRODUODENOSCOPY (EGD);  Surgeon: Owens Loffler, MD;  Location: [facility];  Service: Endoscopy;  Laterality: N/A;  . Hysterectomy     Family History  Problem Relation Age of Onset  . Cervical cancer Sister   . Breast cancer Maternal Aunt   . Heart disease Father 71    died from MI  . Breast cancer Father   . Cervical cancer Daughter    History  Substance Use Topics  . Smoking status: Former Smoker -- 0.10 packs/day for 40 years    Types: Cigarettes    Quit date: 10/28/2010  . Smokeless tobacco: Never Used  . Alcohol Use: Yes     Comment: months since last drink   OB History   Grav Para Term Preterm Abortions TAB SAB Ect Mult Living   3 2   1  1   2      Review of Systems  Constitutional: Positive for fever.  Respiratory: Positive for cough and wheezing.       Allergies  Amitriptyline  Home Medications   Current Outpatient Rx  Name  Route  Sig  Dispense  Refill  . albuterol (PROVENTIL HFA;VENTOLIN HFA) 108 (90 BASE) MCG/ACT inhaler   Inhalation   Inhale 2 puffs into the lungs every 6 (six) hours as needed for wheezing.         Marland Kitchen albuterol (PROVENTIL) (2.5 MG/3ML) 0.083% nebulizer  solution   Nebulization   Take 2.5 mg by nebulization 2 (two) times daily.         . furosemide (LASIX) 20 MG tablet   Oral   Take 20 mg by mouth 2 (two) times daily.         . Ipratropium-Albuterol (COMBIVENT) 20-100 MCG/ACT AERS respimat   Inhalation   Inhale 1 puff into the lungs every 6 (six) hours as needed for wheezing.   1 Inhaler   11   . lactulose (CHRONULAC) 10 GM/15ML solution   Oral   Take 30 g by mouth every other day.         Marland Kitchen LORazepam (ATIVAN) 0.5 MG tablet   Oral   Take 1 tablet (0.5 mg total) by mouth every 8 (eight) hours as needed for anxiety.   90 tablet   1     No Early Refills Please   . methocarbamol (ROBAXIN) 500 MG tablet   Oral   Take 500 mg by mouth 2 (two) times daily as needed for muscle spasms.         .  pantoprazole (PROTONIX) 40 MG tablet   Oral   Take 40 mg by mouth daily.         . propranolol (INDERAL) 20 MG tablet   Oral   Take 20 mg by mouth daily.         . QUEtiapine (SEROQUEL) 25 MG tablet   Oral   Take 25 mg by mouth 2 (two) times daily.         . traMADol (ULTRAM) 50 MG tablet   Oral   Take 50 mg by mouth every 6 (six) hours as needed for moderate pain.          Triage Vitals: BP 116/98  Pulse 116  Temp(Src) 98.1 F (36.7 C) (Oral)  Resp 14  SpO2 97% Physical Exam  Nursing note and vitals reviewed. Constitutional: She is oriented to person, place, and time. She appears well-developed and well-nourished. No distress.  HENT:  Head: Normocephalic and atraumatic.  Mild pharynx erythema   Eyes: EOM are normal.  Neck: Neck supple. No tracheal deviation present.  Cardiovascular: Normal rate.   Pulmonary/Chest: Effort normal. No respiratory distress.  Diffuse expiratory rhonchi. Increased expiratory phase.   Musculoskeletal: Normal range of motion.  Neurological: She is alert and oriented to person, place, and time.  Skin: Skin is warm and dry.  Psychiatric: She has a normal mood and affect. Her behavior is normal.    ED Course  Procedures (including critical care time) DIAGNOSTIC STUDIES: Oxygen Saturation is 97% on RA, adequate by my interpretation.    COORDINATION OF CARE:  2:31 PM-Discussed treatment plan which includes imaging with pt at bedside and pt agreed to plan. Also informed pt will not prescribe Ativan and encouraged pt to follow-up with her provider, pt understands the plan.   Labs Review Labs Reviewed - No data to display Imaging Review No results found.   EKG Interpretation None      MDM   Final diagnoses:  URI (upper respiratory infection)  COPD bronchitis   BP 118/96  Pulse 98  Temp(Src) 98.1 F (36.7 C) (Oral)  Resp 18  SpO2 98% No active signs of benzo withdrawal. I discussed that we do not treat chronic anxiety  and that i would not refill her ativan. Chart review shows that she has missed the last 3 visits with her prescribing physician. She reports fever. Will treat for COPD  exacerbation. Breathing improved after neb tx.   I personally performed the services described in this documentation, which was scribed in my presence. The recorded information has been reviewed and is accurate.      Margarita Mail, PA-C 01/24/14 [phone]

## 2014-01-23 NOTE — ED Notes (Addendum)
Pt from home with multiple complaints. She states her copious amounts of green nasal congestion causes her anxiety. She is out of her anxiety medication  would like a refill. She is c/o of chronic back pain.

## 2014-01-23 NOTE — Discharge Instructions (Signed)
Please follow up with your primary care doctor concerning your need for anxiety medicine.  Chronic Obstructive Pulmonary Disease Chronic obstructive pulmonary disease (COPD) is a common lung condition in which airflow from the lungs is limited. COPD is a general term that can be used to describe many different lung problems that limit airflow, including both chronic bronchitis and emphysema. If you have COPD, your lung function will probably never return to normal, but there are measures you can take to improve lung function and make yourself feel better.  CAUSES   Smoking (common).   Exposure to secondhand smoke.   Genetic problems.  Chronic inflammatory lung diseases or recurrent infections. SYMPTOMS   Shortness of breath, especially with physical activity.   Deep, persistent (chronic) cough with a large amount of thick mucus.   Wheezing.   Rapid breaths (tachypnea).   Gray or bluish discoloration (cyanosis) of the skin, especially in fingers, toes, or lips.   Fatigue.   Weight loss.   Frequent infections or episodes when breathing symptoms become much worse (exacerbations).   Chest tightness. DIAGNOSIS  Your healthcare provider will take a medical history and perform a physical examination to make the initial diagnosis. Additional tests for COPD may include:   Lung (pulmonary) function tests.  Chest X-ray.  CT scan.  Blood tests. TREATMENT  Treatment available to help you feel better when you have COPD include:   Inhaler and nebulizer medicines. These help manage the symptoms of COPD and make your breathing more comfortable  Supplemental oxygen. Supplemental oxygen is only helpful if you have a low oxygen level in your blood.   Exercise and physical activity. These are beneficial for nearly all people with COPD. Some people may also benefit from a pulmonary rehabilitation program. HOME CARE INSTRUCTIONS   Take all medicines (inhaled or pills) as  directed by your health care provider.  Only take over-the-counter or prescription medicines for pain, fever, or discomfort as directed by your health care provider.   Avoid over-the-counter medicines or cough syrups that dry up your airway (such as antihistamines) and slow down the elimination of secretions unless instructed otherwise by your healthcare provider.   If you are a smoker, the most important thing that you can do is stop smoking. Continuing to smoke will cause further lung damage and breathing trouble. Ask your health care provider for help with quitting smoking. He or she can direct you to community resources or hospitals that provide support.  Avoid exposure to irritants such as smoke, chemicals, and fumes that aggravate your breathing.  Use oxygen therapy and pulmonary rehabilitation if directed by your health care provider. If you require home oxygen therapy, ask your healthcare provider whether you should purchase a pulse oximeter to measure your oxygen level at home.   Avoid contact with individuals who have a contagious illness.  Avoid extreme temperature and humidity changes.  Eat healthy foods. Eating smaller, more frequent meals and resting before meals may help you maintain your strength.  Stay active, but balance activity with periods of rest. Exercise and physical activity will help you maintain your ability to do things you want to do.  Preventing infection and hospitalization is very important when you have COPD. Make sure to receive all the vaccines your health care provider recommends, especially the pneumococcal and influenza vaccines. Ask your healthcare provider whether you need a pneumonia vaccine.  Learn and use relaxation techniques to manage stress.  Learn and use controlled breathing techniques as directed by your  health care provider. Controlled breathing techniques include:   Pursed lip breathing. Start by breathing in (inhaling) through your  nose for 1 second. Then, purse your lips as if you were going to whistle and breathe out (exhale) through the pursed lips for 2 seconds.   Diaphragmatic breathing. Start by putting one hand on your abdomen just above your waist. Inhale slowly through your nose. The hand on your abdomen should move out. Then purse your lips and exhale slowly. You should be able to feel the hand on your abdomen moving in as you exhale.   Learn and use controlled coughing to clear mucus from your lungs. Controlled coughing is a series of short, progressive coughs. The steps of controlled coughing are:  1. Lean your head slightly forward.  2. Breathe in deeply using diaphragmatic breathing.  3. Try to hold your breath for 3 seconds.  4. Keep your mouth slightly open while coughing twice.  5. Spit any mucus out into a tissue.  6. Rest and repeat the steps once or twice as needed. SEEK MEDICAL CARE IF:   You are coughing up more mucus than usual.   There is a change in the color or thickness of your mucus.   Your breathing is more labored than usual.   Your breathing is faster than usual.  SEEK IMMEDIATE MEDICAL CARE IF:   You have shortness of breath while you are resting.   You have shortness of breath that prevents you from:  Being able to talk.   Performing your usual physical activities.   You have chest pain lasting longer than 5 minutes.   Your skin color is more cyanotic than usual.  You measure low oxygen saturations for longer than 5 minutes with a pulse oximeter. MAKE SURE YOU:   Understand these instructions.  Will watch your condition.  Will get help right away if you are not doing well or get worse. Document Released: 07/24/2005 Document Revised: 08/04/2013 Document Reviewed: 06/10/2013 [facility] Patient Information 2014 [place], [place].

## 2014-01-24 ENCOUNTER — Telehealth (HOSPITAL_COMMUNITY): Payer: Self-pay | Admitting: *Deleted

## 2014-01-24 DIAGNOSIS — F411 Generalized anxiety disorder: Secondary | ICD-10-CM

## 2014-01-24 MED ORDER — QUETIAPINE FUMARATE 25 MG PO TABS: 25.0000 mg | ORAL_TABLET | Freq: Two times a day (BID) | ORAL | Status: DC

## 2014-01-24 NOTE — Telephone Encounter (Signed)
Patient left message:Dr.Plovsky was going to send prescription for Seroquel on Friday.Never sent it.Pharmacy gave her enough Seroquel to last until Monday.Went to ED Sunday eve with panic attack.Was given 1 Ativan,told to call MD today.States she was told she could not have medicine since she had missed appointments.Has appt on 4/10.   Norma Fredrickson, MD at 01/21/2014  9:40 AM      Status: Signed            Please give  Her seroquel 25 bid 60  Pills  No refills   No ativan    Contacted patient per Dr.Plovsky's direction to inform her he would refill Seroquel.No refill for Ativan will be given, per his orders. Prescription resent to [facility] as no RX received by them 01/21/14.

## 2014-01-26 NOTE — ED Provider Notes (Signed)
Medical screening examination/treatment/procedure(s) were performed by non-physician practitioner and as supervising physician I was immediately available for consultation/collaboration.   EKG Interpretation None        Wandra Arthurs, MD 01/26/14 1500

## 2014-01-27 ENCOUNTER — Encounter: Payer: Self-pay | Admitting: Internal Medicine

## 2014-01-27 ENCOUNTER — Ambulatory Visit (INDEPENDENT_AMBULATORY_CARE_PROVIDER_SITE_OTHER): Payer: Medicaid Other | Admitting: Internal Medicine

## 2014-01-27 VITALS — BP 142/97 | HR 88 | Temp 97.1°F | Ht 62.0 in | Wt 191.5 lb

## 2014-01-27 DIAGNOSIS — I8510 Secondary esophageal varices without bleeding: Secondary | ICD-10-CM | POA: Insufficient documentation

## 2014-01-27 DIAGNOSIS — C801 Malignant (primary) neoplasm, unspecified: Secondary | ICD-10-CM

## 2014-01-27 DIAGNOSIS — I10 Essential (primary) hypertension: Secondary | ICD-10-CM

## 2014-01-27 DIAGNOSIS — Z0000 Encounter for general adult medical examination without abnormal findings: Secondary | ICD-10-CM

## 2014-01-27 DIAGNOSIS — F419 Anxiety disorder, unspecified: Secondary | ICD-10-CM

## 2014-01-27 DIAGNOSIS — K7460 Unspecified cirrhosis of liver: Secondary | ICD-10-CM

## 2014-01-27 DIAGNOSIS — B182 Chronic viral hepatitis C: Secondary | ICD-10-CM

## 2014-01-27 DIAGNOSIS — F411 Generalized anxiety disorder: Secondary | ICD-10-CM

## 2014-01-27 MED ORDER — PROPRANOLOL HCL 20 MG PO TABS: 20.0000 mg | ORAL_TABLET | Freq: Every day | ORAL | Status: DC

## 2014-01-27 MED ORDER — LACTULOSE 10 GM/15ML PO SOLN: 30.0000 g | ORAL | Status: DC

## 2014-01-27 MED ORDER — TRAMADOL HCL 50 MG PO TABS: 50.0000 mg | ORAL_TABLET | Freq: Three times a day (TID) | ORAL | Status: DC | PRN

## 2014-01-27 NOTE — Progress Notes (Signed)
INTERNAL MEDICINE TEACHING ATTENDING ADDENDUM - Nischal Narendra, MD: I reviewed and discussed at the time of visit with the resident Dr. Glenn, the patient's medical history, physical examination, diagnosis and results of tests and treatment and I agree with the patient's care as documented 

## 2014-01-27 NOTE — Assessment & Plan Note (Signed)
Colonoscopy scheduled for June '15.

## 2014-01-27 NOTE — Assessment & Plan Note (Addendum)
Next f/u is with Dr. Casimiro Needle on 4/10. No Ativan given today as she needs to f/u with Dr. Illene Silver for this. She has been out of the medication since 3/26, so I am not concerned for possible withdrawal after being off the medication for 1 week.

## 2014-01-27 NOTE — Assessment & Plan Note (Addendum)
BP Readings from Last 3 Encounters:  01/27/14 142/97  01/23/14 118/96  11/04/13 99/70    Lab Results  Component Value Date   NA 139 05/08/2013   K 3.6 05/08/2013   CREATININE 0.49* 05/08/2013    Assessment: Blood pressure control: mildly elevated (in the setting of agitation) Progress toward BP goal:    Comments: BP higher than normal Pt w/o Ativan since 3/26 and c/o anxiety and trouble sleeping, which I am attributing to this elevated BP.   Plan: Medications:  Propranolol 2/2 cirrhosis.  Educational resources provided: Therapist, sports tools provided:   Other plans: f/u 3 mo

## 2014-01-27 NOTE — Assessment & Plan Note (Addendum)
Refilled her lactulose and propranolol today. She endorses compliance with these meds.

## 2014-01-27 NOTE — Assessment & Plan Note (Addendum)
Pt will need GYN f/u within the next few months. Pap w/ LGSIL w/ neg HR HPV on 02/17/13, colposcopy normal 04/07/13.  Will discuss at her next clinic visit.

## 2014-01-27 NOTE — Progress Notes (Signed)
[name], female   DOB: 08-Feb-1961, 53 y.o.   MRN: [id number]  Subjective:   [name] female   DOB: 17-Jul-1961 53 y.o.   MRN: [id number]  HPI: [name] is a 53 y.o. F w/ PMH cirrhosis from chronic hep C and alcohol abuse, significant anxiety, and a h/o visual hallucinations presents to the clinic for a follow up appointment after being seen in the ED.  She went to the ED on 3/29 and was treated for a URI/COPD exacerbation and was given a breathing treatment in the ED and prednisone 40mg  x5 days, and Ceftin 250mg   BID x10 days which improved her sinus drainage and SOB. She also states that she has been having left ear pain, which is improving with the abx. She is still taking the abx. She denies fevers.  Apparently she requested Ativan while in the ED but was denied a refill as she had missed her last 3 psychiatry appointments with Dr. Casimiro Needle. She also called Dr. Amalia Greenhouse and he told her that she needed to f/u with him before he would refill her Ativan. She has an appt with him on 4/10. Today she states that she needs Ativan for her nerves and to help her sleep as she is not sleeping well w/o the Ativan. She states that she has been out of the Ativan since about 3/26.   Her Seroquel was refilled by Dr. Casimiro Needle in March and she endorses compliance with it.  She denies visual hallucinations or hearing voices.     Past Medical History  Diagnosis Date  . Alcoholic cirrhosis   . COPD (chronic obstructive pulmonary disease)   . Heart palpitations   . Anxiety   . Portal hypertension   . Esophageal varices   . Cholelithiasis   . Splenomegaly   . Fatty liver   . Esophagitis 2010  . Tubulovillous adenoma of colon   . Visual hallucination     since 09/2010/notes 10/30/2012  . Hepatitis C     chronic hepatitis C and Steatohepatitis (hep grade 2, stage 2-3) per 05/13/08 liver biopsy  . Obesity   . cervical Cancer 1999  . Depression    . Shortness of breath    Current Outpatient Prescriptions  Medication Sig Dispense Refill  . albuterol (PROVENTIL HFA;VENTOLIN HFA) 108 (90 BASE) MCG/ACT inhaler Inhale 2 puffs into the lungs every 6 (six) hours as needed for wheezing.      Marland Kitchen albuterol (PROVENTIL) (2.5 MG/3ML) 0.083% nebulizer solution Take 2.5 mg by nebulization 2 (two) times daily.      . cefUROXime (CEFTIN) 250 MG tablet Take 1 tablet (250 mg total) by mouth 2 (two) times daily with a meal.  20 tablet  0  . furosemide (LASIX) 20 MG tablet Take 20 mg by mouth 2 (two) times daily.      . Ipratropium-Albuterol (COMBIVENT) 20-100 MCG/ACT AERS respimat Inhale 1 puff into the lungs every 6 (six) hours as needed for wheezing.  1 Inhaler  11  . lactulose (CHRONULAC) 10 GM/15ML solution Take 30 g by mouth every other day.      Marland Kitchen LORazepam (ATIVAN) 0.5 MG tablet Take 1 tablet (0.5 mg total) by mouth every 8 (eight) hours as needed for anxiety.  90 tablet  1  . methocarbamol (ROBAXIN) 500 MG tablet Take 500 mg by mouth 2 (two) times daily as needed for muscle spasms.      . pantoprazole (PROTONIX) 40 MG tablet Take 40 mg by mouth  daily.      . predniSONE (DELTASONE) 20 MG tablet Take 2 tablets (40 mg total) by mouth daily.  10 tablet  0  . propranolol (INDERAL) 20 MG tablet Take 20 mg by mouth daily.      . QUEtiapine (SEROQUEL) 25 MG tablet Take 1 tablet (25 mg total) by mouth 2 (two) times daily.  60 tablet  0  . traMADol (ULTRAM) 50 MG tablet Take 50 mg by mouth every 6 (six) hours as needed for moderate pain.      Marland Kitchen zolpidem (AMBIEN) 5 MG tablet Take 1 tablet (5 mg total) by mouth at bedtime as needed for sleep.  1 tablet  0   No current facility-administered medications for this visit.   Family History  Problem Relation Age of Onset  . Cervical cancer Sister   . Breast cancer Maternal Aunt   . Heart disease Father 64    died from MI  . Breast cancer Father   . Cervical cancer Daughter    History   Social History  .  Marital Status: Married    Spouse Name: N/A    Number of Children: N/A  . Years of Education: N/A   Social History Main Topics  . Smoking status: Former Smoker -- 0.10 packs/day for 40 years    Types: Cigarettes    Quit date: 10/28/2010  . Smokeless tobacco: Never Used  . Alcohol Use: Yes     Comment: months since last drink  . Drug Use: No  . Sexual Activity: None   Other Topics Concern  . None   Social History Narrative  . None   Review of Systems: A 12 point ROS was performed; pertinent positives and negatives were noted in the HPI   Objective:  Physical Exam: Filed Vitals:   01/27/14 1533  BP: 142/97  Pulse: 88  Temp: 97.1 F (36.2 C)  TempSrc: Oral  Height: 5\' 2"  (1.575 m)  Weight: 191 lb 8 oz (86.864 kg)  SpO2: 95%   Constitutional: Vital signs reviewed.  Patient is a well-developed and well-nourished female in no acute distress and cooperative with exam.  Head: Normocephalic and atraumatic Ear: TM normal bilaterally, mild erythema of the left auditory canal  Eyes: PERRL, EOMI  Cardiovascular: RRR, no MRG, radial pulses symmetric and intact bilaterally Pulmonary/Chest: Normal respiratory effort, CTAB, no wheezes, rales, or rhonchi Musculoskeletal: Moves all 4 extremities Neurological: A&O x3, no focal neurological deficit  Psychiatric: Overall normal and affect, some agitation over being out of Ativan. Speech and behavior is normal.   Assessment & Plan:   Please refer to Problem List based Assessment and Plan  Of note, the patient's ride was threatening to leave the patient, and her visit was cut short.  She is being seen by Dr. Leilani Able for her Hep C. I am considering sending her to [facility]'s new hep C clinic, which will be more convenient for the patient as she is having to travel to [facility] to see Dr. Leilani Able.  I will address this at her next visit.

## 2014-02-01 ENCOUNTER — Telehealth: Payer: Self-pay | Admitting: *Deleted

## 2014-02-01 DIAGNOSIS — G8929 Other chronic pain: Secondary | ICD-10-CM

## 2014-02-01 NOTE — Telephone Encounter (Signed)
Pt calls and is somewhat upset, states her tramadol called in last week was for only 90 and she gets 120 and you did not talk to her about cutting it and she needs the other 30

## 2014-02-02 ENCOUNTER — Ambulatory Visit (INDEPENDENT_AMBULATORY_CARE_PROVIDER_SITE_OTHER): Payer: Medicaid Other | Admitting: Internal Medicine

## 2014-02-02 ENCOUNTER — Encounter: Payer: Self-pay | Admitting: Internal Medicine

## 2014-02-02 VITALS — BP 115/86 | HR 85 | Temp 98.0°F | Ht 62.0 in | Wt 187.6 lb

## 2014-02-02 DIAGNOSIS — F411 Generalized anxiety disorder: Secondary | ICD-10-CM

## 2014-02-02 DIAGNOSIS — F419 Anxiety disorder, unspecified: Secondary | ICD-10-CM

## 2014-02-02 DIAGNOSIS — B372 Candidiasis of skin and nail: Secondary | ICD-10-CM

## 2014-02-02 DIAGNOSIS — G8929 Other chronic pain: Secondary | ICD-10-CM

## 2014-02-02 DIAGNOSIS — R739 Hyperglycemia, unspecified: Secondary | ICD-10-CM

## 2014-02-02 DIAGNOSIS — M549 Dorsalgia, unspecified: Secondary | ICD-10-CM

## 2014-02-02 LAB — POCT GLYCOSYLATED HEMOGLOBIN (HGB A1C): HEMOGLOBIN A1C: 5.5

## 2014-02-02 LAB — GLUCOSE, CAPILLARY: GLUCOSE-CAPILLARY: 160 mg/dL — AB (ref 70–99)

## 2014-02-02 MED ORDER — FLUCONAZOLE 150 MG PO TABS: 150.0000 mg | ORAL_TABLET | Freq: Once | ORAL | Status: DC

## 2014-02-02 MED ORDER — TRAMADOL HCL 50 MG PO TABS: 50.0000 mg | ORAL_TABLET | Freq: Three times a day (TID) | ORAL | Status: DC | PRN

## 2014-02-02 NOTE — Patient Instructions (Signed)
-  Follow up with your Psychiatrist for refill on Ativan and for something for sleep.  -Take Fluconazole 150mg  tablet today and another tablet next week on Wednesday.  -Follow up with [place] as needed if the rash does not improve.  -Follow up with Dr. Eulas Post in 2 months.

## 2014-02-03 MED ORDER — TRAMADOL HCL 50 MG PO TABS: 50.0000 mg | ORAL_TABLET | Freq: Every day | ORAL | Status: DC

## 2014-02-03 NOTE — Progress Notes (Signed)
   Subjective:    [name], female    DOB: 1961/02/21, 53 y.o.   MRN: [id number]  Rash Pertinent negatives include no cough, fatigue or fever.   [name] [name] is a 53 yr old woman with PMH significant for anxiety, hepatitis C, and chronic back pain, who presents for refill of Tramadol and evaluation of a rash. She was seen by her PCP recently and was given Tramadol 50mg  #90 which is 30 tablets short from the #120 that she gets every month per her medication contract agreement. She has a pruritic rash under her breasts, in her groin area, and in her inner thighs. The rash has been presents off and on for months but worsened in the last week to the point that she cannot sleep. She has had anxiety and panic attacks due to this rash and requests refill for Ativan.   Review of Systems  Constitutional: Negative for fever, chills, diaphoresis, activity change, appetite change, fatigue and unexpected weight change.  Respiratory: Negative for cough and wheezing.   Cardiovascular: Negative for chest pain, palpitations and leg swelling.  Genitourinary: Negative for dysuria and vaginal discharge.  Skin: Positive for rash.  Neurological: Negative for dizziness and weakness.  Psychiatric/Behavioral: Positive for sleep disturbance. The patient is nervous/anxious.        Objective:   Physical Exam  Nursing note and vitals reviewed. Constitutional: She appears well-nourished. No distress.  Cardiovascular: Normal rate.   Pulmonary/Chest: Effort normal and breath sounds normal.  Skin: She is not diaphoretic.  Intertrigo under bilateral breasts with no skin break no abscess, pt declined examination of inner thighs and groin area  Psychiatric: She has a normal mood and affect.  Mildly anxious           Assessment & Plan:

## 2014-02-03 NOTE — Assessment & Plan Note (Addendum)
She has had recurrent candidiasis. She does not have diabetes, HgA1C 5.5% (checked during this visit). This was likely exacerbated by recent use of ceftin and prednisone which had been prescribed to her for a COPD exacerbation .  -Rx Diflucan 150mg  once followed by 150mg  next week.  -Pt advised to avoid hot showers as this may provoke more pain/itching -Pt advised to wah undergarments in hot water with bleach

## 2014-02-03 NOTE — Telephone Encounter (Signed)
If her stomach is still bothering her, she may need to be seen in the clinic. I have filled a prescription for the other 30 pills of Tramadol. It was a prescribing mistake.

## 2014-02-03 NOTE — Assessment & Plan Note (Signed)
She has an appointment with her Psychiatrist on 4/10, pt to discuss Ativan refill and sleep aides during that visit.

## 2014-02-03 NOTE — Assessment & Plan Note (Signed)
Gave patient another Rx for tramadol 50mg  #30 to complete the #120 tablets she receives per month per her medication contract.

## 2014-02-04 ENCOUNTER — Ambulatory Visit (INDEPENDENT_AMBULATORY_CARE_PROVIDER_SITE_OTHER): Payer: Medicaid Other | Admitting: Psychiatry

## 2014-02-04 DIAGNOSIS — F323 Major depressive disorder, single episode, severe with psychotic features: Secondary | ICD-10-CM

## 2014-02-04 DIAGNOSIS — F332 Major depressive disorder, recurrent severe without psychotic features: Secondary | ICD-10-CM

## 2014-02-04 DIAGNOSIS — F419 Anxiety disorder, unspecified: Secondary | ICD-10-CM

## 2014-02-04 MED ORDER — LORAZEPAM 0.5 MG PO TABS: ORAL_TABLET | ORAL | Status: DC

## 2014-02-04 NOTE — Progress Notes (Signed)
I have discussed presenting complaint, problem list, and medications with resident physician Dr Bernadene Bell and I concur with management and plan. Murriel Hopper, MD, FACP  Hematology-Oncology/Internal Mediciene

## 2014-02-04 NOTE — Progress Notes (Signed)
[facility] MD Progress Note  02/04/2014 10:40 AM [name] [name]  MRN:  [id number] Subjective:  Seems a bit confused This patient has missed visits for the last 2 times. When her last visit we were very clear and there are no indications to take Seroquel and week ago he stated to her to discontinue it. She claims that it was restarted in am not seeing any documentation of that. Today she denies any hallucinations. She doesn't show any distinct changes in her sleep or her appetite. The patient is not physically well. She clearly has liver disease. Recently she got frightened at her own home because she thought somebody was breaking in the last few days has been staying with her mother. She plans to go back home. The patient appears a bit anxious. The patient is not suicidal. The patient has another court date related to her disability for her liver disease. The patient like to go back to work but she realizes that she is physically 2 Limited. This patient is a poor historian. Diagnosis:   DSM5: Schizophrenia Disorders:   Obsessive-Compulsive Disorders:   Trauma-Stressor Disorders:   Substance/Addictive Disorders:   Depressive Disorders:  Major Depressive Disorder - with Psychotic Features (296.24) Total Time spent with patient: 30 minutes  Axis I: Major Depression, Recurrent severe  ADL's:  Intact  Sleep: Good  Appetite:  Good  Suicidal Ideation:  no Homicidal Ideation:  none AEB (as evidenced by):  Psychiatric Specialty Exam: Physical Exam  ROS  There were no vitals taken for this visit.There is no weight on file to calculate BMI.  General Appearance: Casual  Eye Contact::  Fair  Speech:  Slow  Volume:  Normal  Mood:  Depressed  Affect:  Blunt  Thought Process:  Coherent  Orientation:  Full (Time, Place, and Person)  Thought Content:  WDL  Suicidal Thoughts:  No  Homicidal Thoughts:  No  Memory:  NA  Judgement:  Fair  Insight:  Shallow  Psychomotor Activity:  Decreased   Concentration:  Fair  Recall:  Poor  Fund of Knowledge:Fair  Language: Fair  Akathisia:  No  Handed:  Right  AIMS (if indicated):     Assets:  Desire for Improvement  Sleep:      Musculoskeletal: Strength & Muscle Tone:  Gait & Station:  Patient leans:   Current Medications: Current Outpatient Prescriptions  Medication Sig Dispense Refill  . albuterol (PROVENTIL HFA;VENTOLIN HFA) 108 (90 BASE) MCG/ACT inhaler Inhale 2 puffs into the lungs every 6 (six) hours as needed for wheezing.      Marland Kitchen albuterol (PROVENTIL) (2.5 MG/3ML) 0.083% nebulizer solution Take 2.5 mg by nebulization 2 (two) times daily.      . fluconazole (DIFLUCAN) 150 MG tablet Take 1 tablet (150 mg total) by mouth once. Today and once next week  2 tablet  0  . furosemide (LASIX) 20 MG tablet Take 20 mg by mouth 2 (two) times daily.      . Ipratropium-Albuterol (COMBIVENT) 20-100 MCG/ACT AERS respimat Inhale 1 puff into the lungs every 6 (six) hours as needed for wheezing.  1 Inhaler  11  . lactulose (CHRONULAC) 10 GM/15ML solution Take 45 mLs (30 g total) by mouth every other day.  240 mL  11  . LORazepam (ATIVAN) 0.5 MG tablet 1 tid  90 tablet  2  . methocarbamol (ROBAXIN) 500 MG tablet Take 500 mg by mouth 2 (two) times daily as needed for muscle spasms.      . pantoprazole (PROTONIX) 40  MG tablet Take 40 mg by mouth daily.      . propranolol (INDERAL) 20 MG tablet Take 1 tablet (20 mg total) by mouth daily.  30 tablet  11  . QUEtiapine (SEROQUEL) 25 MG tablet Take 1 tablet (25 mg total) by mouth 2 (two) times daily.  60 tablet  0  . traMADol (ULTRAM) 50 MG tablet Take 1 tablet (50 mg total) by mouth every 8 (eight) hours as needed for moderate pain.  30 tablet  0  . traMADol (ULTRAM) 50 MG tablet Take 1 tablet (50 mg total) by mouth daily.  30 tablet  0   No current facility-administered medications for this visit.    Lab Results:  Results for orders placed in visit on 02/02/14 (from the past 48 hour(s))   GLUCOSE, CAPILLARY     Status: Abnormal   Collection Time    02/02/14  4:29 PM      Result Value Ref Range   Glucose-Capillary 160 (*) 70 - 99 mg/dL  POCT GLYCOSYLATED HEMOGLOBIN (HGB A1C)     Status: None   Collection Time    02/02/14  4:39 PM      Result Value Ref Range   Hemoglobin A1C 5.5      Physical Findings: AIMS:  , ,  ,  ,    CIWA:    COWS:     Treatment Plan Summary: At this time once again we will clarify this patient should stop her Seroquel. For the time being we'll continue her Ativan 0.5 mg 3 times a day and give her some refills. The patient to return to see me in 7 weeks and then we'll reevaluate this patient and her need for Ativan or for her Seroquel. Today we went over the risks of taking Seroquel and that unless there were indications of person should not be on. I shared this with the patient the patient seemed ambivalent about changing the Seroquel. She is only one 25 mg.  Plan:  Medical Decision Making Problem Points:  Established problem, worsening (2) Data Points:  Review of medication regiment & side effects (2)  I certify that inpatient services furnished can reasonably be expected to improve the patient's condition.   Berneta Sages Marquay Kruse 02/04/2014, 10:40 AM

## 2014-02-23 ENCOUNTER — Other Ambulatory Visit: Payer: Self-pay | Admitting: *Deleted

## 2014-02-23 DIAGNOSIS — G8929 Other chronic pain: Secondary | ICD-10-CM

## 2014-02-23 DIAGNOSIS — M549 Dorsalgia, unspecified: Principal | ICD-10-CM

## 2014-02-23 NOTE — Telephone Encounter (Signed)
Pt usually gets # 120  Last refill was for # 30

## 2014-02-24 ENCOUNTER — Telehealth: Payer: Self-pay | Admitting: *Deleted

## 2014-02-24 NOTE — Telephone Encounter (Signed)
Pt calls and states she was not able to answer a call from [facility] and would like to know if it was triage nurse, it was not, no documentation noted, reviewed chart, unable to find anything, she is advised that if she has no message they may call back later

## 2014-02-25 ENCOUNTER — Other Ambulatory Visit: Payer: Self-pay | Admitting: *Deleted

## 2014-02-25 MED ORDER — TRAMADOL HCL 50 MG PO TABS: 50.0000 mg | ORAL_TABLET | Freq: Four times a day (QID) | ORAL | Status: DC | PRN

## 2014-02-25 NOTE — Telephone Encounter (Signed)
Error

## 2014-03-08 ENCOUNTER — Telehealth (HOSPITAL_COMMUNITY): Payer: Self-pay | Admitting: *Deleted

## 2014-03-08 NOTE — Telephone Encounter (Signed)
Contacted patient--Patient states feels very depressed.Difficult getting out of ned in the morning.Does not want to be around people.Having a hard time.Denies wanting to harm herself/others. Wants MD to know she stopped Seroquel like she was supposed to. Appt is 5/29,wants to come sooner. Transferred patient to front desk to see if earlier appt is available or to be placed on cancellation list. Also advised patient to get up as many mornings as possible and try to go outside front door if possible to attempt to alleviate some symptoms.

## 2014-03-23 ENCOUNTER — Ambulatory Visit: Payer: Self-pay | Admitting: Internal Medicine

## 2014-03-25 ENCOUNTER — Ambulatory Visit (INDEPENDENT_AMBULATORY_CARE_PROVIDER_SITE_OTHER): Payer: Medicaid Other | Admitting: Psychiatry

## 2014-03-25 DIAGNOSIS — F4322 Adjustment disorder with anxiety: Secondary | ICD-10-CM

## 2014-03-25 DIAGNOSIS — F4323 Adjustment disorder with mixed anxiety and depressed mood: Secondary | ICD-10-CM

## 2014-03-25 DIAGNOSIS — F419 Anxiety disorder, unspecified: Secondary | ICD-10-CM

## 2014-03-25 MED ORDER — HYDROXYZINE HCL 50 MG PO TABS: ORAL_TABLET | ORAL | Status: DC

## 2014-03-25 MED ORDER — LORAZEPAM 0.5 MG PO TABS: ORAL_TABLET | ORAL | Status: DC

## 2014-03-25 NOTE — Progress Notes (Signed)
[facility] MD Progress Note  03/25/2014 12:01 PM [name] [name]  MRN:  [id number] Subjective:  Patient doesn't feel well Today the patient shares that she's had a lot of problems with her right leg. It is swollen and blue. She's had it evaluated for a blood clot and was negative. She doesn't understand what is causing her problem. The patient does describe persistent daily depression for the last one month. She isn't aware is coming from. Her sleep is disturbed in that she's having recurrent falling asleep and staying asleep but has no daytime dysfunction. She does not take naps and doesn't feel sleepy. Her appetite is chronically low. She claims she's lost a little bit of weight. But she says her appetite has been an issue for years. The patient denies problems thinking concentrating. She is a relatively low energy level. Her self-esteem is reduced but she does not feel worthless. She is not suicidal. The patient enjoys the TV and playing games on the computer. The patient does not drink any alcohol. She has no psychotic symptoms. She is minimal evidence of anxiety. In the last number of months we have had difficulty time over the last months taking her off Seroquel. The patient has been without it for a week and really doesn't notice any difference. Interview her chart there is little clear evidence of why she was on this agent albeit a low dose at 25 mg. The patient is getting her teeth fixed and she says that'll make her feel much better. I suspect it might help her appetite. The patient is not in therapy. Diagnosis:   DSM5: Schizophrenia Disorders:   Obsessive-Compulsive Disorders:   Trauma-Stressor Disorders:   Substance/Addictive Disorders:   Depressive Disorders:   Total Time spent with patient: 30 minutes  Axis I: Adjustment Disorder with Anxiety  ADL's:  Intact  Sleep: Good  Appetite:  Good  Suicidal Ideation:  no Homicidal Ideation:  non AEB (as evidenced by):  Psychiatric  Specialty Exam: Physical Exam  ROS  There were no vitals taken for this visit.There is no weight on file to calculate BMI.  General Appearance: Casual  Eye Contact::  Good  Speech:  Clear and Coherent  Volume:  Normal  Mood:  Depressed  Affect:  Appropriate  Thought Process:  Coherent  Orientation:  Full (Time, Place, and Person)  Thought Content:  WDL  Suicidal Thoughts:  No  Homicidal Thoughts:  No  Memory:  NA  Judgement:  NA  Insight:  Good  Psychomotor Activity:  Normal  Concentration:  Good  Recall:  Good  Fund of Knowledge:Good  Language: Good  Akathisia:  No  Handed:  Right  AIMS (if indicated):     Assets:  Desire for Improvement  Sleep:      Musculoskeletal: Strength & Muscle Tone:  Gait & Station:  Patient leans:   Current Medications: Current Outpatient Prescriptions  Medication Sig Dispense Refill  . albuterol (PROVENTIL HFA;VENTOLIN HFA) 108 (90 BASE) MCG/ACT inhaler Inhale 2 puffs into the lungs every 6 (six) hours as needed for wheezing.      Marland Kitchen albuterol (PROVENTIL) (2.5 MG/3ML) 0.083% nebulizer solution Take 2.5 mg by nebulization 2 (two) times daily.      . fluconazole (DIFLUCAN) 150 MG tablet Take 1 tablet (150 mg total) by mouth once. Today and once next week  2 tablet  0  . furosemide (LASIX) 20 MG tablet Take 20 mg by mouth 2 (two) times daily.      . hydrOXYzine (ATARAX/VISTARIL) 50  MG tablet 1 qhs may repeat  60 tablet  2  . Ipratropium-Albuterol (COMBIVENT) 20-100 MCG/ACT AERS respimat Inhale 1 puff into the lungs every 6 (six) hours as needed for wheezing.  1 Inhaler  11  . lactulose (CHRONULAC) 10 GM/15ML solution Take 45 mLs (30 g total) by mouth every other day.  240 mL  11  . LORazepam (ATIVAN) 0.5 MG tablet 1 qam   1  qnoon  60 tablet  2  . methocarbamol (ROBAXIN) 500 MG tablet Take 500 mg by mouth 2 (two) times daily as needed for muscle spasms.      . pantoprazole (PROTONIX) 40 MG tablet Take 40 mg by mouth daily.      . propranolol  (INDERAL) 20 MG tablet Take 1 tablet (20 mg total) by mouth daily.  30 tablet  11  . [START ON 03/28/2014] traMADol (ULTRAM) 50 MG tablet Take 1 tablet (50 mg total) by mouth every 6 (six) hours as needed for moderate pain or severe pain.  120 tablet  0   No current facility-administered medications for this visit.    Lab Results: No results found for this or any previous visit (from the past 48 hour(s)).  Physical Findings: AIMS:  , ,  ,  ,    CIWA:    COWS:     Treatment Plan Summary: At this time we will officially discontinue her Seroquel. The patient takes Ativan on an erratic basis. At this time we'll get her to take it on a fixed dose 0.5 mg one in the morning and one at noon. At night she she'll begin on Vistaril 25 mg every night and if she doesn't sleep her feel more comfortable she may increase it to 2 at night. The patient says she has significant liver disease. We'll make an effort to get her last liver profile for this time I feel low-dose Ativan is appropriate. This patient is diagnosis is still elusive. She'll return to see me in 9 weeks we'll we will reevaluate her diagnosis.  Plan:  Medical Decision Making Problem Points:  Established problem, stable/improving (1) Data Points:  Review of medication regiment & side effects (2)  I certify that inpatient services furnished can reasonably be expected to improve the patient's condition.   Berneta Sages Serra Younan 03/25/2014, 12:01 PM

## 2014-03-28 ENCOUNTER — Ambulatory Visit: Payer: Self-pay | Admitting: Internal Medicine

## 2014-03-28 ENCOUNTER — Telehealth: Payer: Self-pay | Admitting: *Deleted

## 2014-03-28 NOTE — Telephone Encounter (Signed)
Pt called - referral doctors will not see pt till Medicaid card is changed from [facility] to [facility]. Pt aware will will be glad to see in clinic if any problems. Pt was informed to call [phone] about changing PCP. Hilda Blades Hadi Dubin RN 03/28/14 2:15PM

## 2014-04-02 ENCOUNTER — Encounter (HOSPITAL_COMMUNITY): Payer: Self-pay | Admitting: Emergency Medicine

## 2014-04-02 ENCOUNTER — Emergency Department (HOSPITAL_COMMUNITY): Payer: Medicaid Other

## 2014-04-02 ENCOUNTER — Emergency Department (HOSPITAL_COMMUNITY)
Admission: EM | Admit: 2014-04-02 | Discharge: 2014-04-02 | Disposition: A | Discharge status: Home / Self Care | Payer: Medicaid Other | Attending: Emergency Medicine | Admitting: Emergency Medicine

## 2014-04-02 DIAGNOSIS — E669 Obesity, unspecified: Secondary | ICD-10-CM | POA: Insufficient documentation

## 2014-04-02 DIAGNOSIS — Z8719 Personal history of other diseases of the digestive system: Secondary | ICD-10-CM | POA: Insufficient documentation

## 2014-04-02 DIAGNOSIS — G8929 Other chronic pain: Secondary | ICD-10-CM | POA: Insufficient documentation

## 2014-04-02 DIAGNOSIS — F329 Major depressive disorder, single episode, unspecified: Secondary | ICD-10-CM | POA: Insufficient documentation

## 2014-04-02 DIAGNOSIS — M25579 Pain in unspecified ankle and joints of unspecified foot: Secondary | ICD-10-CM | POA: Insufficient documentation

## 2014-04-02 DIAGNOSIS — J449 Chronic obstructive pulmonary disease, unspecified: Secondary | ICD-10-CM | POA: Insufficient documentation

## 2014-04-02 DIAGNOSIS — Z8541 Personal history of malignant neoplasm of cervix uteri: Secondary | ICD-10-CM | POA: Insufficient documentation

## 2014-04-02 DIAGNOSIS — J4489 Other specified chronic obstructive pulmonary disease: Secondary | ICD-10-CM | POA: Insufficient documentation

## 2014-04-02 DIAGNOSIS — B351 Tinea unguium: Secondary | ICD-10-CM | POA: Insufficient documentation

## 2014-04-02 DIAGNOSIS — F3289 Other specified depressive episodes: Secondary | ICD-10-CM | POA: Insufficient documentation

## 2014-04-02 DIAGNOSIS — F411 Generalized anxiety disorder: Secondary | ICD-10-CM | POA: Insufficient documentation

## 2014-04-02 DIAGNOSIS — Z8619 Personal history of other infectious and parasitic diseases: Secondary | ICD-10-CM | POA: Insufficient documentation

## 2014-04-02 DIAGNOSIS — Z87891 Personal history of nicotine dependence: Secondary | ICD-10-CM | POA: Insufficient documentation

## 2014-04-02 DIAGNOSIS — Z8669 Personal history of other diseases of the nervous system and sense organs: Secondary | ICD-10-CM | POA: Insufficient documentation

## 2014-04-02 DIAGNOSIS — Z79899 Other long term (current) drug therapy: Secondary | ICD-10-CM | POA: Insufficient documentation

## 2014-04-02 MED ORDER — HYDROCODONE-IBUPROFEN 7.5-200 MG PO TABS: 1.0000 | ORAL_TABLET | Freq: Four times a day (QID) | ORAL | Status: DC | PRN

## 2014-04-02 NOTE — ED Notes (Signed)
Pt has appt 04-20-14 with Dr. Eulas Post, internal med, who will then give referral to foot doctor.  Onset 1 year ago pt has sprain to right foot.  Has not been seen by foot doctor.  Pt has had increased pain in the past several weeks.

## 2014-04-02 NOTE — ED Notes (Signed)
Discharge nurse did not take pt out of system.  Discharged pt out of system now.

## 2014-04-02 NOTE — ED Provider Notes (Signed)
CSN: [id number]     Arrival date & time 04/02/14  1434 History  This chart was scribed for non-physician practitioner Margarita Mail, PA-C working with Leota Jacobsen, MD by Mercy Moore, ED Scribe. This patient was seen in room TR07C/TR07C and the patient's care was started at 5:12 PM.   Chief Complaint  Patient presents with  . Foot Pain      The history is provided by the patient. No language interpreter was used.   HPI Comments: [name] [name] is a 53 y.o. female who presents to the Emergency Department complaining of persistent right foot pain ongoing for an extended period of time. Patient's foot has been evaluated several times. Imaging performed: no significant findings. Patient has been referred to specialist but her appointment is not until 6/24. She reports intermittent swelling from her toes to her ankle. Patient reports Treatment with Tramadol 50 mg, no relief. Initial injury apparently occurred years ago.   Patient reports laying on the couch, moving her foot and hearing a pop. Patient reports pain and swelling since.   Patient is unable to take Tylenol. Reports relief with vicoprofen.     Past Medical History  Diagnosis Date  . Alcoholic cirrhosis   . COPD (chronic obstructive pulmonary disease)   . Heart palpitations   . Anxiety   . Portal hypertension   . Esophageal varices   . Cholelithiasis   . Splenomegaly   . Fatty liver   . Esophagitis 2010  . Tubulovillous adenoma of colon   . Visual hallucination     since 09/2010/notes 10/30/2012  . Hepatitis C     chronic hepatitis C and Steatohepatitis (hep grade 2, stage 2-3) per 05/13/08 liver biopsy  . Obesity   . cervical Cancer 1999  . Depression   . Shortness of breath    Past Surgical History  Procedure Laterality Date  . Cervical cancer surgery    . Abdominal hysterectomy    . Tubal ligation    . Esophagogastroduodenoscopy  10/18/2011    Procedure: ESOPHAGOGASTRODUODENOSCOPY (EGD);  Surgeon:  Owens Loffler, MD;  Location: [facility];  Service: Endoscopy;  Laterality: N/A;  . Hysterectomy     Family History  Problem Relation Age of Onset  . Cervical cancer Sister   . Breast cancer Maternal Aunt   . Heart disease Father 15    died from MI  . Breast cancer Father   . Cervical cancer Daughter    History  Substance Use Topics  . Smoking status: Former Smoker -- 0.10 packs/day for 40 years    Types: Cigarettes    Quit date: 10/28/2010  . Smokeless tobacco: Never Used  . Alcohol Use: Yes     Comment: months since last drink   OB History   Grav Para Term Preterm Abortions TAB SAB Ect Mult Living   3 2   1  1   2      Review of Systems  Constitutional: Negative for fever.  Musculoskeletal: Positive for arthralgias.      Allergies  Amitriptyline  Home Medications   Prior to Admission medications   Medication Sig Start Date End Date Taking? Authorizing Provider  albuterol (PROVENTIL HFA;VENTOLIN HFA) 108 (90 BASE) MCG/ACT inhaler Inhale 2 puffs into the lungs every 6 (six) hours as needed for wheezing. 05/08/13   Neema Bobbie Stack, MD  albuterol (PROVENTIL) (2.5 MG/3ML) 0.083% nebulizer solution Take 2.5 mg by nebulization 2 (two) times daily. 06/03/13   Blain Pais, MD  fluconazole (DIFLUCAN)  150 MG tablet Take 1 tablet (150 mg total) by mouth once. Today and once next week 02/02/14   Blain Pais, MD  furosemide (LASIX) 20 MG tablet Take 20 mg by mouth 2 (two) times daily.    Historical Provider, MD  hydrOXYzine (ATARAX/VISTARIL) 50 MG tablet 1 qhs may repeat 03/25/14   Norma Fredrickson, MD  Ipratropium-Albuterol (COMBIVENT) 20-100 MCG/ACT AERS respimat Inhale 1 puff into the lungs every 6 (six) hours as needed for wheezing. 04/27/13   Cresenciano Genre, MD  lactulose (CHRONULAC) 10 GM/15ML solution Take 45 mLs (30 g total) by mouth every other day. 01/27/14   Otho Bellows, MD  LORazepam (ATIVAN) 0.5 MG tablet 1 qam   1  qnoon 03/25/14   Norma Fredrickson, MD   methocarbamol (ROBAXIN) 500 MG tablet Take 500 mg by mouth 2 (two) times daily as needed for muscle spasms. 11/29/13   Charlett Blake, MD  pantoprazole (PROTONIX) 40 MG tablet Take 40 mg by mouth daily.  12/28/14  Otho Bellows, MD  propranolol (INDERAL) 20 MG tablet Take 1 tablet (20 mg total) by mouth daily. 01/27/14   Otho Bellows, MD  traMADol (ULTRAM) 50 MG tablet Take 1 tablet (50 mg total) by mouth every 6 (six) hours as needed for moderate pain or severe pain. 03/28/14   Otho Bellows, MD   Triage Vitals: BP 128/81  Pulse 72  Temp(Src) 98 F (36.7 C) (Oral)  Resp 20  SpO2 98% Physical Exam  Nursing note and vitals reviewed. Constitutional: She is oriented to person, place, and time. She appears well-developed and well-nourished. No distress.  HENT:  Head: Normocephalic and atraumatic.  Eyes: EOM are normal.  Neck: Neck supple. No tracheal deviation present.  Cardiovascular: Normal rate.   Pulmonary/Chest: Effort normal. No respiratory distress.  Musculoskeletal: Normal range of motion.  Right foot: Minimal swelling. Swelling in right ankle. Darker in color. Palpable pulses on left foot. Good dorsalis pedis and posterior tibialsis pulses are also palpable in right foot. Onychomycosis and macerated tinea pedis.    Neurological: She is alert and oriented to person, place, and time.  Skin: Skin is warm and dry.  Psychiatric: She has a normal mood and affect. Her behavior is normal.    ED Course  Procedures (including critical care time) DIAGNOSTIC STUDIES: Oxygen Saturation is 98% on room air, normal by my interpretation.    COORDINATION OF CARE: 5:22 PM- Discussed treatment plan with patient at bedside and patient agreed to plan.     Labs Review Labs Reviewed - No data to display  Imaging Review No results found.   EKG Interpretation None      MDM   Final diagnoses:  Ankle pain, chronic   Patient X-Ray negative for obvious fracture or dislocation. Pain  managed in ED. Pt advised to follow up with orthopedics if symptoms persist for possibility of missed fracture diagnosis. Patient given rx for home walker. conservative therapy recommended and discussed. Patient will be dc home & is agreeable with above plan.   I personally performed the services described in this documentation, which was scribed in my presence. The recorded information has been reviewed and is accurate.    Margarita Mail, PA-C 04/07/14 2149

## 2014-04-02 NOTE — ED Notes (Addendum)
Reports having pain to right foot for extended amount of time. Denies injury to foot. Has been seen for the pain and had xrays done but they were normal, has been referred to specialist but doesn't have appt till 6/26. Reports intermittent swelling from toes to ankle. Report tramadol pta.

## 2014-04-02 NOTE — ED Notes (Signed)
PT D/C with RX for walker.

## 2014-04-02 NOTE — Discharge Instructions (Signed)
Your xray showed only ankle swelling. There are no breaks or dislocations. You will need to follow up with your primary care doctor regarding your ankle.  Ankle Pain Ankle pain is a common symptom. The bones, cartilage, tendons, and muscles of the ankle joint perform a lot of work each day. The ankle joint holds your body weight and allows you to move around. Ankle pain can occur on either side or back of 1 or both ankles. Ankle pain may be sharp and burning or dull and aching. There may be tenderness, stiffness, redness, or warmth around the ankle. The pain occurs more often when a person walks or puts pressure on the ankle. CAUSES  There are many reasons ankle pain can develop. It is important to work with your caregiver to identify the cause since many conditions can impact the bones, cartilage, muscles, and tendons. Causes for ankle pain include:  Injury, including a break (fracture), sprain, or strain often due to a fall, sports, or a high-impact activity.  Swelling (inflammation) of a tendon (tendonitis).  Achilles tendon rupture.  Ankle instability after repeated sprains and strains.  Poor foot alignment.  Pressure on a nerve (tarsal tunnel syndrome).  Arthritis in the ankle or the lining of the ankle.  Crystal formation in the ankle (gout or pseudogout). DIAGNOSIS  A diagnosis is based on your medical history, your symptoms, results of your physical exam, and results of diagnostic tests. Diagnostic tests may include X-ray exams or a computerized magnetic scan (magnetic resonance imaging, MRI). TREATMENT  Treatment will depend on the cause of your ankle pain and may include:  Keeping pressure off the ankle and limiting activities.  Using crutches or other walking support (a cane or brace).  Using rest, ice, compression, and elevation.  Participating in physical therapy or home exercises.  Wearing shoe inserts or special shoes.  Losing weight.  Taking medications to  reduce pain or swelling or receiving an injection.  Undergoing surgery. HOME CARE INSTRUCTIONS   Only take over-the-counter or prescription medicines for pain, discomfort, or fever as directed by your caregiver.  Put ice on the injured area.  Put ice in a plastic bag.  Place a towel between your skin and the bag.  Leave the ice on for 15-20 minutes at a time, 03-04 times a day.  Keep your leg raised (elevated) when possible to lessen swelling.  Avoid activities that cause ankle pain.  Follow specific exercises as directed by your caregiver.  Record how often you have ankle pain, the location of the pain, and what it feels like. This information may be helpful to you and your caregiver.  Ask your caregiver about returning to work or sports and whether you should drive.  Follow up with your caregiver for further examination, therapy, or testing as directed. SEEK MEDICAL CARE IF:   Pain or swelling continues or worsens beyond 1 week.  You have an oral temperature above 102 F (38.9 C).  You are feeling unwell or have chills.  You are having an increasingly difficult time with walking.  You have loss of sensation or other new symptoms.  You have questions or concerns. MAKE SURE YOU:   Understand these instructions.  Will watch your condition.  Will get help right away if you are not doing well or get worse. Document Released: 04/03/2010 Document Revised: 01/06/2012 Document Reviewed: 04/03/2010 [facility] Patient Information 2014 [place].

## 2014-04-02 NOTE — ED Notes (Signed)
Error in charting Pt not D/C

## 2014-04-03 DIAGNOSIS — Z79899 Other long term (current) drug therapy: Secondary | ICD-10-CM | POA: Insufficient documentation

## 2014-04-03 DIAGNOSIS — M25579 Pain in unspecified ankle and joints of unspecified foot: Secondary | ICD-10-CM | POA: Insufficient documentation

## 2014-04-03 DIAGNOSIS — Z860100 Personal history of colon polyps, unspecified: Secondary | ICD-10-CM | POA: Insufficient documentation

## 2014-04-03 DIAGNOSIS — G8929 Other chronic pain: Secondary | ICD-10-CM | POA: Insufficient documentation

## 2014-04-03 DIAGNOSIS — E669 Obesity, unspecified: Secondary | ICD-10-CM | POA: Insufficient documentation

## 2014-04-03 DIAGNOSIS — Z90710 Acquired absence of both cervix and uterus: Secondary | ICD-10-CM | POA: Insufficient documentation

## 2014-04-03 DIAGNOSIS — Z8541 Personal history of malignant neoplasm of cervix uteri: Secondary | ICD-10-CM | POA: Insufficient documentation

## 2014-04-03 DIAGNOSIS — F411 Generalized anxiety disorder: Secondary | ICD-10-CM | POA: Insufficient documentation

## 2014-04-03 DIAGNOSIS — F3289 Other specified depressive episodes: Secondary | ICD-10-CM | POA: Insufficient documentation

## 2014-04-03 DIAGNOSIS — J4489 Other specified chronic obstructive pulmonary disease: Secondary | ICD-10-CM | POA: Insufficient documentation

## 2014-04-03 DIAGNOSIS — K7460 Unspecified cirrhosis of liver: Secondary | ICD-10-CM | POA: Insufficient documentation

## 2014-04-03 DIAGNOSIS — J449 Chronic obstructive pulmonary disease, unspecified: Secondary | ICD-10-CM | POA: Insufficient documentation

## 2014-04-03 DIAGNOSIS — Z87891 Personal history of nicotine dependence: Secondary | ICD-10-CM | POA: Insufficient documentation

## 2014-04-03 DIAGNOSIS — F329 Major depressive disorder, single episode, unspecified: Secondary | ICD-10-CM | POA: Insufficient documentation

## 2014-04-03 DIAGNOSIS — Z9851 Tubal ligation status: Secondary | ICD-10-CM | POA: Insufficient documentation

## 2014-04-03 DIAGNOSIS — Z8619 Personal history of other infectious and parasitic diseases: Secondary | ICD-10-CM | POA: Insufficient documentation

## 2014-04-04 ENCOUNTER — Emergency Department (HOSPITAL_COMMUNITY)
Admission: EM | Admit: 2014-04-04 | Discharge: 2014-04-04 | Disposition: A | Discharge status: Home / Self Care | Payer: Medicaid Other | Attending: Emergency Medicine | Admitting: Emergency Medicine

## 2014-04-04 ENCOUNTER — Encounter (HOSPITAL_COMMUNITY): Payer: Self-pay | Admitting: Emergency Medicine

## 2014-04-04 DIAGNOSIS — K7460 Unspecified cirrhosis of liver: Secondary | ICD-10-CM

## 2014-04-04 DIAGNOSIS — M25571 Pain in right ankle and joints of right foot: Secondary | ICD-10-CM

## 2014-04-04 LAB — CBC WITH DIFFERENTIAL/PLATELET
Basophils Absolute: 0 10*3/uL (ref 0.0–0.1)
Basophils Relative: 0 % (ref 0–1)
EOS ABS: 0.3 10*3/uL (ref 0.0–0.7)
EOS PCT: 4 % (ref 0–5)
HCT: 44.9 % (ref 36.0–46.0)
Hemoglobin: 15.4 g/dL — ABNORMAL HIGH (ref 12.0–15.0)
LYMPHS ABS: 2.4 10*3/uL (ref 0.7–4.0)
LYMPHS PCT: 32 % (ref 12–46)
MCH: 34.8 pg — AB (ref 26.0–34.0)
MCHC: 34.3 g/dL (ref 30.0–36.0)
MCV: 101.6 fL — ABNORMAL HIGH (ref 78.0–100.0)
Monocytes Absolute: 0.8 10*3/uL (ref 0.1–1.0)
Monocytes Relative: 11 % (ref 3–12)
NEUTROS PCT: 53 % (ref 43–77)
Neutro Abs: 4 10*3/uL (ref 1.7–7.7)
Platelets: 125 10*3/uL — ABNORMAL LOW (ref 150–400)
RBC: 4.42 MIL/uL (ref 3.87–5.11)
RDW: 14.3 % (ref 11.5–15.5)
WBC: 7.5 10*3/uL (ref 4.0–10.5)

## 2014-04-04 MED ORDER — OXYCODONE HCL 5 MG PO TABS
10.0000 mg | ORAL_TABLET | Freq: Once | ORAL | Status: AC
  Administered 2014-04-04: 10 mg via ORAL
  Filled 2014-04-04: qty 2

## 2014-04-04 NOTE — ED Provider Notes (Signed)
CSN: [id number]     Arrival date & time 04/03/14  2348 History   First MD Initiated Contact with Patient 04/04/14 [phone]     Chief Complaint  Patient presents with  . Ankle Pain  . Abdominal Pain     (Consider location/radiation/quality/duration/timing/severity/associated sxs/prior Treatment) HPI This is a middle-aged woman with alcoholic liver cirrhosis and chronic right ankle pain. She presents with complaints of right ankle pain. She was seen here for the same thing 2 days ago. She has had ankle pain for over 12 months. She denies any trauma. She's been worked up several times in the emergency department for the same complaint. Most recently, on June 6, she had plain films of the right ankle which were negative for fracture or dislocation. About 4 weeks ago, she had a duplex venous ultrasound of the right leg which was negative for DVT.  The patient says she's been taking Vicoprofen. But, she says it is not doing enough to relieve her pain. She wants to know why she is having pain. She has a followup appointment with her primary care provider at [facility] internal medicine on June 24.  The pain as aching, mild to moderate, worse with weightbearing. He has chronic bilateral lower extremity edema which chronically worse on the right than the left. No paresthesias or motor weakness.   Past Medical History  Diagnosis Date  . Alcoholic cirrhosis   . COPD (chronic obstructive pulmonary disease)   . Heart palpitations   . Anxiety   . Portal hypertension   . Esophageal varices   . Cholelithiasis   . Splenomegaly   . Fatty liver   . Esophagitis 2010  . Tubulovillous adenoma of colon   . Visual hallucination     since 09/2010/notes 10/30/2012  . Hepatitis C     chronic hepatitis C and Steatohepatitis (hep grade 2, stage 2-3) per 05/13/08 liver biopsy  . Obesity   . cervical Cancer 1999  . Depression   . Shortness of breath    Past Surgical History  Procedure Laterality Date  . Cervical  cancer surgery    . Abdominal hysterectomy    . Tubal ligation    . Esophagogastroduodenoscopy  10/18/2011    Procedure: ESOPHAGOGASTRODUODENOSCOPY (EGD);  Surgeon: Owens Loffler, MD;  Location: [facility];  Service: Endoscopy;  Laterality: N/A;  . Hysterectomy     Family History  Problem Relation Age of Onset  . Cervical cancer Sister   . Breast cancer Maternal Aunt   . Heart disease Father 54    died from MI  . Breast cancer Father   . Cervical cancer Daughter    History  Substance Use Topics  . Smoking status: Former Smoker -- 0.10 packs/day for 40 years    Types: Cigarettes    Quit date: 10/28/2010  . Smokeless tobacco: Never Used  . Alcohol Use: Yes     Comment: months since last drink   OB History   Grav Para Term Preterm Abortions TAB SAB Ect Mult Living   3 2   1  1   2      Review of Systems Ten point review of symptoms performed and is negative with the exception of symptoms noted above.     Allergies  Amitriptyline  Home Medications   Prior to Admission medications   Medication Sig Start Date End Date Taking? Authorizing Provider  albuterol (PROVENTIL HFA;VENTOLIN HFA) 108 (90 BASE) MCG/ACT inhaler Inhale 2 puffs into the lungs every 6 (six) hours  as needed for wheezing. 05/08/13   Neema Bobbie Stack, MD  albuterol (PROVENTIL) (2.5 MG/3ML) 0.083% nebulizer solution Take 2.5 mg by nebulization 2 (two) times daily. 06/03/13   Blain Pais, MD  fluconazole (DIFLUCAN) 150 MG tablet Take 1 tablet (150 mg total) by mouth once. Today and once next week 02/02/14   Blain Pais, MD  furosemide (LASIX) 20 MG tablet Take 20 mg by mouth 2 (two) times daily.    Historical Provider, MD  HYDROcodone-ibuprofen (VICOPROFEN) 7.5-200 MG per tablet Take 1 tablet by mouth every 6 (six) hours as needed for moderate pain. 04/02/14   Margarita Mail, PA-C  hydrOXYzine (ATARAX/VISTARIL) 50 MG tablet 1 qhs may repeat 03/25/14   Norma Fredrickson, MD  Ipratropium-Albuterol (COMBIVENT)  20-100 MCG/ACT AERS respimat Inhale 1 puff into the lungs every 6 (six) hours as needed for wheezing. 04/27/13   Cresenciano Genre, MD  lactulose (CHRONULAC) 10 GM/15ML solution Take 45 mLs (30 g total) by mouth every other day. 01/27/14   Otho Bellows, MD  LORazepam (ATIVAN) 0.5 MG tablet 1 qam   1  qnoon 03/25/14   Norma Fredrickson, MD  methocarbamol (ROBAXIN) 500 MG tablet Take 500 mg by mouth 2 (two) times daily as needed for muscle spasms. 11/29/13   Charlett Blake, MD  pantoprazole (PROTONIX) 40 MG tablet Take 40 mg by mouth daily.  12/28/14  Otho Bellows, MD  propranolol (INDERAL) 20 MG tablet Take 1 tablet (20 mg total) by mouth daily. 01/27/14   Otho Bellows, MD  traMADol (ULTRAM) 50 MG tablet Take 1 tablet (50 mg total) by mouth every 6 (six) hours as needed for moderate pain or severe pain. 03/28/14   Otho Bellows, MD   BP 126/80  Pulse 65  Temp(Src) 98.3 F (36.8 C) (Oral)  Resp 20  SpO2 92% Physical Exam Gen: well developed and well nourished appearing Head: NCAT Eyes: PERL, EOMI Nose: no epistaixis or rhinorrhea Mouth/throat: mucosa is moist and pink Neck: supple, no stridor Lungs: CTA B, no wheezing, rhonchi or rales CV: RRR, no murmur, extremities appear well perfused.  Abd: soft, notender, nondistended Back: no ttp, no cva ttp Skin: warm and dry Ext: normal to inspection, mild pitting bilateral LE edema - right > left, mild left ankle effusion v. STS, mild ttp over the medial and lateral ankles, DP pulses strong and symmetric, normal plantar and dorsiflexion.  Neuro: CN ii-xii grossly intact, no focal deficits Psyche; normal affect,  calm and cooperative.   ED Course  Procedures (including critical care time) Labs Review  Imaging Review Dg Ankle Complete Right  04/02/2014   CLINICAL DATA:  Pain and swelling  EXAM: RIGHT ANKLE - COMPLETE 3+ VIEW  COMPARISON:  October 07, 2013  FINDINGS: Frontal, oblique, and lateral views were obtained. There is generalized soft  tissue swelling. There is no fracture or effusion. Ankle mortise appears intact. There is a stable small exostosis arising from medial talus.  IMPRESSION: Generalized soft tissue swelling. No fracture. Mortise intact. There is a small exostosis arising from the medial talus, stable.   Electronically Signed   By: Lowella Grip M.D.   On: 04/02/2014 18:11    MDM   Chronic right ankle pain. Tx with oxycodone 10mg  in ED. No signs of septic arthritis. DVT has been recently excluded. Stable for d/c with plan to f/u with PCP and with referral to Ortho for outpatient f/u.    Elyn Peers, MD 04/04/14 [phone]

## 2014-04-04 NOTE — Discharge Instructions (Signed)
Arthralgia  Your caregiver has diagnosed you as suffering from an arthralgia. Arthralgia means there is pain in a joint. This can come from many reasons including:  · Bruising the joint which causes soreness (inflammation) in the joint.  · Wear and tear on the joints which occur as we grow older (osteoarthritis).  · Overusing the joint.  · Various forms of arthritis.  · Infections of the joint.  Regardless of the cause of pain in your joint, most of these different pains respond to anti-inflammatory drugs and rest. The exception to this is when a joint is infected, and these cases are treated with antibiotics, if it is a bacterial infection.  HOME CARE INSTRUCTIONS   · Rest the injured area for as long as directed by your caregiver. Then slowly start using the joint as directed by your caregiver and as the pain allows. Crutches as directed may be useful if the ankles, knees or hips are involved. If the knee was splinted or casted, continue use and care as directed. If an stretchy or elastic wrapping bandage has been applied today, it should be removed and re-applied every 3 to 4 hours. It should not be applied tightly, but firmly enough to keep swelling down. Watch toes and feet for swelling, bluish discoloration, coldness, numbness or excessive pain. If any of these problems (symptoms) occur, remove the ace bandage and re-apply more loosely. If these symptoms persist, contact your caregiver or return to this location.  · For the first 24 hours, keep the injured extremity elevated on pillows while lying down.  · Apply ice for 15-20 minutes to the sore joint every couple hours while awake for the first half day. Then 03-04 times per day for the first 48 hours. Put the ice in a plastic bag and place a towel between the bag of ice and your skin.  · Wear any splinting, casting, elastic bandage applications, or slings as instructed.  · Only take over-the-counter or prescription medicines for pain, discomfort, or fever as  directed by your caregiver. Do not use aspirin immediately after the injury unless instructed by your physician. Aspirin can cause increased bleeding and bruising of the tissues.  · If you were given crutches, continue to use them as instructed and do not resume weight bearing on the sore joint until instructed.  Persistent pain and inability to use the sore joint as directed for more than 2 to 3 days are warning signs indicating that you should see a caregiver for a follow-up visit as soon as possible. Initially, a hairline fracture (break in bone) may not be evident on X-rays. Persistent pain and swelling indicate that further evaluation, non-weight bearing or use of the joint (use of crutches or slings as instructed), or further X-rays are indicated. X-rays may sometimes not show a small fracture until a week or 10 days later. Make a follow-up appointment with your own caregiver or one to whom we have referred you. A radiologist (specialist in reading X-rays) may read your X-rays. Make sure you know how you are to obtain your X-ray results. Do not assume everything is normal if you do not hear from us.  SEEK MEDICAL CARE IF:  Bruising, swelling, or pain increases.  SEEK IMMEDIATE MEDICAL CARE IF:   · Your fingers or toes are numb or blue.  · The pain is not responding to medications and continues to stay the same or get worse.  · The pain in your joint becomes severe.  · You   develop a fever over 102° F (38.9° C).  · It becomes impossible to move or use the joint.  MAKE SURE YOU:   · Understand these instructions.  · Will watch your condition.  · Will get help right away if you are not doing well or get worse.  Document Released: 10/14/2005 Document Revised: 01/06/2012 Document Reviewed: 06/01/2008  [employer]® Patient Information ©2014 [employer].

## 2014-04-04 NOTE — ED Notes (Signed)
Per pt report: pt c/o of ankle pain and "liver pain." Pt has had right ankle pain for about a year.  Pt reports that the pain is so bad today that she cannot bear weight on it. Pt also reports having pain in her liver. Pt reports a hx of cirrhosis of the liver. Pt denies any injury to ankle but left ankle is swollen

## 2014-04-06 ENCOUNTER — Encounter: Payer: Self-pay | Admitting: Internal Medicine

## 2014-04-06 ENCOUNTER — Ambulatory Visit: Payer: Self-pay | Admitting: Internal Medicine

## 2014-04-08 NOTE — ED Provider Notes (Signed)
Medical screening examination/treatment/procedure(s) were performed by non-physician practitioner and as supervising physician I was immediately available for consultation/collaboration.  Leota Jacobsen, MD 04/08/14 [phone]

## 2014-04-14 ENCOUNTER — Other Ambulatory Visit: Payer: Self-pay | Admitting: Internal Medicine

## 2014-04-14 DIAGNOSIS — Z1231 Encounter for screening mammogram for malignant neoplasm of breast: Secondary | ICD-10-CM

## 2014-04-15 ENCOUNTER — Telehealth (HOSPITAL_COMMUNITY): Payer: Self-pay

## 2014-04-15 ENCOUNTER — Telehealth: Payer: Self-pay | Admitting: Internal Medicine

## 2014-04-15 DIAGNOSIS — K2100 Gastro-esophageal reflux disease with esophagitis, without bleeding: Secondary | ICD-10-CM

## 2014-04-15 MED ORDER — RANITIDINE HCL 150 MG PO CAPS: 150.0000 mg | ORAL_CAPSULE | Freq: Two times a day (BID) | ORAL | Status: DC

## 2014-04-15 NOTE — Telephone Encounter (Signed)
Got a page on the on-call pager. Ms. Ruppel said that her GERD was acting up again and she wanted a prescription for Zantac. She states that she took 2 Zantac tablets from her mother and they made her acid reflux feel better.   I sent in Rx for Zantac 150mg  BID #60, 2 refills to her Pharmacy at [facility].

## 2014-04-18 ENCOUNTER — Other Ambulatory Visit: Payer: Self-pay | Admitting: Internal Medicine

## 2014-04-20 ENCOUNTER — Other Ambulatory Visit: Payer: Self-pay | Admitting: *Deleted

## 2014-04-20 ENCOUNTER — Ambulatory Visit: Payer: Self-pay | Admitting: Internal Medicine

## 2014-04-20 DIAGNOSIS — G8929 Other chronic pain: Secondary | ICD-10-CM

## 2014-04-20 DIAGNOSIS — M549 Dorsalgia, unspecified: Principal | ICD-10-CM

## 2014-04-22 ENCOUNTER — Ambulatory Visit (INDEPENDENT_AMBULATORY_CARE_PROVIDER_SITE_OTHER): Payer: Medicaid Other | Admitting: Internal Medicine

## 2014-04-22 ENCOUNTER — Encounter: Payer: Self-pay | Admitting: Internal Medicine

## 2014-04-22 ENCOUNTER — Ambulatory Visit (HOSPITAL_COMMUNITY): Payer: Medicaid Other

## 2014-04-22 VITALS — BP 107/71 | HR 70 | Temp 97.2°F | Wt 182.0 lb

## 2014-04-22 DIAGNOSIS — J449 Chronic obstructive pulmonary disease, unspecified: Secondary | ICD-10-CM

## 2014-04-22 DIAGNOSIS — M79671 Pain in right foot: Secondary | ICD-10-CM

## 2014-04-22 DIAGNOSIS — F419 Anxiety disorder, unspecified: Secondary | ICD-10-CM

## 2014-04-22 DIAGNOSIS — M25579 Pain in unspecified ankle and joints of unspecified foot: Secondary | ICD-10-CM

## 2014-04-22 DIAGNOSIS — F411 Generalized anxiety disorder: Secondary | ICD-10-CM

## 2014-04-22 DIAGNOSIS — Z0000 Encounter for general adult medical examination without abnormal findings: Secondary | ICD-10-CM

## 2014-04-22 DIAGNOSIS — J4489 Other specified chronic obstructive pulmonary disease: Secondary | ICD-10-CM

## 2014-04-22 DIAGNOSIS — M25571 Pain in right ankle and joints of right foot: Secondary | ICD-10-CM

## 2014-04-22 DIAGNOSIS — R443 Hallucinations, unspecified: Secondary | ICD-10-CM

## 2014-04-22 DIAGNOSIS — G8929 Other chronic pain: Secondary | ICD-10-CM

## 2014-04-22 DIAGNOSIS — R440 Auditory hallucinations: Secondary | ICD-10-CM

## 2014-04-22 NOTE — Telephone Encounter (Signed)
Med refilled and at pharmacy

## 2014-04-22 NOTE — Patient Instructions (Addendum)
-  Will refer you to orthopedics for your foot -Your tramadol is ready on July 1st -Will call Dr. Karen Chafe office to make you an earlier appt -Will see you back in 6 months or sooner if you have any problems, pleasure meeting you!   General Instructions:   Please try to bring all your medicines next time. This will help [place] keep you safe from mistakes.   Progress Toward Treatment Goals:  Treatment Goal 07/22/2013  Blood pressure at goal    Self Care Goals & Plans:  Self Care Goal 01/27/2014  Manage my medications take my medicines as prescribed; bring my medications to every visit; refill my medications on time; follow the sick day instructions if I am sick  Monitor my health keep track of my blood pressure; keep track of my weight  Eat healthy foods eat more vegetables; eat fruit for snacks and desserts; eat baked foods instead of fried foods; eat smaller portions; drink diet soda or water instead of juice or soda  Be physically active find an activity I enjoy    No flowsheet data found.   Care Management & Community Referrals:  Referral 01/13/2013  Referrals made for care management support none needed

## 2014-04-23 DIAGNOSIS — R440 Auditory hallucinations: Secondary | ICD-10-CM | POA: Insufficient documentation

## 2014-04-23 NOTE — Assessment & Plan Note (Addendum)
Assessment: Pt with history of visual hallucinations now with reported auditory hallucinations for the past few months worsened after discontinuing atypical anti-psychotic therapy.     Plan:  -Pt to follow-up with Dr Casimiro Needle at next available date than appt scheduled for 1 month

## 2014-04-23 NOTE — Assessment & Plan Note (Addendum)
Assessment: Pt with anxiety with adjustment disorder compliant with anxiolytic therapy and recently discontinued on atypical anti-psychotic therapy 1 month ago who presents with worsening auditory hallucinations concerning for undiagnosed schizophrenia.   Plan:  -Pt to follow-up with Dr Casimiro Needle at next available date than appt scheduled for 1 month   -Continue Ativan 0.5 BID (AM & noon) -Continue hydroxyzine 50 mg at bedtime (may increase to two at night)  -Consider restarting antipsychotic therapy due to recent auditory hallucinations

## 2014-04-23 NOTE — Assessment & Plan Note (Signed)
Assessment: Pt with COPD not on maintenance therapy due to inability to afford treatment who presents with no recent exacerbations.   Plan:  -Continue albuterol nebulizer BID  -Continue albuterol inahler 2 puffs Q 6 hr PRN acute bronchospasm -Continue combivent 1 puff Q 6 hr PRN acute bronchospasm

## 2014-04-23 NOTE — Assessment & Plan Note (Addendum)
-  Pt scheduled for mammogram on 04/26/14 and repeat pap smear on 05/26/14 -Pt to have repeat colonoscopy this month

## 2014-04-23 NOTE — Progress Notes (Signed)
[name], female   DOB: 12-13-1960, 53 y.o.   MRN: [id number]    Subjective:   [name] female   DOB: Oct 12, 1961 54 y.o.   MRN: [id number]  HPI: [name] [name] is a 53 y.o. pleasant woman with past medical history of COPD, untreated chronic Hepatitis C genotype 1, alcoholic cirrhosis with esophageal varies and chronic thrombocytopenia, cervical cancer s/p TAH 2000, chronic right foot pain, anxiety, and depression who presents with chief compliant of right foot pain.   She reports spraining her right ankle when she was a little girl but no recent injuries, falls, or trauma. She has chronic pain her in right foot and ankle for years but in the past 3-4 months has had worsening pain describing it as 10/10 shooting pain from her toes to her knee preventing her from walking. She has been using a cane for over a year in part also due to chronic low back pain. She reports her two right toes have become crossed with swelling in addition to numbness of her right ankle and toes. She is able to bear weight on her right foot but recently was seen in th ED on 6/6 due to inability to ambulate. Xray imaging revealed generalized soft tissue swelling, no fracture, intact mortise, and stable small exostosis arising from the medial talus. Doppler US previously performed was negative for right LE DVT. She reports relief with vicoprofen that was given to her in the ED and tramadol which she receives monthly from her PCP. She reports no relief with applying ice and is not currently wearing a brace or ace wrap. She reports never seeing an orthopedist in the past.   She reports her mood is depressed due to chronic pain from her low back and right foot. She was recently sen by Dr. Casimiro Needle on 5/29 where her diagnosis was anxiety with adjustment disorder. She was instructed to discontinue taking Seroquel, take Ativan 0.5 mg BID (AM & at noon) one at noon, and start  Vistaril 50 mg every night (may take two at night). She reports that at the time of that visit she was hearing voices for the past few months (that were talking to each other) but did not mention this to Dr. Casimiro Needle. Since being off of the seroquel she believes the voices have worsened. She denies visual hallucinations which she has a history of in the past. She also denies suicide ideation. She has tried to call Dr. Karen Chafe office for a earlier appointment but was unsuccessful.    She reports no recent COPD exacerbations and has chronic dry cough, wheezing, and dyspnea that is at baseline. She reports using her albuterol nebulizer daily  She does not currently smoke but did in the past.     Past Medical History  Diagnosis Date  . Alcoholic cirrhosis   . COPD (chronic obstructive pulmonary disease)   . Heart palpitations   . Anxiety   . Portal hypertension   . Esophageal varices   . Cholelithiasis   . Splenomegaly   . Fatty liver   . Esophagitis 2010  . Tubulovillous adenoma of colon   . Visual hallucination     since 09/2010/notes 10/30/2012  . Hepatitis C     chronic hepatitis C and Steatohepatitis (hep grade 2, stage 2-3) per 05/13/08 liver biopsy  . Obesity   . cervical Cancer 1999  . Depression   . Shortness of breath    Current Outpatient Prescriptions  Medication Sig Dispense Refill  .  albuterol (PROVENTIL HFA;VENTOLIN HFA) 108 (90 BASE) MCG/ACT inhaler Inhale 2 puffs into the lungs every 6 (six) hours as needed for wheezing.      Marland Kitchen albuterol (PROVENTIL) (2.5 MG/3ML) 0.083% nebulizer solution Take 2.5 mg by nebulization 2 (two) times daily.      . furosemide (LASIX) 20 MG tablet Take 20 mg by mouth 2 (two) times daily.      Marland Kitchen HYDROcodone-ibuprofen (VICOPROFEN) 7.5-200 MG per tablet Take 1 tablet by mouth every 6 (six) hours as needed for moderate pain.  10 tablet  0  . hydrOXYzine (ATARAX/VISTARIL) 50 MG tablet 1 qhs may repeat  60 tablet  2  . Ipratropium-Albuterol  (COMBIVENT) 20-100 MCG/ACT AERS respimat Inhale 1 puff into the lungs every 6 (six) hours as needed for wheezing.  1 Inhaler  11  . lactulose (CHRONULAC) 10 GM/15ML solution Take 45 mLs (30 g total) by mouth every other day.  240 mL  11  . LORazepam (ATIVAN) 0.5 MG tablet 1 qam   1  qnoon  60 tablet  2  . propranolol (INDERAL) 20 MG tablet Take 1 tablet (20 mg total) by mouth daily.  30 tablet  11  . ranitidine (ZANTAC) 150 MG capsule Take 1 capsule (150 mg total) by mouth 2 (two) times daily.  60 capsule  2  . traMADol (ULTRAM) 50 MG tablet Take 1 tablet (50 mg total) by mouth every 6 (six) hours as needed for moderate pain or severe pain.  120 tablet  0   No current facility-administered medications for this visit.   Family History  Problem Relation Age of Onset  . Cervical cancer Sister   . Breast cancer Maternal Aunt   . Heart disease Father 34    died from MI  . Breast cancer Father   . Cervical cancer Daughter    History   Social History  . Marital Status: Legally Separated    Spouse Name: N/A    Number of Children: N/A  . Years of Education: N/A   Social History Main Topics  . Smoking status: Former Smoker -- 0.10 packs/day for 40 years    Types: Cigarettes    Quit date: 10/28/2010  . Smokeless tobacco: Never Used  . Alcohol Use: Yes     Comment: months since last drink  . Drug Use: No  . Sexual Activity: None   Other Topics Concern  . None   Social History Narrative  . None   Review of Systems: Review of Systems  Constitutional: Positive for weight loss and malaise/fatigue. Negative for fever and chills.  HENT: Negative for congestion and sore throat.   Eyes: Negative for blurred vision.  Respiratory: Positive for cough (chronic at baseline), shortness of breath (chronic at baseline) and wheezing (chronic at baseline).   Cardiovascular: Positive for leg swelling (chronic b/l LE R>L). Negative for chest pain and palpitations.  Gastrointestinal: Positive for  heartburn (chronic ), nausea, abdominal pain (chronic at baseline) and constipation (chronic at baseline). Negative for vomiting and blood in stool.  Genitourinary: Negative for dysuria, urgency, frequency and hematuria.  Musculoskeletal: Positive for back pain (chronic at baseline) and joint pain (right ankle and foot). Negative for falls.  Skin: Negative for rash.  Neurological: Positive for dizziness and sensory change (right ankle and toes). Negative for focal weakness and headaches.  Psychiatric/Behavioral: Positive for depression and hallucinations (auditory for past few months). Negative for suicidal ideas. The patient has insomnia.     Objective:  Physical Exam: Danley Danker  Vitals:   04/22/14 1410  BP: 107/71  Pulse: 70  Temp: 97.2 F (36.2 C)  TempSrc: Oral  Weight: 182 lb (82.555 kg)  SpO2: 95%    Physical Exam  Constitutional: She is oriented to person, place, and time. She appears well-developed and well-nourished. No distress.  HENT:  Head: Normocephalic and atraumatic.  Right Ear: External ear normal.  Left Ear: External ear normal.  Nose: Nose normal.  Mouth/Throat: Oropharynx is clear and moist. No oropharyngeal exudate.  Eyes: Conjunctivae and EOM are normal. Pupils are equal, round, and reactive to light. Right eye exhibits no discharge. Left eye exhibits no discharge. No scleral icterus.  Neck: Normal range of motion. Neck supple.  Cardiovascular: Normal rate, regular rhythm and normal heart sounds.   Pulmonary/Chest: Effort normal and breath sounds normal. No respiratory distress. She has no wheezes. She has no rales.  Abdominal: Soft. Bowel sounds are normal. She exhibits no distension. There is tenderness (mild lower abdominal ). There is no rebound and no guarding.  Musculoskeletal: She exhibits edema (Nonpitting +1  b/l LE, R>L  and of right ankle) and tenderness (right ankle and anterior midfoot).  Decreased ROM of right foot  Neurological: She is alert and  oriented to person, place, and time.  Normal 4/5 LE muscle strength, otherwise 5/5 throughout Normal sensation to light touch Ambulates with cane  Skin: Skin is warm and dry. No rash noted. She is not diaphoretic. No erythema. No pallor.  Psychiatric: Her behavior is normal. Judgment and thought content normal.  anxious    Assessment & Plan:   Please see problem list for problem-based assessment and plan

## 2014-04-23 NOTE — Assessment & Plan Note (Addendum)
Assessment:  Pt with chronic right ankle and foot pain of unknown etiology with recent ED visits with imaging indicating soft tissue ankle swelling who presents with stable pain/swelling and ability to bear weight without signs of infection.   Plan:  -Pt instructed to rest, ice, and elevate the right foot -Pt instructed to continue ambulating with cane -Tramadol 50 mg Q 6 hr PRN pain to be filled on or after July 1st  -Refer to orthopedics for further management

## 2014-04-25 ENCOUNTER — Telehealth: Payer: Self-pay | Admitting: *Deleted

## 2014-04-25 ENCOUNTER — Ambulatory Visit: Payer: Self-pay | Admitting: Internal Medicine

## 2014-04-25 NOTE — Progress Notes (Signed)
Case discussed with Dr. Rabbani at the time of the visit.  We reviewed the resident's history and exam and pertinent patient test results.  I agree with the assessment, diagnosis, and plan of care documented in the resident's note. 

## 2014-04-25 NOTE — Telephone Encounter (Signed)
Pt called - needs referral for colonoscopy - due 03/2014 with [facility]- pt has Medicaid. Pt also needs refill on Atrovent inhaler to [facility]. Hilda Blades Ditzler RN 04/25/14 2:30PM

## 2014-04-26 ENCOUNTER — Other Ambulatory Visit: Payer: Self-pay | Admitting: *Deleted

## 2014-04-26 ENCOUNTER — Other Ambulatory Visit: Payer: Self-pay | Admitting: Internal Medicine

## 2014-04-26 ENCOUNTER — Ambulatory Visit (HOSPITAL_COMMUNITY): Payer: Medicaid Other

## 2014-04-26 DIAGNOSIS — Z0000 Encounter for general adult medical examination without abnormal findings: Secondary | ICD-10-CM

## 2014-04-26 NOTE — Telephone Encounter (Signed)
Pt aware of colonoscopy referral and needs  Also albuterol inhaler. Pt has stopped  Combivent.

## 2014-04-26 NOTE — Telephone Encounter (Signed)
A referral was placed for the colonoscopy. Does she not have the Combivent inhaler?

## 2014-04-26 NOTE — Telephone Encounter (Signed)
Pt states she has stopped Combivent. Pt states she needs now a refill on albuterol inhaler along with Atrovent inhaler - [facility]. Hilda Blades Ditzler RN 04/26/14 2PM

## 2014-04-27 ENCOUNTER — Ambulatory Visit (HOSPITAL_COMMUNITY): Payer: Self-pay | Admitting: Psychiatry

## 2014-04-28 ENCOUNTER — Other Ambulatory Visit: Payer: Self-pay | Admitting: Gastroenterology

## 2014-04-28 ENCOUNTER — Encounter: Payer: Self-pay | Admitting: Internal Medicine

## 2014-04-28 ENCOUNTER — Ambulatory Visit (HOSPITAL_COMMUNITY): Payer: Medicaid Other

## 2014-04-28 DIAGNOSIS — B1920 Unspecified viral hepatitis C without hepatic coma: Secondary | ICD-10-CM

## 2014-04-28 DIAGNOSIS — B182 Chronic viral hepatitis C: Secondary | ICD-10-CM

## 2014-04-28 DIAGNOSIS — K7460 Unspecified cirrhosis of liver: Secondary | ICD-10-CM

## 2014-04-28 MED ORDER — ALBUTEROL SULFATE HFA 108 (90 BASE) MCG/ACT IN AERS: 2.0000 | INHALATION_SPRAY | Freq: Four times a day (QID) | RESPIRATORY_TRACT | Status: DC | PRN

## 2014-04-28 NOTE — Addendum Note (Signed)
Addended by: Otho Bellows on: 04/28/2014 05:43 PM   Modules accepted: Medications

## 2014-05-05 ENCOUNTER — Ambulatory Visit (HOSPITAL_COMMUNITY): Payer: Medicaid Other

## 2014-05-09 ENCOUNTER — Other Ambulatory Visit: Payer: Self-pay

## 2014-05-12 ENCOUNTER — Inpatient Hospital Stay: Admission: RE | Admit: 2014-05-12 | Payer: Self-pay | Source: Ambulatory Visit

## 2014-05-16 ENCOUNTER — Ambulatory Visit (HOSPITAL_COMMUNITY): Admission: RE | Admit: 2014-05-16 | Payer: Self-pay | Source: Ambulatory Visit

## 2014-05-17 ENCOUNTER — Other Ambulatory Visit: Payer: Self-pay

## 2014-05-24 ENCOUNTER — Other Ambulatory Visit: Payer: Self-pay | Admitting: *Deleted

## 2014-05-24 DIAGNOSIS — M549 Dorsalgia, unspecified: Principal | ICD-10-CM

## 2014-05-24 DIAGNOSIS — G8929 Other chronic pain: Secondary | ICD-10-CM

## 2014-05-25 MED ORDER — TRAMADOL HCL 50 MG PO TABS: 50.0000 mg | ORAL_TABLET | Freq: Four times a day (QID) | ORAL | Status: DC | PRN

## 2014-05-25 NOTE — Telephone Encounter (Signed)
Called to pharm, pt notified

## 2014-05-25 NOTE — Telephone Encounter (Signed)
Left pt message to rtc to clinic

## 2014-05-26 ENCOUNTER — Ambulatory Visit: Payer: Self-pay | Admitting: Obstetrics & Gynecology

## 2014-05-27 ENCOUNTER — Ambulatory Visit (INDEPENDENT_AMBULATORY_CARE_PROVIDER_SITE_OTHER): Payer: Medicaid Other | Admitting: Psychiatry

## 2014-05-27 DIAGNOSIS — F331 Major depressive disorder, recurrent, moderate: Secondary | ICD-10-CM

## 2014-05-27 DIAGNOSIS — F411 Generalized anxiety disorder: Secondary | ICD-10-CM

## 2014-05-27 DIAGNOSIS — F419 Anxiety disorder, unspecified: Secondary | ICD-10-CM

## 2014-05-27 MED ORDER — LORAZEPAM 0.5 MG PO TABS: ORAL_TABLET | ORAL | Status: DC

## 2014-05-27 MED ORDER — HYDROXYZINE HCL 50 MG PO TABS: ORAL_TABLET | ORAL | Status: DC

## 2014-05-27 MED ORDER — MIRTAZAPINE 30 MG PO TABS: 30.0000 mg | ORAL_TABLET | Freq: Every day | ORAL | Status: DC

## 2014-05-27 NOTE — Progress Notes (Signed)
[facility] MD Progress Note  05/27/2014 11:30 AM [name] [name]  MRN:  [id number] Subjective: Today the patient is distressed This patient is a number of medical conditions and a number of social issues. She clearly incompetent with her mother. Her brother recently was in the psychiatric hospital for suicidal ideation. The patient describes persistent daily depression that is worsened. Her appetite continues to be reduced and she's lost 10 pounds. Her energy level is low but it's difficult to determine etiology from it. The patient denies being suicidal. At this time the patient is on the waiting list for liver transplant from what seems to be hepatitis C liver disease. This patient does not drink. The patient says the Vistaril she is taking at night does seem to help. Presently the patient is on Medicaid and gets food stamps. The patient is getting her teeth fixed that's coming up in the next month or 2. The patient is still applying for disability for her liver disease. Today the patient feels somewhat hopeless. She continues to be quite anxious. He Ativan helps a minimal amount. Diagnosis:   DSM5: Schizophrenia Disorders:   Obsessive-Compulsive Disorders:   Trauma-Stressor Disorders:   Substance/Addictive Disorders:   Depressive Disorders:  Major Depressive Disorder - Mild (296.21) Total Time spent with patient: 30 minutes  Axis I: Major Depression, Recurrent severe  ADL's:  Intact  Sleep: Fair  Appetite:  Poor  Suicidal Ideation:  no Homicidal Ideation:  none AEB (as evidenced by):  Psychiatric Specialty Exam: Physical Exam  ROS  There were no vitals taken for this visit.There is no weight on file to calculate BMI.  General Appearance: Casual  Eye Contact::  Good  Speech:  Clear and Coherent  Volume:  Normal  Mood:  Anxious and Depressed  Affect:  Appropriate  Thought Process:  Coherent  Orientation:  Full (Time, Place, and Person)  Thought Content  Suicidal Thoughts:   No  Homicidal Thoughts:  No  Memory:  NA  Judgement:  Good  Insight:  Fair  Psychomotor Activity:  Normal  Concentration:  Good  Recall:  [place] of Knowledge:Fair  Language: Good  Akathisia:  No  Handed:  Right  AIMS (if indicated):     Assets:  Desire for Improvement  Sleep:      Musculoskeletal: Strength & Muscle Tone:  Gait & Station:  Patient leans:   Current Medications: Current Outpatient Prescriptions  Medication Sig Dispense Refill  . albuterol (PROVENTIL HFA;VENTOLIN HFA) 108 (90 BASE) MCG/ACT inhaler Inhale 2 puffs into the lungs every 6 (six) hours as needed for wheezing.  1 Inhaler  6  . albuterol (PROVENTIL) (2.5 MG/3ML) 0.083% nebulizer solution Take 2.5 mg by nebulization 2 (two) times daily.      . furosemide (LASIX) 20 MG tablet Take 20 mg by mouth 2 (two) times daily.      Marland Kitchen HYDROcodone-ibuprofen (VICOPROFEN) 7.5-200 MG per tablet Take 1 tablet by mouth every 6 (six) hours as needed for moderate pain.  10 tablet  0  . hydrOXYzine (ATARAX/VISTARIL) 50 MG tablet 1 qhs may repeat  60 tablet  3  . Ipratropium-Albuterol (COMBIVENT) 20-100 MCG/ACT AERS respimat Inhale 1 puff into the lungs every 6 (six) hours as needed for wheezing.  1 Inhaler  11  . lactulose (CHRONULAC) 10 GM/15ML solution Take 45 mLs (30 g total) by mouth every other day.  240 mL  11  . LORazepam (ATIVAN) 0.5 MG tablet 2 qam   1  qnoon  90 tablet  2  . mirtazapine (REMERON) 30 MG tablet Take 1 tablet (30 mg total) by mouth at bedtime.  30 tablet  5  . propranolol (INDERAL) 20 MG tablet Take 1 tablet (20 mg total) by mouth daily.  30 tablet  11  . ranitidine (ZANTAC) 150 MG capsule Take 1 capsule (150 mg total) by mouth 2 (two) times daily.  60 capsule  2  . traMADol (ULTRAM) 50 MG tablet Take 1 tablet (50 mg total) by mouth every 6 (six) hours as needed for moderate pain or severe pain.  120 tablet  5   No current facility-administered medications for this visit.    Lab Results: No results  found for this or any previous visit (from the past 48 hour(s)).  Physical Findings: AIMS:  , ,  ,  ,    CIWA:    COWS:     Treatment Plan Summary: At this time we will begin this patient on Remeron 30 mg at night. This is for her depression hopefully it'll have a calming effect and to help her sleep and eat better. The patient will slightly increase her Ativan to the highest dose at all. Which is 0.5 mg 2 in the morning and one at noon. The patient will continue taking Vistaril at night for sleep. This patient she'll be seen again in approximately 2-1/2 months. This patient is not suicidal.  Plan:  Medical Decision Making Problem Points:  Established problem, worsening (2) Data Points:  Review of new medications or change in dosage (2)  I certify that inpatient services furnished can reasonably be expected to improve the patient's condition.   Tewana Bohlen, [place] 05/27/2014, 11:30 AM

## 2014-06-16 ENCOUNTER — Other Ambulatory Visit: Payer: Self-pay

## 2014-06-17 ENCOUNTER — Encounter: Payer: Self-pay | Admitting: Internal Medicine

## 2014-06-22 ENCOUNTER — Ambulatory Visit (AMBULATORY_SURGERY_CENTER): Payer: Self-pay | Admitting: *Deleted

## 2014-06-22 ENCOUNTER — Telehealth: Payer: Self-pay | Admitting: *Deleted

## 2014-06-22 VITALS — Ht 62.0 in | Wt 181.2 lb

## 2014-06-22 DIAGNOSIS — Z86010 Personal history of colonic polyps: Secondary | ICD-10-CM

## 2014-06-22 MED ORDER — MOVIPREP 100 G PO SOLR: ORAL | Status: DC

## 2014-06-22 NOTE — Telephone Encounter (Signed)
Pt scheduled for new pt appt with Dr Olevia Perches on 10/27 at 9:15.  Colonoscopy cancelled.  Pt aware of need for OV first

## 2014-06-22 NOTE — Telephone Encounter (Signed)
Dr Olevia Perches: pt is scheduled for recall colon Wednesday 9/9.  Last colonoscopy 2010 at [facility]; had polyps. Several issues today during PV:  Pt says she is having rectal bleeding starting 2 weeks ago.  She has this with BM.  She also has constipation; having BM about every 3 days.  (I have given her instructions for 2 days prep with Miralax and MoviPrep just in case it is okay for direct colonoscopy.)  Pt says that she is having lower abdominal pain and RUQ pain and is going to go to ER after PV today.  Do you want this pt to have OV with you instead of direct colonoscopy?  Pt has complicated medical history also.  Thanks, Juliann Pulse

## 2014-06-22 NOTE — Telephone Encounter (Signed)
Severe liver disease, please schedule OV first

## 2014-06-22 NOTE — Progress Notes (Signed)
No allergies to eggs or soy. No problems with anesthesia.  Pt not given Emmi instructions for colonoscopy; no computer access  No oxygen use  No diet drug use  

## 2014-06-23 ENCOUNTER — Encounter: Payer: Self-pay | Admitting: *Deleted

## 2014-06-29 ENCOUNTER — Ambulatory Visit: Payer: Self-pay | Admitting: Obstetrics & Gynecology

## 2014-07-05 ENCOUNTER — Other Ambulatory Visit: Payer: Self-pay | Admitting: Internal Medicine

## 2014-07-06 ENCOUNTER — Encounter: Payer: Self-pay | Admitting: Internal Medicine

## 2014-08-03 ENCOUNTER — Encounter: Payer: Self-pay | Admitting: Internal Medicine

## 2014-08-03 ENCOUNTER — Ambulatory Visit (INDEPENDENT_AMBULATORY_CARE_PROVIDER_SITE_OTHER): Payer: Medicaid Other | Admitting: Internal Medicine

## 2014-08-03 VITALS — BP 137/85 | HR 74 | Temp 98.0°F | Ht 62.5 in | Wt 181.6 lb

## 2014-08-03 DIAGNOSIS — I10 Essential (primary) hypertension: Secondary | ICD-10-CM

## 2014-08-03 DIAGNOSIS — G8929 Other chronic pain: Secondary | ICD-10-CM

## 2014-08-03 DIAGNOSIS — Z23 Encounter for immunization: Secondary | ICD-10-CM

## 2014-08-03 DIAGNOSIS — Z0000 Encounter for general adult medical examination without abnormal findings: Secondary | ICD-10-CM

## 2014-08-03 DIAGNOSIS — J449 Chronic obstructive pulmonary disease, unspecified: Secondary | ICD-10-CM

## 2014-08-03 DIAGNOSIS — B182 Chronic viral hepatitis C: Secondary | ICD-10-CM

## 2014-08-03 DIAGNOSIS — K7460 Unspecified cirrhosis of liver: Secondary | ICD-10-CM

## 2014-08-03 DIAGNOSIS — M25571 Pain in right ankle and joints of right foot: Secondary | ICD-10-CM

## 2014-08-03 MED ORDER — TIOTROPIUM BROMIDE MONOHYDRATE 18 MCG IN CAPS: 18.0000 ug | ORAL_CAPSULE | Freq: Every day | RESPIRATORY_TRACT | Status: DC

## 2014-08-03 MED ORDER — IPRATROPIUM-ALBUTEROL 20-100 MCG/ACT IN AERS: 1.0000 | INHALATION_SPRAY | Freq: Four times a day (QID) | RESPIRATORY_TRACT | Status: DC | PRN

## 2014-08-03 NOTE — Assessment & Plan Note (Addendum)
Assessment: Pt with chronic untreated HCV genotype 1A and grade 2 stage II-III cirrhosis s/p liver biopsy in 2009 in setting of past alcohol use with history of esophageal varices and chronic thrombocytopenia, currently on liver transplant list, who presents with no stigmata of chronic liver disease.   Plan:  -Obtain complete abdominal US with elastography (pt was unable to obtain previous US ordered by Dr. Patsy Baltimore) -Continue propanolol 20 mg daily, lactulose 30 mg every other day, and furosemide 20 mg BID -Pt follows with Dr. Patsy Baltimore and Dr. Olevia Perches

## 2014-08-03 NOTE — Assessment & Plan Note (Addendum)
Assessment: Pt with chronic right ankle pain and swelling possibly due to stress fracture who presents with well-controlled pain with no recent fall, injury, or trauma.   Plan:  -Order MRI w/o contrast of right ankle (pt was unable to obtain previous MRI ordered by orthopedic surgery) -Pt instructed to continue ambulating with cane or walker   -Continue tramadol 50 mg Q 6 hr PRN pain and foot bath soaking per orthopedics

## 2014-08-03 NOTE — Patient Instructions (Addendum)
-  Will order a liver ultrasound and MRI of your right ankle and call you with appt date/time -Start taking spiriva daily for your COPD and I have refilled your combivent -Will schedule you for breathing test on Oct 14 1:00 PM -Will give you a flu shot today  -Nice seeing you today!  General Instructions:   Please bring your medicines with you each time you come to clinic.  Medicines may include prescription medications, over-the-counter medications, herbal remedies, eye drops, vitamins, or other pills.   Progress Toward Treatment Goals:  Treatment Goal 07/22/2013  Blood pressure at goal    Self Care Goals & Plans:  Self Care Goal 08/03/2014  Manage my medications take my medicines as prescribed; bring my medications to every visit; refill my medications on time  Monitor my health -  Eat healthy foods eat foods that are low in salt; eat baked foods instead of fried foods  Be physically active -    No flowsheet data found.   Care Management & Community Referrals:  Referral 01/13/2013  Referrals made for care management support none needed

## 2014-08-03 NOTE — Assessment & Plan Note (Addendum)
-  Pt received annual influenza vaccination today on 08/03/14.  -Pt to have possible colonoscopy after GI office visit on 08/23/14.   -Pt was to have mammography done in June, however no records in epic, inquire at next visit.

## 2014-08-03 NOTE — Assessment & Plan Note (Addendum)
Assessment: Pt with COPD of unknown severity not on maintenance therapy due to inability to afford treatment in the past who presents with worsening symptoms with no recent exacerbations.   Plan:  -Obtain repeat pulmonary function testing (last one in 2013) in setting of worsening symptoms   -Prescribe Spiriva 18 mcg daily for maintenance therapy (pt reports she now has insurance and will be able to afford it) -Continue albuterol nebulizer BID for now however consider changing to PRN if symptoms are better controlled after starting maintenance therapy  -Continue albuterol inahler 2 puffs Q 6 hr PRN acute bronchospasm and refill combivent 1 puff Q 6 hr PRN acute bronchospasm   ADDENDUM: Pt with evidence of Gold Stage II COPD on recent PFT on 08/24/14. Recently added long-acting bronchodilator (Spiriva) to short-acting bronchodilator therapy. Would likely benefit from pulmonary rehab which PCP is to address.

## 2014-08-03 NOTE — Progress Notes (Signed)
[name], female   DOB: 12/24/1960, 53 y.o.   MRN: [id number]    Subjective:   [name] female   DOB: 16-May-1961 53 y.o.   MRN: [id number]  HPI: [name] is a 53 y.o. pleasant woman with past medical history of COPD, untreated chronic Hepatitis C genotype 1, alcoholic cirrhosis with esophageal varies and chronic thrombocytopenia, cervical cancer s/p TAH 2000, chronic right foot pain, anxiety, and depression who presents for routine follow-up visit.   She has chronic right ankle pain and swelling and was seen by orthopedics in July who was concerned about stress fracture and ordered a MRI of her right ankle which she reports she missed the appointment due transportation issue. She was given a walker at that time which she reports using as well as a walking cane. She denies recent fall, injury, or trauma. She continues to have pain and swelling in her right ankle which is controlled with tramadol as needed for pain and soaking of her feet in water.    She reports her breathing has been worse recently but denies COPD exacerbation. She has chronic dry to productive cough, wheezing, and dyspnea. She reports using her albuterol nebulizer daily in addition to combivent. She is a past cigarette smoker.  She has chronic liver cirrhosis and is currently on a transplant list. She has untreated chronic HCV genotype 1a virus and reports her insurance did not cover medical therapy in the past, however per old records she had multiple somatic complaints and history of medical noncompliance preventing her from starting therapy in the past. She is to see GI soon before having possible colonoscopy. She was to have an abdominal US done but also missed that appointment due to transportation issue. She has chronic RUQ pain and nausea but denies jaundice, rash, arthralgias, or bleeding.         Past Medical History  Diagnosis Date  . Alcoholic cirrhosis    . COPD (chronic obstructive pulmonary disease)   . Heart palpitations   . Anxiety   . Portal hypertension   . Esophageal varices   . Cholelithiasis   . Splenomegaly   . Fatty liver   . Esophagitis 2010  . Tubulovillous adenoma of colon   . Visual hallucination     since 09/2010/notes 10/30/2012  . Hepatitis C     chronic hepatitis C and Steatohepatitis (hep grade 2, stage 2-3) per 05/13/08 liver biopsy  . Obesity   . cervical Cancer 1999  . Depression   . Shortness of breath   . Allergy   . Arthritis   . GERD (gastroesophageal reflux disease)   . Hypertension   . Gastritis    Current Outpatient Prescriptions  Medication Sig Dispense Refill  . albuterol (PROVENTIL HFA;VENTOLIN HFA) 108 (90 BASE) MCG/ACT inhaler Inhale 2 puffs into the lungs every 6 (six) hours as needed for wheezing.  1 Inhaler  6  . albuterol (PROVENTIL) (2.5 MG/3ML) 0.083% nebulizer solution Take 2.5 mg by nebulization 2 (two) times daily.      . furosemide (LASIX) 20 MG tablet Take 20 mg by mouth 2 (two) times daily.      . hydrOXYzine (ATARAX/VISTARIL) 50 MG tablet 1 qhs may repeat  60 tablet  3  . Ipratropium-Albuterol (COMBIVENT) 20-100 MCG/ACT AERS respimat Inhale 1 puff into the lungs every 6 (six) hours as needed for wheezing.  1 Inhaler  11  . lactulose (CHRONULAC) 10 GM/15ML solution Take 45 mLs (30 g total) by  mouth every other day.  240 mL  11  . LORazepam (ATIVAN) 0.5 MG tablet 2 qam   1  qnoon  90 tablet  2  . mirtazapine (REMERON) 30 MG tablet Take 1 tablet (30 mg total) by mouth at bedtime.  30 tablet  5  . MOVIPREP 100 G SOLR moviprep as directed. No substitutions  1 kit  0  . propranolol (INDERAL) 20 MG tablet Take 1 tablet (20 mg total) by mouth daily.  30 tablet  11  . ranitidine (ZANTAC) 150 MG tablet TAKE 1 CAPSULE (150 MG TOTAL) BY MOUTH TWO   (TWO) TIMES DAILY.  60 tablet  11  . traMADol (ULTRAM) 50 MG tablet Take 1 tablet (50 mg total) by mouth every 6 (six) hours as needed for moderate  pain or severe pain.  120 tablet  5   No current facility-administered medications for this visit.   Family History  Problem Relation Age of Onset  . Cervical cancer Sister   . Breast cancer Maternal Aunt   . Heart disease Father 74    died from MI  . Breast cancer Father   . Cervical cancer Daughter   . Colon cancer Paternal Aunt 56   History   Social History  . Marital Status: Legally Separated    Spouse Name: N/A    Number of Children: N/A  . Years of Education: N/A   Social History Main Topics  . Smoking status: Former Smoker -- 0.10 packs/day for 40 years    Types: Cigarettes    Quit date: 10/28/2010  . Smokeless tobacco: Never Used  . Alcohol Use: Yes     Comment: months since last drink  . Drug Use: No  . Sexual Activity: Not on file   Other Topics Concern  . Not on file   Social History Narrative  . No narrative on file   Review of Systems: Review of Systems  Constitutional: Positive for weight loss. Negative for fever and chills.  Respiratory: Positive for cough, sputum production, shortness of breath and wheezing.   Cardiovascular: Negative for chest pain and leg swelling.       Right pedal edema  Gastrointestinal: Positive for nausea, vomiting and abdominal pain (chronic RUQ ). Negative for diarrhea, constipation and blood in stool.  Genitourinary: Negative for dysuria, urgency and frequency.  Musculoskeletal: Positive for joint pain (right ankle). Negative for falls.  Skin: Negative for rash.  Psychiatric/Behavioral: Negative for depression and hallucinations. The patient does not have insomnia.     Objective:  Physical Exam: Filed Vitals:   08/03/14 1353  BP: 137/85  Pulse: 74  Temp: 98 F (36.7 C)  TempSrc: Oral  Height: 5' 2.5" (1.588 m)  Weight: 181 lb 9.6 oz (82.373 kg)  SpO2: 92%   Physical Exam  Constitutional: She is oriented to person, place, and time. She appears well-developed and well-nourished. No distress.  HENT:  Head:  Normocephalic and atraumatic.  Eyes: EOM are normal.  Neck: Normal range of motion. Neck supple.  Cardiovascular: Regular rhythm.   Pulmonary/Chest: Effort normal. No respiratory distress. She has no wheezes. She has no rales.  Poor air flow with decreased breath sounds   Abdominal: Soft. Bowel sounds are normal. She exhibits no distension. There is no tenderness. There is no rebound and no guarding.  Musculoskeletal: Normal range of motion. She exhibits no edema and no tenderness.  Neurological: She is alert and oriented to person, place, and time.  Skin: Skin is warm and  dry. No rash noted. She is not diaphoretic. No erythema. No pallor.  Psychiatric: She has a normal mood and affect. Her behavior is normal. Judgment and thought content normal.    Assessment & Plan:   Please see problem list for problem-based assessment and plan

## 2014-08-03 NOTE — Assessment & Plan Note (Signed)
Assessment: Pt with well-controlled hypertension compliant with two-class (BB & diuretic) anti-hypertensive therapy who presents with blood pressure of 137/85.   Plan:  -BP 137/85 at goal <140/90 -Continue propanolol 20 mg daily and furosemide 20 mg BID -Last CMP on 05/08/13, repeat at next visit

## 2014-08-04 ENCOUNTER — Other Ambulatory Visit: Payer: Self-pay | Admitting: Internal Medicine

## 2014-08-05 ENCOUNTER — Ambulatory Visit (INDEPENDENT_AMBULATORY_CARE_PROVIDER_SITE_OTHER): Payer: Medicaid Other | Admitting: Psychiatry

## 2014-08-05 VITALS — BP 138/94 | HR 70 | Ht 62.5 in | Wt 180.2 lb

## 2014-08-05 DIAGNOSIS — F419 Anxiety disorder, unspecified: Secondary | ICD-10-CM

## 2014-08-05 DIAGNOSIS — F332 Major depressive disorder, recurrent severe without psychotic features: Secondary | ICD-10-CM

## 2014-08-05 MED ORDER — LORAZEPAM 0.5 MG PO TABS: ORAL_TABLET | ORAL | Status: DC

## 2014-08-05 MED ORDER — MIRTAZAPINE 30 MG PO TABS: 30.0000 mg | ORAL_TABLET | Freq: Every day | ORAL | Status: DC

## 2014-08-05 MED ORDER — HYDROXYZINE HCL 50 MG PO TABS: ORAL_TABLET | ORAL | Status: DC

## 2014-08-05 NOTE — Progress Notes (Signed)
[facility] MD Progress Note  08/05/2014 11:30 AM [name] [name]  MRN:  [id number] Subjective:  Better At this time the patient seems to be better she says that the Remeron does seem to help somewhat. Overall she is sleeping and now she is eating well. She still does not have new teeth and she looks forward to that in the next few weeks. The patient still is on the liver transplant but has been told she is 100% chance of getting a liver. The patient is waiting anxiously to get on disability. Generally the patient is doing well in that she's active. She lives in a very limited environment and feels uncomfortable with her neighbors. But by her self she is able to watch TV crochet. The patient is more DTs and peaceful. She does seem to have a very limited social life. The patient is able to enjoy some things. She does not drink any alcohol or use any drugs. The patient shows no evidence of psychosis. The patient is not suicidal. Overall the patient has shown a mild improvement. Diagnosis:   DSM5: Schizophrenia Disorders:   Obsessive-Compulsive Disorders:   Trauma-Stressor Disorders:   Substance/Addictive Disorders:   Depressive Disorders:  Major Depressive Disorder - Mild (296.21) Total Time spent with patient: 30 minutes  Axis I: Major Depression, Recurrent severe  ADL's:  Intact  Sleep: Good  Appetite:  Good  Suicidal Ideation:  no Homicidal Ideation:  none AEB (as evidenced by):  Psychiatric Specialty Exam: Physical Exam  ROS  Blood pressure 138/94, pulse 70, height 5' 2.5" (1.588 m), weight 180 lb 3.2 oz (81.738 kg).Body mass index is 32.41 kg/(m^2).  General Appearance: Casual  Eye Contact::  Good  Speech:  Clear and Coherent  Volume:  Normal  Mood:  Euthymic  Affect:  Appropriate  Thought Process:  Coherent  Orientation:  Full (Time, Place, and Person)  Thought Content:  WDL  Suicidal Thoughts:  No  Homicidal Thoughts:  No  Memory:  NA  Judgement:  Good  Insight:  Good   Psychomotor Activity:  Normal  Concentration:  Good  Recall:  Good  Fund of Knowledge:Good  Language: Good  Akathisia:  No  Handed:  Right  AIMS (if indicated):     Assets:  Desire for Improvement  Sleep:      Musculoskeletal: Strength & Muscle Tone:  Gait & Station:  Patient leans:   Current Medications: Current Outpatient Prescriptions  Medication Sig Dispense Refill  . albuterol (PROVENTIL HFA;VENTOLIN HFA) 108 (90 BASE) MCG/ACT inhaler Inhale 2 puffs into the lungs every 6 (six) hours as needed for wheezing.  1 Inhaler  6  . albuterol (PROVENTIL) (2.5 MG/3ML) 0.083% nebulizer solution Take 2.5 mg by nebulization 2 (two) times daily.      . furosemide (LASIX) 20 MG tablet TAKE 1 TABLET BY MOUTH TWICE DAILY  60 tablet  2  . hydrOXYzine (ATARAX/VISTARIL) 50 MG tablet 1 qhs may repeat  60 tablet  3  . Ipratropium-Albuterol (COMBIVENT) 20-100 MCG/ACT AERS respimat Inhale 1 puff into the lungs every 6 (six) hours as needed for wheezing.  1 Inhaler  5  . lactulose (CHRONULAC) 10 GM/15ML solution Take 45 mLs (30 g total) by mouth every other day.  240 mL  11  . LORazepam (ATIVAN) 0.5 MG tablet 2 qam   1  qnoon  90 tablet  4  . mirtazapine (REMERON) 30 MG tablet Take 1 tablet (30 mg total) by mouth at bedtime.  30 tablet  5  .  MOVIPREP 100 G SOLR moviprep as directed. No substitutions  1 kit  0  . propranolol (INDERAL) 20 MG tablet Take 1 tablet (20 mg total) by mouth daily.  30 tablet  11  . ranitidine (ZANTAC) 150 MG tablet TAKE 1 CAPSULE (150 MG TOTAL) BY MOUTH TWO   (TWO) TIMES DAILY.  60 tablet  11  . tiotropium (SPIRIVA HANDIHALER) 18 MCG inhalation capsule Place 1 capsule (18 mcg total) into inhaler and inhale daily.  30 capsule  2  . traMADol (ULTRAM) 50 MG tablet Take 1 tablet (50 mg total) by mouth every 6 (six) hours as needed for moderate pain or severe pain.  120 tablet  5   No current facility-administered medications for this visit.    Lab Results: No results found for  this or any previous visit (from the past 48 hour(s)).  Physical Findings: AIMS:  , ,  ,  ,    CIWA:    COWS:     Treatment Plan Summary: At this time we'll continue all the medication she's taking. This includes Ativan 0.5 mg 2 in the morning and one at night, Remeron 30 mg at night and Vistaril 50 mg at night. The patient is being followed closely medically. Hopefully she'll be getting new dentures and possibly experience a liver transplant in the next year. At this time all her medical symptoms seem to be stable. The patient mood is distinctly improved. She is less needy less anxious. This patient is not suicidal.  Plan:  Medical Decision Making Problem Points:  Established problem, stable/improving (1) Data Points:  Review of medication regiment & side effects (2)  I certify that inpatient services furnished can reasonably be expected to improve the patient's condition.   [place] 08/05/2014, 11:30 AM

## 2014-08-05 NOTE — Telephone Encounter (Signed)
Pls sch Dec or Jan appt Dr Eulas Post

## 2014-08-08 NOTE — Progress Notes (Signed)
Case discussed with Dr. Rabbani soon after the resident saw the patient.  We reviewed the resident's history and exam and pertinent patient test results.  I agree with the assessment, diagnosis and plan of care documented in the resident's note. 

## 2014-08-10 ENCOUNTER — Encounter (HOSPITAL_COMMUNITY): Payer: Self-pay

## 2014-08-12 ENCOUNTER — Ambulatory Visit: Payer: Self-pay | Admitting: Obstetrics & Gynecology

## 2014-08-17 ENCOUNTER — Encounter (HOSPITAL_COMMUNITY): Payer: Self-pay

## 2014-08-23 ENCOUNTER — Ambulatory Visit: Payer: Self-pay | Admitting: Internal Medicine

## 2014-08-23 NOTE — Addendum Note (Signed)
Addended by: Yvonna Alanis E on: 08/23/2014 02:06 PM   Modules accepted: Orders

## 2014-08-24 ENCOUNTER — Ambulatory Visit (HOSPITAL_COMMUNITY)
Admission: RE | Admit: 2014-08-24 | Discharge: 2014-08-24 | Disposition: A | Discharge status: Home / Self Care | Payer: Medicaid Other | Source: Ambulatory Visit | Attending: Internal Medicine | Admitting: Internal Medicine

## 2014-08-24 ENCOUNTER — Encounter: Payer: Self-pay | Admitting: Internal Medicine

## 2014-08-24 DIAGNOSIS — Z720 Tobacco use: Secondary | ICD-10-CM | POA: Diagnosis not present

## 2014-08-24 DIAGNOSIS — J449 Chronic obstructive pulmonary disease, unspecified: Secondary | ICD-10-CM | POA: Diagnosis present

## 2014-08-24 LAB — PULMONARY FUNCTION TEST
FEF 25-75 Post: 0.63 L/sec
FEF 25-75 Pre: 1.23 L/sec
FEF 25-75 Pre: 0.8 L/sec
FEF2575-%Change-Post: -48 %
FEF2575-%Pred-Post: 26 %
FEF2575-%Pred-Pre: 51 %
FEF2575-%Pred-Pre: 31 %
FEV1-%Change-Post: -4 %
FEV1-%Change-Post: -59 %
FEV1-%PRED-POST: 59 %
FEV1-%Pred-Post: 20 %
FEV1-%Pred-Pre: 61 %
FEV1-%Pred-Pre: 50 %
FEV1-POST: 1.42 L
FEV1-Post: 0.53 L
FEV1-Pre: 1.48 L
FEV1-Pre: 1.3 L
FEV1FVC-%CHANGE-POST: -6 %
FEV1FVC-%Change-Post: -27 %
FEV1FVC-%Pred-Pre: 87 %
FEV1FVC-%Pred-Pre: 70 %
FEV6-%Change-Post: 3 %
FEV6-%Change-Post: -49 %
FEV6-%PRED-PRE: 71 %
FEV6-%Pred-Post: 74 %
FEV6-%Pred-Post: 37 %
FEV6-%Pred-Pre: 73 %
FEV6-POST: 2.2 L
FEV6-PRE: 2.13 L
FEV6-Post: 1.2 L
FEV6-Pre: 2.35 L
FEV6FVC-%CHANGE-POST: 1 %
FEV6FVC-%Change-Post: -8 %
FEV6FVC-%PRED-POST: 103 %
FEV6FVC-%PRED-PRE: 101 %
FEV6FVC-%Pred-Post: 93 %
FEV6FVC-%Pred-Pre: 103 %
FVC-%CHANGE-POST: 2 %
FVC-%Change-Post: -44 %
FVC-%Pred-Post: 71 %
FVC-%Pred-Post: 39 %
FVC-%Pred-Pre: 70 %
FVC-%Pred-Pre: 71 %
FVC-PRE: 2.15 L
FVC-Post: 2.2 L
FVC-Post: 1.31 L
POST FEV6/FVC RATIO: 100 %
PRE FEV6/FVC RATIO: 99 %
Post FEV1/FVC ratio: 65 %
Post FEV1/FVC ratio: 40 %
Post FEV6/FVC ratio: 91 %
Pre FEV1/FVC ratio: 69 %
Pre FEV1/FVC ratio: 56 %
Pre FEV6/FVC Ratio: 100 %
RV % PRED: 224 %
RV % pred: 149 %
RV: 3.92 L
RV: 2.65 L
TLC % PRED: 133 %
TLC % pred: 103 %
TLC: 6.13 L
TLC: 4.99 L

## 2014-08-24 MED ORDER — ALBUTEROL SULFATE (2.5 MG/3ML) 0.083% IN NEBU
2.5000 mg | INHALATION_SOLUTION | Freq: Once | RESPIRATORY_TRACT | Status: AC
  Administered 2014-08-24: 2.5 mg via RESPIRATORY_TRACT

## 2014-08-24 NOTE — Progress Notes (Signed)
The patient's chart has been reviewed by Dr. Delfin Edis  and the recommendations are noted below.  Follow-up necessary. Contact patient and schedule for first available opening.  Outcome of communication with the patient:  Letter sent to patient.  Per Dr Olevia Perches, charge no show fee.

## 2014-08-29 ENCOUNTER — Encounter: Payer: Self-pay | Admitting: Internal Medicine

## 2014-09-06 ENCOUNTER — Ambulatory Visit (HOSPITAL_COMMUNITY)
Admission: RE | Admit: 2014-09-06 | Discharge: 2014-09-06 | Disposition: A | Discharge status: Home / Self Care | Payer: Medicaid Other | Source: Ambulatory Visit | Attending: Internal Medicine | Admitting: Internal Medicine

## 2014-09-06 DIAGNOSIS — K7460 Unspecified cirrhosis of liver: Secondary | ICD-10-CM | POA: Diagnosis present

## 2014-09-06 DIAGNOSIS — B182 Chronic viral hepatitis C: Secondary | ICD-10-CM | POA: Insufficient documentation

## 2014-09-06 DIAGNOSIS — K8020 Calculus of gallbladder without cholecystitis without obstruction: Secondary | ICD-10-CM | POA: Insufficient documentation

## 2014-10-11 ENCOUNTER — Telehealth: Payer: Self-pay | Admitting: *Deleted

## 2014-10-11 NOTE — Telephone Encounter (Signed)
CALLED [facility] [phone]-PAMELA ./ PATIENT WILL NEED TO CALL OFFICE TO SCHEDULE. QUESTIONS THEY NEED TO ASK PATIENT.  Jannelly CONTACTED AND GIVEN INFORMATION AND PHONE NUMBER TO CALL IMAGING OFFICE TO SET UP APPOINTMENT.  MR APPROVED UP TO 12-18-015. PATIENT AWARE OF THIS.

## 2014-10-12 ENCOUNTER — Ambulatory Visit
Admission: RE | Admit: 2014-10-12 | Discharge: 2014-10-12 | Disposition: A | Discharge status: Home / Self Care | Payer: Medicaid Other | Source: Ambulatory Visit | Attending: Internal Medicine | Admitting: Internal Medicine

## 2014-10-12 DIAGNOSIS — G8929 Other chronic pain: Secondary | ICD-10-CM

## 2014-10-12 DIAGNOSIS — M25571 Pain in right ankle and joints of right foot: Principal | ICD-10-CM

## 2014-10-27 ENCOUNTER — Other Ambulatory Visit: Payer: Self-pay | Admitting: Internal Medicine

## 2014-10-31 ENCOUNTER — Other Ambulatory Visit: Payer: Self-pay | Admitting: Internal Medicine

## 2014-11-01 ENCOUNTER — Ambulatory Visit (INDEPENDENT_AMBULATORY_CARE_PROVIDER_SITE_OTHER): Payer: Medicaid Other | Admitting: Internal Medicine

## 2014-11-01 ENCOUNTER — Encounter: Payer: Self-pay | Admitting: Internal Medicine

## 2014-11-01 ENCOUNTER — Ambulatory Visit: Payer: Self-pay | Admitting: Internal Medicine

## 2014-11-01 VITALS — BP 110/80 | HR 64 | Ht 62.0 in | Wt 183.6 lb

## 2014-11-01 DIAGNOSIS — K5909 Other constipation: Secondary | ICD-10-CM

## 2014-11-01 MED ORDER — MOVIPREP 100 G PO SOLR: 1.0000 | Freq: Once | ORAL | Status: DC

## 2014-11-01 MED ORDER — FLUCONAZOLE 100 MG PO TABS: ORAL_TABLET | ORAL | Status: DC

## 2014-11-01 MED ORDER — LACTULOSE 10 GM/15ML PO SOLN: 30.0000 g | Freq: Every day | ORAL | Status: DC

## 2014-11-01 MED ORDER — POLYETHYLENE GLYCOL 3350 17 GM/SCOOP PO POWD: ORAL | Status: DC

## 2014-11-01 NOTE — Progress Notes (Signed)
[name] [name] 1960/12/15 [id number]  Note: This dictation was prepared with Dragon digital system. Any transcriptional errors that result from this procedure are unintentional.   History of Present Illness:  This is a 54 year old female with chronic hepatitis C, cirrhosis. She is on a transplant list at [facility] and is followed by Dr Edwyna Ready. She has a history of major depressive disorder and anxiety followed by Dr.Plovsky. She is here to discuss having a screening colonoscopy. She was once scheduled for colonoscopy last fall but did not show up. She had an abdominal ultrasound in November 2015 with elastography which showed multiple gallstones. Her gallbladder wall was 5 mm. Liver echotexture was consistent with cirrhosis. Meta vir fibrosis score F3 and F4  consistent with high risk of fibrosis. An upper endoscopy in December 2012 showed esophageal varices. She is here to schedule a colonoscopy. She describes irregular bowel habits with predominant constipation for which she takes lactulose 45 cc daily and sometimes twice a day but runs out of medication before it's time to refill it.She is currently out of Lactulose.She reports having colonoscopy in the past but no record available- apparently she was difficult to sedate.EGD by Dr Ardis Hughs  In 09/2011 confirmed esophageal varices.    Past Medical History  Diagnosis Date  . Alcoholic cirrhosis   . COPD (chronic obstructive pulmonary disease)   . Heart palpitations   . Anxiety   . Portal hypertension   . Esophageal varices   . Cholelithiasis   . Splenomegaly   . Fatty liver   . Esophagitis 2010  . Tubulovillous adenoma of colon   . Visual hallucination     since 09/2010/notes 10/30/2012  . Hepatitis C     chronic hepatitis C and Steatohepatitis (hep grade 2, stage 2-3) per 05/13/08 liver biopsy  . Obesity   . cervical Cancer 1999  . Depression   . Shortness of breath   . Allergy   . Arthritis   . GERD (gastroesophageal reflux disease)    . Hypertension   . Gastritis     Past Surgical History  Procedure Laterality Date  . Cervical cancer surgery    . Abdominal hysterectomy  1999  . Tubal ligation  1982  . Esophagogastroduodenoscopy  10/18/2011    Procedure: ESOPHAGOGASTRODUODENOSCOPY (EGD);  Surgeon: Owens Loffler, MD;  Location: [facility];  Service: Endoscopy;  Laterality: N/A;    Allergies  Allergen Reactions  . Amitriptyline     hallucinations     Family history and social history have been reviewed.  Review of Systems: Heartburn. Denies dysphagia. Constipation. Denies rectal bleeding, positive for weight gain  The remainder of the 10 point ROS is negative except as outlined in the H&P  Physical Exam: General Appearance Well developed, in no distress, obese  Eyes  Non icteric  HEENT  Non traumatic, normocephalic  Mouth No lesion, tongue papillated, no cheilosis,edentulous Neck Supple without adenopathy, thyroid not enlarged, no carotid bruits, no JVD Lungs Clear to auscultation bilaterally COR Normal S1, normal S2, regular rhythm, no murmur, quiet precordium Abdomen Protuberant large with normoactive bowel sounds. Liver edge at costal margin. No ascites. Spleen not palpable  Rectal Deferred to colonoscopy  Extremities  No pedal edema Skin No lesions, palmar erythema , Candida dermatitis under the breast and in groin  Neurological Alert and oriented x 3, no asterixis  Psychological Normal mood and affect  Assessment and Plan:   Problem #8 54 year old white female who is due for a recall colonoscopy. A prior exam was done  several years ago but no records available. Apparently, she had problems being sedated. This time, we will use propofol. I have discussed the prep as well as sedation with the patient. Hopefully, this time she will keep her appointment.  Problem #2 Gastroesophageal reflux. We will have her to continue ranitidine 150 mg twice a day and antireflux measures.   Problem #3 Chronic  constipation. We will refill lactulose 45 cc daily and let her start Miralax 9-17 g daily as needed for constipation.  Problem #4 Hepatitis C, followed by Dr.Zack. She is on the transplant list at [facility].    Delfin Edis 11/01/2014

## 2014-11-01 NOTE — Patient Instructions (Addendum)
You have been scheduled for a colonoscopy. Please follow written instructions given to you at your visit today.  Please pick up your prep kit at the pharmacy within the next 1-3 days. If you use inhalers (even only as needed), please bring them with you on the day of your procedure. Your physician has requested that you go to www.startemmi.com and enter the access code given to you at your visit today. This web site gives a general overview about your procedure. However, you should still follow specific instructions given to you by our office regarding your preparation for the procedure.  We have sent the following medications to your pharmacy for you to pick up at your convenience: Diflucan-Take 1 tablet once per week x 12 weeks Lactuose-3 tablespoons (45 mg) daily Miralax-1/2-1 capful once daily  CC: Dr Dierdre Harness.Eulas Post, Dr Edwyna Ready, hepatology

## 2014-11-02 ENCOUNTER — Telehealth (HOSPITAL_COMMUNITY): Payer: Self-pay

## 2014-11-08 ENCOUNTER — Other Ambulatory Visit: Payer: Self-pay | Admitting: *Deleted

## 2014-11-08 DIAGNOSIS — M549 Dorsalgia, unspecified: Principal | ICD-10-CM

## 2014-11-08 DIAGNOSIS — G8929 Other chronic pain: Secondary | ICD-10-CM

## 2014-11-09 ENCOUNTER — Ambulatory Visit (INDEPENDENT_AMBULATORY_CARE_PROVIDER_SITE_OTHER): Payer: Medicaid Other | Admitting: Psychiatry

## 2014-11-09 ENCOUNTER — Other Ambulatory Visit: Payer: Self-pay | Admitting: Internal Medicine

## 2014-11-09 VITALS — BP 146/90 | HR 74 | Ht 62.5 in | Wt 189.0 lb

## 2014-11-09 DIAGNOSIS — F419 Anxiety disorder, unspecified: Secondary | ICD-10-CM

## 2014-11-09 DIAGNOSIS — F332 Major depressive disorder, recurrent severe without psychotic features: Secondary | ICD-10-CM

## 2014-11-09 MED ORDER — LORAZEPAM 0.5 MG PO TABS: ORAL_TABLET | ORAL | Status: DC

## 2014-11-09 MED ORDER — MIRTAZAPINE 30 MG PO TABS: 30.0000 mg | ORAL_TABLET | Freq: Every day | ORAL | Status: DC

## 2014-11-09 MED ORDER — HYDROXYZINE HCL 50 MG PO TABS: ORAL_TABLET | ORAL | Status: DC

## 2014-11-09 MED ORDER — TRAZODONE HCL 50 MG PO TABS: ORAL_TABLET | ORAL | Status: DC
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
## 2014-11-10 NOTE — Telephone Encounter (Signed)
alled to [employer]

## 2014-11-30 ENCOUNTER — Telehealth (HOSPITAL_COMMUNITY): Payer: Self-pay

## 2014-11-30 MED ORDER — TRAZODONE HCL 100 MG PO TABS: ORAL_TABLET | ORAL | Status: DC

## 2014-11-30 NOTE — Telephone Encounter (Signed)
traZODone (DESYREL) 100 MG tablet 90 tablet 3 11/30/2014       Sig: 2 qhs And if not sleeping In 1 week Increase to 3 qhs     E-Prescribing Status: Receipt confirmed by pharmacy (11/30/2014 2:41 PM EST)       Medication sent by Dr. Casimiro Needle. Patient notified.

## 2014-11-30 NOTE — Telephone Encounter (Signed)
Patient called with report the Trazadone she started is not helping with sleep and would like to discuss a change.  States has tried other medications with her primary care provider in the past but they were not effective and Dr. Marisue [place] office suggested she contact you back about further recommendations or changes.  Patient would like a call back to discuss possible changes or adjustments. Next evaluation 02/08/15.

## 2014-12-07 ENCOUNTER — Telehealth: Payer: Self-pay | Admitting: *Deleted

## 2014-12-07 NOTE — Telephone Encounter (Signed)
Pt called out of self urinary catheter - pt states she has been self cath at time of cancer surgery. Had a suprapubic cath for 3 months. Pt uses [employer] [phone]. Talked to company and will send a form to clinic to be sign by Dr Eulas Post.  Asked pt if she needed an appt - has always done self cath - will call if any change. Viera Okonski RN 12/07/14 9:50AM

## 2014-12-09 ENCOUNTER — Encounter: Payer: Self-pay | Admitting: Internal Medicine

## 2014-12-09 ENCOUNTER — Ambulatory Visit (INDEPENDENT_AMBULATORY_CARE_PROVIDER_SITE_OTHER): Payer: Medicaid Other | Admitting: Internal Medicine

## 2014-12-09 VITALS — BP 104/73 | HR 77 | Temp 98.1°F | Ht 63.0 in | Wt 192.3 lb

## 2014-12-09 DIAGNOSIS — N319 Neuromuscular dysfunction of bladder, unspecified: Secondary | ICD-10-CM

## 2014-12-09 DIAGNOSIS — R519 Headache, unspecified: Secondary | ICD-10-CM

## 2014-12-09 DIAGNOSIS — R51 Headache: Secondary | ICD-10-CM

## 2014-12-09 DIAGNOSIS — R0981 Nasal congestion: Secondary | ICD-10-CM | POA: Insufficient documentation

## 2014-12-09 MED ORDER — SODIUM CHLORIDE-SODIUM BICARB 2300-700 MG NA KIT: PACK | NASAL | Status: DC

## 2014-12-09 NOTE — Assessment & Plan Note (Addendum)
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  -Reports that she did not have to use it as frequently though has recently had to use it more often ever since she started treatment for hepatitis C at [facility] with vikeira and Ribavarin.  -She has reported these symptoms to her doctor at [facility] from whom she gets these treatments from though was not taken seriously. -She denies any fevers, flank pain, dysuria though reports that it is impossible for her to void even after sitting on the commode for 5-10 minutes.  Assessment -Chart review confirms that there has been some mention of self-catheterization.  -Further characterization of her urinary retention requires a bladder scan and postvoid residuals.  Plan -Check UA -Advised patient to have bladder scan and postvoid residuals done here in office though she is not agreeable to this -She would like referral to urology which I did at this time -As I'm not her PCP, I cannot authorize any paperwork for the ordering of her catheter supplies per Medicaid policies and will inform her PCP.  ADDENDUM 12/13/2014  9:08 AM:  -Except for few bacteria and positive nitrites, no leukocytes are reassuring for no UTI. However, she reports intermittently self catheterizing several home which does call her UA results and to question. As she was asymptomatic at the time of interview, we will not treat for UTI at this time. -She does have new glucosuria which should be assessed further at her follow-up visit she has not been worked up for diabetes in the past.

## 2014-12-09 NOTE — Patient Instructions (Signed)
Please try to bring all your medicines next time. This will help [place] keep you safe from mistakes.  We will call you about the appointment with urology.  Try using a Netipot for your sinuses.
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
## 2014-12-09 NOTE — Progress Notes (Signed)
   Subjective:    Patient ID: Deyra Perdomo, female    DOB: 05-11-1961, 54 y.o.   MRN: [id number]  HPI Ms. Steinhaus is a 54 year old female with hypertension, COPD, chronic hep C with cirrhosis who presents today for her during her urinary catheter supplies. Please see assessment & plan for documentation of each problem.   Review of Systems  Genitourinary: Positive for decreased urine volume and difficulty urinating. Negative for dysuria, urgency and flank pain.       Objective:   Physical Exam Constitutional: She is oriented to person, place, and time. Pleasant appearing. No distress.  Head: Normocephalic and atraumatic.  Eyes: Conjunctivae are normal.  Cardiovascular: Normal rate, regular rhythm and normal heart sounds.  Exam reveals no gallop and no friction rub.   No murmur heard. Pulmonary/Chest: Effort normal. No respiratory distress. She has no wheezes. She has no rales.  Neurological: She is alert and oriented to person, place, and time. Coordination normal.  Back: No CVA tenderness noted. Skin: Skin is warm and dry. She is not diaphoretic.  Psychiatric: Her behavior is normal.         Assessment & Plan:

## 2014-12-10 LAB — URINALYSIS, COMPLETE
CASTS: NONE SEEN
CRYSTALS: NONE SEEN
Glucose, UA: 100 mg/dL — AB
Hgb urine dipstick: NEGATIVE
Ketones, ur: NEGATIVE mg/dL
LEUKOCYTES UA: NEGATIVE
Nitrite: POSITIVE — AB
Protein, ur: NEGATIVE mg/dL
Specific Gravity, Urine: 1.022 (ref 1.005–1.030)
Urobilinogen, UA: 4 mg/dL — ABNORMAL HIGH (ref 0.0–1.0)
pH: 6 (ref 5.0–8.0)

## 2014-12-13 ENCOUNTER — Telehealth: Payer: Self-pay | Admitting: Internal Medicine

## 2014-12-13 NOTE — Progress Notes (Signed)
INTERNAL MEDICINE TEACHING ATTENDING ADDENDUM - Lauria Depoy, MD: I reviewed and discussed at the time of visit with the resident Dr. Patel, the patient's medical history, physical examination, diagnosis and results of pertinent tests and treatment and I agree with the patient's care as documented.  

## 2014-12-13 NOTE — Telephone Encounter (Signed)
2/16 - Pt canceled her procedure scheduled for 2/17 this afternoon.

## 2014-12-13 NOTE — Telephone Encounter (Signed)
Please charge. cancelation fee. Do not reschedule unless she has an appointment , first available.

## 2014-12-14 ENCOUNTER — Encounter: Payer: Medicaid Other | Admitting: Internal Medicine

## 2014-12-21 ENCOUNTER — Other Ambulatory Visit: Payer: Self-pay | Admitting: Internal Medicine

## 2014-12-23 NOTE — Telephone Encounter (Signed)
Tried to call pt and schedule an appointment for next week before med is refilled.  No answer.

## 2014-12-23 NOTE — Telephone Encounter (Signed)
Talked with pt, she has 2 bottles of lasix at home.  SHe is only using 1 a day. I scheduled her an appointment for Monday to review use of lasix, if she needs to continue taking it. She also is c/o cold.

## 2014-12-26 ENCOUNTER — Encounter: Payer: Self-pay | Admitting: Internal Medicine

## 2014-12-27 ENCOUNTER — Ambulatory Visit: Payer: Self-pay | Admitting: Internal Medicine

## 2014-12-30 ENCOUNTER — Other Ambulatory Visit: Payer: Self-pay | Admitting: Internal Medicine

## 2015-01-04 ENCOUNTER — Telehealth: Payer: Self-pay | Admitting: Internal Medicine

## 2015-01-04 NOTE — Telephone Encounter (Signed)
Call to patient to confirm appointment for 01/05/15 at 2:45 lmtcb

## 2015-01-05 ENCOUNTER — Ambulatory Visit (INDEPENDENT_AMBULATORY_CARE_PROVIDER_SITE_OTHER): Payer: Medicaid Other | Admitting: Internal Medicine

## 2015-01-05 ENCOUNTER — Encounter: Payer: Self-pay | Admitting: Internal Medicine

## 2015-01-05 VITALS — BP 96/59 | HR 70 | Temp 98.2°F | Ht 63.0 in | Wt 187.9 lb

## 2015-01-05 DIAGNOSIS — K7460 Unspecified cirrhosis of liver: Secondary | ICD-10-CM

## 2015-01-05 DIAGNOSIS — B182 Chronic viral hepatitis C: Secondary | ICD-10-CM

## 2015-01-05 DIAGNOSIS — Z0000 Encounter for general adult medical examination without abnormal findings: Secondary | ICD-10-CM

## 2015-01-05 DIAGNOSIS — R81 Glycosuria: Secondary | ICD-10-CM | POA: Insufficient documentation

## 2015-01-05 DIAGNOSIS — R0981 Nasal congestion: Secondary | ICD-10-CM

## 2015-01-05 DIAGNOSIS — I872 Venous insufficiency (chronic) (peripheral): Secondary | ICD-10-CM

## 2015-01-05 MED ORDER — FLUTICASONE PROPIONATE 50 MCG/ACT NA SUSP: 1.0000 | Freq: Every day | NASAL | Status: DC

## 2015-01-05 NOTE — Assessment & Plan Note (Addendum)
Pt was supposed to have repeat colonoscopy in June but for some reason did not. She is seeing Dr. Olevia Perches and is to have a repeat scope soon. Pt missed her last appt with Dr. Olevia Perches, so the procedure has yet to be scheduled.  - Encouraged f/u with Dr. Olevia Perches as soon as possible

## 2015-01-05 NOTE — Progress Notes (Signed)
Medicine attending: Medical history, presenting problems, physical findings, and medications, reviewed with Dr Kathryn Glenn and I concur with her evaluation and management plan. 

## 2015-01-05 NOTE — Progress Notes (Signed)
[name], female   DOB: Jul 06, 1961, 54 y.o.   MRN: [id number]  Subjective:   [name] female   DOB: [birth year] 54 y.o.   MRN: [id number]  HPI: [name] [name] is a 54 y.o. F w/ PMH hypertension, COPD, chronic hep C with cirrhosis presents for a routine follow up.   Glucosuria was seen on UA at her last visit in Feb. A1c about 1 year ago was normal at 5.5.   Past Medical History  Diagnosis Date  . Alcoholic cirrhosis   . COPD (chronic obstructive pulmonary disease)   . Heart palpitations   . Anxiety   . Portal hypertension   . Esophageal varices   . Cholelithiasis   . Splenomegaly   . Fatty liver   . Esophagitis 2010  . Tubulovillous adenoma of colon   . Visual hallucination     since 09/2010/notes 10/30/2012  . Hepatitis C     chronic hepatitis C and Steatohepatitis (hep grade 2, stage 2-3) per 05/13/08 liver biopsy  . Obesity   . cervical Cancer 1999  . Depression   . Shortness of breath   . Allergy   . Arthritis   . GERD (gastroesophageal reflux disease)   . Hypertension   . Gastritis    Current Outpatient Prescriptions  Medication Sig Dispense Refill  . albuterol (PROVENTIL) (2.5 MG/3ML) 0.083% nebulizer solution Take 2.5 mg by nebulization 2 (two) times daily.    . fluconazole (DIFLUCAN) 100 MG tablet Take 1 tablet by mouth once per week x 12 weeks. 12 tablet 0  . furosemide (LASIX) 20 MG tablet TAKE 1 TABLET BY MOUTH TWICE DAILY 60 tablet 1  . hydrOXYzine (ATARAX/VISTARIL) 50 MG tablet 1 qhs may repeat 60 tablet 3  . Ipratropium-Albuterol (COMBIVENT) 20-100 MCG/ACT AERS respimat Inhale 1 puff into the lungs every 6 (six) hours as needed for wheezing. 1 Inhaler 5  . lactulose (CHRONULAC) 10 GM/15ML solution Take 45 mLs (30 g total) by mouth daily. 1350 mL 2  . LORazepam (ATIVAN) 0.5 MG tablet 2 qam   1  qnoon 90 tablet 5  . mirtazapine (REMERON) 30 MG tablet Take 1 tablet (30 mg total) by mouth at bedtime. 30 tablet 5  . MOVIPREP 100 G  SOLR Take 1 kit (200 g total) by mouth once. 1 kit 0  . pantoprazole (PROTONIX) 40 MG tablet TAKE 1 TABLET (40 MG TOTAL) BY MOUTH DAILY. 90 tablet 1  . polyethylene glycol powder (GLYCOLAX/MIRALAX) powder Take 1/2-1 capful (9-17 g) by mouth once daily 527 g 2  . PROAIR HFA 108 (90 BASE) MCG/ACT inhaler INHALE TWO   PUFFS INTO THE LUNGS EVERY 6 HOURS AS NEEDED FOR WHEEZING. 8.5 g 6  . propranolol (INDERAL) 20 MG tablet TAKE 1 TABLET (20 MG TOTAL) BY MOUTH DAILY. 90 tablet 3  . ranitidine (ZANTAC) 150 MG tablet TAKE 1 CAPSULE (150 MG TOTAL) BY MOUTH TWO   (TWO) TIMES DAILY. 60 tablet 11  . Sodium Chloride-Sodium Bicarb (CLASSIC NETI POT SINUS WASH) 2300-700 MG KIT Please use as directed in the package. 1 each 0  . SPIRIVA HANDIHALER 18 MCG inhalation capsule PLACE 1 CAPSULE (18 MCG TOTAL) INTO INHALER AND INHALE DAILY. 30 capsule 2  . traMADol (ULTRAM) 50 MG tablet Take 1 tablet by mouth every 6 hours as needed for pain 120 tablet 5  . traZODone (DESYREL) 100 MG tablet 2 qhs  And if not sleeping  In 1 week  Increase to 3 qhs 90  tablet 3   No current facility-administered medications for this visit.   Family History  Problem Relation Age of Onset  . Cervical cancer Sister   . Breast cancer Maternal Aunt   . Heart disease Father 19    died from MI  . Breast cancer Father   . Cervical cancer Daughter   . Colon cancer Paternal Aunt 37   History   Social History  . Marital Status: Legally Separated    Spouse Name: N/A  . Number of Children: N/A  . Years of Education: N/A   Social History Main Topics  . Smoking status: Former Smoker -- 0.10 packs/day for 40 years    Types: Cigarettes    Quit date: 10/28/2010  . Smokeless tobacco: Never Used  . Alcohol Use: 0.0 oz/week    0 Standard drinks or equivalent per week     Comment: months since last drink  . Drug Use: No  . Sexual Activity: Not on file   Other Topics Concern  . None   Social History Narrative   Review of  Systems: Constitutional: Denies fever. +chills, malaise, and fatigue.  HEENT: +congestion, rhinorrhea   Respiratory: +SOB, cough.   Cardiovascular: Denies chest pain. + leg swelling.  Gastrointestinal: Denies nausea, vomiting, abdominal pain  Genitourinary: +pt using I&O catheters at home, urine cloudy but no pain  Musculoskeletal: +back pain causing pain with ambulation Skin: Denies rash  Neurological: Denies seizures or syncope  Psychiatric/Behavioral: Denies suicidal ideation, mood changes, confusion, sleeping better.  Objective:  Physical Exam: Filed Vitals:   01/05/15 1503  BP: 96/59  Pulse: 70  Temp: 98.2 F (36.8 C)  TempSrc: Oral  Height: '5\' 3"'  (1.6 m)  Weight: 187 lb 14.4 oz (85.231 kg)  SpO2: 97%   Constitutional: Vital signs reviewed.  Patient is a well-developed and well-nourished female in no acute distress and cooperative with exam.  Head: Normocephalic and atraumatic Nose: Mucus present in nares.  Turbinates normal Mouth: No erythema or exudates. Tongue appears a bit dry. Eyes: EOMI. + scleral icterus.  Neck: Normal ROM, No JVD Cardiovascular: RRR, no MRG Pulmonary/Chest: Normal respiratory effort, CTAB, no wheezes, rales, or rhonchi. Occasional dry cough on exam. Abdominal: Soft. Non-tender, non-distended Musculoskeletal: +stiffness. Moves all 4 extremities. Neurological: A&O x3, cranial nerve II-XII are grossly intact  Skin: Warm, dry and intact.  Psychiatric: Normal mood and affect. Speech and behavior is normal.   Assessment & Plan:   Please refer to Problem List based Assessment and Plan

## 2015-01-05 NOTE — Assessment & Plan Note (Addendum)
Glucosuria was seen on UA at her last visit in Feb. A1c about 1 year ago was normal at 5.5.  - Will repeat A1c today --> pt left w/o A1c, will check at next visit.

## 2015-01-05 NOTE — Patient Instructions (Addendum)
Please be sure to call Dr. Leilani Able and let him know that the Linzie Collin is making you feel bad.  Continue to use the [facility] and after using, blow your nose really well. The use the Flonase nasal spray with 2 sprays in each nostril once a day- you do not need to use this medicine if you do not have nasal congestion.   Try wearing compression stockings to help with the swelling in your legs.  Please ask Dr. Leilani Able to send [place] your medical records from him, including your labs.     General Instructions:   Please bring your medicines with you each time you come to clinic.  Medicines may include prescription medications, over-the-counter medications, herbal remedies, eye drops, vitamins, or other pills.   Progress Toward Treatment Goals:  Treatment Goal 07/22/2013  Blood pressure at goal    Self Care Goals & Plans:  Self Care Goal 01/05/2015  Manage my medications take my medicines as prescribed; bring my medications to every visit; refill my medications on time  Monitor my health -  Eat healthy foods drink diet soda or water instead of juice or soda; eat more vegetables; eat foods that are low in salt; eat baked foods instead of fried foods  Be physically active -    No flowsheet data found.   Care Management & Community Referrals:  Referral 01/13/2013  Referrals made for care management support none needed

## 2015-01-05 NOTE — Assessment & Plan Note (Signed)
BLE with thicken skin and darkening discoloration. Trace to 1+ pitting edema to BLE that has not been improved with Lasix per pt.  - Will d/c the lasix. - Suggested trying compression stockings and keeping legs elevated.

## 2015-01-05 NOTE — Assessment & Plan Note (Addendum)
Pt started on Viekira 2 months ago. She states that her viral load has reduced, but the medications are making her feel fatigued. She is monitored frequently by Dr. Patsy Baltimore per pt and has frequent labs checked. - She is to call Dr. Patsy Baltimore and speak with home about these side effects.  - Getting records, including labs from Dr. Patsy Baltimore

## 2015-01-05 NOTE — Assessment & Plan Note (Signed)
Pt states that the Netti pot does help her congestion. Recommended continuing this and an antihistamine and begin Flonase. - Continue Netti Pot - Flonase 2sp ea nostril daily after using [employer]. - Continue OTC antihistamine - F/u in 44mo

## 2015-01-10 NOTE — Telephone Encounter (Signed)
Due to her Insurance- Pt not Billed Colon  Cx fee

## 2015-01-18 ENCOUNTER — Other Ambulatory Visit: Payer: Self-pay | Admitting: Internal Medicine

## 2015-01-18 ENCOUNTER — Other Ambulatory Visit (HOSPITAL_COMMUNITY): Payer: Self-pay | Admitting: Psychiatry

## 2015-01-19 ENCOUNTER — Other Ambulatory Visit: Payer: Self-pay | Admitting: Internal Medicine

## 2015-01-20 ENCOUNTER — Other Ambulatory Visit (HOSPITAL_COMMUNITY): Payer: Self-pay | Admitting: Psychiatry

## 2015-01-26 ENCOUNTER — Ambulatory Visit: Payer: Self-pay | Admitting: Internal Medicine

## 2015-01-26 ENCOUNTER — Encounter: Payer: Self-pay | Admitting: Internal Medicine

## 2015-02-02 ENCOUNTER — Telehealth: Payer: Self-pay | Admitting: Internal Medicine

## 2015-02-02 NOTE — Telephone Encounter (Signed)
Call to patient to confirm appointment for 02/06/15 at 2:45 lmtcb

## 2015-02-06 ENCOUNTER — Ambulatory Visit: Payer: Medicaid Other | Admitting: Internal Medicine

## 2015-02-08 ENCOUNTER — Ambulatory Visit (INDEPENDENT_AMBULATORY_CARE_PROVIDER_SITE_OTHER): Payer: Medicaid Other | Admitting: Psychiatry

## 2015-02-08 VITALS — BP 97/65 | HR 66 | Ht 63.0 in | Wt 193.0 lb

## 2015-02-08 DIAGNOSIS — F331 Major depressive disorder, recurrent, moderate: Secondary | ICD-10-CM

## 2015-02-08 DIAGNOSIS — F341 Dysthymic disorder: Secondary | ICD-10-CM

## 2015-02-08 MED ORDER — MIRTAZAPINE 45 MG PO TABS: 30.0000 mg | ORAL_TABLET | Freq: Every day | ORAL | Status: DC

## 2015-02-08 MED ORDER — CLONAZEPAM 1 MG PO TABS: ORAL_TABLET | ORAL | Status: DC

## 2015-02-08 MED ORDER — TRAZODONE HCL 100 MG PO TABS: ORAL_TABLET | ORAL | Status: DC
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
## 2015-02-10 NOTE — Telephone Encounter (Signed)
Patient had a new order e-scribed 11/09/14 plus 3 refills - too early to authorize at this time.

## 2015-02-15 ENCOUNTER — Encounter: Payer: Self-pay | Admitting: Internal Medicine

## 2015-02-23 ENCOUNTER — Encounter: Payer: Self-pay | Admitting: Pulmonary Disease

## 2015-02-23 ENCOUNTER — Other Ambulatory Visit: Payer: Self-pay | Admitting: Internal Medicine

## 2015-02-27 ENCOUNTER — Ambulatory Visit: Payer: Self-pay | Admitting: Pulmonary Disease

## 2015-03-03 ENCOUNTER — Ambulatory Visit: Payer: Self-pay | Admitting: Pulmonary Disease

## 2015-03-04 ENCOUNTER — Other Ambulatory Visit (HOSPITAL_COMMUNITY): Payer: Self-pay

## 2015-03-04 ENCOUNTER — Encounter (HOSPITAL_COMMUNITY): Payer: Self-pay

## 2015-03-04 ENCOUNTER — Inpatient Hospital Stay (HOSPITAL_COMMUNITY)
Admission: EM | Admit: 2015-03-04 | Discharge: 2015-03-09 | DRG: 442 | Disposition: A | Discharge status: Home / Self Care | Payer: Medicaid Other | Attending: Internal Medicine | Admitting: Internal Medicine

## 2015-03-04 DIAGNOSIS — N390 Urinary tract infection, site not specified: Secondary | ICD-10-CM | POA: Diagnosis present

## 2015-03-04 DIAGNOSIS — I8510 Secondary esophageal varices without bleeding: Secondary | ICD-10-CM | POA: Diagnosis present

## 2015-03-04 DIAGNOSIS — N179 Acute kidney failure, unspecified: Secondary | ICD-10-CM | POA: Diagnosis present

## 2015-03-04 DIAGNOSIS — Z79899 Other long term (current) drug therapy: Secondary | ICD-10-CM

## 2015-03-04 DIAGNOSIS — T50995A Adverse effect of other drugs, medicaments and biological substances, initial encounter: Secondary | ICD-10-CM | POA: Diagnosis present

## 2015-03-04 DIAGNOSIS — Z9119 Patient's noncompliance with other medical treatment and regimen: Secondary | ICD-10-CM | POA: Diagnosis present

## 2015-03-04 DIAGNOSIS — K7031 Alcoholic cirrhosis of liver with ascites: Secondary | ICD-10-CM | POA: Diagnosis present

## 2015-03-04 DIAGNOSIS — I10 Essential (primary) hypertension: Secondary | ICD-10-CM | POA: Diagnosis present

## 2015-03-04 DIAGNOSIS — K7682 Hepatic encephalopathy: Secondary | ICD-10-CM | POA: Diagnosis present

## 2015-03-04 DIAGNOSIS — E876 Hypokalemia: Secondary | ICD-10-CM | POA: Diagnosis not present

## 2015-03-04 DIAGNOSIS — J449 Chronic obstructive pulmonary disease, unspecified: Secondary | ICD-10-CM | POA: Diagnosis present

## 2015-03-04 DIAGNOSIS — F329 Major depressive disorder, single episode, unspecified: Secondary | ICD-10-CM | POA: Diagnosis present

## 2015-03-04 DIAGNOSIS — T50905A Adverse effect of unspecified drugs, medicaments and biological substances, initial encounter: Secondary | ICD-10-CM | POA: Diagnosis present

## 2015-03-04 DIAGNOSIS — R791 Abnormal coagulation profile: Secondary | ICD-10-CM | POA: Diagnosis present

## 2015-03-04 DIAGNOSIS — K7581 Nonalcoholic steatohepatitis (NASH): Secondary | ICD-10-CM | POA: Diagnosis present

## 2015-03-04 DIAGNOSIS — K7460 Unspecified cirrhosis of liver: Secondary | ICD-10-CM

## 2015-03-04 DIAGNOSIS — G9340 Encephalopathy, unspecified: Secondary | ICD-10-CM

## 2015-03-04 DIAGNOSIS — E669 Obesity, unspecified: Secondary | ICD-10-CM | POA: Diagnosis present

## 2015-03-04 DIAGNOSIS — Z66 Do not resuscitate: Secondary | ICD-10-CM | POA: Diagnosis present

## 2015-03-04 DIAGNOSIS — Z87891 Personal history of nicotine dependence: Secondary | ICD-10-CM

## 2015-03-04 DIAGNOSIS — E8809 Other disorders of plasma-protein metabolism, not elsewhere classified: Secondary | ICD-10-CM | POA: Diagnosis present

## 2015-03-04 DIAGNOSIS — I959 Hypotension, unspecified: Secondary | ICD-10-CM | POA: Diagnosis present

## 2015-03-04 DIAGNOSIS — F419 Anxiety disorder, unspecified: Secondary | ICD-10-CM | POA: Diagnosis present

## 2015-03-04 DIAGNOSIS — K7290 Hepatic failure, unspecified without coma: Principal | ICD-10-CM | POA: Diagnosis present

## 2015-03-04 DIAGNOSIS — G8929 Other chronic pain: Secondary | ICD-10-CM | POA: Diagnosis present

## 2015-03-04 DIAGNOSIS — D7589 Other specified diseases of blood and blood-forming organs: Secondary | ICD-10-CM | POA: Diagnosis present

## 2015-03-04 DIAGNOSIS — B182 Chronic viral hepatitis C: Secondary | ICD-10-CM | POA: Diagnosis present

## 2015-03-04 DIAGNOSIS — Z8541 Personal history of malignant neoplasm of cervix uteri: Secondary | ICD-10-CM

## 2015-03-04 DIAGNOSIS — K210 Gastro-esophageal reflux disease with esophagitis: Secondary | ICD-10-CM | POA: Diagnosis present

## 2015-03-04 DIAGNOSIS — K766 Portal hypertension: Secondary | ICD-10-CM | POA: Diagnosis present

## 2015-03-04 DIAGNOSIS — D696 Thrombocytopenia, unspecified: Secondary | ICD-10-CM | POA: Diagnosis present

## 2015-03-04 DIAGNOSIS — Z6837 Body mass index (BMI) 37.0-37.9, adult: Secondary | ICD-10-CM

## 2015-03-04 DIAGNOSIS — Z8619 Personal history of other infectious and parasitic diseases: Secondary | ICD-10-CM | POA: Diagnosis present

## 2015-03-04 DIAGNOSIS — I8500 Esophageal varices without bleeding: Secondary | ICD-10-CM | POA: Diagnosis present

## 2015-03-04 HISTORY — DX: Hepatic failure, unspecified without coma: K72.90

## 2015-03-04 HISTORY — DX: Adverse effect of unspecified drugs, medicaments and biological substances, initial encounter: T50.905A

## 2015-03-04 LAB — AMMONIA: Ammonia: 129 umol/L — ABNORMAL HIGH (ref 9–35)

## 2015-03-04 LAB — CBC
HCT: 38.9 % (ref 36.0–46.0)
Hemoglobin: 12.3 g/dL (ref 12.0–15.0)
MCH: 34.2 pg — ABNORMAL HIGH (ref 26.0–34.0)
MCHC: 31.6 g/dL (ref 30.0–36.0)
MCV: 108.1 fL — AB (ref 78.0–100.0)
PLATELETS: 139 10*3/uL — AB (ref 150–400)
RBC: 3.6 MIL/uL — ABNORMAL LOW (ref 3.87–5.11)
RDW: 18.2 % — ABNORMAL HIGH (ref 11.5–15.5)
WBC: 6.2 10*3/uL (ref 4.0–10.5)

## 2015-03-04 LAB — BASIC METABOLIC PANEL
Anion gap: 4 — ABNORMAL LOW (ref 5–15)
BUN: 13 mg/dL (ref 6–20)
CO2: 22 mmol/L (ref 22–32)
Calcium: 7.6 mg/dL — ABNORMAL LOW (ref 8.9–10.3)
Chloride: 107 mmol/L (ref 101–111)
Creatinine, Ser: 1.23 mg/dL — ABNORMAL HIGH (ref 0.44–1.00)
GFR calc Af Amer: 56 mL/min — ABNORMAL LOW (ref >60)
GFR calc non Af Amer: 48 mL/min — ABNORMAL LOW (ref >60)
GLUCOSE: 152 mg/dL — AB (ref 70–99)
POTASSIUM: 3.4 mmol/L — AB (ref 3.5–5.1)
SODIUM: 133 mmol/L — AB (ref 135–145)

## 2015-03-04 LAB — I-STAT CHEM 8, ED
BUN: 17 mg/dL (ref 6–20)
CREATININE: 1.3 mg/dL — AB (ref 0.44–1.00)
Calcium, Ion: 1.01 mmol/L — ABNORMAL LOW (ref 1.12–1.23)
Chloride: 105 mmol/L (ref 101–111)
GLUCOSE: 148 mg/dL — AB (ref 70–99)
HCT: 44 % (ref 36.0–46.0)
HEMOGLOBIN: 15 g/dL (ref 12.0–15.0)
Potassium: 3.5 mmol/L (ref 3.5–5.1)
Sodium: 143 mmol/L (ref 135–145)
TCO2: 21 mmol/L (ref 0–100)

## 2015-03-04 LAB — HEPATIC FUNCTION PANEL
ALBUMIN: 2.3 g/dL — AB (ref 3.5–5.0)
ALT: 33 U/L (ref 14–54)
AST: 68 U/L — ABNORMAL HIGH (ref 15–41)
Alkaline Phosphatase: 156 U/L — ABNORMAL HIGH (ref 38–126)
Bilirubin, Direct: 15.6 mg/dL — ABNORMAL HIGH (ref 0.1–0.5)
Indirect Bilirubin: 8.4 mg/dL — ABNORMAL HIGH (ref 0.3–0.9)
TOTAL PROTEIN: 5.8 g/dL — AB (ref 6.5–8.1)
Total Bilirubin: 24 mg/dL (ref 0.3–1.2)

## 2015-03-04 LAB — CBG MONITORING, ED: Glucose-Capillary: 122 mg/dL — ABNORMAL HIGH (ref 70–99)

## 2015-03-04 MED ORDER — SODIUM CHLORIDE 0.9 % IV BOLUS (SEPSIS)
500.0000 mL | Freq: Once | INTRAVENOUS | Status: AC
  Administered 2015-03-04: 500 mL via INTRAVENOUS

## 2015-03-04 NOTE — ED Provider Notes (Signed)
CSN: [id number]     Arrival date & time 03/04/15  2114 History  This chart was scribed for Veryl Speak, MD by Randa Evens, ED Scribe. This patient was seen in room A08C/A08C and the patient's care was started at 11:01 PM.     Chief Complaint  Patient presents with  . Weakness  . Hepatic Disease   Patient is a 54 y.o. female presenting with weakness. The history is provided by a relative. No language interpreter was used.  Weakness This is a new problem. The current episode started more than 2 days ago. The problem has not changed since onset.Pertinent negatives include no chest pain. Nothing aggravates the symptoms. Nothing relieves the symptoms. She has tried nothing for the symptoms.   HPI Comments: [name] [name] is a 54 y.o. female with PMHx alcoholic cirrhosis, COPD, and Hepatitis C brought in by ambulance, who presents to the Emergency Department complaining of worsening generalized weakness for the past couple days. Family states that earlier today they were not able to get in contact with her and decided to go check on her at her house. They states that she was not able to come to the door and the police had to kick the door in. Pt denies pain, but states that she feels very tired. Family is unsure of the medications she takes for cirrhosis and hepatitis C. Family states she has been more confused that normal. Pt doesn't report any treatments tried. Pt doesn't report any alleviating or worsening factor.     Past Medical History  Diagnosis Date  . Alcoholic cirrhosis   . COPD (chronic obstructive pulmonary disease)   . Heart palpitations   . Anxiety   . Portal hypertension   . Esophageal varices   . Cholelithiasis   . Splenomegaly   . Fatty liver   . Esophagitis 2010  . Tubulovillous adenoma of colon   . Visual hallucination     since 09/2010/notes 10/30/2012  . Hepatitis C     chronic hepatitis C and Steatohepatitis (hep grade 2, stage 2-3) per 05/13/08 liver biopsy  .  Obesity   . cervical Cancer 1999  . Depression   . Shortness of breath   . Allergy   . Arthritis   . GERD (gastroesophageal reflux disease)   . Hypertension   . Gastritis    Past Surgical History  Procedure Laterality Date  . Cervical cancer surgery    . Abdominal hysterectomy  1999  . Tubal ligation  1982  . Esophagogastroduodenoscopy  10/18/2011    Procedure: ESOPHAGOGASTRODUODENOSCOPY (EGD);  Surgeon: Owens Loffler, MD;  Location: [facility];  Service: Endoscopy;  Laterality: N/A;   Family History  Problem Relation Age of Onset  . Cervical cancer Sister   . Breast cancer Maternal Aunt   . Heart disease Father 31    died from MI  . Breast cancer Father   . Cervical cancer Daughter   . Colon cancer Paternal Aunt 44   History  Substance Use Topics  . Smoking status: Former Smoker -- 0.10 packs/day for 40 years    Types: Cigarettes    Quit date: 10/28/2010  . Smokeless tobacco: Never Used  . Alcohol Use: 0.0 oz/week    0 Standard drinks or equivalent per week     Comment: months since last drink   OB History    Gravida Para Term Preterm AB TAB SAB Ectopic Multiple Living   _0 2  Review of Systems  Cardiovascular: Negative for chest pain.  Neurological: Positive for weakness.  Psychiatric/Behavioral: Positive for confusion.  All other systems reviewed and are negative.    Allergies  Amitriptyline  Home Medications   Prior to Admission medications   Medication Sig Start Date End Date Taking? Authorizing Provider  albuterol (PROVENTIL) (2.5 MG/3ML) 0.083% nebulizer solution Take 2.5 mg by nebulization 2 (two) times daily. 06/03/13  Yes Blain Pais, MD  clonazePAM (KLONOPIN) 1 MG tablet 1  tid 02/08/15  Yes Norma Fredrickson, MD  COMBIVENT RESPIMAT 20-100 MCG/ACT AERS respimat INHALE 1 PUFF INTO THE LUNGS EVERY 6 (SIX) HOURS AS NEEDED FOR WHEEZING. 02/28/15  Yes Milagros Loll, MD  fluconazole (DIFLUCAN) 100 MG tablet Take 1 tablet by mouth  once per week x 12 weeks. 11/01/14  Yes Lafayette Dragon, MD  fluticasone (FLONASE) 50 MCG/ACT nasal spray Place 1 spray into both nostrils daily. 01/05/15 01/05/16 Yes Otho Bellows, MD  hydrOXYzine (ATARAX/VISTARIL) 50 MG tablet 1 qhs may repeat 11/09/14  Yes Norma Fredrickson, MD  [place]-Paritapre-Ritona-Dasab 12.5-75-50 &250 MG TBPK Take by mouth.   Yes Historical Provider, MD  polyethylene glycol powder (GLYCOLAX/MIRALAX) powder TAKE 1/2-1 CAPFUL (9-17 G) BY MOUTH ONCE DAILY 01/18/15  Yes Lafayette Dragon, MD  PROAIR HFA 108 (90 BASE) MCG/ACT inhaler INHALE TWO   PUFFS INTO THE LUNGS EVERY 6 HOURS AS NEEDED FOR WHEEZING. 11/10/14  Yes Otho Bellows, MD  propranolol (INDERAL) 20 MG tablet TAKE 1 TABLET (20 MG TOTAL) BY MOUTH DAILY. 01/02/15  Yes Otho Bellows, MD  ranitidine (ZANTAC) 150 MG tablet TAKE 1 CAPSULE (150 MG TOTAL) BY MOUTH TWO   (TWO) TIMES DAILY. 07/06/14  Yes Otho Bellows, MD  SPIRIVA HANDIHALER 18 MCG inhalation capsule PLACE 1 CAPSULE (18 MCG TOTAL) INTO INHALER AND INHALE DAILY. 01/18/15  Yes Otho Bellows, MD  traMADol Veatrice Bourbon) 50 MG tablet Take 1 tablet by mouth every 6 hours as needed for pain 11/10/14  Yes Otho Bellows, MD  traZODone (DESYREL) 100 MG tablet 2 qhs  And if not sleeping  In 1 week  Increase to 3 qhs 02/08/15  Yes Norma Fredrickson, MD  furosemide (LASIX) 20 MG tablet TAKE 1 TABLET BY MOUTH TWICE DAILY Patient not taking: Reported on 01/05/2015 11/01/14   Otho Bellows, MD  lactulose [facility]) 10 GM/15ML solution Take 45 ml (30 grams) by mouth daily Patient not taking: Reported on 03/04/2015 01/18/15   Lafayette Dragon, MD  mirtazapine (REMERON) 45 MG tablet Take 0.5 tablets (22.5 mg total) by mouth at bedtime. Patient not taking: Reported on 03/04/2015 02/08/15   Norma Fredrickson, MD  MOVIPREP 100 G SOLR Take 1 kit (200 g total) by mouth once. Patient not taking: Reported on 01/05/2015 11/01/14   Lafayette Dragon, MD  pantoprazole (PROTONIX) 40 MG tablet TAKE 1 TABLET (40 MG TOTAL) BY  MOUTH DAILY. Patient not taking: Reported on 03/04/2015 01/02/15   Otho Bellows, MD  Sodium Chloride-Sodium Bicarb (CLASSIC NETI POT SINUS [place]) 2300-700 MG KIT Please use as directed in the package. Patient not taking: Reported on 01/05/2015 12/09/14   Rushil Sherrye Payor, MD   BP 107/72 mmHg  Pulse 65  Temp(Src) 98.1 F (36.7 C) (Oral)  Resp 17  Ht _0  (1.6 m)  Wt 193 lb (87.544 kg)  BMI 34.20 kg/m2  SpO2 91%   Physical Exam  Constitutional: She is oriented to person, place, and time. She appears well-developed and  well-nourished. No distress.  HENT:  Head: Normocephalic and atraumatic.  Eyes: EOM are normal. Pupils are equal, round, and reactive to light. Scleral icterus is present.  Neck: Neck supple. No tracheal deviation present.  Cardiovascular: Normal rate.   Pulmonary/Chest: Effort normal. No respiratory distress.  Musculoskeletal: Normal range of motion.  Neurological: She is alert and oriented to person, place, and time.  Pt is somnolent, but arousal able nods back to sleep.   Skin: Skin is warm and dry.  Skin is jaundiced.   Psychiatric: She has a normal mood and affect. Her behavior is normal.  Nursing note and vitals reviewed.   ED Course  Procedures (including critical care time) DIAGNOSTIC STUDIES: Oxygen Saturation is 91% on RA, normal by my interpretation.    COORDINATION OF CARE: 11:07 PM-Discussed treatment plan with pt at bedside and pt agreed to plan.     Labs Review Labs Reviewed  CBC - Abnormal; Notable for the following:    RBC 3.60 (*)    MCV 108.1 (*)    MCH 34.2 (*)    RDW 18.2 (*)    Platelets 139 (*)    All other components within normal limits  BASIC METABOLIC PANEL - Abnormal; Notable for the following:    Sodium 133 (*)    Potassium 3.4 (*)    Glucose, Bld 152 (*)    Creatinine, Ser 1.23 (*)    Calcium 7.6 (*)    GFR calc non Af Amer 48 (*)    GFR calc Af Amer 56 (*)    Anion gap 4 (*)    All other components within normal limits   HEPATIC FUNCTION PANEL - Abnormal; Notable for the following:    Total Protein 5.8 (*)    Albumin 2.3 (*)    AST 68 (*)    Alkaline Phosphatase 156 (*)    Total Bilirubin 24.0 (*)    Bilirubin, Direct 15.6 (*)    Indirect Bilirubin 8.4 (*)    All other components within normal limits  AMMONIA - Abnormal; Notable for the following:    Ammonia 129 (*)    All other components within normal limits  CBG MONITORING, ED - Abnormal; Notable for the following:    Glucose-Capillary 122 (*)    All other components within normal limits  I-STAT CHEM 8, ED - Abnormal; Notable for the following:    Creatinine, Ser 1.30 (*)    Glucose, Bld 148 (*)    Calcium, Ion 1.01 (*)    All other components within normal limits  URINALYSIS, ROUTINE W REFLEX MICROSCOPIC    Imaging Review No results found.  ED ECG REPORT   Date: 03/05/2015  Rate: 63  Rhythm: normal sinus rhythm  QRS Axis: normal  Intervals: normal  ST/T Wave abnormalities: normal  Conduction Disutrbances:none  Narrative Interpretation:   Old EKG Reviewed: none available  I have personally reviewed the EKG tracing and agree with the computerized printout as noted.   MDM   Final diagnoses:  None      Patient with history of cirrhosis and hepatitis C. She is brought for evaluation of increased somnolence and confusion for the past several days. She appears acutely on chronically ill and jaundiced. Her workup reveals an elevated ammonia level and her history and exam are consistent with a hepatic encephalopathy. She will require admission for this. I've spoken with the teaching service who will admit the patient.   I personally performed the services described in this documentation, which  was scribed in my presence. The recorded information has been reviewed and is accurate.      Veryl Speak, MD 03/05/15 [phone]

## 2015-03-04 NOTE — ED Notes (Signed)
PER EMS: pt from home, pt reports increased in weakness. Hx of liver failure. Pt arrives jaundiced, but more than usual per daughter report. She states they doubled her Remeron. BP-122/80, HR-80, RR-16, CBG-175. Pt is also confused more than usual per EMS.

## 2015-03-05 ENCOUNTER — Inpatient Hospital Stay (HOSPITAL_COMMUNITY): Payer: Medicaid Other

## 2015-03-05 ENCOUNTER — Encounter (HOSPITAL_COMMUNITY): Payer: Self-pay

## 2015-03-05 DIAGNOSIS — N179 Acute kidney failure, unspecified: Secondary | ICD-10-CM | POA: Diagnosis present

## 2015-03-05 DIAGNOSIS — R4182 Altered mental status, unspecified: Secondary | ICD-10-CM

## 2015-03-05 DIAGNOSIS — N390 Urinary tract infection, site not specified: Secondary | ICD-10-CM | POA: Diagnosis present

## 2015-03-05 DIAGNOSIS — Z8619 Personal history of other infectious and parasitic diseases: Secondary | ICD-10-CM | POA: Diagnosis not present

## 2015-03-05 DIAGNOSIS — K210 Gastro-esophageal reflux disease with esophagitis: Secondary | ICD-10-CM | POA: Diagnosis present

## 2015-03-05 DIAGNOSIS — F419 Anxiety disorder, unspecified: Secondary | ICD-10-CM | POA: Diagnosis present

## 2015-03-05 DIAGNOSIS — F329 Major depressive disorder, single episode, unspecified: Secondary | ICD-10-CM | POA: Diagnosis present

## 2015-03-05 DIAGNOSIS — I8510 Secondary esophageal varices without bleeding: Secondary | ICD-10-CM | POA: Diagnosis not present

## 2015-03-05 DIAGNOSIS — K766 Portal hypertension: Secondary | ICD-10-CM | POA: Diagnosis present

## 2015-03-05 DIAGNOSIS — B182 Chronic viral hepatitis C: Secondary | ICD-10-CM | POA: Diagnosis present

## 2015-03-05 DIAGNOSIS — D7589 Other specified diseases of blood and blood-forming organs: Secondary | ICD-10-CM | POA: Diagnosis present

## 2015-03-05 DIAGNOSIS — Z8541 Personal history of malignant neoplasm of cervix uteri: Secondary | ICD-10-CM | POA: Diagnosis not present

## 2015-03-05 DIAGNOSIS — Z87891 Personal history of nicotine dependence: Secondary | ICD-10-CM | POA: Diagnosis not present

## 2015-03-05 DIAGNOSIS — E8809 Other disorders of plasma-protein metabolism, not elsewhere classified: Secondary | ICD-10-CM | POA: Diagnosis present

## 2015-03-05 DIAGNOSIS — Z9119 Patient's noncompliance with other medical treatment and regimen: Secondary | ICD-10-CM | POA: Diagnosis present

## 2015-03-05 DIAGNOSIS — K713 Toxic liver disease with chronic persistent hepatitis: Secondary | ICD-10-CM | POA: Diagnosis not present

## 2015-03-05 DIAGNOSIS — K7581 Nonalcoholic steatohepatitis (NASH): Secondary | ICD-10-CM | POA: Diagnosis present

## 2015-03-05 DIAGNOSIS — K7290 Hepatic failure, unspecified without coma: Principal | ICD-10-CM

## 2015-03-05 DIAGNOSIS — Z87898 Personal history of other specified conditions: Secondary | ICD-10-CM

## 2015-03-05 DIAGNOSIS — K7682 Hepatic encephalopathy: Secondary | ICD-10-CM | POA: Diagnosis present

## 2015-03-05 DIAGNOSIS — K7460 Unspecified cirrhosis of liver: Secondary | ICD-10-CM | POA: Diagnosis not present

## 2015-03-05 DIAGNOSIS — T50995A Adverse effect of other drugs, medicaments and biological substances, initial encounter: Secondary | ICD-10-CM | POA: Diagnosis present

## 2015-03-05 DIAGNOSIS — E876 Hypokalemia: Secondary | ICD-10-CM | POA: Diagnosis not present

## 2015-03-05 DIAGNOSIS — D696 Thrombocytopenia, unspecified: Secondary | ICD-10-CM | POA: Diagnosis present

## 2015-03-05 DIAGNOSIS — Z79899 Other long term (current) drug therapy: Secondary | ICD-10-CM | POA: Diagnosis not present

## 2015-03-05 DIAGNOSIS — I10 Essential (primary) hypertension: Secondary | ICD-10-CM | POA: Diagnosis present

## 2015-03-05 DIAGNOSIS — Z66 Do not resuscitate: Secondary | ICD-10-CM | POA: Diagnosis present

## 2015-03-05 DIAGNOSIS — K7200 Acute and subacute hepatic failure without coma: Secondary | ICD-10-CM | POA: Diagnosis not present

## 2015-03-05 DIAGNOSIS — I959 Hypotension, unspecified: Secondary | ICD-10-CM | POA: Diagnosis present

## 2015-03-05 DIAGNOSIS — E669 Obesity, unspecified: Secondary | ICD-10-CM | POA: Diagnosis present

## 2015-03-05 DIAGNOSIS — J449 Chronic obstructive pulmonary disease, unspecified: Secondary | ICD-10-CM

## 2015-03-05 DIAGNOSIS — Z6837 Body mass index (BMI) 37.0-37.9, adult: Secondary | ICD-10-CM | POA: Diagnosis not present

## 2015-03-05 DIAGNOSIS — K7031 Alcoholic cirrhosis of liver with ascites: Secondary | ICD-10-CM | POA: Diagnosis present

## 2015-03-05 DIAGNOSIS — R791 Abnormal coagulation profile: Secondary | ICD-10-CM | POA: Diagnosis present

## 2015-03-05 DIAGNOSIS — G8929 Other chronic pain: Secondary | ICD-10-CM | POA: Diagnosis present

## 2015-03-05 DIAGNOSIS — I8500 Esophageal varices without bleeding: Secondary | ICD-10-CM | POA: Diagnosis present

## 2015-03-05 DIAGNOSIS — K219 Gastro-esophageal reflux disease without esophagitis: Secondary | ICD-10-CM

## 2015-03-05 LAB — HEPATITIS PANEL, ACUTE
HCV Ab: REACTIVE — AB
HEP B C IGM: NONREACTIVE
Hep A IgM: NONREACTIVE
Hepatitis B Surface Ag: NEGATIVE

## 2015-03-05 LAB — COMPREHENSIVE METABOLIC PANEL
ALK PHOS: 136 U/L — AB (ref 38–126)
ALT: 33 U/L (ref 14–54)
ALT: 30 U/L (ref 14–54)
ANION GAP: 10 (ref 5–15)
AST: 57 U/L — ABNORMAL HIGH (ref 15–41)
AST: 62 U/L — AB (ref 15–41)
Albumin: 2 g/dL — ABNORMAL LOW (ref 3.5–5.0)
Albumin: 1.9 g/dL — ABNORMAL LOW (ref 3.5–5.0)
Alkaline Phosphatase: 139 U/L — ABNORMAL HIGH (ref 38–126)
Anion gap: 9 (ref 5–15)
BILIRUBIN TOTAL: 18.4 mg/dL — AB (ref 0.3–1.2)
BILIRUBIN TOTAL: 20.9 mg/dL — AB (ref 0.3–1.2)
BUN: 12 mg/dL (ref 6–20)
BUN: 13 mg/dL (ref 6–20)
CHLORIDE: 109 mmol/L (ref 101–111)
CO2: 20 mmol/L — AB (ref 22–32)
CO2: 22 mmol/L (ref 22–32)
CREATININE: 1.23 mg/dL — AB (ref 0.44–1.00)
Calcium: 7.6 mg/dL — ABNORMAL LOW (ref 8.9–10.3)
Calcium: 7.4 mg/dL — ABNORMAL LOW (ref 8.9–10.3)
Chloride: 111 mmol/L (ref 101–111)
Creatinine, Ser: 1.05 mg/dL — ABNORMAL HIGH (ref 0.44–1.00)
GFR calc Af Amer: 56 mL/min — ABNORMAL LOW (ref >60)
GFR calc non Af Amer: 48 mL/min — ABNORMAL LOW (ref >60)
GFR, EST NON AFRICAN AMERICAN: 59 mL/min — AB (ref >60)
GLUCOSE: 135 mg/dL — AB (ref 70–99)
Glucose, Bld: 147 mg/dL — ABNORMAL HIGH (ref 70–99)
POTASSIUM: 3.2 mmol/L — AB (ref 3.5–5.1)
Potassium: 3.2 mmol/L — ABNORMAL LOW (ref 3.5–5.1)
Sodium: 141 mmol/L (ref 135–145)
Sodium: 140 mmol/L (ref 135–145)
Total Protein: 5.6 g/dL — ABNORMAL LOW (ref 6.5–8.1)
Total Protein: 5 g/dL — ABNORMAL LOW (ref 6.5–8.1)

## 2015-03-05 LAB — RAPID URINE DRUG SCREEN, HOSP PERFORMED
Amphetamines: POSITIVE — AB
Barbiturates: NOT DETECTED
Benzodiazepines: POSITIVE — AB
COCAINE: NOT DETECTED
OPIATES: NOT DETECTED
TETRAHYDROCANNABINOL: NOT DETECTED

## 2015-03-05 LAB — CBC
HCT: 35 % — ABNORMAL LOW (ref 36.0–46.0)
HEMOGLOBIN: 11.1 g/dL — AB (ref 12.0–15.0)
MCH: 34 pg (ref 26.0–34.0)
MCHC: 31.7 g/dL (ref 30.0–36.0)
MCV: 107.4 fL — AB (ref 78.0–100.0)
PLATELETS: 116 10*3/uL — AB (ref 150–400)
RBC: 3.26 MIL/uL — ABNORMAL LOW (ref 3.87–5.11)
RDW: 18.4 % — ABNORMAL HIGH (ref 11.5–15.5)
WBC: 5.9 10*3/uL (ref 4.0–10.5)

## 2015-03-05 LAB — PHOSPHORUS: Phosphorus: 3.1 mg/dL (ref 2.5–4.6)

## 2015-03-05 LAB — ACETAMINOPHEN LEVEL: Acetaminophen (Tylenol), Serum: <10 ug/mL — ABNORMAL LOW (ref 10–30)

## 2015-03-05 LAB — URINALYSIS, ROUTINE W REFLEX MICROSCOPIC
Glucose, UA: NEGATIVE mg/dL
Hgb urine dipstick: NEGATIVE
KETONES UR: 15 mg/dL — AB
NITRITE: POSITIVE — AB
Protein, ur: NEGATIVE mg/dL
SPECIFIC GRAVITY, URINE: 1.023 (ref 1.005–1.030)
UROBILINOGEN UA: 1 mg/dL (ref 0.0–1.0)
pH: 5.5 (ref 5.0–8.0)

## 2015-03-05 LAB — PROTIME-INR
INR: 1.86 — AB (ref 0.00–1.49)
Prothrombin Time: 21.6 seconds — ABNORMAL HIGH (ref 11.6–15.2)

## 2015-03-05 LAB — URINE MICROSCOPIC-ADD ON

## 2015-03-05 LAB — MAGNESIUM: Magnesium: 1.7 mg/dL (ref 1.7–2.4)

## 2015-03-05 LAB — MRSA PCR SCREENING: MRSA by PCR: NEGATIVE

## 2015-03-05 LAB — ETHANOL

## 2015-03-05 MED ORDER — CEFTRIAXONE SODIUM IN DEXTROSE 40 MG/ML IV SOLN
2.0000 g | INTRAVENOUS | Status: DC
  Administered 2015-03-05 – 2015-03-08 (×4): 2 g via INTRAVENOUS
  Filled 2015-03-05 (×5): qty 50

## 2015-03-05 MED ORDER — DEXTROSE 5 % IV SOLN
10.0000 mg | Freq: Once | INTRAVENOUS | Status: AC
  Administered 2015-03-05: 10 mg via INTRAVENOUS
  Filled 2015-03-05: qty 1

## 2015-03-05 MED ORDER — PNEUMOCOCCAL VAC POLYVALENT 25 MCG/0.5ML IJ INJ
0.5000 mL | INJECTION | INTRAMUSCULAR | Status: DC
  Filled 2015-03-05: qty 0.5

## 2015-03-05 MED ORDER — LACTULOSE ENEMA
300.0000 mL | Freq: Every day | ORAL | Status: DC
  Filled 2015-03-05: qty 300

## 2015-03-05 MED ORDER — ONDANSETRON HCL 4 MG PO TABS: 4.0000 mg | ORAL_TABLET | Freq: Four times a day (QID) | ORAL | Status: DC | PRN

## 2015-03-05 MED ORDER — SODIUM CHLORIDE 0.9 % IV SOLN
INTRAVENOUS | Status: AC
  Administered 2015-03-05: 07:00:00 via INTRAVENOUS

## 2015-03-05 MED ORDER — LACTULOSE ENEMA
300.0000 mL | Freq: Once | ORAL | Status: AC
  Administered 2015-03-05: 300 mL via RECTAL
  Filled 2015-03-05: qty 300

## 2015-03-05 MED ORDER — LACTULOSE 10 GM/15ML PO SOLN
30.0000 g | Freq: Three times a day (TID) | ORAL | Status: DC
  Filled 2015-03-05 (×3): qty 45

## 2015-03-05 MED ORDER — SODIUM CHLORIDE 0.9 % IV SOLN
INTRAVENOUS | Status: AC
  Administered 2015-03-05: 04:00:00 via INTRAVENOUS

## 2015-03-05 MED ORDER — RIFAXIMIN 550 MG PO TABS
550.0000 mg | ORAL_TABLET | Freq: Two times a day (BID) | ORAL | Status: DC
  Administered 2015-03-06 – 2015-03-09 (×7): 550 mg via ORAL
  Filled 2015-03-05 (×10): qty 1

## 2015-03-05 MED ORDER — THIAMINE HCL 100 MG/ML IJ SOLN
Freq: Once | INTRAVENOUS | Status: AC
  Administered 2015-03-05: 04:00:00 via INTRAVENOUS
  Filled 2015-03-05: qty 1000

## 2015-03-05 MED ORDER — SODIUM CHLORIDE 0.9 % IV BOLUS (SEPSIS)
1000.0000 mL | Freq: Once | INTRAVENOUS | Status: AC
  Administered 2015-03-05: 1000 mL via INTRAVENOUS

## 2015-03-05 MED ORDER — ONDANSETRON HCL 4 MG/2ML IJ SOLN
4.0000 mg | Freq: Four times a day (QID) | INTRAMUSCULAR | Status: DC | PRN
  Administered 2015-03-05: 4 mg via INTRAVENOUS
  Filled 2015-03-05: qty 2

## 2015-03-05 MED ORDER — POTASSIUM CHLORIDE 10 MEQ/100ML IV SOLN
10.0000 meq | INTRAVENOUS | Status: AC
  Administered 2015-03-05 (×2): 10 meq via INTRAVENOUS
  Filled 2015-03-05 (×2): qty 100

## 2015-03-05 MED ORDER — LACTULOSE 10 GM/15ML PO SOLN
30.0000 g | Freq: Three times a day (TID) | ORAL | Status: DC
  Filled 2015-03-05 (×2): qty 45

## 2015-03-05 MED ORDER — MORPHINE SULFATE 2 MG/ML IJ SOLN
1.0000 mg | Freq: Once | INTRAMUSCULAR | Status: AC
  Administered 2015-03-05: 1 mg via INTRAVENOUS
  Filled 2015-03-05: qty 1

## 2015-03-05 NOTE — Progress Notes (Signed)
Pt transferred to 3S 03 per bed. Family members with the pt.

## 2015-03-05 NOTE — Progress Notes (Signed)
Pt came via stretcher from ED accompanied by NT and family at 0115. Charge RN received a call from attending MD that pt needs to be admitted at 3S for close monitoring. Attempted to give report to 3S RN but not ready this time. Will call back.

## 2015-03-05 NOTE — H&P (Signed)
Date: 03/05/2015               Patient Name:  [name] [name] MRN: [id number]  DOB: 28-Aug-1961 Age / Sex: 54 y.o., female   PCP: Milagros Loll, MD         Medical Service: Internal Medicine Teaching Service         Attending Physician: Dr. Madilyn Fireman, MD    First Contact: Dr. Arcelia Jew Pager: [phone]  Second Contact: Dr. Heber Peralta Pager: [phone]       After Hours (After 5p/  First Contact Pager: [phone]  weekends / holidays): Second Contact Pager: [phone]   Chief Complaint: AMS  History of Present Illness:   54 yo female with COPD, Hep C cirrhosis, hepatic encephalopathy, hx of cervical cancer, here with AMS. This has been going on for last few days. Patient lives by herself and has been feeling poorly for last 2-3 days. Her daughter checked on her 1 day ago and she was feeling generally poorly. On day of admission, her daughter called and she told her "I am feeling like I am gonna die". Daughter called her later to see if she would like to come to the hospital but she did not respond. Daughter called the police and they broke in her house to get her. She was brought in here by EMS.   Patient has some complaint of suprapubic tenderness and dysuria, malaise, generalized weakness and low energy. No other complaints.     Meds: Current Facility-Administered Medications  Medication Dose Route Frequency Provider Last Rate Last Dose  . 0.9 %  sodium chloride infusion   Intravenous STAT Veryl Speak, MD      . cefTRIAXone (ROCEPHIN) 2 g in dextrose 5 % 50 mL IVPB - Premix  2 g Intravenous Q24H Jerrye Noble, MD      . lactulose [facility]) enema 200 gm  300 mL Rectal Daily Jerrye Noble, MD      . ondansetron [facility]) tablet 4 mg  4 mg Oral Q6H PRN Jerrye Noble, MD       Or  . ondansetron [facility]) injection 4 mg  4 mg Intravenous Q6H PRN Jerrye Noble, MD      . potassium chloride 10 mEq in 100 mL IVPB  10 mEq Intravenous Q1 Hr x 2 Ayomikun Starling, MD      . sodium chloride 0.9 % 1,000 mL  with thiamine 189 mg, folic acid 1 mg, multivitamins adult 10 mL infusion   Intravenous Once Jerrye Noble, MD      . sodium chloride 0.9 % bolus 1,000 mL  1,000 mL Intravenous Once Jerrye Noble, MD       Prescriptions prior to admission  Medication Sig Dispense Refill Last Dose  . albuterol (PROVENTIL) (2.5 MG/3ML) 0.083% nebulizer solution Take 2.5 mg by nebulization 2 (two) times daily.   03/04/2015 at Unknown time  . clonazePAM (KLONOPIN) 1 MG tablet 1  tid 90 tablet 3 03/04/2015 at Unknown time  . COMBIVENT RESPIMAT 20-100 MCG/ACT AERS respimat INHALE 1 PUFF INTO THE LUNGS EVERY 6 (SIX) HOURS AS NEEDED FOR WHEEZING. 4 g 1 03/04/2015 at Unknown time  . fluconazole (DIFLUCAN) 100 MG tablet Take 1 tablet by mouth once per week x 12 weeks. 12 tablet 0 Past Week at Unknown time  . fluticasone (FLONASE) 50 MCG/ACT nasal spray Place 1 spray into both nostrils daily. 9.9 g 2 03/04/2015 at Unknown time  . hydrOXYzine (ATARAX/VISTARIL) 50 MG tablet  1 qhs may repeat 60 tablet 3 03/03/2015 at Unknown time  . [place]-Paritapre-Ritona-Dasab 12.5-75-50 &250 MG TBPK Take by mouth.   03/04/2015 at Unknown time  . polyethylene glycol powder (GLYCOLAX/MIRALAX) powder TAKE 1/2-1 CAPFUL (9-17 G) BY MOUTH ONCE DAILY 527 g 1 Past Week at Unknown time  . PROAIR HFA 108 (90 BASE) MCG/ACT inhaler INHALE TWO   PUFFS INTO THE LUNGS EVERY 6 HOURS AS NEEDED FOR WHEEZING. 8.5 g 6 03/03/2015 at Unknown time  . propranolol (INDERAL) 20 MG tablet TAKE 1 TABLET (20 MG TOTAL) BY MOUTH DAILY. 90 tablet 3 03/04/2015 at 0700  . ranitidine (ZANTAC) 150 MG tablet TAKE 1 CAPSULE (150 MG TOTAL) BY MOUTH TWO   (TWO) TIMES DAILY. 60 tablet 11 03/04/2015 at Unknown time  . SPIRIVA HANDIHALER 18 MCG inhalation capsule PLACE 1 CAPSULE (18 MCG TOTAL) INTO INHALER AND INHALE DAILY. 30 capsule 6 03/04/2015 at Unknown time  . traMADol (ULTRAM) 50 MG tablet Take 1 tablet by mouth every 6 hours as needed for pain 120 tablet 5 Past Week at Unknown time  . traZODone  (DESYREL) 100 MG tablet 2 qhs  And if not sleeping  In 1 week  Increase to 3 qhs 90 tablet 3 03/03/2015 at Unknown time  . furosemide (LASIX) 20 MG tablet TAKE 1 TABLET BY MOUTH TWICE DAILY (Patient not taking: Reported on 01/05/2015) 60 tablet 1 Not Taking at Unknown time  . lactulose (CHRONULAC) 10 GM/15ML solution Take 45 ml (30 grams) by mouth daily (Patient not taking: Reported on 03/04/2015) 1350 mL 1 Not Taking at Unknown time  . mirtazapine (REMERON) 45 MG tablet Take 0.5 tablets (22.5 mg total) by mouth at bedtime. (Patient not taking: Reported on 03/04/2015) 30 tablet 5 Not Taking at Unknown time  . MOVIPREP 100 G SOLR Take 1 kit (200 g total) by mouth once. (Patient not taking: Reported on 01/05/2015) 1 kit 0 Not Taking at Unknown time  . pantoprazole (PROTONIX) 40 MG tablet TAKE 1 TABLET (40 MG TOTAL) BY MOUTH DAILY. (Patient not taking: Reported on 03/04/2015) 90 tablet 1 Not Taking at Unknown time  . Sodium Chloride-Sodium Bicarb (CLASSIC NETI POT SINUS WASH) 2300-700 MG KIT Please use as directed in the package. (Patient not taking: Reported on 01/05/2015) 1 each 0 Not Taking at Unknown time    Allergies: Allergies as of 03/04/2015 - Review Complete 03/04/2015  Allergen Reaction Noted  . Amitriptyline  01/22/2013   Past Medical History  Diagnosis Date  . Alcoholic cirrhosis   . COPD (chronic obstructive pulmonary disease)   . Heart palpitations   . Anxiety   . Portal hypertension   . Esophageal varices   . Cholelithiasis   . Splenomegaly   . Fatty liver   . Esophagitis 2010  . Tubulovillous adenoma of colon   . Visual hallucination     since 09/2010/notes 10/30/2012  . Hepatitis C     chronic hepatitis C and Steatohepatitis (hep grade 2, stage 2-3) per 05/13/08 liver biopsy  . Obesity   . cervical Cancer 1999  . Depression   . Shortness of breath   . Allergy   . Arthritis   . GERD (gastroesophageal reflux disease)   . Hypertension   . Gastritis    Past Surgical History    Procedure Laterality Date  . Cervical cancer surgery    . Abdominal hysterectomy  1999  . Tubal ligation  1982  . Esophagogastroduodenoscopy  10/18/2011    Procedure: ESOPHAGOGASTRODUODENOSCOPY (EGD);  Surgeon:  Owens Loffler, MD;  Location: [facility];  Service: Endoscopy;  Laterality: N/A;   Family History  Problem Relation Age of Onset  . Cervical cancer Sister   . Breast cancer Maternal Aunt   . Heart disease Father 18    died from MI  . Breast cancer Father   . Cervical cancer Daughter   . Colon cancer Paternal Aunt 99   History   Social History  . Marital Status: Legally Separated    Spouse Name: N/A  . Number of Children: N/A  . Years of Education: N/A   Occupational History  . Not on file.   Social History Main Topics  . Smoking status: Former Smoker -- 0.10 packs/day for 40 years    Types: Cigarettes    Quit date: 10/28/2010  . Smokeless tobacco: Never Used  . Alcohol Use: 0.0 oz/week    0 Standard drinks or equivalent per week     Comment: months since last drink  . Drug Use: No  . Sexual Activity: Not on file   Other Topics Concern  . Not on file   Social History Narrative    Review of Systems: Review of Systems  Unable to perform ROS: mental status change    Physical Exam: Blood pressure 121/87, pulse 67, temperature 97.8 F (36.6 C), temperature source Oral, resp. rate 16, height '5\' 3"'  (1.6 m), weight 199 lb 12.8 oz (90.629 kg), SpO2 98 %. Physical Exam  Constitutional:  Severely jaundiced throughout her entire body. Confused, appears uncomfortable.   HENT:  Head: Normocephalic and atraumatic.  Dry oral mucosa  Eyes: Right eye exhibits no discharge. Left eye exhibits no discharge. Scleral icterus is present.  Cardiovascular: Normal rate, regular rhythm and normal heart sounds.  Exam reveals no gallop and no friction rub.   No murmur heard. Respiratory: Effort normal and breath sounds normal. No respiratory distress. She has no wheezes.  She has no rales. She exhibits no tenderness.  GI: Bowel sounds are normal. She exhibits no mass. There is no guarding.  Mildly Distended abdomen, no fluid wave. No tenderness. Has tenderness on suprapubic area.  Musculoskeletal: She exhibits no tenderness.  1+ pitting edema BLE's.  Neurological:  Oriented to self, place, and month. Incoherent, answering inappropriately.   Skin: No rash noted.    Lab results: Basic Metabolic Panel:  Recent Labs  03/04/15 2125 03/04/15 2139 03/05/15 0056  NA 133* 143 141  K 3.4* 3.5 3.2*  CL 107 105 109  CO2 22  --  22  GLUCOSE 152* 148* 147*  BUN '13 17 13  ' CREATININE 1.23* 1.30* 1.23*  CALCIUM 7.6*  --  7.6*   Liver Function Tests:  Recent Labs  03/04/15 2222 03/05/15 0056  AST 68* 62*  ALT 33 33  ALKPHOS 156* 139*  BILITOT 24.0* 20.9*  PROT 5.8* 5.6*  ALBUMIN 2.3* 2.0*   No results for input(s): LIPASE, AMYLASE in the last 72 hours.  Recent Labs  03/04/15 2222  AMMONIA 129*   CBC:  Recent Labs  03/04/15 2125 03/04/15 2139  WBC 6.2  --   HGB 12.3 15.0  HCT 38.9 44.0  MCV 108.1*  --   PLT 139*  --    Cardiac Enzymes: No results for input(s): CKTOTAL, CKMB, CKMBINDEX, TROPONINI in the last 72 hours. BNP: No results for input(s): PROBNP in the last 72 hours. D-Dimer: No results for input(s): DDIMER in the last 72 hours. CBG:  Recent Labs  03/04/15 2124  GLUCAP 122*  Hemoglobin A1C: No results for input(s): HGBA1C in the last 72 hours. Fasting Lipid Panel: No results for input(s): CHOL, HDL, LDLCALC, TRIG, CHOLHDL, LDLDIRECT in the last 72 hours. Thyroid Function Tests: No results for input(s): TSH, T4TOTAL, FREET4, T3FREE, THYROIDAB in the last 72 hours. Anemia Panel: No results for input(s): VITAMINB12, FOLATE, FERRITIN, TIBC, IRON, RETICCTPCT in the last 72 hours. Coagulation:  Recent Labs  03/05/15 0053  LABPROT 21.6*  INR 1.86*   Urine Drug Screen: Drugs of Abuse     Component Value  Date/Time   LABOPIA NONE DETECTED 01/22/2013 1621   LABOPIA NEG 07/22/2012 1527   COCAINSCRNUR NONE DETECTED 01/22/2013 1621   COCAINSCRNUR NEG 07/22/2012 1527   LABBENZ NONE DETECTED 01/22/2013 1621   LABBENZ PPS 07/22/2012 1527   LABBENZ NEG 05/14/2010 2116   AMPHETMU NONE DETECTED 01/22/2013 1621   AMPHETMU NEG 07/22/2012 1527   AMPHETMU NEG 05/14/2010 2116   THCU NONE DETECTED 01/22/2013 1621   THCU NEG 07/22/2012 1527   LABBARB NONE DETECTED 01/22/2013 1621   LABBARB NEG 07/22/2012 1527    Alcohol Level:  Recent Labs  03/05/15 0038  ETH <5   Urinalysis: No results for input(s): COLORURINE, LABSPEC, PHURINE, GLUCOSEU, HGBUR, BILIRUBINUR, KETONESUR, PROTEINUR, UROBILINOGEN, NITRITE, LEUKOCYTESUR in the last 72 hours.  Invalid input(s): APPERANCEUR Misc. Labs:   Imaging results:  No results found.  Other results: EKG: NSR, QTC 509.   Assessment & Plan by Problem: Active Problems:   Hepatic encephalopathy   Altered mental status  54 yo with hx of Hep C Cirrhosis, hx of hepatic encephalopathy here with AMS and severe hyper Bilirubinemia.   Severe Hyper Bilirubinemia -unclear etiology - TBili 24 (last was 0.2 01/2015), Direct 15.6, Indirect 8.4 - acute elevation. ddx includes medications, end stage hepatic disease (al though AST/ALT not elevated significantly and her LFT was normal on 01/2015), obstruction, infection, autoimmune process, acute alcoholic hepatitis (although AST/ALT not elevated), or paraneoplastic syndrome from malignancy causing cholestatics (common from gynecologic malignancy, renal cell carcinoma, etc.).  MELD score 27 (19.6% 3 month mortality). Tylenol and alcohol level negative.  Less likely primary bilirubin accumulation from liver disease as her Tbiliu as 0.2 last in 01/2015. Less likely to he hemolysis with hemoglobin at her baseline and both direct and indirect are elevated.  Could be from sepsis - if septic, likely urinary source as complaining  of dysuria. - checking UA, BCX. Patient is afebrile currently. - will check ultrasound abdomen to evaluate for any obstruction - does have hx of cervical cancer so could have liver mets - has AKI currently, could consider CT abdomen tomorrow after hydrating her.  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 Will hold checking other auto-immune labs for now. - treat supportively for now including IVF hydration. Will consult GI in AM.  AMS - likely 2/2 to hepatic encephalopathy or 2/2 to severely high Bilirubinemia.  Could also be 2/2 to her psych meds in the setting of liver failure leading to drug accumulation. - ammonia was checked by ED 129. Alcohol and tylenol level negative. UDS pending. - will give lactulose enema - folate, multivitamin.  - hold all psych meds/sedatives. - neuro checks. - no CT head for now as no clear head injuries.  - treat for possible UTI has patient has dysuria and suprapubic pain - with ceftriaxone. UA pending.  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  Iron stain was negative 08/2014 u/s with elastography showed F3-F4.  Hep A and Hep B immune per hepatology note.  - she was started on Viekira Pak/Ribavirin for 24 weeks on 11/08/2014 by [place]. - per hepatology note from 12/19/2014, she is not on the transplant list as her MELD score was 11 previously and she is a poor candidate for transplant because of her psychiatric co-morbidites.  - on propanolol for esophageal varices - hold for now as BP is borderline low, may have sepsis.  AKI - b/l ~0.5, now 1.3. Likely 2/2 to low PO intake - give IVF and recheck.  - @ home on Lasix 40m bid - hold for now as has AKI  Anxiety/depression - on remeron 490mQHS, klonopin 29m2mID - hold in the setting of  AMS.  COPD - at home on albuterol, combivent, flonase, proair - hold for now - may resume in later.  GERD - hold protonix in setting of AMS  DNR NPO  Dispo: Disposition is deferred at this time, awaiting improvement of current medical problems. Anticipated discharge in approximately 1-2 day(s).   The patient does have a current PCP (JeMilagros LollD) and [facility].  The patient does have transportation limitations that hinder transportation to clinic appointments.  Signed: TasDellia NimsD 03/05/2015, 2:27 AM

## 2015-03-05 NOTE — Progress Notes (Signed)
Subjective: Patient lying in bed. Minimal, sluggish movement. Patient appears confused. Intermittently responding to questions. Denies any pain.  Objective: Vital signs in last 24 hours: Filed Vitals:   03/04/15 2245 03/05/15 0015 03/05/15 0133 03/05/15 0401  BP: 107/72 101/59 121/87 95/63  Pulse: 65 70 67 66  Temp:   97.8 F (36.6 C) 97.6 F (36.4 C)  TempSrc:   Oral Oral  Resp: 17 19 16 15   Height:   5\' 3"  (1.6 m)   Weight:   90.629 kg (199 lb 12.8 oz)   SpO2: 91% 91% 98% 97%   Weight change:   Intake/Output Summary (Last 24 hours) at 03/05/15 1054 Last data filed at 03/05/15 1660  Gross per 24 hour  Intake 275.83 ml  Output      0 ml  Net 275.83 ml   Physical Exam: General: lying in bed, lethargic, NAD, severely jaundiced throughout body Eyes: Right eye exhibits no discharge. Left eye exhibits no discharge. Scleral icterus present.  Cardio: RRR, nl S1 and S2, no murmurs, rubs, or gallops Pulm: CTAB, nl WOB Abdomen: soft, nontender, mildly distended Extremities: 1+ pitting edema in BLEs.  Neurological: Oriented to self and place. Appears confused.   Lab Results: Basic Metabolic Panel:  Recent Labs Lab 03/05/15 0056 03/05/15 0700  NA 141 140  K 3.2* 3.2*  CL 109 111  CO2 22 20*  GLUCOSE 147* 135*  BUN 13 12  CREATININE 1.23* 1.05*  CALCIUM 7.6* 7.4*  MG  --  1.7  PHOS  --  3.1   Liver Function Tests:  Recent Labs Lab 03/05/15 0056 03/05/15 0700  AST 62* 57*  ALT 33 30  ALKPHOS 139* 136*  BILITOT 20.9* 18.4*  PROT 5.6* 5.0*  ALBUMIN 2.0* 1.9*   CBC:  Recent Labs Lab 03/04/15 2125 03/04/15 2139 03/05/15 0700  WBC 6.2  --  5.9  HGB 12.3 15.0 11.1*  HCT 38.9 44.0 35.0*  MCV 108.1*  --  107.4*  PLT 139*  --  116*   Coagulation:  Recent Labs Lab 03/05/15 0053  LABPROT 21.6*  INR 1.86*   Alcohol Level:  Recent Labs Lab 03/05/15 0038  ETH <5    Micro Results: Recent Results (from the past 240 hour(s))  MRSA PCR Screening      Status: None   Collection Time: 03/05/15  2:44 AM  Result Value Ref Range Status   MRSA by PCR NEGATIVE NEGATIVE Final    Comment:        The GeneXpert MRSA Assay (FDA approved for NASAL specimens only), is one component of a comprehensive MRSA colonization surveillance program. It is not intended to diagnose MRSA infection nor to guide or monitor treatment for MRSA infections.    Studies/Results: US Abdomen Complete  03/05/2015 IMPRESSION: 1. Cirrhosis with portal venous hypertension reflected by splenomegaly and ascites. 2. No document oval flow in the portal vein. It may either be thrombosed or have significantly reduced flow due to portal venous hypertension. 3. Gallstones in gallbladder sludge. Significant wall thickening. The wall thickening is nonspecific in the setting of ascites. There is no positive sonographic Murphy's sign to support acute cholecystitis. 4. No bile duct dilation. There is no evidence of biliary obstruction. Hyperbilirubinemia is likely on the basis of liver dysfunction.   Electronically Signed   By: Lajean Manes M.D.   On: 03/05/2015 09:06   Medications: I have reviewed the patient's current medications. Scheduled Meds: . sodium chloride   Intravenous STAT  . sodium  chloride   Intravenous STAT  . cefTRIAXone (ROCEPHIN)  IV  2 g Intravenous Q24H  . lactulose  300 mL Rectal Daily  . [START ON 03/06/2015] pneumococcal 23 valent vaccine  0.5 mL Intramuscular Tomorrow-1000   Continuous Infusions:  PRN Meds:.ondansetron **OR** ondansetron (ZOFRAN) IV   Assessment/Plan: Active Problems:   Hepatic encephalopathy   Altered mental status   [name] [name] is a 67 yoF with hx of Hepatitis C, alcohol-induced cirrhosis and hepatic encephalopathy here with AMS and acute hyperbilirubinemia, possibly 2/2 to portal vein obstruction.  Acute Hyperbilirubinemia: TBili down from 24 (5/7 at 2222) to 18.4 (5/8 at 0700). TBili of 0.2 in 01/2015. Abdominal ultrasound shows no  flow in portal vein, suggesting possible portal vein thrombosis or significantly reduced flow due to portal venous hypertension. DDx also includes medications, medications, end stage hepatic disease (al though AST/ALT not elevated significantly and her LFT was normal on 01/2015), infection, autoimmune process, acute alcoholic hepatitis (although AST/ALT not elevated), or paraneoplastic syndrome from malignancy causing cholestatics (common from gynecologic malignancy, renal cell carcinoma, etc.). Patient remains afebrile and is no longer complaining of suprapubic tenderness; treating empirically for UTI. MELD score 27 (19.6% 3 month mortality). - GI consulted - begin heparin - daily CMP - f/u U/A - f/u blood cultures - f/u anti-mitochondrial antibody - IVF  AMS: Likely 2/2 to hepatic encephalopathy or 2/2 to severe hyperbilirubinemia. Could also be 2/2 to her psych meds in the setting of liver failure leading to drug accumulation. Ammonia 129. Alcohol and tylenol level negative. - empiric ceftriaxone for possible UTI given dysuria and suprapubic pain on presentation - f/u U/A - f/u UDS - lactulose enema  - hold all psych meds/sedatives - neuro checks - SLP evaluation  Hx of Genotype 1 Hep C and Alcohol-Induced Cirrhosis w/ Esophageal Varices: Liver bx 04/2008 showed grade 2 stage II-III disease, mixed micro and macro steatosis consistent with serohepatitis in addition to Hep C.She was started on Viekira Pak/Ribavirin for 24 weeks on 11/08/2014 by [place]. Per hepatology note from 12/19/2014, she is not on the transplant list as her MELD score was 11 previously and she is a poor candidate for transplant because of her psychiatric co-morbidites.  - hold propanolol given borderline hypotension - pneumococcal 23 vaccine  AKI: Baseline SCr ~0.5, now 1.05, down from 1.3 yesterday. Likely 2/2 to low PO intake. - daily CMP - hold home lasix  Anxiety/depression - On remeron 45mg  QHS, klonopin  1mg  TID at home. -Hold home psychiatric medications setting of AMS.  COPD: No acute exacerbation. - hold home albuterol, combivent, flonase, proair  GERD  - hold protonix in setting of AMS  FEN/GI: Diet: NPO DVT PPx: SCDs PPI: none  Dispo: Disposition is deferred at this time, awaiting improvement of current medical problems. Anticipated discharge in approximately 4-5 day(s).   The patient does have a current PCP Milagros Loll, MD) and does need [facility] follow-up appointment after discharge.  The patient does have transportation limitations that hinder transportation to clinic appointments.  This is a Careers information officer Note.  The care of the patient was discussed with Dr. Joni Reining and the assessment and plan formulated with their assistance.  Please see their attached note for official documentation of the daily encounter.   LOS: 0 days   Reuben Likes, Med Student 03/05/2015, 10:54 AM

## 2015-03-05 NOTE — Consult Note (Signed)
Referring Provider: [facility] Internal Medicine Residency Primary Care Physician:  Jacques Earthly, MD Primary Gastroenterologist:  Dr. Olevia Perches  Reason for Consultation:  Cirrhosis, AMS, hyperbilirubinemia      HPI: [name] [name] is a 54 y.o. female  with a history of chronic hepatitis C, cirrhosis, hepatic encephalopathy, cervical cancer, and COPD. She followed at [facility] and is followed by Dr. Edwyna Ready. She had declined treatment for hepatitis C in the past due to fear of side effects but reportedly started [place] pak and ribavirin for 24 weeks in January 2016. She had a liver biopsy in July 2009 that showed grade 2-3 disease with mixed micro and macro steatosis consistent with steatohepatitis in addition to her genotype 1 hepatitis C. Her iron stain was negative. At her 01/05/2015 visit [facility] it was felt that with a meld score of 11 she was not sufficiently decompensated to be considered for a transplant and her psychiatric comorbidities need her a poor candidate for transplant. She had an abdominal ultrasound in November 2015 with the last few which showed multiple gallstones. Her gallbladder wall was 5 mm. Liver echotexture was consistent with cirrhosis. An upper endoscopy in December 2012 showed esophageal varices. She also has a history of major depressive disorder and anxiety followed by Dr. Casimiro Needle.  Patient was admitted through the emergency room earlier this morning . She lives by herself and her daughter reported that she had been feeling poorly for several days.the daughter spoke to her yesterday and the patient told her daughter she felt like she was going to die. The daughter called her later to see if she was interested in going to the hospital and the patient did not respond. The daughter subsequently called the police who entered the patient's house and had the patient brought to the hospital by EMS. Patient reports she had been having some abdominal pain. She denies nausea or vomiting. She  has felt very weak.    Past Medical History  Diagnosis Date  . Alcoholic cirrhosis   . COPD (chronic obstructive pulmonary disease)   . Heart palpitations   . Anxiety   . Portal hypertension   . Esophageal varices   . Cholelithiasis   . Splenomegaly   . Fatty liver   . Esophagitis 2010  . Tubulovillous adenoma of colon   . Visual hallucination     since 09/2010/notes 10/30/2012  . Hepatitis C     chronic hepatitis C and Steatohepatitis (hep grade 2, stage 2-3) per 05/13/08 liver biopsy  . Obesity   . cervical Cancer 1999  . Depression   . Shortness of breath   . Allergy   . Arthritis   . GERD (gastroesophageal reflux disease)   . Hypertension   . Gastritis     Past Surgical History  Procedure Laterality Date  . Cervical cancer surgery    . Abdominal hysterectomy  1999  . Tubal ligation  1982  . Esophagogastroduodenoscopy  10/18/2011    Procedure: ESOPHAGOGASTRODUODENOSCOPY (EGD);  Surgeon: Owens Loffler, MD;  Location: [facility];  Service: Endoscopy;  Laterality: N/A;    Prior to Admission medications   Medication Sig Start Date End Date Taking? Authorizing Provider  albuterol (PROVENTIL) (2.5 MG/3ML) 0.083% nebulizer solution Take 2.5 mg by nebulization 2 (two) times daily. 06/03/13  Yes Blain Pais, MD  clonazePAM (KLONOPIN) 1 MG tablet 1  tid 02/08/15  Yes Norma Fredrickson, MD  COMBIVENT RESPIMAT 20-100 MCG/ACT AERS respimat INHALE 1 PUFF INTO THE LUNGS EVERY 6 (SIX) HOURS  AS NEEDED FOR WHEEZING. 02/28/15  Yes Milagros Loll, MD  fluconazole (DIFLUCAN) 100 MG tablet Take 1 tablet by mouth once per week x 12 weeks. 11/01/14  Yes Lafayette Dragon, MD  fluticasone (FLONASE) 50 MCG/ACT nasal spray Place 1 spray into both nostrils daily. 01/05/15 01/05/16 Yes Otho Bellows, MD  hydrOXYzine (ATARAX/VISTARIL) 50 MG tablet 1 qhs may repeat 11/09/14  Yes Norma Fredrickson, MD  Ombitas-Paritapre-Ritona-Dasab 12.5-75-50 &250 MG TBPK Take by mouth.   Yes Historical Provider, MD    polyethylene glycol powder (GLYCOLAX/MIRALAX) powder TAKE 1/2-1 CAPFUL (9-17 G) BY MOUTH ONCE DAILY 01/18/15  Yes Lafayette Dragon, MD  PROAIR HFA 108 (90 BASE) MCG/ACT inhaler INHALE TWO   PUFFS INTO THE LUNGS EVERY 6 HOURS AS NEEDED FOR WHEEZING. 11/10/14  Yes Otho Bellows, MD  propranolol (INDERAL) 20 MG tablet TAKE 1 TABLET (20 MG TOTAL) BY MOUTH DAILY. 01/02/15  Yes Otho Bellows, MD  ranitidine (ZANTAC) 150 MG tablet TAKE 1 CAPSULE (150 MG TOTAL) BY MOUTH TWO   (TWO) TIMES DAILY. 07/06/14  Yes Otho Bellows, MD  SPIRIVA HANDIHALER 18 MCG inhalation capsule PLACE 1 CAPSULE (18 MCG TOTAL) INTO INHALER AND INHALE DAILY. 01/18/15  Yes Otho Bellows, MD  traMADol Veatrice Bourbon) 50 MG tablet Take 1 tablet by mouth every 6 hours as needed for pain 11/10/14  Yes Otho Bellows, MD  traZODone (DESYREL) 100 MG tablet 2 qhs  And if not sleeping  In 1 week  Increase to 3 qhs 02/08/15  Yes Norma Fredrickson, MD  furosemide (LASIX) 20 MG tablet TAKE 1 TABLET BY MOUTH TWICE DAILY Patient not taking: Reported on 01/05/2015 11/01/14   Otho Bellows, MD  lactulose [facility]) 10 GM/15ML solution Take 45 ml (30 grams) by mouth daily Patient not taking: Reported on 03/04/2015 01/18/15   Lafayette Dragon, MD  mirtazapine (REMERON) 45 MG tablet Take 0.5 tablets (22.5 mg total) by mouth at bedtime. Patient not taking: Reported on 03/04/2015 02/08/15   Norma Fredrickson, MD  MOVIPREP 100 G SOLR Take 1 kit (200 g total) by mouth once. Patient not taking: Reported on 01/05/2015 11/01/14   Lafayette Dragon, MD  pantoprazole (PROTONIX) 40 MG tablet TAKE 1 TABLET (40 MG TOTAL) BY MOUTH DAILY. Patient not taking: Reported on 03/04/2015 01/02/15   Otho Bellows, MD  Sodium Chloride-Sodium Bicarb (CLASSIC NETI POT SINUS [place]) 2300-700 MG KIT Please use as directed in the package. Patient not taking: Reported on 01/05/2015 12/09/14   Riccardo Dubin, MD    Current Facility-Administered Medications  Medication Dose Route Frequency Provider Last Rate Last  Dose  . 0.9 %  sodium chloride infusion   Intravenous STAT Veryl Speak, MD 50 mL/hr at 03/05/15 0354    . 0.9 %  sodium chloride infusion   Intravenous STAT Jerrye Noble, MD 125 mL/hr at 03/05/15 [id number]    . cefTRIAXone (ROCEPHIN) 2 g in dextrose 5 % 50 mL IVPB - Premix  2 g Intravenous Q24H Jerrye Noble, MD   2 g at 03/05/15 0419  . lactulose (CHRONULAC) enema 200 gm  300 mL Rectal Daily Madilyn Fireman, MD   Stopped at 03/05/15 0556  . ondansetron (ZOFRAN) tablet 4 mg  4 mg Oral Q6H PRN Jerrye Noble, MD       Or  . ondansetron [facility]) injection 4 mg  4 mg Intravenous Q6H PRN Jerrye Noble, MD      . Derrill Memo ON 03/06/2015]  pneumococcal 23 valent vaccine (PNU-IMMUNE) injection 0.5 mL  0.5 mL Intramuscular Tomorrow-1000 Madilyn Fireman, MD        Allergies as of 03/04/2015 - Review Complete 03/04/2015  Allergen Reaction Noted  . Amitriptyline  01/22/2013    Family History  Problem Relation Age of Onset  . Cervical cancer Sister   . Breast cancer Maternal Aunt   . Heart disease Father 66    died from MI  . Breast cancer Father   . Cervical cancer Daughter   . Colon cancer Paternal Aunt 25    History   Social History  . Marital Status: Legally Separated    Spouse Name: N/A  . Number of Children: N/A  . Years of Education: N/A   Occupational History  . Not on file.   Social History Main Topics  . Smoking status: Current Every Day Smoker -- 0.10 packs/day for 40 years    Types: Cigarettes    Last Attempt to Quit: 10/28/2010  . Smokeless tobacco: Never Used  . Alcohol Use: 0.0 oz/week    0 Standard drinks or equivalent per week     Comment: months since last drink  . Drug Use: No  . Sexual Activity: Not on file   Other Topics Concern  . Not on file   Social History Narrative    Review of Systems: Unable to assess. Patient oriented only to self. Patient very sleepy and requires repeated tactile stimulation on her chest wall to open her eyes and slowly answer the question  if she answers it at all.  Physical Exam: Vital signs in last 24 hours: Temp:  [97.6 F (36.4 C)-98.1 F (36.7 C)] 97.6 F (36.4 C) (05/08 0401) Pulse Rate:  [65-71] 66 (05/08 0401) Resp:  [15-19] 15 (05/08 0401) BP: (95-121)/(58-87) 95/63 mmHg (05/08 0401) SpO2:  [90 %-98 %] 97 % (05/08 0401) Weight:  [193 lb (87.544 kg)-199 lb 12.8 oz (90.629 kg)] 199 lb 12.8 oz (90.629 kg) (05/08 0133)   General:   Jaundiced, lethargic Head:  Normocephalic and atraumatic. Eyes:  Sclera anicteric,  Conjunctiva pink. Ears:  Normal auditory acuity. Nose:  No deformity, discharge,  or lesions. Mouth:  No deformity or lesions.   Neck:  Supple; no masses or thyromegaly. Lungs:  Clear throughout to auscultation.    Heart:  Regular rate and rhythm; no murmurs, clicks, rubs,  or gallops. Abdomen:  Soft,mild diffuse tenderness,  BS active,nonpalp mass or hsm.   Rectal:  Deferred  Msk:  Symmetrical without gross deformities. . Pulses:  Normal pulses noted. Extremities:1+ edema Neurologic: Lethargic, oriented only to person Skin: Intact without significant lesions or rashes..   Intake/Output from previous day: 05/07 0701 - 05/08 0700 In: 275.8 [I.V.:25.8; IV Piggyback:250] Out: 0  Intake/Output this shift:    Lab Results:  Recent Labs  03/04/15 2125 03/04/15 2139 03/05/15 0700  WBC 6.2  --  5.9  HGB 12.3 15.0 11.1*  HCT 38.9 44.0 35.0*  PLT 139*  --  116*   BMET  Recent Labs  03/04/15 2125 03/04/15 2139 03/05/15 0056 03/05/15 0700  NA 133* 143 141 140  K 3.4* 3.5 3.2* 3.2*  CL 107 105 109 111  CO2 22  --  22 20*  GLUCOSE 152* 148* 147* 135*  BUN _0 CREATININE 1.23* 1.30* 1.23* 1.05*  CALCIUM 7.6*  --  7.6* 7.4*   LFT  Recent Labs  03/04/15 2222  03/05/15 0700  PROT 5.8*  < > 5.0*  ALBUMIN 2.3*  < > 1.9*  AST 68*  < > 57*  ALT 33  < > 30  ALKPHOS 156*  < > 136*  BILITOT 24.0*  < > 18.4*  BILIDIR 15.6*  --   --   IBILI 8.4*  --   --   < > = values in  this interval not displayed. PT/INR  Recent Labs  03/05/15 0053  LABPROT 21.6*  INR 1.86*   Hepatitis Panel  Recent Labs  03/05/15 0053  HEPBSAG NEGATIVE  HCVAB Reactive*  HEPAIGM NON REACTIVE  HEPBIGM NON REACTIVE      Studies/Results: US Abdomen Complete  03/05/2015   CLINICAL DATA:  Hyperbilirubinemia. History of hepatitis-C and cirrhosis.  EXAM: ULTRASOUND ABDOMEN COMPLETE  COMPARISON:  Ultrasound, 09/06/2014  FINDINGS: Gallbladder: Mildly distended. There is sludge in gallstones. Significant wall thickening measuring 1 cm. No tenderness to transducer pressure over the gallbladder.  Common bile duct: Diameter: 5.2 mm  Liver: Coarsened liver echotexture with surface nodularity. No discrete mass or lesion. No convincing portal vein flow.  IVC: No abnormality visualized.  Pancreas: Visualized portion unremarkable.  Spleen: Enlarged measuring 15 cm in greatest dimension with a volume of 500 mL. Evidence of a perisplenic varix.  Right Kidney: Length: 11.2 cm. Echogenicity within normal limits. No mass or hydronephrosis visualized.  Left Kidney: Length: 11.3 cm. Echogenicity within normal limits. No mass or hydronephrosis visualized.  Abdominal aorta: No aneurysm visualized.  Other findings: Ascites, small in overall amount, collecting adjacent to the liver and in the lower quadrants of the abdomen.  IMPRESSION: 1. Cirrhosis with portal venous hypertension reflected by splenomegaly and ascites. 2. No document oval flow in the portal vein. It may either be thrombosed or have significantly reduced flow due to portal venous hypertension. 3. Gallstones in gallbladder sludge. Significant wall thickening. The wall thickening is nonspecific in the setting of ascites. There is no positive sonographic Murphy's sign to support acute cholecystitis. 4. No bile duct dilation. There is no evidence of biliary obstruction. Hyperbilirubinemia is likely on the basis of liver dysfunction.   Electronically Signed    By: Lajean Manes M.D.   On: 03/05/2015 09:06    IMPRESSION/PLAN:  54 year old female with hepatitis C, cirrhosis, and hepatic encephalopathy admitted with acute hyperbilirubinemia and altered mental status. Abdominal ultrasound shows decreased flow in portal vein suggesting possible portal vein thrombosis or reduced flow.   AMS likely secondary to hepatic encephalopathy. Urinalysis pending inpatient on ceftriaxone for possible UTI due to dysuria and suprapubic pain. Would check chest x-ray as well. Will order a paracentesis with fluid to be sent for cell count, culture, and Gram stain.   Ammonia elevated at 129. Lactulose ordered, but patient nothing by mouth. Recommend NG tube with lactulose 3 times a day via NG tube.   Coagulopathy with INR 1.86 likely secondary to liver disease. We'll give vitamin K 10 mg IV daily for 3 days.   Utrasound today with no bile duct dilatation and no evidence of biliary obstruction, so we will hold off on MRI/MRCP as hyperbilirubinemia likely due to liver dysfunction. Will review with attending as to further recommendations.  Hvozdovic, Deloris Ping 03/05/2015,  Pager [phone]   ________________________________________________________________________  Velora Heckler GI MD note:  I personally examined the patient, reviewed the data and agree with the assessment and plan described above.  Decompensation of her liver disease (signficant bilirubin, rise; encephalopathy).  We need to search for reversible cause (usually infection); urinalysis, CXR, diagnostic paracentesis.  We've ordered  the para, CXR and she was already written for UA but it has not been collected yet.  [place] argues against biliary issues. She does have known gallstones and perhaps those are causing her acute illness; HIDA would be useless given TBili elevation and [place] does not clearly support acute cholecystitis.  Would continue broad spect abx for now.  NG tube to deliver lactulose and we'll start xifaxan as well.   Vit K to reverse coagulopathy if possible.  Follow liver tests and her clinical status.  Will follow along.   Owens Loffler, MD [facility] Gastroenterology Pager [phone]

## 2015-03-05 NOTE — Progress Notes (Signed)
Report taken from ED RN Junie Panning. Room ready for admission.

## 2015-03-05 NOTE — Progress Notes (Signed)
SLP Cancellation Note  Patient Details Name: [name] [name] MRN: [id number] DOB: 1961-10-06   Cancelled treatment:       Reason Eval/Treat Not Completed: Fatigue/lethargy limiting ability to participate.  ST will f/u next date to attempt BSE.     Shelly Flatten N 03/05/2015, 1:16 PM

## 2015-03-05 NOTE — Progress Notes (Signed)
CRITICAL VALUE ALERT  Critical value received: Total Bilirubin - 20.9  Date of notification:  03/05/15  Time of notification:  0144  Critical value read back:Yes.    Nurse who received alert:  A. Mitali Shenefield, RN  Pt is about to be transferred to 3S. Critical value relayed to 3S RN Kyung Rudd.

## 2015-03-06 ENCOUNTER — Other Ambulatory Visit (HOSPITAL_COMMUNITY): Payer: Self-pay

## 2015-03-06 ENCOUNTER — Inpatient Hospital Stay (HOSPITAL_COMMUNITY): Payer: Medicaid Other

## 2015-03-06 DIAGNOSIS — G9340 Encephalopathy, unspecified: Secondary | ICD-10-CM | POA: Diagnosis present

## 2015-03-06 DIAGNOSIS — K7460 Unspecified cirrhosis of liver: Secondary | ICD-10-CM

## 2015-03-06 DIAGNOSIS — B182 Chronic viral hepatitis C: Secondary | ICD-10-CM

## 2015-03-06 DIAGNOSIS — I8510 Secondary esophageal varices without bleeding: Secondary | ICD-10-CM

## 2015-03-06 LAB — COMPREHENSIVE METABOLIC PANEL
ALT: 28 U/L (ref 14–54)
AST: 60 U/L — ABNORMAL HIGH (ref 15–41)
Albumin: 1.9 g/dL — ABNORMAL LOW (ref 3.5–5.0)
Alkaline Phosphatase: 118 U/L (ref 38–126)
Anion gap: 7 (ref 5–15)
BUN: 10 mg/dL (ref 6–20)
CALCIUM: 7.8 mg/dL — AB (ref 8.9–10.3)
CO2: 21 mmol/L — ABNORMAL LOW (ref 22–32)
Chloride: 114 mmol/L — ABNORMAL HIGH (ref 101–111)
Creatinine, Ser: 1 mg/dL (ref 0.44–1.00)
GFR calc non Af Amer: >60 mL/min (ref >60)
Glucose, Bld: 76 mg/dL (ref 70–99)
Potassium: 3.6 mmol/L (ref 3.5–5.1)
SODIUM: 142 mmol/L (ref 135–145)
TOTAL PROTEIN: 5 g/dL — AB (ref 6.5–8.1)
Total Bilirubin: 16.9 mg/dL — ABNORMAL HIGH (ref 0.3–1.2)

## 2015-03-06 LAB — BODY FLUID CELL COUNT WITH DIFFERENTIAL
Lymphs, Fluid: 36 %
MONOCYTE-MACROPHAGE-SEROUS FLUID: 61 % (ref 50–90)
Neutrophil Count, Fluid: 3 % (ref 0–25)
WBC FLUID: 85 uL (ref 0–1000)

## 2015-03-06 LAB — PROTIME-INR
INR: 1.9 — AB (ref 0.00–1.49)
Prothrombin Time: 22 seconds — ABNORMAL HIGH (ref 11.6–15.2)

## 2015-03-06 LAB — CBC
HCT: 35.5 % — ABNORMAL LOW (ref 36.0–46.0)
Hemoglobin: 11.2 g/dL — ABNORMAL LOW (ref 12.0–15.0)
MCH: 33.5 pg (ref 26.0–34.0)
MCHC: 31.5 g/dL (ref 30.0–36.0)
MCV: 106.3 fL — ABNORMAL HIGH (ref 78.0–100.0)
Platelets: 118 10*3/uL — ABNORMAL LOW (ref 150–400)
RBC: 3.34 MIL/uL — ABNORMAL LOW (ref 3.87–5.11)
RDW: 18.4 % — AB (ref 11.5–15.5)
WBC: 5.4 10*3/uL (ref 4.0–10.5)

## 2015-03-06 LAB — HCV RNA QUANT: HCV Quantitative: NOT DETECTED IU/mL — ABNORMAL LOW (ref <15)

## 2015-03-06 MED ORDER — OMBITAS-PARITAPRE-RITONA-DASAB 12.5-75-50 &250 MG PO TBPK: 1.0000 | ORAL_TABLET | Freq: Every day | ORAL | Status: DC

## 2015-03-06 MED ORDER — LACTULOSE 10 GM/15ML PO SOLN
30.0000 g | Freq: Three times a day (TID) | ORAL | Status: DC
  Administered 2015-03-06 (×3): 30 g via ORAL
  Filled 2015-03-06 (×6): qty 45

## 2015-03-06 MED ORDER — LACTULOSE 10 GM/15ML PO SOLN: 30.0000 g | Freq: Three times a day (TID) | ORAL | Status: DC

## 2015-03-06 MED ORDER — PROPRANOLOL HCL 20 MG PO TABS
20.0000 mg | ORAL_TABLET | Freq: Every day | ORAL | Status: DC
  Administered 2015-03-07 – 2015-03-09 (×2): 20 mg via ORAL
  Filled 2015-03-06 (×4): qty 1

## 2015-03-06 MED ORDER — OMBITAS-PARITAPRE-RITONA-DASAB 12.5-75-50 &250 MG PO TBPK: 3.0000 | ORAL_TABLET | Freq: Every day | ORAL | Status: DC

## 2015-03-06 MED ORDER — SODIUM CHLORIDE 0.9 % IV SOLN
INTRAVENOUS | Status: AC
  Administered 2015-03-06: 12:00:00 via INTRAVENOUS

## 2015-03-06 MED ORDER — OMBITAS-PARITAPRE-RITONA-DASAB 12.5-75-50 &250 MG PO TBPK
1.0000 | ORAL_TABLET | Freq: Every day | ORAL | Status: DC
Start: 2015-03-06 — End: 2015-03-06

## 2015-03-06 MED ORDER — LACTULOSE ENEMA
300.0000 mL | Freq: Once | ORAL | Status: DC
  Filled 2015-03-06: qty 300

## 2015-03-06 MED ORDER — OMBITAS-PARITAPRE-RITONA-DASAB 12.5-75-50 &250 MG PO TBPK
3.0000 | ORAL_TABLET | Freq: Every day | ORAL | Status: DC
  Administered 2015-03-07: 3 via ORAL

## 2015-03-06 MED ORDER — RIBAVIRIN 200 MG PO TABS
600.0000 mg | ORAL_TABLET | Freq: Two times a day (BID) | ORAL | Status: DC
  Administered 2015-03-07: 600 mg via ORAL

## 2015-03-06 MED ORDER — LIDOCAINE HCL (PF) 1 % IJ SOLN
INTRAMUSCULAR | Status: AC
  Filled 2015-03-06: qty 10

## 2015-03-06 NOTE — Procedures (Signed)
Successful US guided paracentesis from RUQ.  Yielded 1.2 liters of clear yellow fluid.  No immediate complications.  Pt tolerated well.   Specimen was sent for labs.  Tsosie Billing D PA-C 03/06/2015 11:35 AM

## 2015-03-06 NOTE — Progress Notes (Signed)
Subjective: No acute events overnight. Patient appears less confused and more oriented. Denies any pain, chest pain, or SOB.  Objective: Vital signs in last 24 hours: Filed Vitals:   03/06/15 0430 03/06/15 0757 03/06/15 0839 03/06/15 1100  BP: 94/50 94/61 107/59 100/61  Pulse: 73 71    Temp: 98.8 F (37.1 C) 98.9 F (37.2 C)    TempSrc: Axillary Oral    Resp: 14 17    Height:      Weight:      SpO2: 93% 94%     Weight change:   Intake/Output Summary (Last 24 hours) at 03/06/15 1146 Last data filed at 03/06/15 0550  Gross per 24 hour  Intake 1472.92 ml  Output   1175 ml  Net 297.92 ml   Physical Exam: General: lying in bed, lethargic, NAD, jaundiced throughout body Eyes: Scleral icterus present.  Cardio: RRR, nl S1 and S2, no murmurs, rubs, or gallops Pulm: CTAB, nl WOB Abdomen: soft, nontender, mildly distended Extremities: 1+ pitting edema in BLEs.  Neurological: Oriented to self, place, city.  Lab Results: Basic Metabolic Panel:  Recent Labs Lab 03/05/15 0700 03/06/15 0245  NA 140 142  K 3.2* 3.6  CL 111 114*  CO2 20* 21*  GLUCOSE 135* 76  BUN 12 10  CREATININE 1.05* 1.00  CALCIUM 7.4* 7.8*  MG 1.7  --   PHOS 3.1  --    Liver Function Tests:  Recent Labs Lab 03/05/15 0700 03/06/15 0245  AST 57* 60*  ALT 30 28  ALKPHOS 136* 118  BILITOT 18.4* 16.9*  PROT 5.0* 5.0*  ALBUMIN 1.9* 1.9*   CBC:  Recent Labs Lab 03/05/15 0700 03/06/15 0245  WBC 5.9 5.4  HGB 11.1* 11.2*  HCT 35.0* 35.5*  MCV 107.4* 106.3*  PLT 116* 118*   Coagulation:  Recent Labs Lab 03/05/15 0053 03/06/15 0245  LABPROT 21.6* 22.0*  INR 1.86* 1.90*   Urine Drug Screen: Drugs of Abuse     Component Value Date/Time   LABOPIA NONE DETECTED 03/05/2015 1830   LABOPIA NEG 07/22/2012 1527   COCAINSCRNUR NONE DETECTED 03/05/2015 1830   COCAINSCRNUR NEG 07/22/2012 1527   LABBENZ POSITIVE* 03/05/2015 1830   LABBENZ PPS 07/22/2012 1527   LABBENZ NEG 05/14/2010 2116     AMPHETMU POSITIVE* 03/05/2015 1830   AMPHETMU NEG 07/22/2012 1527   AMPHETMU NEG 05/14/2010 2116   THCU NONE DETECTED 03/05/2015 1830   THCU NEG 07/22/2012 1527   LABBARB NONE DETECTED 03/05/2015 1830   LABBARB NEG 07/22/2012 1527    Urinalysis:  Recent Labs Lab 03/05/15 1830  COLORURINE RED*  LABSPEC 1.023  PHURINE 5.5  GLUCOSEU NEGATIVE  HGBUR NEGATIVE  BILIRUBINUR LARGE*  KETONESUR 15*  PROTEINUR NEGATIVE  UROBILINOGEN 1.0  NITRITE POSITIVE*  LEUKOCYTESUR SMALL*   Micro Results: Recent Results (from the past 240 hour(s))  Culture, blood (routine x 2)     Status: None (Preliminary result)   Collection Time: 03/05/15 12:30 AM  Result Value Ref Range Status   Specimen Description BLOOD LEFT ANTECUBITAL  Final   Special Requests BOTTLES DRAWN AEROBIC AND ANAEROBIC 10CC EA  Final   Culture   Final           BLOOD CULTURE RECEIVED NO GROWTH TO DATE CULTURE WILL BE HELD FOR 5 DAYS BEFORE ISSUING A FINAL NEGATIVE REPORT Performed at [facility]    Report Status PENDING  Incomplete  Culture, blood (routine x 2)     Status: None (Preliminary result)  Collection Time: 03/05/15 12:38 AM  Result Value Ref Range Status   Specimen Description BLOOD RIGHT HAND  Final   Special Requests BOTTLES DRAWN AEROBIC AND ANAEROBIC 5CC EA  Final   Culture   Final           BLOOD CULTURE RECEIVED NO GROWTH TO DATE CULTURE WILL BE HELD FOR 5 DAYS BEFORE ISSUING A FINAL NEGATIVE REPORT Performed at [facility]    Report Status PENDING  Incomplete  MRSA PCR Screening     Status: None   Collection Time: 03/05/15  2:44 AM  Result Value Ref Range Status   MRSA by PCR NEGATIVE NEGATIVE Final    Comment:        The GeneXpert MRSA Assay (FDA approved for NASAL specimens only), is one component of a comprehensive MRSA colonization surveillance program. It is not intended to diagnose MRSA infection nor to guide or monitor treatment for MRSA infections.     Studies/Results: No new studies.  Medications: I have reviewed the patient's current medications. Scheduled Meds: . cefTRIAXone (ROCEPHIN)  IV  2 g Intravenous Q24H  . lactulose  30 g Oral TID  . Ombitas-Paritapre-Ritona-Dasab  1 tablet Oral QHS  . [START ON 03/07/2015] Ombitas-Paritapre-Ritona-Dasab  3 tablet Oral Daily  . pneumococcal 23 valent vaccine  0.5 mL Intramuscular Tomorrow-1000  . propranolol  20 mg Oral Daily  . rifaximin  550 mg Oral BID   Continuous Infusions: . sodium chloride     PRN Meds:.ondansetron **OR** ondansetron (ZOFRAN) IV   Assessment/Plan: Principal Problem:   Hepatic encephalopathy Active Problems:   Chronic hepatitis C with cirrhosis   Esophageal varices in cirrhosis   Altered mental status   UTI (urinary tract infection)   Hyperbilirubinemia   [name] [name] is a 45 yoF with hx of Hepatitis C, alcohol-induced cirrhosis and hepatic encephalopathy here with AMS and acute hyperbilirubinemia, possibly 2/2 to portal vein obstruction.  Acute Hyperbilirubinemia: TBili down from 24 (5/7 at 2222) to 16.9. TBili of 0.2 in 01/2015. Abdominal ultrasound shows no flow in portal vein, suggesting possible portal vein thrombosis or significantly reduced flow due to portal venous hypertension. DDx also includes medications, medications, end stage hepatic disease (al though AST/ALT not elevated significantly and her LFT was normal on 01/2015), infection, autoimmune process, acute alcoholic hepatitis (although AST/ALT not elevated), or paraneoplastic syndrome from malignancy causing cholestatics (common from gynecologic malignancy, renal cell carcinoma, etc.). Patient remains afebrile and is no longer complaining of suprapubic tenderness, however U/A c/w UTI so treating with ceftriaxone. MELD score 27 (19.6% 3 month mortality). - GI consulted: paracentesis today - daily CMP - f/u blood cultures - f/u anti-mitochondrial antibody  Hepatic Encephalopathy: Likely 2/2 to  hepatic encephalopathy or 2/2 to severe hyperbilirubinemia. Could also be 2/2 to her psych meds in the setting of liver failure leading to drug accumulation. Ammonia 129. Alcohol and tylenol level negative. UDS positive for benzodiazapines and methamphetamines - oral lactulose - hold all psych meds/sedatives - neuro checks  Chronic Hepatitis C and Alcohol-Induced Cirrhosis w/ Esophageal Varices: Liver bx 04/2008 showed grade 2 stage II-III disease, mixed micro and macro steatosis consistent with serohepatitis in addition to Hep C. AFP elevated to 54.6 in 2013.She was started on [place] Pak/Ribavirin for 24 weeks on 11/08/2014 by [place]. Per hepatology note from 12/19/2014, she is not on the transplant list as her MELD score was 11 previously and she is a poor candidate for transplant because of her psychiatric co-morbidites. Followed by Dr.  Brodie of [facility]. - consult GI re: MRI to r/o [place] given elevated AFP -- Discussed with Dr. Ardis Hughs who recommended f/u and possible outpatient MR within the next month to r/o [facility] - GI consulted: continue Vit K IV for 2 days, continue Xifaxan - continue home Viekira/Ribaviran - resume Inderal and PPI - will attempt oral lactulose, can hopefully avoid NG tube given esophageal varices  UTI: U/A consistent with UTI. Patient presented with dysuria and suprapubic pain. No longer reports suprapubic pain. - continue ceftriaxone  AKI: Baseline SCr ~0.5, now 1.00, down from 1.05 yesterday. Likely 2/2 to low PO intake. - IVF - daily CMP - hold home lasix  Anxiety/depression - On remeron 45mg  QHS, klonopin 1mg  TID at home. - hold home psychiatric medications setting of AMS.  COPD: No acute exacerbation. - hold home albuterol, combivent, flonase, proair  GERD  - resume PPI  FEN/GI: Diet: Regular DVT PPx: SCDs  Dispo: Disposition is deferred at this time, awaiting improvement of current medical problems. Anticipated discharge in approximately 3-4  day(s).   The patient does have a current PCP Milagros Loll, MD) and does need an [facility] hospital follow-up appointment after discharge.  The patient does have transportation limitations that hinder transportation to clinic appointments.  This is a Careers information officer Note.  The care of the patient was discussed with Dr. Larey Dresser and the assessment and plan formulated with their assistance.  Please see their attached note for official documentation of the daily encounter.   LOS: 1 day   Reuben Likes, Med Student 03/06/2015, 11:46 AM

## 2015-03-06 NOTE — Progress Notes (Signed)
Subjective: Denies any current pain, more oriented. Objective: Vital signs in last 24 hours: Filed Vitals:   03/05/15 1918 03/05/15 2255 03/06/15 0430 03/06/15 0757  BP: 93/55 112/52 94/50 94/61   Pulse: 64 71 73 71  Temp: 98.4 F (36.9 C) 98.3 F (36.8 C) 98.8 F (37.1 C) 98.9 F (37.2 C)  TempSrc: Axillary Axillary Axillary Oral  Resp: 18 18 14 17   Height:      Weight:      SpO2: 95% 94% 93% 94%   Weight change:   Intake/Output Summary (Last 24 hours) at 03/06/15 0849 Last data filed at 03/06/15 0550  Gross per 24 hour  Intake 1472.92 ml  Output   1175 ml  Net 297.92 ml   General: resting in bed, jaundiced HEENT: EOMI, scleral icterus Cardiac: RRR, no rubs, murmurs or gallops Pulm: clear to auscultation bilaterally Abd: soft, nontender, mild distension, BS present Ext: warm and well perfused, no pedal edema Neuro: alert and oriented X2 (self, hospital, not year) Lab Results: Basic Metabolic Panel:  Recent Labs Lab 03/05/15 0700 03/06/15 0245  NA 140 142  K 3.2* 3.6  CL 111 114*  CO2 20* 21*  GLUCOSE 135* 76  BUN 12 10  CREATININE 1.05* 1.00  CALCIUM 7.4* 7.8*  MG 1.7  --   PHOS 3.1  --    Liver Function Tests:  Recent Labs Lab 03/05/15 0700 03/06/15 0245  AST 57* 60*  ALT 30 28  ALKPHOS 136* 118  BILITOT 18.4* 16.9*  PROT 5.0* 5.0*  ALBUMIN 1.9* 1.9*    Recent Labs Lab 03/04/15 2222  AMMONIA 129*   CBC:  Recent Labs Lab 03/05/15 0700 03/06/15 0245  WBC 5.9 5.4  HGB 11.1* 11.2*  HCT 35.0* 35.5*  MCV 107.4* 106.3*  PLT 116* 118*   CBG:  Recent Labs Lab 03/04/15 2124  GLUCAP 122*   Coagulation:  Recent Labs Lab 03/05/15 0053 03/06/15 0245  LABPROT 21.6* 22.0*  INR 1.86* 1.90*   Anemia Panel: No results for input(s): VITAMINB12, FOLATE, FERRITIN, TIBC, IRON, RETICCTPCT in the last 168 hours. Urine Drug Screen: Drugs of Abuse     Component Value Date/Time   LABOPIA NONE DETECTED 03/05/2015 1830   LABOPIA NEG  07/22/2012 1527   COCAINSCRNUR NONE DETECTED 03/05/2015 1830   COCAINSCRNUR NEG 07/22/2012 1527   LABBENZ POSITIVE* 03/05/2015 1830   LABBENZ PPS 07/22/2012 1527   LABBENZ NEG 05/14/2010 2116   AMPHETMU POSITIVE* 03/05/2015 1830   AMPHETMU NEG 07/22/2012 1527   AMPHETMU NEG 05/14/2010 2116   THCU NONE DETECTED 03/05/2015 1830   THCU NEG 07/22/2012 1527   LABBARB NONE DETECTED 03/05/2015 1830   LABBARB NEG 07/22/2012 1527    Alcohol Level:  Recent Labs Lab 03/05/15 0038  ETH <5   Urinalysis:  Recent Labs Lab 03/05/15 1830  COLORURINE RED*  LABSPEC 1.023  PHURINE 5.5  GLUCOSEU NEGATIVE  HGBUR NEGATIVE  BILIRUBINUR LARGE*  KETONESUR 15*  PROTEINUR NEGATIVE  UROBILINOGEN 1.0  NITRITE POSITIVE*  LEUKOCYTESUR SMALL*     Micro Results: Recent Results (from the past 240 hour(s))  Culture, blood (routine x 2)     Status: None (Preliminary result)   Collection Time: 03/05/15 12:30 AM  Result Value Ref Range Status   Specimen Description BLOOD LEFT ANTECUBITAL  Final   Special Requests BOTTLES DRAWN AEROBIC AND ANAEROBIC 10CC EA  Final   Culture   Final           BLOOD CULTURE RECEIVED NO GROWTH  TO DATE CULTURE WILL BE HELD FOR 5 DAYS BEFORE ISSUING A FINAL NEGATIVE REPORT Performed at [facility]    Report Status PENDING  Incomplete  Culture, blood (routine x 2)     Status: None (Preliminary result)   Collection Time: 03/05/15 12:38 AM  Result Value Ref Range Status   Specimen Description BLOOD RIGHT HAND  Final   Special Requests BOTTLES DRAWN AEROBIC AND ANAEROBIC 5CC EA  Final   Culture   Final           BLOOD CULTURE RECEIVED NO GROWTH TO DATE CULTURE WILL BE HELD FOR 5 DAYS BEFORE ISSUING A FINAL NEGATIVE REPORT Performed at [facility]    Report Status PENDING  Incomplete  MRSA PCR Screening     Status: None   Collection Time: 03/05/15  2:44 AM  Result Value Ref Range Status   MRSA by PCR NEGATIVE NEGATIVE Final    Comment:        The  GeneXpert MRSA Assay (FDA approved for NASAL specimens only), is one component of a comprehensive MRSA colonization surveillance program. It is not intended to diagnose MRSA infection nor to guide or monitor treatment for MRSA infections.    Studies/Results: US Abdomen Complete  03/05/2015   CLINICAL DATA:  Hyperbilirubinemia. History of hepatitis-C and cirrhosis.  EXAM: ULTRASOUND ABDOMEN COMPLETE  COMPARISON:  Ultrasound, 09/06/2014  FINDINGS: Gallbladder: Mildly distended. There is sludge in gallstones. Significant wall thickening measuring 1 cm. No tenderness to transducer pressure over the gallbladder.  Common bile duct: Diameter: 5.2 mm  Liver: Coarsened liver echotexture with surface nodularity. No discrete mass or lesion. No convincing portal vein flow.  IVC: No abnormality visualized.  Pancreas: Visualized portion unremarkable.  Spleen: Enlarged measuring 15 cm in greatest dimension with a volume of 500 mL. Evidence of a perisplenic varix.  Right Kidney: Length: 11.2 cm. Echogenicity within normal limits. No mass or hydronephrosis visualized.  Left Kidney: Length: 11.3 cm. Echogenicity within normal limits. No mass or hydronephrosis visualized.  Abdominal aorta: No aneurysm visualized.  Other findings: Ascites, small in overall amount, collecting adjacent to the liver and in the lower quadrants of the abdomen.  IMPRESSION: 1. Cirrhosis with portal venous hypertension reflected by splenomegaly and ascites. 2. No document oval flow in the portal vein. It may either be thrombosed or have significantly reduced flow due to portal venous hypertension. 3. Gallstones in gallbladder sludge. Significant wall thickening. The wall thickening is nonspecific in the setting of ascites. There is no positive sonographic Murphy's sign to support acute cholecystitis. 4. No bile duct dilation. There is no evidence of biliary obstruction. Hyperbilirubinemia is likely on the basis of liver dysfunction.    Electronically Signed   By: Lajean Manes M.D.   On: 03/05/2015 09:06   Dg Chest Port 1v Same Day  03/05/2015   CLINICAL DATA:  Encephalopathy.  EXAM: PORTABLE CHEST - 1 VIEW SAME DAY  COMPARISON:  Chest radiograph 01/23/2014.  FINDINGS: Cardiopericardial silhouette within normal limits. Mediastinal contours normal. Trachea midline. No airspace disease or effusion. Apical lordotic projection.  Monitoring leads project over the chest.  IMPRESSION: No active disease.   Electronically Signed   By: Dereck Ligas M.D.   On: 03/05/2015 15:26   Medications: I have reviewed the patient's current medications. Scheduled Meds: . cefTRIAXone (ROCEPHIN)  IV  2 g Intravenous Q24H  . lactulose  30 g Per Tube TID  . pneumococcal 23 valent vaccine  0.5 mL Intramuscular Tomorrow-1000  .  rifaximin  550 mg Oral BID   Continuous Infusions:  PRN Meds:.ondansetron **OR** ondansetron (ZOFRAN) IV Assessment/Plan:  Hepatic encephalopathy - Ammonia elevated at 129, treated with lactulose enema, some improvement in mental status can now tolerate lactulose oral will titrate to 2-3 soft bowel movements a day   Chronic hepatitis C with cirrhosis - Follows with Dr Olevia Perches of [facility] - Per GI will continue Vit K IV for 2 days, continue Xifaxan - Will continue home Viekira/Ribaviran - Resume Interal and PPI -Will discuss with GI ? Of need for MRI to evaluate for [facility]   Esophageal varices in cirrhosis - Hopefully we can avoid NGT   UTI (urinary tract infection) - Dysuria on admission, UA is consistent with UTI, may be sterile as it was collected after Abx, will continue Ceftriaxone   Hyperbilirubinemia - T Bili up to 24 on admission has trended down to 16 today. Suspect overall cause is infection leading to increased metabolic demand on liver with cirrhosis.  GI is following.   Dispo: Disposition is deferred at this time, awaiting improvement of current medical problems.  Anticipated discharge in approximately 2  day(s).   The patient does have a current PCP Milagros Loll, MD) and does need an [facility] hospital follow-up appointment after discharge.  The patient does not have transportation limitations that hinder transportation to clinic appointments.  .Services Needed at time of discharge: Y = Yes, Blank = No PT:   OT:   RN:   Equipment:   Other:     LOS: 1 day   Lucious Groves, DO 03/06/2015, 8:49 AM

## 2015-03-06 NOTE — Evaluation (Signed)
Clinical/Bedside Swallow Evaluation Patient Details  Name: [name] [name] MRN: [id number] Date of Birth: 1960-11-01  Today's Date: 03/06/2015 Time: SLP Start Time (ACUTE ONLY): 0840 SLP Stop Time (ACUTE ONLY): 0856 SLP Time Calculation (min) (ACUTE ONLY): 16 min  Past Medical History:  Past Medical History  Diagnosis Date  . Alcoholic cirrhosis   . COPD (chronic obstructive pulmonary disease)   . Heart palpitations   . Anxiety   . Portal hypertension   . Esophageal varices   . Cholelithiasis   . Splenomegaly   . Fatty liver   . Esophagitis 2010  . Tubulovillous adenoma of colon   . Visual hallucination     since 09/2010/notes 10/30/2012  . Hepatitis C     chronic hepatitis C and Steatohepatitis (hep grade 2, stage 2-3) per 05/13/08 liver biopsy  . Obesity   . cervical Cancer 1999  . Depression   . Shortness of breath   . Allergy   . Arthritis   . GERD (gastroesophageal reflux disease)   . Hypertension   . Gastritis    Past Surgical History:  Past Surgical History  Procedure Laterality Date  . Cervical cancer surgery    . Abdominal hysterectomy  1999  . Tubal ligation  1982  . Esophagogastroduodenoscopy  10/18/2011    Procedure: ESOPHAGOGASTRODUODENOSCOPY (EGD);  Surgeon: [name] Loffler, MD;  Location: [facility];  Service: Endoscopy;  Laterality: N/A;   HPI:  54 y.o. female with a history of chronic hepatitis C, cirrhosis, hepatic encephalopathy, cervical cancer, GERD, esophageal varices, esophagitis,  and COPD admitted with AMS likely secondary to hepatic encephalopathy. CXR negative.    Assessment / Plan / Recommendation Clinical Impression  Patient presents with a normal oropharyngeal swallow without evidence of aspiration. No SLP f/u indicated at this time. Briefly educated patient regarding swallowing precautions related to GER.     Aspiration Risk  Mild    Diet Recommendation Age appropriate regular solids;Thin   Medication Administration: Whole meds with  liquid Compensations: Slow rate;Small sips/bites    Other  Recommendations Oral Care Recommendations: Oral care BID         Pertinent Vitals/Pain n/a        Swallow Study    General Other Pertinent Information: 54 y.o. female with a history of chronic hepatitis C, cirrhosis, hepatic encephalopathy, cervical cancer, GERD, esophageal varices, esophagitis,  and COPD admitted with AMS likely secondary to hepatic encephalopathy. CXR negative.  Type of Study: Bedside swallow evaluation Previous Swallow Assessment: none Diet Prior to this Study: NPO Temperature Spikes Noted: No Respiratory Status:  (nasal cannula) History of Recent Intubation: No Behavior/Cognition: Alert;Cooperative;Pleasant mood Oral Cavity - Dentition: Missing dentition (missing upper dentures) Self-Feeding Abilities: Able to feed self Patient Positioning: Upright in bed Baseline Vocal Quality: Normal Volitional Cough: Strong Volitional Swallow: Able to elicit    Oral/Motor/Sensory Function Overall Oral Motor/Sensory Function: Appears within functional limits for tasks assessed   Ice Chips Ice chips: Not tested   Thin Liquid Thin Liquid: Within functional limits Presentation: Cup;Self Fed;Straw    Nectar Thick Nectar Thick Liquid: Not tested   Honey Thick Honey Thick Liquid: Not tested   Puree Puree: Within functional limits Presentation: Self Fed;Spoon   Solid   GO   [name] Neidig MA, CCC-SLP [phone]  Solid: Within functional limits Presentation: Self Fed       [name] [name] [name] [name] 03/06/2015,8:58 AM

## 2015-03-06 NOTE — Progress Notes (Signed)
Daily Rounding Note  03/06/2015, 10:27 AM  LOS: 1 day   SUBJECTIVE:       Denies abdominal pain and nausea.  One solid stool so far.  Denies abdominal pain.  No nausea, just poor appetite and po intake.   Passed BS SLP swallow eval.    OBJECTIVE:         Vital signs in last 24 hours:    Temp:  [97.9 F (36.6 C)-98.9 F (37.2 C)] 98.9 F (37.2 C) (05/09 0757) Pulse Rate:  [62-73] 71 (05/09 0757) Resp:  [14-18] 17 (05/09 0757) BP: (93-112)/(50-66) 94/61 mmHg (05/09 0757) SpO2:  [93 %-95 %] 94 % (05/09 0757) Last BM Date: 03/05/15 [facility] Weights   03/04/15 2120 03/05/15 0133  Weight: 193 lb (87.544 kg) 199 lb 12.8 oz (90.629 kg)   General: jaundiced, ill appearing.  Alert, comfortable   Heart: RRR.  No mrg Chest: clear bil.  No labored breathing.  + cough.   Abdomen: soft,  NT, active BS.  No mass or HSM.   Extremities: 1 + edema Neuro/Psych:  + asterixis, drowsy but awake and oriented x 3.    Intake/Output from previous day: 05/08 0701 - 05/09 0700 In: 1472.9 [I.V.:1422.9; IV Piggyback:50] Out: 1175 [Urine:1175]  Intake/Output this shift:    Lab Results:  Recent Labs  03/04/15 2125 03/04/15 2139 03/05/15 0700 03/06/15 0245  WBC 6.2  --  5.9 5.4  HGB 12.3 15.0 11.1* 11.2*  HCT 38.9 44.0 35.0* 35.5*  PLT 139*  --  116* 118*   BMET  Recent Labs  03/05/15 0056 03/05/15 0700 03/06/15 0245  NA 141 140 142  K 3.2* 3.2* 3.6  CL 109 111 114*  CO2 22 20* 21*  GLUCOSE 147* 135* 76  BUN 13 12 10   CREATININE 1.23* 1.05* 1.00  CALCIUM 7.6* 7.4* 7.8*   LFT  Recent Labs  03/04/15 2222 03/05/15 0056 03/05/15 0700 03/06/15 0245  PROT 5.8* 5.6* 5.0* 5.0*  ALBUMIN 2.3* 2.0* 1.9* 1.9*  AST 68* 62* 57* 60*  ALT 33 33 30 28  ALKPHOS 156* 139* 136* 118  BILITOT 24.0* 20.9* 18.4* 16.9*  BILIDIR 15.6*  --   --   --   IBILI 8.4*  --   --   --    PT/INR  Recent Labs  03/05/15 0053 03/06/15 0245    LABPROT 21.6* 22.0*  INR 1.86* 1.90*   Hepatitis Panel  Recent Labs  03/05/15 0053  HEPBSAG NEGATIVE  HCVAB Reactive*  HEPAIGM NON REACTIVE  HEPBIGM NON REACTIVE    Studies/Results: US Abdomen Complete  03/05/2015   CLINICAL DATA:  Hyperbilirubinemia. History of hepatitis-C and cirrhosis.  EXAM: ULTRASOUND ABDOMEN COMPLETE  COMPARISON:  Ultrasound, 09/06/2014  FINDINGS: Gallbladder: Mildly distended. There is sludge in gallstones. Significant wall thickening measuring 1 cm. No tenderness to transducer pressure over the gallbladder.  Common bile duct: Diameter: 5.2 mm  Liver: Coarsened liver echotexture with surface nodularity. No discrete mass or lesion. No convincing portal vein flow.  IVC: No abnormality visualized.  Pancreas: Visualized portion unremarkable.  Spleen: Enlarged measuring 15 cm in greatest dimension with a volume of 500 mL. Evidence of a perisplenic varix.  Right Kidney: Length: 11.2 cm. Echogenicity within normal limits. No mass or hydronephrosis visualized.  Left Kidney: Length: 11.3 cm. Echogenicity within normal limits. No mass or hydronephrosis visualized.  Abdominal aorta: No aneurysm visualized.  Other findings: Ascites, small in overall amount, collecting adjacent  to the liver and in the lower quadrants of the abdomen.  IMPRESSION: 1. Cirrhosis with portal venous hypertension reflected by splenomegaly and ascites. 2. No document oval flow in the portal vein. It may either be thrombosed or have significantly reduced flow due to portal venous hypertension. 3. Gallstones in gallbladder sludge. Significant wall thickening. The wall thickening is nonspecific in the setting of ascites. There is no positive sonographic Murphy's sign to support acute cholecystitis. 4. No bile duct dilation. There is no evidence of biliary obstruction. Hyperbilirubinemia is likely on the basis of liver dysfunction.   Electronically Signed   By: Lajean Manes M.D.   On: 03/05/2015 09:06   Dg Chest  Port 1v Same Day  03/05/2015   CLINICAL DATA:  Encephalopathy.  EXAM: PORTABLE CHEST - 1 VIEW SAME DAY  COMPARISON:  Chest radiograph 01/23/2014.  FINDINGS: Cardiopericardial silhouette within normal limits. Mediastinal contours normal. Trachea midline. No airspace disease or effusion. Apical lordotic projection.  Monitoring leads project over the chest.  IMPRESSION: No active disease.   Electronically Signed   By: Dereck Ligas M.D.   On: 03/05/2015 15:26   Scheduled Meds: . cefTRIAXone (ROCEPHIN)  IV  2 g Intravenous Q24H  . lactulose  30 g Per Tube TID  . pneumococcal 23 valent vaccine  0.5 mL Intramuscular Tomorrow-1000  . rifaximin  550 mg Oral BID   Continuous Infusions: . sodium chloride     PRN Meds:.ondansetron **OR** ondansetron (ZOFRAN) IV  ASSESMENT:   *  Acute jaundice/hyperbilirubinemia, AMS in pt with Hep C cirrhosis,  Followed by Dr Patsy [place], [facility] hepatology. No visits with DB since 2012.  Linzie Collin and Ribaviran initiated 11/08/2014 (6 month Rx), compliant with Rx per 02/15/15 pharm D note (pill count correct) .  Week 2 virus count 50, c/w [id number] in 09/2014 pre Viekira.  08/2014 elastography: metavir F3-F4. Hep A and B immune.  Ultrasound with GB sludge, non-specific GB wall thickening, no flow seen in PV t bili improved.   *  Hx esophageal varices, grade 1 on 09/2011 EGD. Also esophagitis and portal gastropathy without gastric varices. No need for further endoscopy as long as she remains on propranolol per Dr Patsy Baltimore note 12/19/14.    *  Hypoalbuminemia.   *  Macrocytosis without anemia.   *  Thrombocytopenia.   *  tox screen + 5/8 for amphetimines, benzos.   *  AMS.  Ammonia 129 so likely HE. Improved with Lactulose. Still with asterixis.   *  Coagulopathy.    *  Small volume scites per Ultrasound.     *  Hx cervical cancer.    PLAN   *  Transporting now for ultrasound paracentesis.   *   Restarted the Viekira/Ribaviran and inderal and PPI.  Can use home supply.    *  Continue po lactulose.     Azucena Freed  03/06/2015, 10:27 AM Pager: [phone]

## 2015-03-06 NOTE — Progress Notes (Signed)
Contaced pt's daughter Lenna Sciara, updated to bring pt meds from home, daughter stated will bring

## 2015-03-07 ENCOUNTER — Encounter (HOSPITAL_COMMUNITY): Payer: Self-pay | Admitting: Internal Medicine

## 2015-03-07 DIAGNOSIS — K7200 Acute and subacute hepatic failure without coma: Secondary | ICD-10-CM

## 2015-03-07 DIAGNOSIS — R8290 Unspecified abnormal findings in urine: Secondary | ICD-10-CM

## 2015-03-07 DIAGNOSIS — F418 Other specified anxiety disorders: Secondary | ICD-10-CM

## 2015-03-07 DIAGNOSIS — G894 Chronic pain syndrome: Secondary | ICD-10-CM

## 2015-03-07 DIAGNOSIS — F1020 Alcohol dependence, uncomplicated: Secondary | ICD-10-CM

## 2015-03-07 DIAGNOSIS — K7460 Unspecified cirrhosis of liver: Secondary | ICD-10-CM

## 2015-03-07 DIAGNOSIS — K713 Toxic liver disease with chronic persistent hepatitis: Secondary | ICD-10-CM

## 2015-03-07 DIAGNOSIS — N179 Acute kidney failure, unspecified: Secondary | ICD-10-CM

## 2015-03-07 DIAGNOSIS — E8809 Other disorders of plasma-protein metabolism, not elsewhere classified: Secondary | ICD-10-CM

## 2015-03-07 DIAGNOSIS — K7290 Hepatic failure, unspecified without coma: Secondary | ICD-10-CM

## 2015-03-07 DIAGNOSIS — T50905A Adverse effect of unspecified drugs, medicaments and biological substances, initial encounter: Secondary | ICD-10-CM

## 2015-03-07 DIAGNOSIS — K7031 Alcoholic cirrhosis of liver with ascites: Secondary | ICD-10-CM

## 2015-03-07 HISTORY — DX: Adverse effect of unspecified drugs, medicaments and biological substances, initial encounter: T50.905A

## 2015-03-07 HISTORY — DX: Hepatic failure, unspecified without coma: K72.90

## 2015-03-07 HISTORY — DX: Unspecified cirrhosis of liver: K74.60

## 2015-03-07 LAB — COMPREHENSIVE METABOLIC PANEL
ALBUMIN: 1.9 g/dL — AB (ref 3.5–5.0)
ALK PHOS: 124 U/L (ref 38–126)
ALT: 37 U/L (ref 14–54)
AST: 69 U/L — ABNORMAL HIGH (ref 15–41)
Anion gap: 6 (ref 5–15)
BUN: 13 mg/dL (ref 6–20)
CALCIUM: 8 mg/dL — AB (ref 8.9–10.3)
CO2: 19 mmol/L — ABNORMAL LOW (ref 22–32)
Chloride: 110 mmol/L (ref 101–111)
Creatinine, Ser: 1.13 mg/dL — ABNORMAL HIGH (ref 0.44–1.00)
GFR calc Af Amer: >60 mL/min (ref >60)
GFR calc non Af Amer: 54 mL/min — ABNORMAL LOW (ref >60)
Glucose, Bld: 174 mg/dL — ABNORMAL HIGH (ref 70–99)
POTASSIUM: 3.3 mmol/L — AB (ref 3.5–5.1)
Sodium: 135 mmol/L (ref 135–145)
TOTAL PROTEIN: 5.4 g/dL — AB (ref 6.5–8.1)
Total Bilirubin: 18.9 mg/dL (ref 0.3–1.2)

## 2015-03-07 LAB — CBC
HCT: 37.5 % (ref 36.0–46.0)
HEMOGLOBIN: 11.7 g/dL — AB (ref 12.0–15.0)
MCH: 33.6 pg (ref 26.0–34.0)
MCHC: 31.2 g/dL (ref 30.0–36.0)
MCV: 107.8 fL — ABNORMAL HIGH (ref 78.0–100.0)
Platelets: 121 10*3/uL — ABNORMAL LOW (ref 150–400)
RBC: 3.48 MIL/uL — ABNORMAL LOW (ref 3.87–5.11)
RDW: 17.6 % — ABNORMAL HIGH (ref 11.5–15.5)
WBC: 7.4 10*3/uL (ref 4.0–10.5)

## 2015-03-07 MED ORDER — LACTULOSE 10 GM/15ML PO SOLN
20.0000 g | Freq: Two times a day (BID) | ORAL | Status: DC
  Filled 2015-03-07 (×4): qty 30

## 2015-03-07 MED ORDER — SPIRONOLACTONE 25 MG PO TABS
25.0000 mg | ORAL_TABLET | Freq: Every day | ORAL | Status: DC
  Filled 2015-03-07: qty 1

## 2015-03-07 MED ORDER — SPIRONOLACTONE 25 MG PO TABS
25.0000 mg | ORAL_TABLET | Freq: Every day | ORAL | Status: DC
  Administered 2015-03-08 – 2015-03-09 (×2): 25 mg via ORAL
  Filled 2015-03-07 (×2): qty 1

## 2015-03-07 MED ORDER — POTASSIUM CHLORIDE CRYS ER 20 MEQ PO TBCR
40.0000 meq | EXTENDED_RELEASE_TABLET | Freq: Once | ORAL | Status: AC
  Administered 2015-03-07: 40 meq via ORAL
  Filled 2015-03-07: qty 2

## 2015-03-07 MED ORDER — TRAMADOL HCL 50 MG PO TABS
50.0000 mg | ORAL_TABLET | Freq: Four times a day (QID) | ORAL | Status: DC | PRN
  Administered 2015-03-07: 50 mg via ORAL
  Filled 2015-03-07: qty 1

## 2015-03-07 MED ORDER — FUROSEMIDE 20 MG PO TABS
20.0000 mg | ORAL_TABLET | Freq: Every day | ORAL | Status: DC
  Administered 2015-03-08 – 2015-03-09 (×2): 20 mg via ORAL
  Filled 2015-03-07 (×2): qty 1

## 2015-03-07 NOTE — Evaluation (Addendum)
Occupational Therapy Evaluation Patient Details Name: [name] [name] MRN: [id number] DOB: October 29, 1960 Today's Date: 03/07/2015    History of Present Illness pt presents with Hepatic Encephalopathy, UTI, and AMS.  pt with hx of chronic Hepatitis C and Cirrhosis.     Clinical Impression   Pt admitted with above. Recommending SNF for rehab as pt lives alone. Unsure of PLOF due to apparent cognitive deficits.     Follow Up Recommendations  SNF;Supervision/Assistance - 24 hour    Equipment Recommendations  Other (comment) (defer to next venue)    Recommendations for Other Services       Precautions / Restrictions Precautions Precautions: Fall Restrictions Weight Bearing Restrictions: No      Mobility Bed Mobility Overal bed mobility: Needs Assistance Bed Mobility: Sit to Supine     Sit to supine: Supervision   General bed mobility comments: cues to scoot HOB  Transfers Overall transfer level: Needs assistance Transfers: Sit to/from Stand Sit to Stand: Min guard       General transfer comment: Min guard for safety.    Balance Min assist for ambulation (very short distance)-pt unsteady.                         ADL Overall ADL's : Needs assistance/impaired             Lower Body Bathing: Sit to/from stand;Minimal-Moderate assistance   Upper Body Dressing : Set up;Supervision/safety;Sitting   Lower Body Dressing: Maximal assistance;Sit to/from stand   Toilet Transfer: Minimal assistance;Ambulation (chair/bed)   Toileting- Clothing Manipulation and Hygiene: Min guard;Sit to/from stand       Functional mobility during ADLs: Minimal assistance General ADL Comments: Pt had difficulty reaching feet, but managed to doff one sock. Ambulated short distance in room.Pt stood at EOB and washed bottom.     Vision  Pt wears glasses.   Perception     Praxis      Pertinent Vitals/Pain Pain Assessment: 0-10 Pain Score: 8  Pain Location: back Pain  Descriptors / Indicators: Aching Pain Intervention(s): Monitored during session;Repositioned;Limited activity within patient's tolerance  O2 in 90s at end of session on RA.     Hand Dominance     Extremity/Trunk Assessment Upper Extremity Assessment Upper Extremity Assessment: Overall WFL for tasks assessed   Lower Extremity Assessment Lower Extremity Assessment: Defer to PT evaluation      Communication Communication Communication: No difficulties   Cognition Arousal/Alertness: Awake/alert Behavior During Therapy: Flat affect Overall Cognitive Status: No family/caregiver present to determine baseline cognitive functioning (not oriented to time/slow processing; decreased short term memory)                      General Comments       Exercises       Shoulder Instructions      Home Living Family/patient expects to be discharged to:: Unsure Living Arrangements: Alone                               Additional Comments: pt states she has walk in shower      Prior Functioning/Environment Level of Independence: Independent        Comments: pt indicates that ADLs are effortful, but she can perform (unsure of PLOF as pt with apparent decreased cognition)    OT Diagnosis: Altered mental status;Acute pain;Generalized weakness   OT Problem List: Decreased strength;Decreased activity tolerance;Decreased range  of motion;Impaired balance (sitting and/or standing);Decreased knowledge of use of DME or AE;Decreased knowledge of precautions;Decreased cognition;Pain   OT Treatment/Interventions: Self-care/ADL training;Therapeutic exercise;DME and/or AE instruction;Therapeutic activities;Balance training;Patient/family education;Cognitive remediation/compensation    OT Goals(Current goals can be found in the care plan section) Acute Rehab OT Goals Patient Stated Goal: not stated OT Goal Formulation: With patient Time For Goal Achievement: 03/14/15 Potential  to Achieve Goals: Good ADL Goals Pt Will Perform Lower Body Bathing: with set-up;sit to/from stand Pt Will Perform Lower Body Dressing: sit to/from stand;with set-up Pt Will Transfer to Toilet: with modified independence;ambulating Pt Will Perform Toileting - Clothing Manipulation and hygiene: with modified independence;sit to/from stand  OT Frequency: Min 2X/week   Barriers to D/C:            Co-evaluation              End of Session Equipment Utilized During Treatment: Gait belt;Oxygen (O2 for part of session) Nurse Communication: Mobility status;Other (comment) (left O2 off; O2 sats; cognition)  Activity Tolerance: Patient limited by pain;Patient limited by fatigue Patient left: in bed;with call bell/phone within reach;with bed alarm set   Time: 9735-3299 OT Time Calculation (min): 15 min Charges:  OT General Charges $OT Visit: 1 Procedure OT Evaluation $Initial OT Evaluation Tier I: 1 Procedure G-CodesBenito Mccreedy OTR/L [phone] 03/07/2015, 3:38 PM

## 2015-03-07 NOTE — Progress Notes (Signed)
CRITICAL VALUE ALERT  Critical value received:  Total Bilirubin = 18.9  Date of notification:  03/07/2015  Time of notification:  0528  Critical value read back:Yes.    Nurse who received alert:  K. Royden Purl  MD notified (1st page):  Dr. Raelene Bott  Time of first page:  0530  MD notified (2nd page):  Time of second page:  Responding MD:  Dr. Raelene Bott  Time MD responded:  [phone]

## 2015-03-07 NOTE — Progress Notes (Signed)
Subjective: No acute events overnight. Patient appears more oriented overall. Patient reports generalized soreness as well as extreme discomfort from the bedpan and diarrhea. Patient is very resistant to taking lactulose given this discomfort.  Objective: Vital signs in last 24 hours: Filed Vitals:   03/06/15 2356 03/07/15 0359 03/07/15 0401 03/07/15 0751  BP: 115/61  120/67 100/70  Pulse: 88  84 77  Temp:  98.7 F (37.1 C)  98.1 F (36.7 C)  TempSrc:  Oral  Oral  Resp: 16  17 16   Height:      Weight:      SpO2: 91%  90% 93%   Weight change:   Intake/Output Summary (Last 24 hours) at 03/07/15 1962 Last data filed at 03/07/15 0141  Gross per 24 hour  Intake 936.67 ml  Output    600 ml  Net 336.67 ml   Physical Exam: General: lying in bed, uncomfortable, NAD, jaundiced throughout body Eyes: Scleral icterus present.  Cardio: RRR, nl S1 and S2, no murmurs, rubs, or gallops Pulm: CTAB, nl WOB Abdomen: soft, nontender, mildly distended Extremities: 1+ pitting edema in BLEs.  Neurological: Oriented to self, place, city  Lab Results: Basic Metabolic Panel:  Recent Labs Lab 03/05/15 0700 03/06/15 0245 03/07/15 0257  NA 140 142 135  K 3.2* 3.6 3.3*  CL 111 114* 110  CO2 20* 21* 19*  GLUCOSE 135* 76 174*  BUN 12 10 13   CREATININE 1.05* 1.00 1.13*  CALCIUM 7.4* 7.8* 8.0*  MG 1.7  --   --   PHOS 3.1  --   --    Liver Function Tests:  Recent Labs Lab 03/06/15 0245 03/07/15 0257  AST 60* 69*  ALT 28 37  ALKPHOS 118 124  BILITOT 16.9* 18.9*  PROT 5.0* 5.4*  ALBUMIN 1.9* 1.9*   CBC:  Recent Labs Lab 03/06/15 0245 03/07/15 0257  WBC 5.4 7.4  HGB 11.2* 11.7*  HCT 35.5* 37.5  MCV 106.3* 107.8*  PLT 118* 121*   Coagulation:  Recent Labs Lab 03/05/15 0053 03/06/15 0245  LABPROT 21.6* 22.0*  INR 1.86* 1.90*    Micro Results: Recent Results (from the past 240 hour(s))  Culture, blood (routine x 2)     Status: None (Preliminary result)   Collection Time: 03/05/15 12:30 AM  Result Value Ref Range Status   Specimen Description BLOOD LEFT ANTECUBITAL  Final   Special Requests BOTTLES DRAWN AEROBIC AND ANAEROBIC 10CC EA  Final   Culture   Final           BLOOD CULTURE RECEIVED NO GROWTH TO DATE CULTURE WILL BE HELD FOR 5 DAYS BEFORE ISSUING A FINAL NEGATIVE REPORT Performed at [facility]    Report Status PENDING  Incomplete  Culture, blood (routine x 2)     Status: None (Preliminary result)   Collection Time: 03/05/15 12:38 AM  Result Value Ref Range Status   Specimen Description BLOOD RIGHT HAND  Final   Special Requests BOTTLES DRAWN AEROBIC AND ANAEROBIC 5CC EA  Final   Culture   Final           BLOOD CULTURE RECEIVED NO GROWTH TO DATE CULTURE WILL BE HELD FOR 5 DAYS BEFORE ISSUING A FINAL NEGATIVE REPORT Performed at [facility]    Report Status PENDING  Incomplete  MRSA PCR Screening     Status: None   Collection Time: 03/05/15  2:44 AM  Result Value Ref Range Status   MRSA by PCR NEGATIVE NEGATIVE Final  Comment:        The GeneXpert MRSA Assay (FDA approved for NASAL specimens only), is one component of a comprehensive MRSA colonization surveillance program. It is not intended to diagnose MRSA infection nor to guide or monitor treatment for MRSA infections.   Body fluid culture     Status: None (Preliminary result)   Collection Time: 03/06/15 10:58 AM  Result Value Ref Range Status   Specimen Description FLUID PERITONEAL  Final   Special Requests NONE  Final   Gram Stain   Final    RARE WBC PRESENT, PREDOMINANTLY MONONUCLEAR NO ORGANISMS SEEN Performed at [facility]    Culture PENDING  Incomplete   Report Status PENDING  Incomplete   Studies/Results: US Paracentesis  03/06/2015   INDICATION: Ascites, request for diagnostic and therapeutic paracentesis.  EXAM: ULTRASOUND-GUIDED PARACENTESIS  COMPARISON:  None.  MEDICATIONS: None.  COMPLICATIONS: None immediate   TECHNIQUE: Informed written consent was obtained from the patient's daughter after a discussion of the risks, benefits and alternatives to treatment. A timeout was performed prior to the initiation of the procedure.  Initial ultrasound scanning demonstrates a small amount of ascites within the right upper abdominal quadrant. The right upper abdomen was prepped and draped in the usual sterile fashion. 1% lidocaine was used for local anesthesia.  Under direct ultrasound guidance, a 19 gauge, 10-cm, Yueh catheter was introduced. An ultrasound image was saved for documentation purposed. The paracentesis was performed. The catheter was removed and a dressing was applied. The patient tolerated the procedure well without immediate post procedural complication.  FINDINGS: A total of approximately 1.2 liters of serous fluid was removed. Samples were sent to the laboratory as requested by the clinical team.  IMPRESSION: Successful ultrasound-guided paracentesis yielding 1.2 liters of peritoneal fluid.  Read By:  Tsosie Billing PA-C   Electronically Signed   By: Marybelle Killings M.D.   On: 03/06/2015 11:37   Medications: I have reviewed the patient's current medications. Scheduled Meds: . cefTRIAXone (ROCEPHIN)  IV  2 g Intravenous Q24H  . lactulose  30 g Oral TID  . Ombitas-Paritapre-Ritona-Dasab  1 tablet Oral Q supper  . Ombitas-Paritapre-Ritona-Dasab  3 tablet Oral Daily  . pneumococcal 23 valent vaccine  0.5 mL Intramuscular Tomorrow-1000  . propranolol  20 mg Oral Daily  . ribavirin  600 mg Oral BID  . rifaximin  550 mg Oral BID   Continuous Infusions:  PRN Meds:.ondansetron **OR** ondansetron (ZOFRAN) IV   Assessment/Plan: Principal Problem:   Hepatic encephalopathy Active Problems:   Chronic hepatitis C with cirrhosis   Esophageal varices in cirrhosis   Altered mental status   UTI (urinary tract infection)   Hyperbilirubinemia   Cirrhosis of liver   Encephalopathy   [name] [name] is a 18 yoF  with hx of Hepatitis C, alcohol-induced cirrhosis and hepatic encephalopathy here with hepatic encephalopathy and acute hyperbilirubinemia, possibly 2/2 to portal vein obstruction or UTI. Will transfer from stepdown to floor today.  Acute Hyperbilirubinemia: TBili elevated from 16.9 (5/9) to 18.9 today. TBili of 0.2 in 01/2015. Abdominal ultrasound shows no flow in portal vein, suggesting possible portal vein thrombosis or significantly reduced flow due to portal venous hypertension. DDx also includes medications, medications, end stage hepatic disease (al though AST/ALT not elevated significantly and her LFT was normal on 01/2015), infection, autoimmune process, acute alcoholic hepatitis (although AST/ALT not elevated), or paraneoplastic syndrome from malignancy causing cholestatics (common from gynecologic malignancy, renal cell carcinoma, etc.). Patient remains afebrile and is no  longer complaining of suprapubic tenderness, however U/A c/w UTI so treating with ceftriaxone. MELD score 27 (19.6% 3 month mortality). Blood culture shows NGTD. Ascitic fluid shows NGTD. - GI consulted: appreciate their recommendations - daily CMP - f/u blood, ascitic cultures - f/u anti-mitochondrial antibody  Hepatic Encephalopathy: Likely 2/2 to hepatic encephalopathy or 2/2 to severe hyperbilirubinemia. Could also be 2/2 to her psych meds in the setting of liver failure leading to drug accumulation. Ammonia 129. Alcohol and tylenol level negative. UDS positive for benzodiazapines and methamphetamines. Appears less confused. - oral lactulose - hold all psych meds/sedatives - neuro checks  Chronic Hepatitis C and Alcohol-Induced Cirrhosis w/ Esophageal Varices: Liver bx 04/2008 showed grade 2 stage II-III disease, mixed micro and macro steatosis consistent with serohepatitis in addition to Hep C. AFP elevated to 54.6 in 2013.She was started on Viekira Pak/Ribavirin for 24 weeks on 11/08/2014 by [facility]. Per  hepatology note from 12/19/2014, she is not on the transplant list as her MELD score was 11 previously and she is a poor candidate for transplant because of her psychiatric co-morbidites. Followed by Dr. Olevia Perches of [facility]. Discussed MRI to r/o [facility] given elevated AFP in 2013 with Dr. Ardis Hughs who recommended f/u and possible outpatient MR within the next month. - GI consulted: continue Vit K IV for 2 days, continue Xifaxan - continue home Viekira/Ribaviran - resume Inderal and PPI  UTI: U/A consistent with UTI. Patient presented with dysuria and suprapubic pain. No longer reports suprapubic pain. - continue ceftriaxone  AKI: Baseline SCr ~0.5, now 1.13, up from 1.00 yesterday. Likely 2/2 to low PO intake. - IVF - daily CMP - hold home lasix  Anxiety/depression - On remeron 45mg  QHS, klonopin 1mg  TID at home. - hold home psychiatric medications setting of AMS - consider ativan for anxiety  COPD: No acute exacerbation. - hold home albuterol, combivent, flonase, proair  GERD  - resume PPI  FEN/GI: Diet: Regular DVT PPx: SCDs  Dispo: Disposition is deferred at this time, awaiting improvement of current medical problems. Anticipated discharge in approximately 3-4 day(s).   The patient does have a current PCP Milagros Loll, MD) and does need an [facility] hospital follow-up appointment after discharge.  The patient does have transportation limitations that hinder transportation to clinic appointments.  This is a Careers information officer Note.  The care of the patient was discussed with Dr. Larey Dresser and the assessment and plan formulated with their assistance.  Please see their attached note for official documentation of the daily encounter.   LOS: 2 days   Reuben Likes, Med Student 03/07/2015, 8:33 AM

## 2015-03-07 NOTE — Progress Notes (Signed)
Daily Rounding Note  03/07/2015, 9:00 AM  LOS: 2 days   SUBJECTIVE:       S/p 1.2 liter paracentesis.  Says she hurts all over.  Has been confined to bed.  Appetite still poor but no nausea, no abd pain.  4 stools already today: loose, brown.  Some BMs yesterday.  Less confused per RN.   OBJECTIVE:         Vital signs in last 24 hours:    Temp:  [98.1 F (36.7 C)-99.2 F (37.3 C)] 98.1 F (36.7 C) (05/10 0751) Pulse Rate:  [75-88] 77 (05/10 0751) Resp:  [15-21] 16 (05/10 0751) BP: (100-120)/(61-72) 100/70 mmHg (05/10 0751) SpO2:  [90 %-95 %] 93 % (05/10 0751) Last BM Date: 03/06/15 [facility] Weights   03/04/15 2120 03/05/15 0133  Weight: 193 lb (87.544 kg) 199 lb 12.8 oz (90.629 kg)   General: ill, jaundiced.  comfortable   Heart: RRR.   Chest: clear bil.  No labored breathing or cough.  Abdomen: soft, NT, obese, ND.  Active BS  Extremities: no CCE.   Neuro/Psych:  + asterixis.  Oriented x 3 but still with psycho-motor retardation.   Intake/Output from previous day: 05/09 0701 - 05/10 0700 In: 936.7 [P.O.:600; I.V.:286.7; IV Piggyback:50] Out: 600 [Urine:600]  Intake/Output this shift:    Lab Results:  Recent Labs  03/05/15 0700 03/06/15 0245 03/07/15 0257  WBC 5.9 5.4 7.4  HGB 11.1* 11.2* 11.7*  HCT 35.0* 35.5* 37.5  PLT 116* 118* 121*   BMET  Recent Labs  03/05/15 0700 03/06/15 0245 03/07/15 0257  NA 140 142 135  K 3.2* 3.6 3.3*  CL 111 114* 110  CO2 20* 21* 19*  GLUCOSE 135* 76 174*  BUN 12 10 13   CREATININE 1.05* 1.00 1.13*  CALCIUM 7.4* 7.8* 8.0*   LFT  Recent Labs  03/04/15 2222  03/05/15 0700 03/06/15 0245 03/07/15 0257  PROT 5.8*  < > 5.0* 5.0* 5.4*  ALBUMIN 2.3*  < > 1.9* 1.9* 1.9*  AST 68*  < > 57* 60* 69*  ALT 33  < > 30 28 37  ALKPHOS 156*  < > 136* 118 124  BILITOT 24.0*  < > 18.4* 16.9* 18.9*  BILIDIR 15.6*  --   --   --   --   IBILI 8.4*  --   --   --   --   < >  = values in this interval not displayed. PT/INR  Recent Labs  03/05/15 0053 03/06/15 0245  LABPROT 21.6* 22.0*  INR 1.86* 1.90*   Hepatitis Panel  Recent Labs  03/05/15 0053  HEPBSAG NEGATIVE  HCVAB Reactive*  HEPAIGM NON REACTIVE  HEPBIGM NON REACTIVE   02  Ref. Range 03/06/2015 10:58  Monocyte-Macrophage-Serous Fluid Latest Ref Range: 50-90 % 61  Fluid Type-FCT Unknown Peritoneal  Color, Fluid Latest Ref Range: YELLOW  YELLOW (A)  WBC, Fluid Latest Ref Range: 0-1000 cu mm 85  Lymphs, Fluid Latest Units: % 36  Appearance, Fluid Latest Ref Range: CLEAR  HAZY (A)  Neutrophil Count, Fluid Latest Ref Range: 0-25 % 3   Studies/Results: US Paracentesis  03/06/2015   INDICATION: Ascites, request for diagnostic and therapeutic paracentesis.  EXAM: ULTRASOUND-GUIDED PARACENTESIS  COMPARISON:  None.  MEDICATIONS: None.  COMPLICATIONS: None immediate  TECHNIQUE: Informed written consent was obtained from the patient's daughter after a discussion of the risks, benefits and alternatives to treatment. A timeout was performed prior to  the initiation of the procedure.  Initial ultrasound scanning demonstrates a small amount of ascites within the right upper abdominal quadrant. The right upper abdomen was prepped and draped in the usual sterile fashion. 1% lidocaine was used for local anesthesia.  Under direct ultrasound guidance, a 19 gauge, 10-cm, Yueh catheter was introduced. An ultrasound image was saved for documentation purposed. The paracentesis was performed. The catheter was removed and a dressing was applied. The patient tolerated the procedure well without immediate post procedural complication.  FINDINGS: A total of approximately 1.2 liters of serous fluid was removed. Samples were sent to the laboratory as requested by the clinical team.  IMPRESSION: Successful ultrasound-guided paracentesis yielding 1.2 liters of peritoneal fluid.  Read By:  Tsosie Billing PA-C   Electronically Signed   By:  Marybelle Killings M.D.   On: 03/06/2015 11:37   Dg Chest Port 1v Same Day  03/05/2015   CLINICAL DATA:  Encephalopathy.  EXAM: PORTABLE CHEST - 1 VIEW SAME DAY  COMPARISON:  Chest radiograph 01/23/2014.  FINDINGS: Cardiopericardial silhouette within normal limits. Mediastinal contours normal. Trachea midline. No airspace disease or effusion. Apical lordotic projection.  Monitoring leads project over the chest.  IMPRESSION: No active disease.   Electronically Signed   By: Dereck Ligas M.D.   On: 03/05/2015 15:26   Scheduled Meds: . cefTRIAXone (ROCEPHIN)  IV  2 g Intravenous Q24H  . lactulose  30 g Oral TID  . Ombitas-Paritapre-Ritona-Dasab  1 tablet Oral Q supper  . Ombitas-Paritapre-Ritona-Dasab  3 tablet Oral Daily  . pneumococcal 23 valent vaccine  0.5 mL Intramuscular Tomorrow-1000  . propranolol  20 mg Oral Daily  . ribavirin  600 mg Oral BID  . rifaximin  550 mg Oral BID   Continuous Infusions:  PRN Meds:.ondansetron **OR** ondansetron (ZOFRAN) IV  ASSESMENT:   * Acute jaundice/hyperbilirubinemia, AMS in pt with Hep C cirrhosis,  Followed by Dr Patsy [place], [facility] hepatology. No visits with DB since 2012. Linzie Collin and Ribaviran initiated 11/08/2014 (6 month Rx), compliant with Rx per 02/15/15 pharm D note (pill count correct) . Week 2 virus count 50, c/w [id number] in 09/2014 pre Viekira. 08/2014 elastography: metavir F3-F4. Hep A and B immune.  Ultrasound with GB sludge, non-specific GB wall thickening, no flow seen in PV t bili improved.   * Small volume ascites per Ultrasound. s/p 1.2 liter paracentesis 03/06/15.  Fluid studies: No evidence SBP  * Hx esophageal varices, grade 1 on 09/2011 EGD. Also esophagitis and portal gastropathy without gastric varices. No need for further endoscopy as long as she remains on propranolol per Dr Patsy Baltimore note 12/19/14.   * Hypoalbuminemia.   * Macrocytosis without anemia.   * Thrombocytopenia.   * tox screen + 5/8 for amphetimines, benzos.   *  AMS. Ammonia 129 so likely HE. On tid po lactulose.  Multiple loose stools. Mental status improved.  Goal is 2 to 3 BMs per day.   * Coagulopathy.   *  Hypokalemia.   * Hx cervical cancer.  Ascitic fluid not sent for cytology.  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   PLAN   *  Reduced Lactulose to 20 gm BID.   ? restart Lasix 20 mg BID for now.   However she may benefit from adding Aldactone, which may require lowering dose of Lasix.  This is the ideal diuretic combo for ascites and will help with hypokalemia.  I will d/w Dr Carlean Purl.   *  Has ROV with Dr Patsy Baltimore in [facility] on 5/19 at 1:30 PM.  She no showed for her last appt in April.    Azucena Freed  03/07/2015, 9:00 AM Pager: [phone]  [facility] Attending  I have also seen and assessed the patient and agree with the advanced practitioner's assessment and plan. My exam and hx same. She remains very ill but is improved with supportive care. I investigated and her HCV Tx can cause hepatic decompensation and should not be given in [facility] which is where she is now so Iam discontinuing it. Will try to notify Dr. Patsy Baltimore also. Will ask pharmacy to also file adverse drug event  Her non-compliance does not bode well for future Tx (no show, ? Where amphetamines came from)  We can start diuretics tomorrow Go low with furosemide 20 and spironolactone 25 mg each AM   Gatha Mayer, MD, [facility] [phone] (pager) 03/07/2015 3:15 PM

## 2015-03-07 NOTE — Progress Notes (Signed)
Subjective: Pt states she is anxious today and c/o chronic pain and pain with being on bed pan.  She is able to eat/drink.  Feeling okay today.    Objective: Vital signs in last 24 hours: Filed Vitals:   03/06/15 2356 03/07/15 0359 03/07/15 0401 03/07/15 0751  BP: 115/61  120/67 100/70  Pulse: 88  84 77  Temp:  98.7 F (37.1 C)  98.1 F (36.7 C)  TempSrc:  Oral  Oral  Resp: 16  17 16   Height:      Weight:      SpO2: 91%  90% 93%   Weight change:   Intake/Output Summary (Last 24 hours) at 03/07/15 1648 Last data filed at 03/07/15 0141  Gross per 24 hour  Intake     50 ml  Output    450 ml  Net   -400 ml   Vitals reviewed. General: resting in bed, NAD HEENT: [place]/at, + scleral icterus Cardiac: RRR, no rubs, murmurs or gallops Pulm: clear to auscultation bilaterally, no wheezes, rales, or rhonchi Abd: soft, nontender, nondistended, BS present Ext: warm and well perfused, no pedal edema, +jaundice skin Neuro: alert and oriented X3, cranial nerves II-XII grossly intact, moving all 4 extremities, +asterixis on exam   Lab Results: Basic Metabolic Panel:  Recent Labs Lab 03/05/15 0700 03/06/15 0245 03/07/15 0257  NA 140 142 135  K 3.2* 3.6 3.3*  CL 111 114* 110  CO2 20* 21* 19*  GLUCOSE 135* 76 174*  BUN 12 10 13   CREATININE 1.05* 1.00 1.13*  CALCIUM 7.4* 7.8* 8.0*  MG 1.7  --   --   PHOS 3.1  --   --    Liver Function Tests:  Recent Labs Lab 03/06/15 0245 03/07/15 0257  AST 60* 69*  ALT 28 37  ALKPHOS 118 124  BILITOT 16.9* 18.9*  PROT 5.0* 5.4*  ALBUMIN 1.9* 1.9*    Recent Labs Lab 03/04/15 2222  AMMONIA 129*   CBC:  Recent Labs Lab 03/06/15 0245 03/07/15 0257  WBC 5.4 7.4  HGB 11.2* 11.7*  HCT 35.5* 37.5  MCV 106.3* 107.8*  PLT 118* 121*   CBG:  Recent Labs Lab 03/04/15 2124  GLUCAP 122*   Coagulation:  Recent Labs Lab 03/05/15 0053 03/06/15 0245  LABPROT 21.6* 22.0*  INR 1.86* 1.90*   Urine Drug Screen: Drugs of  Abuse     Component Value Date/Time   LABOPIA NONE DETECTED 03/05/2015 1830   LABOPIA NEG 07/22/2012 1527   COCAINSCRNUR NONE DETECTED 03/05/2015 1830   COCAINSCRNUR NEG 07/22/2012 1527   LABBENZ POSITIVE* 03/05/2015 1830   LABBENZ PPS 07/22/2012 1527   LABBENZ NEG 05/14/2010 2116   AMPHETMU POSITIVE* 03/05/2015 1830   AMPHETMU NEG 07/22/2012 1527   AMPHETMU NEG 05/14/2010 2116   THCU NONE DETECTED 03/05/2015 1830   THCU NEG 07/22/2012 1527   LABBARB NONE DETECTED 03/05/2015 1830   LABBARB NEG 07/22/2012 1527    Alcohol Level:  Recent Labs Lab 03/05/15 0038  ETH <5   Urinalysis:  Recent Labs Lab 03/05/15 1830  COLORURINE RED*  LABSPEC 1.023  PHURINE 5.5  GLUCOSEU NEGATIVE  HGBUR NEGATIVE  BILIRUBINUR LARGE*  KETONESUR 15*  PROTEINUR NEGATIVE  UROBILINOGEN 1.0  NITRITE POSITIVE*  LEUKOCYTESUR SMALL*   Misc. Labs: none  Micro Results: Recent Results (from the past 240 hour(s))  Culture, blood (routine x 2)     Status: None (Preliminary result)   Collection Time: 03/05/15 12:30 AM  Result Value Ref  Range Status   Specimen Description BLOOD LEFT ANTECUBITAL  Final   Special Requests BOTTLES DRAWN AEROBIC AND ANAEROBIC 10CC EA  Final   Culture   Final           BLOOD CULTURE RECEIVED NO GROWTH TO DATE CULTURE WILL BE HELD FOR 5 DAYS BEFORE ISSUING A FINAL NEGATIVE REPORT Performed at [facility]    Report Status PENDING  Incomplete  Culture, blood (routine x 2)     Status: None (Preliminary result)   Collection Time: 03/05/15 12:38 AM  Result Value Ref Range Status   Specimen Description BLOOD RIGHT HAND  Final   Special Requests BOTTLES DRAWN AEROBIC AND ANAEROBIC 5CC EA  Final   Culture   Final           BLOOD CULTURE RECEIVED NO GROWTH TO DATE CULTURE WILL BE HELD FOR 5 DAYS BEFORE ISSUING A FINAL NEGATIVE REPORT Performed at [facility]    Report Status PENDING  Incomplete  MRSA PCR Screening     Status: None   Collection Time:  03/05/15  2:44 AM  Result Value Ref Range Status   MRSA by PCR NEGATIVE NEGATIVE Final    Comment:        The GeneXpert MRSA Assay (FDA approved for NASAL specimens only), is one component of a comprehensive MRSA colonization surveillance program. It is not intended to diagnose MRSA infection nor to guide or monitor treatment for MRSA infections.   Body fluid culture     Status: None (Preliminary result)   Collection Time: 03/06/15 10:58 AM  Result Value Ref Range Status   Specimen Description FLUID PERITONEAL  Final   Special Requests NONE  Final   Gram Stain   Final    RARE WBC PRESENT, PREDOMINANTLY MONONUCLEAR NO ORGANISMS SEEN Performed at [facility]    Culture NO GROWTH Performed at [facility]   Final   Report Status PENDING  Incomplete   Studies/Results: US Paracentesis  03/06/2015   INDICATION: Ascites, request for diagnostic and therapeutic paracentesis.  EXAM: ULTRASOUND-GUIDED PARACENTESIS  COMPARISON:  None.  MEDICATIONS: None.  COMPLICATIONS: None immediate  TECHNIQUE: Informed written consent was obtained from the patient's daughter after a discussion of the risks, benefits and alternatives to treatment. A timeout was performed prior to the initiation of the procedure.  Initial ultrasound scanning demonstrates a small amount of ascites within the right upper abdominal quadrant. The right upper abdomen was prepped and draped in the usual sterile fashion. 1% lidocaine was used for local anesthesia.  Under direct ultrasound guidance, a 19 gauge, 10-cm, Yueh catheter was introduced. An ultrasound image was saved for documentation purposed. The paracentesis was performed. The catheter was removed and a dressing was applied. The patient tolerated the procedure well without immediate post procedural complication.  FINDINGS: A total of approximately 1.2 liters of serous fluid was removed. Samples were sent to the laboratory as requested by the clinical team.   IMPRESSION: Successful ultrasound-guided paracentesis yielding 1.2 liters of peritoneal fluid.  Read By:  Tsosie Billing PA-C   Electronically Signed   By: Marybelle Killings M.D.   On: 03/06/2015 11:37   Medications: Scheduled Meds: . cefTRIAXone (ROCEPHIN)  IV  2 g Intravenous Q24H  . [START ON 03/08/2015] furosemide  20 mg Oral Daily  . lactulose  20 g Oral BID  . pneumococcal 23 valent vaccine  0.5 mL Intramuscular Tomorrow-1000  . propranolol  20 mg Oral Daily  . rifaximin  550 mg Oral BID  . [START ON 03/08/2015] spironolactone  25 mg Oral Daily   Continuous Infusions:  PRN Meds:.ondansetron **OR** ondansetron (ZOFRAN) IV, traMADol Assessment/Plan: 54 y.o pmh COPD, Hepatitis C alcoholic cirrhosis, portal HTN, esophageal varices/splenomegaly, anxiety/depression, fatty liver, obesity, cervical cancer, GERD, HTN.  She presents with hepatic encephalopathy.   #Alcoholic+ HCV with decompensated liver disease/cirrhosis with hepatic encephalopathy (this admission), esophageal varices, portal HTN -HE improving this admission -Continue Lactulose decreased to 20 mg bid due to excessive stools. Continue Rifaximin  -Inderal 20 mg qd  -GI following, trending CMET, CBC, PT/INR -GI wants to add Lasix 20 mg qd (added)  and Aldactone 25 mg qd (added) to regimen -neuro checks  -will need to f/u with Dr. Burnetta Sabin hepatology (started on [place]) not giving this admission. Next appt 5/19 in [facility].  -GI held [place] as cause of hepatic decompensation which should not be given in [place] [facility]. Also likely will need to hold Linzie Collin will confirm with GI in am  #Hyperbilirubinemia -will trend bili. GI wondering if could be related to medications, previously thought to be 2/2 cirrhosis and HE from UTI -recheck bili am -Outpt MRI but if bili continues to trend up consider inpt MRI  #AKI -improving. Trend Cr.   #Suspected UTI (urinary tract infection) -Continue Rocephin  #Chronic  pain -added home Tramadol 50 mg q6  #Chronic anxiety/depression  -Bzs + UDS likely still has residual Klonopin with liver disease.  avoid Klonopin as hepatically cleared with liver disease.   -? If want to resume Benzo.  Consider Lorazepam in future if needed and also to avoid w/d seizures with chronic benzo use  -holding remeron for now  #F/E/N -none -regular diet  -HypoK-given Kdur 40 mg x 1.  CMET in am   #Hypoalbunemia   #DVT px  -scds due to low plts     Dispo: Disposition is deferred at this time, awaiting improvement of current medical problems.  Anticipated discharge in approximately 2 day(s).   The patient does have a current PCP Milagros Loll, MD) and does need an [facility] hospital follow-up appointment after discharge.  The patient does not have transportation limitations that hinder transportation to clinic appointments.  .Services Needed at time of discharge: Y = Yes, Blank = No PT: SNF  OT: SNF  RN:   Equipment:   Other:     LOS: 2 days   Cresenciano Genre, MD 03/07/2015, 4:48 PM

## 2015-03-07 NOTE — Evaluation (Signed)
Physical Therapy Evaluation Patient Details Name: [name] [name] MRN: [id number] DOB: 11-01-1960 Today's Date: 03/07/2015   History of Present Illness  pt presents with Hepatic Encephalopathy, UTI, and AMS.  pt with hx of chronic Hep C and Cirrhosis.    Clinical Impression  Pt generally unsteady and with decreased cognition, unclear if this is pt's baseline level of cognition.  Pt very flat and at times seems to follow PT's questions, but other times pt makes comments completely off topic.  Pt would benefit from SNF level of care at D/C and is not safe to return to home alone.  Will continue to follow.      Follow Up Recommendations SNF    Equipment Recommendations  None recommended by PT    Recommendations for Other Services       Precautions / Restrictions Precautions Precautions: Fall Restrictions Weight Bearing Restrictions: No      Mobility  Bed Mobility Overal bed mobility: Needs Assistance Bed Mobility: Supine to Sit     Supine to sit: Min assist     General bed mobility comments: pt needs increased time and cues for sequencing.  A to bring trunk up to sitting.    Transfers Overall transfer level: Needs assistance Equipment used: None Transfers: Sit to/from [facility] Sit to Stand: Min assist Stand pivot transfers: Min assist       General transfer comment: pt needs A for balance and safety.    Ambulation/Gait                Stairs            Wheelchair Mobility    Modified Rankin (Stroke Patients Only)       Balance Overall balance assessment: Needs assistance Sitting-balance support: No upper extremity supported;Feet supported Sitting balance-Leahy Scale: Fair     Standing balance support: No upper extremity supported;During functional activity Standing balance-Leahy Scale: Poor                               Pertinent Vitals/Pain Pain Assessment: Faces Faces Pain Scale: Hurts little more Pain  Location: pt indicates chronic back pain, but doesn't rate or describe when asked.   Pain Descriptors / Indicators: Grimacing Pain Intervention(s): Monitored during session;Repositioned    Home Living Family/patient expects to be discharged to:: Skilled nursing facility                      Prior Function Level of Independence: Independent         Comments: pt indicates she does everything for herself, but unsure as pt with cognitive deficits.       Hand Dominance        Extremity/Trunk Assessment   Upper Extremity Assessment: Defer to OT evaluation           Lower Extremity Assessment: Generalized weakness      Cervical / Trunk Assessment: Normal  Communication   Communication: No difficulties  Cognition Arousal/Alertness: Awake/alert Behavior During Therapy: Flat affect Overall Cognitive Status: No family/caregiver present to determine baseline cognitive functioning                      General Comments      Exercises        Assessment/Plan    PT Assessment Patient needs continued PT services  PT Diagnosis Difficulty walking;Generalized weakness;Altered mental status   PT Problem List Decreased strength;Decreased  activity tolerance;Decreased balance;Decreased mobility;Decreased coordination;Decreased cognition;Decreased knowledge of use of DME;Decreased safety awareness  PT Treatment Interventions DME instruction;Gait training;Functional mobility training;Therapeutic activities;Therapeutic exercise;Balance training;Cognitive remediation;Patient/family education   PT Goals (Current goals can be found in the Care Plan section) Acute Rehab PT Goals Patient Stated Goal: pt did not state when asked.   PT Goal Formulation: With patient Time For Goal Achievement: 03/14/15 Potential to Achieve Goals: Good    Frequency Min 3X/week   Barriers to discharge        Co-evaluation               End of Session Equipment Utilized During  Treatment: Gait belt;Oxygen Activity Tolerance: Patient tolerated treatment well Patient left: in chair;with call bell/phone within reach;with chair alarm set Nurse Communication: Mobility status         Time: 1212-1227 PT Time Calculation (min) (ACUTE ONLY): 15 min   Charges:   PT Evaluation $Initial PT Evaluation Tier I: 1 Procedure     PT G CodesCatarina Hartshorn, [place] 03/07/2015, 2:16 PM

## 2015-03-07 NOTE — Progress Notes (Signed)
Azucena Freed, PA at bedside to see pt. Says okay to skip next scheduled doses of Lactulose if pt has more than 4 BMs in 24 hours.

## 2015-03-07 NOTE — Progress Notes (Signed)
  Date: 03/07/2015  Patient name: [name] [name]  Medical record number: [id number]  Date of birth: 09/22/1961   This patient has been seen and the plan of care was discussed with the house staff. Please see their note for complete details. I concur with their findings with the following additions/corrections: [name] was seen on AM rounds with Dr Aundra Dubin. She is alert and oriented. She has mild asterixis and is slightly tremulous at baseline. She c/o anxiety (holding her klonopin). Due to the long half life and her liver dz, she probably still has metabolites hanging around. However, her medical condition is tenuous and I doubt she would tolerate a withdrawal sz well. There is the possibility of benzo making the HE worse but I believe the benefits of resuming low dose benzo are greater than the risks.   Her total bili is a bit higher today. Need to follow to see if just fluctuation or cont trend upward. I am concerned that the bili is not just a refection of her underlying cirrhosis and HE from the UTI. Will recheck bili in AM. Plan now is for outpt MRI but may change if bili cont upwards.  Bartholomew Crews, MD 03/07/2015, 2:28 PM

## 2015-03-08 DIAGNOSIS — N390 Urinary tract infection, site not specified: Secondary | ICD-10-CM

## 2015-03-08 DIAGNOSIS — B9689 Other specified bacterial agents as the cause of diseases classified elsewhere: Secondary | ICD-10-CM

## 2015-03-08 LAB — CBC
HCT: 35.3 % — ABNORMAL LOW (ref 36.0–46.0)
Hemoglobin: 10.9 g/dL — ABNORMAL LOW (ref 12.0–15.0)
MCH: 33.6 pg (ref 26.0–34.0)
MCHC: 30.9 g/dL (ref 30.0–36.0)
MCV: 109 fL — ABNORMAL HIGH (ref 78.0–100.0)
PLATELETS: 104 10*3/uL — AB (ref 150–400)
RBC: 3.24 MIL/uL — ABNORMAL LOW (ref 3.87–5.11)
RDW: 16.6 % — AB (ref 11.5–15.5)
WBC: 5.9 10*3/uL (ref 4.0–10.5)

## 2015-03-08 LAB — MAGNESIUM: MAGNESIUM: 1.8 mg/dL (ref 1.7–2.4)

## 2015-03-08 LAB — COMPREHENSIVE METABOLIC PANEL
ALT: 32 U/L (ref 14–54)
AST: 68 U/L — ABNORMAL HIGH (ref 15–41)
Albumin: 1.7 g/dL — ABNORMAL LOW (ref 3.5–5.0)
Alkaline Phosphatase: 115 U/L (ref 38–126)
Anion gap: 10 (ref 5–15)
BUN: 13 mg/dL (ref 6–20)
CALCIUM: 7.8 mg/dL — AB (ref 8.9–10.3)
CO2: 20 mmol/L — AB (ref 22–32)
CREATININE: 0.91 mg/dL (ref 0.44–1.00)
Chloride: 108 mmol/L (ref 101–111)
GFR calc Af Amer: >60 mL/min (ref >60)
GFR calc non Af Amer: >60 mL/min (ref >60)
Glucose, Bld: 92 mg/dL (ref 70–99)
Potassium: 3.7 mmol/L (ref 3.5–5.1)
Sodium: 138 mmol/L (ref 135–145)
Total Bilirubin: 19.1 mg/dL (ref 0.3–1.2)
Total Protein: 5.1 g/dL — ABNORMAL LOW (ref 6.5–8.1)

## 2015-03-08 LAB — PROTIME-INR
INR: 1.94 — ABNORMAL HIGH (ref 0.00–1.49)
Prothrombin Time: 22.3 seconds — ABNORMAL HIGH (ref 11.6–15.2)

## 2015-03-08 LAB — GLUCOSE, CAPILLARY: GLUCOSE-CAPILLARY: 131 mg/dL — AB (ref 70–99)

## 2015-03-08 LAB — PATHOLOGIST SMEAR REVIEW

## 2015-03-08 MED ORDER — LACTULOSE 10 GM/15ML PO SOLN
20.0000 g | Freq: Every day | ORAL | Status: DC
  Administered 2015-03-09: 20 g via ORAL
  Filled 2015-03-08: qty 30

## 2015-03-08 MED ORDER — LORAZEPAM 0.5 MG PO TABS
0.5000 mg | ORAL_TABLET | Freq: Every day | ORAL | Status: DC
  Administered 2015-03-08: 0.5 mg via ORAL
  Filled 2015-03-08: qty 1

## 2015-03-08 MED ORDER — LORAZEPAM 0.5 MG PO TABS
0.5000 mg | ORAL_TABLET | Freq: Every morning | ORAL | Status: DC
  Administered 2015-03-08 – 2015-03-09 (×2): 0.5 mg via ORAL
  Filled 2015-03-08 (×2): qty 1

## 2015-03-08 NOTE — Progress Notes (Signed)
Daily Rounding Note  03/08/2015, 10:09 AM  LOS: 3 days   SUBJECTIVE:       Due to loose stools has not taken her Lactulose since 5/9.   Eating well.  Walking to BR.  No pain,  No SOB.  Feeling well.    OBJECTIVE:         Vital signs in last 24 hours:    Temp:  [97.4 F (36.3 C)-97.9 F (36.6 C)] 97.4 F (36.3 C) (05/11 0400) Pulse Rate:  [70-78] 75 (05/11 0400) Resp:  [15-18] 16 (05/11 0400) BP: (93-126)/(51-84) 93/51 mmHg (05/11 0400) SpO2:  [91 %-97 %] 91 % (05/11 0400) Weight:  [198 lb 3.1 oz (89.9 kg)-201 lb 1.6 oz (91.218 kg)] 201 lb 1.6 oz (91.218 kg) (05/10 2013) Last BM Date: 03/06/15 Filed Weights   03/05/15 0133 03/07/15 1620 03/07/15 2013  Weight: 199 lb 12.8 oz (90.629 kg) 198 lb 3.1 oz (89.9 kg) 201 lb 1.6 oz (91.218 kg)   General: jaundiced, comfortable.    Heart: RRR Chest: clear bil.  No dyspnea or cough Abdomen: soft, obese, NT, ND.  Active BS  Extremities: minimal, non-pitting pedal edema. Neuro/Psych:  Asterixis resolved.  Oriented x 3.  Slow speech is improved.  Entirely appropriate.   Intake/Output from previous day: 05/10 0701 - 05/11 0700 In: 80 [P.O.:80] Out: 350 [Urine:350]  Intake/Output this shift: Total I/O In: 240 [P.O.:240] Out: -   Lab Results:  Recent Labs  03/06/15 0245 03/07/15 0257 03/08/15 0330  WBC 5.4 7.4 5.9  HGB 11.2* 11.7* 10.9*  HCT 35.5* 37.5 35.3*  PLT 118* 121* 104*   BMET  Recent Labs  03/06/15 0245 03/07/15 0257 03/08/15 0330  NA 142 135 138  K 3.6 3.3* 3.7  CL 114* 110 108  CO2 21* 19* 20*  GLUCOSE 76 174* 92  BUN 10 13 13   CREATININE 1.00 1.13* 0.91  CALCIUM 7.8* 8.0* 7.8*   LFT  Recent Labs  03/06/15 0245 03/07/15 0257 03/08/15 0330  PROT 5.0* 5.4* 5.1*  ALBUMIN 1.9* 1.9* 1.7*  AST 60* 69* 68*  ALT 28 37 32  ALKPHOS 118 124 115  BILITOT 16.9* 18.9* 19.1*   PT/INR  Recent Labs  03/06/15 0245 03/08/15 0330  LABPROT  22.0* 22.3*  INR 1.90* 1.94*    Studies/Results: US Paracentesis  03/06/2015   INDICATION: Ascites, request for diagnostic and therapeutic paracentesis.  EXAM: ULTRASOUND-GUIDED PARACENTESIS  COMPARISON:  None.  MEDICATIONS: None.  COMPLICATIONS: None immediate  TECHNIQUE: Informed written consent was obtained from the patient's daughter after a discussion of the risks, benefits and alternatives to treatment. A timeout was performed prior to the initiation of the procedure.  Initial ultrasound scanning demonstrates a small amount of ascites within the right upper abdominal quadrant. The right upper abdomen was prepped and draped in the usual sterile fashion. 1% lidocaine was used for local anesthesia.  Under direct ultrasound guidance, a 19 gauge, 10-cm, Yueh catheter was introduced. An ultrasound image was saved for documentation purposed. The paracentesis was performed. The catheter was removed and a dressing was applied. The patient tolerated the procedure well without immediate post procedural complication.  FINDINGS: A total of approximately 1.2 liters of serous fluid was removed. Samples were sent to the laboratory as requested by the clinical team.  IMPRESSION: Successful ultrasound-guided paracentesis yielding 1.2 liters of peritoneal fluid.  Read By:  Tsosie Billing PA-C   Electronically Signed   By: Arnell Sieving  Hoss M.D.   On: 03/06/2015 11:37   Scheduled Meds: . cefTRIAXone (ROCEPHIN)  IV  2 g Intravenous Q24H  . furosemide  20 mg Oral Daily  . lactulose  20 g Oral BID  . pneumococcal 23 valent vaccine  0.5 mL Intramuscular Tomorrow-1000  . propranolol  20 mg Oral Daily  . rifaximin  550 mg Oral BID  . spironolactone  25 mg Oral Daily   Continuous Infusions:  PRN Meds:.ondansetron **OR** ondansetron (ZOFRAN) IV, traMADol  ASSESMENT:   * Acute jaundice/hyperbilirubinemia, AMS in pt with Hep C cirrhosis,  Followed by Dr Patsy Baltimore, [facility] hepatology. No visits with DB since 2012. Linzie Collin and  Ribaviran initiated 11/08/2014 (6 month Rx), compliant with Rx per 02/15/15 pharm D note (pill count correct) . Week 2 virus count 50, c/w [id number] in 09/2014 pre Viekira. 08/2014 elastography: metavir F3-F4. Hep A and B immune.  Ultrasound with GB sludge, non-specific GB wall thickening, no flow seen in PV T bili rising after having declined.  Viekira discontinued over concern for precipitating hepatic decompensation.   * Small volume ascites per Ultrasound. s/p 1.2 liter paracentesis 03/06/15: no  SBP. Lasix 20/aldactone 25 mg started today.   * Hx grade 1 esophageal varices, esophagitis and portal gastropathy without gastric varices on 09/2011 EGD  No need for further endoscopy as long as she remains on propranolol per Dr Patsy Baltimore note 12/19/14.   * Hypoalbuminemia.   * Macrocytosis without anemia.   * Thrombocytopenia.   * tox screen + 5/8 for amphetimines, benzos.   * AMS. Ammonia 129 so likely HE. On bid po lactulose but not taking.  Rifaximin added 5/9.  Multiple loose stools. Goal is 2 to 3 BMs per day.  Mental status improved, asterixis resolved may be at baseline.    * Coagulopathy. Upward trend.   * Hx cervical cancer. Ascitic fluid not sent for cytology. 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    PLAN   *  Follow BMET periodically now that she is on new diuretic regimen.   CMET, coags in AM.  Has ROV set for 5/19 with Dr Orvis Brill.   *  Reduced Lactulose to once daily.    [name] [name]  03/08/2015, 10:09 AM Pager: [phone]  [facility] Attending  I have also seen and assessed the patient and agree with the advanced practitioner's assessment and plan. Hx and PE same.  Clinically improved Deep jaundice persists She denies any amphetaine use and so does daughter I explained our working dx of adverse reaction to HCV Tx causing hepatic decompensation. I have discussed w/ Dr. Patsy Baltimore of [facility] also  Stay of [facility] and  ribavirin See him as planned and keep appointments (no show for last one)  We are signing off Please copy Dr. Marty Heck on dc summary

## 2015-03-08 NOTE — Progress Notes (Signed)
Subjective: Patient continues to improve in her mentation, she denies any pain currently, she wants to go home and not to a SNF when she is discharged.  She reports she is still having multiple loose stools. Objective: Vital signs in last 24 hours: Filed Vitals:   03/07/15 2013 03/08/15 0400 03/08/15 1000 03/08/15 1052  BP: 109/59 93/51 96/47    Pulse: 70 75 78 100  Temp: 97.6 F (36.4 C) 97.4 F (36.3 C) 98 F (36.7 C)   TempSrc: Oral Oral Oral   Resp: 18 16 18    Height:      Weight: 201 lb 1.6 oz (91.218 kg)     SpO2: 91% 91% 93% 93%   Weight change:   Intake/Output Summary (Last 24 hours) at 03/08/15 1258 Last data filed at 03/08/15 0854  Gross per 24 hour  Intake    320 ml  Output    100 ml  Net    220 ml   General: resting in bed, jaundiced HEENT: EOMI, scleral icterus Cardiac: RRR, no rubs, murmurs or gallops Pulm: clear to auscultation bilaterally Abd: soft, nontender, mild distension, BS present Ext: warm and well perfused, 1+ pedal edema Neuro: alert and oriented X4 Lab Results: Basic Metabolic Panel:  Recent Labs Lab 03/05/15 0700  03/07/15 0257 03/08/15 0330  NA 140  < > 135 138  K 3.2*  < > 3.3* 3.7  CL 111  < > 110 108  CO2 20*  < > 19* 20*  GLUCOSE 135*  < > 174* 92  BUN 12  < > 13 13  CREATININE 1.05*  < > 1.13* 0.91  CALCIUM 7.4*  < > 8.0* 7.8*  MG 1.7  --   --  1.8  PHOS 3.1  --   --   --   < > = values in this interval not displayed. Liver Function Tests:  Recent Labs Lab 03/07/15 0257 03/08/15 0330  AST 69* 68*  ALT 37 32  ALKPHOS 124 115  BILITOT 18.9* 19.1*  PROT 5.4* 5.1*  ALBUMIN 1.9* 1.7*    Recent Labs Lab 03/04/15 2222  AMMONIA 129*   CBC:  Recent Labs Lab 03/07/15 0257 03/08/15 0330  WBC 7.4 5.9  HGB 11.7* 10.9*  HCT 37.5 35.3*  MCV 107.8* 109.0*  PLT 121* 104*   CBG:  Recent Labs Lab 03/04/15 2124 03/08/15 1224  GLUCAP 122* 131*   Coagulation:  Recent Labs Lab 03/05/15 0053 03/06/15 0245  03/08/15 0330  LABPROT 21.6* 22.0* 22.3*  INR 1.86* 1.90* 1.94*   Anemia Panel: No results for input(s): VITAMINB12, FOLATE, FERRITIN, TIBC, IRON, RETICCTPCT in the last 168 hours. Urine Drug Screen: Drugs of Abuse     Component Value Date/Time   LABOPIA NONE DETECTED 03/05/2015 1830   LABOPIA NEG 07/22/2012 1527   COCAINSCRNUR NONE DETECTED 03/05/2015 1830   COCAINSCRNUR NEG 07/22/2012 1527   LABBENZ POSITIVE* 03/05/2015 1830   LABBENZ PPS 07/22/2012 1527   LABBENZ NEG 05/14/2010 2116   AMPHETMU POSITIVE* 03/05/2015 1830   AMPHETMU NEG 07/22/2012 1527   AMPHETMU NEG 05/14/2010 2116   THCU NONE DETECTED 03/05/2015 1830   THCU NEG 07/22/2012 1527   LABBARB NONE DETECTED 03/05/2015 1830   LABBARB NEG 07/22/2012 1527    Alcohol Level:  Recent Labs Lab 03/05/15 0038  ETH <5   Urinalysis:  Recent Labs Lab 03/05/15 1830  COLORURINE RED*  LABSPEC 1.023  PHURINE 5.5  GLUCOSEU NEGATIVE  HGBUR NEGATIVE  BILIRUBINUR LARGE*  KETONESUR 15*  PROTEINUR NEGATIVE  UROBILINOGEN 1.0  NITRITE POSITIVE*  LEUKOCYTESUR SMALL*     Micro Results: Recent Results (from the past 240 hour(s))  Culture, blood (routine x 2)     Status: None (Preliminary result)   Collection Time: 03/05/15 12:30 AM  Result Value Ref Range Status   Specimen Description BLOOD LEFT ANTECUBITAL  Final   Special Requests BOTTLES DRAWN AEROBIC AND ANAEROBIC 10CC EA  Final   Culture   Final           BLOOD CULTURE RECEIVED NO GROWTH TO DATE CULTURE WILL BE HELD FOR 5 DAYS BEFORE ISSUING A FINAL NEGATIVE REPORT Performed at [facility]    Report Status PENDING  Incomplete  Culture, blood (routine x 2)     Status: None (Preliminary result)   Collection Time: 03/05/15 12:38 AM  Result Value Ref Range Status   Specimen Description BLOOD RIGHT HAND  Final   Special Requests BOTTLES DRAWN AEROBIC AND ANAEROBIC 5CC EA  Final   Culture   Final           BLOOD CULTURE RECEIVED NO GROWTH TO DATE CULTURE  WILL BE HELD FOR 5 DAYS BEFORE ISSUING A FINAL NEGATIVE REPORT Performed at [facility]    Report Status PENDING  Incomplete  MRSA PCR Screening     Status: None   Collection Time: 03/05/15  2:44 AM  Result Value Ref Range Status   MRSA by PCR NEGATIVE NEGATIVE Final    Comment:        The GeneXpert MRSA Assay (FDA approved for NASAL specimens only), is one component of a comprehensive MRSA colonization surveillance program. It is not intended to diagnose MRSA infection nor to guide or monitor treatment for MRSA infections.   Body fluid culture     Status: None (Preliminary result)   Collection Time: 03/06/15 10:58 AM  Result Value Ref Range Status   Specimen Description FLUID PERITONEAL  Final   Special Requests NONE  Final   Gram Stain   Final    RARE WBC PRESENT, PREDOMINANTLY MONONUCLEAR NO ORGANISMS SEEN Performed at [facility]    Culture   Final    NO GROWTH 1 DAY Performed at [facility]    Report Status PENDING  Incomplete   Studies/Results: No results found. Medications: I have reviewed the patient's current medications. Scheduled Meds: . furosemide  20 mg Oral Daily  . [START ON 03/09/2015] lactulose  20 g Oral Daily  . LORazepam  0.5 mg Oral q morning - 10a  . LORazepam  0.5 mg Oral QHS  . pneumococcal 23 valent vaccine  0.5 mL Intramuscular Tomorrow-1000  . propranolol  20 mg Oral Daily  . rifaximin  550 mg Oral BID  . spironolactone  25 mg Oral Daily   Continuous Infusions:  PRN Meds:.ondansetron **OR** ondansetron (ZOFRAN) IV Assessment/Plan:  Hyperbilirubinemia - T Bili up to 24 on admission has trended down to 16 but has been increasing last 2 days. - GI concerned that her HCV Tx may have caused her decompensation and we are holding Viekira/Ribaviran - UTI has been treated - Check CMP in AM   Hepatic encephalopathy - Nearly resolved, patient's mental status greatly improved. - Decreased lactulose to once a day -  Continue Xifaxan   Chronic hepatitis C with cirrhosis - Follows with Dr Patsy [facility] at [facility] up 5/19 - Low dose lasix and spironolactone added - Holding Viekira/Ribaviran as may have cause decompensation - Continue Inderal and  PPI -Current plan is for outpatient MRI to evaluate for [facility]   Esophageal varices in cirrhosis - Stable   UTI (urinary tract infection) - Uncomplicated - Can D/C Ceftriaxone   Anxiety/ Chronic BZD use - Dr Lynnae January preformed [place] [employer] database search showing that Ms [place] has been feeling both Ativan and Klonopin recently. I feel that Klonopin may not be the best choice given her deterioring liver disease. - Will restarted Ativan 0.5mg  BID and monitor closely. - Will discuss with PCP and Dr. Casimiro Needle (Psych)  Dispo: Disposition is deferred at this time, awaiting improvement of current medical problems.  Anticipated discharge in approximately 1 day(s).   The patient does have a current PCP Milagros Loll, MD) and does need an [facility] hospital follow-up appointment after discharge.  The patient does not have transportation limitations that hinder transportation to clinic appointments.  .Services Needed at time of discharge: Y = Yes, Blank = No PT:   OT:   RN:   Equipment:   Other:     LOS: 3 days   Lucious Groves, DO 03/08/2015, 12:58 PM

## 2015-03-08 NOTE — Progress Notes (Signed)
Subjective: No acute events overnight. Patient is fully oriented this morning, appearing markedly less confused. Appetite is okay. Denies pain. Continues to endorse many loose stools. Patient did not take lactulose yesterday given frequency of BMs. She received tramadol x1 for generalized pain.  Objective: Vital signs in last 24 hours: Filed Vitals:   03/07/15 1620 03/07/15 1623 03/07/15 2013 03/08/15 0400  BP:  116/59 109/59 93/51  Pulse:  73 70 75  Temp:   97.6 F (36.4 C) 97.4 F (36.3 C)  TempSrc:   Oral Oral  Resp:   18 16  Height:      Weight: 89.9 kg (198 lb 3.1 oz)  91.218 kg (201 lb 1.6 oz)   SpO2:  97% 91% 91%   Weight change:   Intake/Output Summary (Last 24 hours) at 03/08/15 0856 Last data filed at 03/08/15 0854  Gross per 24 hour  Intake    320 ml  Output    350 ml  Net    -30 ml   Physical Exam: General: sitting up in bed eating, NAD, jaundiced throughout body Eyes: Scleral icterus present  Cardio: RRR, nl S1 and S2, no murmurs, rubs, or gallops Pulm: CTAB, nl WOB Abdomen: soft, nontender, mildly distended Extremities: 1+ pitting edema in BLEs.  Neurological: Oriented to self, place, city, date, and circumstance  Lab Results: Basic Metabolic Panel:  Recent Labs Lab 03/05/15 0700  03/07/15 0257 03/08/15 0330  NA 140  < > 135 138  K 3.2*  < > 3.3* 3.7  CL 111  < > 110 108  CO2 20*  < > 19* 20*  GLUCOSE 135*  < > 174* 92  BUN 12  < > 13 13  CREATININE 1.05*  < > 1.13* 0.91  CALCIUM 7.4*  < > 8.0* 7.8*  MG 1.7  --   --  1.8  PHOS 3.1  --   --   --   < > = values in this interval not displayed.   Liver Function Tests:  Recent Labs Lab 03/07/15 0257 03/08/15 0330  AST 69* 68*  ALT 37 32  ALKPHOS 124 115  BILITOT 18.9* 19.1*  PROT 5.4* 5.1*  ALBUMIN 1.9* 1.7*    CBC:  Recent Labs Lab 03/07/15 0257 03/08/15 0330  WBC 7.4 5.9  HGB 11.7* 10.9*  HCT 37.5 35.3*  MCV 107.8* 109.0*  PLT 121* 104*   Coagulation:  Recent Labs Lab  03/05/15 0053 03/06/15 0245 03/08/15 0330  LABPROT 21.6* 22.0* 22.3*  INR 1.86* 1.90* 1.94*   Micro Results: Recent Results (from the past 240 hour(s))  Culture, blood (routine x 2)     Status: None (Preliminary result)   Collection Time: 03/05/15 12:30 AM  Result Value Ref Range Status   Specimen Description BLOOD LEFT ANTECUBITAL  Final   Special Requests BOTTLES DRAWN AEROBIC AND ANAEROBIC 10CC EA  Final   Culture   Final           BLOOD CULTURE RECEIVED NO GROWTH TO DATE CULTURE WILL BE HELD FOR 5 DAYS BEFORE ISSUING A FINAL NEGATIVE REPORT Performed at [facility]    Report Status PENDING  Incomplete  Culture, blood (routine x 2)     Status: None (Preliminary result)   Collection Time: 03/05/15 12:38 AM  Result Value Ref Range Status   Specimen Description BLOOD RIGHT HAND  Final   Special Requests BOTTLES DRAWN AEROBIC AND ANAEROBIC 5CC EA  Final   Culture   Final  BLOOD CULTURE RECEIVED NO GROWTH TO DATE CULTURE WILL BE HELD FOR 5 DAYS BEFORE ISSUING A FINAL NEGATIVE REPORT Performed at [facility]    Report Status PENDING  Incomplete  MRSA PCR Screening     Status: None   Collection Time: 03/05/15  2:44 AM  Result Value Ref Range Status   MRSA by PCR NEGATIVE NEGATIVE Final    Comment:        The GeneXpert MRSA Assay (FDA approved for NASAL specimens only), is one component of a comprehensive MRSA colonization surveillance program. It is not intended to diagnose MRSA infection nor to guide or monitor treatment for MRSA infections.   Body fluid culture     Status: None (Preliminary result)   Collection Time: 03/06/15 10:58 AM  Result Value Ref Range Status   Specimen Description FLUID PERITONEAL  Final   Special Requests NONE  Final   Gram Stain   Final    RARE WBC PRESENT, PREDOMINANTLY MONONUCLEAR NO ORGANISMS SEEN Performed at [facility]    Culture NO GROWTH Performed at [facility]   Final   Report  Status PENDING  Incomplete   Studies/Results: No new studies.  Medications: I have reviewed the patient's current medications. Scheduled Meds: . cefTRIAXone (ROCEPHIN)  IV  2 g Intravenous Q24H  . furosemide  20 mg Oral Daily  . lactulose  20 g Oral BID  . pneumococcal 23 valent vaccine  0.5 mL Intramuscular Tomorrow-1000  . propranolol  20 mg Oral Daily  . rifaximin  550 mg Oral BID  . 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 Mental status improved. Likely discharge tomorrow with close outpatient follow-up.  Acute Hyperbilirubinemia: TBili elevated to 19.1 today. TBili of 0.2 in 01/2015. Abdominal ultrasound shows no flow in portal vein, suggesting possible portal vein thrombosis or significantly reduced flow due to portal venous hypertension. DDx also includes medications, end stage hepatic disease (al though AST/ALT not elevated significantly and her LFT was normal on 01/2015), infection, or paraneoplastic syndrome from malignancy causing cholestatics (common from gynecologic malignancy, renal cell carcinoma, etc.). Patient remains afebrile and is no longer complaining of suprapubic tenderness, however U/A c/w UTI so treated with ceftriaxone. MELD score 27 (19.6% 3 month mortality). Blood culture shows NGTD. Ascitic fluid shows NGTD.  Given persistent severe hyperbilirubinemia, will pursue additional outpatient imaging to r/o alternatives etiologies ([place], portal vein thrombosis) and trend TBili in outpatient setting. - GI consulted: appreciate their recommendations - daily CMP - f/u blood, ascitic cultures - f/u anti-mitochondrial antibody - ROV with hepatologist Dr. Patsy Baltimore on 5/19 - outpatient MRI to r/o [facility] and portal vein thrombosis  Hepatic Encephalopathy: Likely 2/2 to hepatic encephalopathy or 2/2 to severe hyperbilirubinemia. Could also be 2/2 to her psych meds in the setting of liver failure leading to drug accumulation. Ammonia 129. Alcohol and tylenol level negative. UDS  positive for benzodiazapines and amphetamines. Fully oriented today. - oral lactuloseonce daily - PT/OT recommend home health PT  Chronic Hepatitis C and Alcohol-Induced Cirrhosis w/ Esophageal Varices: Liver bx 04/2008 showed grade 2 stage II-III disease, mixed micro and macro steatosis consistent with serohepatitis in addition to Hep C. AFP elevated to 54.6 in 2013.She was started on Viekira [name]/Ribavirin for 24 weeks on 11/08/2014 by [place]. Per hepatology note from 12/19/2014, she is not on the transplant list as her MELD score was 11 previously and she is a poor candidate for transplant because of her psychiatric co-morbidites. Followed by Dr. Olevia Perches of [facility]. Discussed MRI to r/o [facility] given elevated AFP in 2013 with Dr. Ardis Hughs who recommended f/u and possible outpatient MR within the next month. - GI consulted: continue Xifaxan, continue lasix and aldactone, hold Ribaviran as can cause hepatic decompensation (?continue Viekira) - continue Inderal - ROV with hepatologist Dr. Patsy Baltimore on 5/19 - outpatient MRI to r/o [facility] and portal vein thrombosis  UTI (resolved): U/A consistent with UTI. Patient presented with dysuria and suprapubic pain. No longer reports suprapubic pain. Completed four days of ceftriaxone. - discontinue ceftriaxone  AKI  (resolved): Baseline SCr ~0.5, now 0.91. Likely 2/2 to low PO intake. - continue home lasix  Chronic Pain: On tramadol 0.5mg  q4 h prn at home. Although patient has not demonstrated benzodiazapine withdrawal, will avoid tramadol given its potential to lower the seizure threshold - discontinue tramadol  Anxiety/depression - On remeron 45mg  QHS, klonopin 1mg  TID at home. Patient recently filled old prescription for Ativan (1mg  in AM, 0.5mg  qhs). Will notify primary psychiatrist of inappropriate benzodiazapine usage. - scheduled ativan - hold home remeron and klonopin - contact psychiatrist Dr. Casimiro Needle re: klonopin and ativan usage  COPD: No acute exacerbation. - hold home albuterol, combivent, flonase, proair. Resume as needed.  GERD  - continue PPI  FEN/GI: Diet: Regular DVT PPx: SCDs  Dispo: Disposition is deferred at this time, awaiting improvement of current medical problems. Anticipated discharge in approximately 1 day(s).   The patient does have a current PCP Milagros Loll, MD) and does need an [facility] hospital follow-up appointment after discharge.  The patient does have transportation limitations that hinder transportation to clinic appointments.  This is a Careers information officer Note.  The care of the patient was discussed with Dr. Larey Dresser and the assessment and plan formulated with their assistance.  Please see their attached note for official documentation of the daily encounter.   LOS: 3 days   Reuben Likes, Med Student 03/08/2015, 8:56 AM

## 2015-03-08 NOTE — Care Management Note (Addendum)
Case Management Note  Patient Details  Name: [name] [name] MRN: [id number] Date of Birth: Mar 28, 1961  Subjective/Objective:                 CM following for progression and d/c planning.   Action/Plan:  Met with pt daughter, who request a few visits from [facility], however pt insurance will not cover HHPT. Given that the pt walked 260 ft without any assist device and is ambulatory in her room, the daughter is comfortable in handing mobility needs. Expected Discharge Date:  03/08/2015             Expected Discharge Plan:  [place]  In-House Referral:  NA  Discharge planning Services  CM Consult  Post Acute Care Choice:  NA Choice offered to:  Adult Children  DME Arranged:    DME Agency:     HH Arranged:  RN Blunt Agency:  [place]  Status of Service:  Completed, signed off  Medicare Important Message Given:  No Date Medicare IM Given:    Medicare IM give by:    Date Additional Medicare IM Given:    Additional Medicare Important Message give by:     If discussed at [place] of Stay Meetings, dates discussed:    Additional Comments:  Adron Bene, RN 03/08/2015, 3:21 PM

## 2015-03-08 NOTE — Progress Notes (Addendum)
Physical Therapy Treatment Patient Details Name: [name] [name] MRN: [id number] DOB: [birth year] Today's Date: 03/08/2015    History of Present Illness pt presents with Hepatic Encephalopathy, UTI, and AMS.  pt with hx of chronic Hepatitis C and Cirrhosis.      PT Comments    Pt is progressing well with mobility. She walked 260' without an assistive device, no loss of balance, SaO2 93% on RA and HR 100 with walking. She was steady with higher level balance activities.  Pt stated her daughter will be home 24*/day with her. PT now recommending HHPT rather than SNF as pt has improved with mobility and mental status.   Follow Up Recommendations  Home health PT (pt has improved with mobility, states her daughter will be home with her 24*/day)     Equipment Recommendations  None recommended by PT    Recommendations for Other Services       Precautions / Restrictions Precautions Precautions: Fall Precaution Comments: pt reports she had a fall at home just prior to admission, she was disoriented at the time, she denies h/o of other falls in the past year Restrictions Weight Bearing Restrictions: No    Mobility  Bed Mobility               General bed mobility comments: NT- up in recliner  Transfers Overall transfer level: Modified independent Equipment used: None Transfers: Sit to/from Stand Sit to Stand: Modified independent (Device/Increase time)         General transfer comment: pushed up from armrests  Ambulation/Gait Ambulation/Gait assistance: Min guard;Supervision Ambulation Distance (Feet): 260 Feet Assistive device: None Gait Pattern/deviations: Step-through pattern;Decreased stride length   Gait velocity interpretation: at or above normal speed for age/gender General Gait Details: steady without AD   Stairs            Wheelchair Mobility    Modified Rankin (Stroke Patients Only)       Balance     Sitting balance-Leahy Scale: Good        Standing balance-Leahy Scale: Good (pt able to stand with feet together, stand with eyes closed for 10 seconds, reach to floor, reach foward over BOS, and turn 360* without LOB, able to hold tandem stance for a few seconds)                      Cognition Arousal/Alertness: Awake/alert Behavior During Therapy: WFL for tasks assessed/performed Overall Cognitive Status: Within Functional Limits for tasks assessed                      Exercises      General Comments        Pertinent Vitals/Pain Pain Assessment: No/denies pain    Home Living                      Prior Function            PT Goals (current goals can now be found in the care plan section) Acute Rehab PT Goals Patient Stated Goal: to be able to get groceries (uses WC at grocery store due to back pain) PT Goal Formulation: With patient Time For Goal Achievement: 03/14/15 Potential to Achieve Goals: Good Progress towards PT goals: Progressing toward goals    Frequency  Min 3X/week    PT Plan Discharge plan needs to be updated    Co-evaluation             End  of Session Equipment Utilized During Treatment: Gait belt Activity Tolerance: Patient tolerated treatment well Patient left: in chair;with call bell/phone within reach     Time: 1029-1050 PT Time Calculation (min) (ACUTE ONLY): 21 min  Charges:  $Gait Training: 8-22 mins                    G Codes:      Philomena Doheny 03/08/2015, 10:59 AM [phone]

## 2015-03-09 ENCOUNTER — Other Ambulatory Visit: Payer: Self-pay | Admitting: Internal Medicine

## 2015-03-09 DIAGNOSIS — F419 Anxiety disorder, unspecified: Secondary | ICD-10-CM

## 2015-03-09 LAB — COMPREHENSIVE METABOLIC PANEL
ALBUMIN: 1.7 g/dL — AB (ref 3.5–5.0)
ALT: 34 U/L (ref 14–54)
ANION GAP: 5 (ref 5–15)
AST: 67 U/L — ABNORMAL HIGH (ref 15–41)
Alkaline Phosphatase: 144 U/L — ABNORMAL HIGH (ref 38–126)
BUN: 14 mg/dL (ref 6–20)
CALCIUM: 7.7 mg/dL — AB (ref 8.9–10.3)
CHLORIDE: 108 mmol/L (ref 101–111)
CO2: 21 mmol/L — ABNORMAL LOW (ref 22–32)
CREATININE: 1.14 mg/dL — AB (ref 0.44–1.00)
GFR calc Af Amer: >60 mL/min (ref >60)
GFR, EST NON AFRICAN AMERICAN: 53 mL/min — AB (ref >60)
Glucose, Bld: 141 mg/dL — ABNORMAL HIGH (ref 65–99)
Potassium: 3.4 mmol/L — ABNORMAL LOW (ref 3.5–5.1)
Sodium: 134 mmol/L — ABNORMAL LOW (ref 135–145)
Total Bilirubin: 19.1 mg/dL (ref 0.3–1.2)
Total Protein: 4.9 g/dL — ABNORMAL LOW (ref 6.5–8.1)

## 2015-03-09 LAB — PROTIME-INR
INR: 1.87 — ABNORMAL HIGH (ref 0.00–1.49)
PROTHROMBIN TIME: 21.7 s — AB (ref 11.6–15.2)

## 2015-03-09 MED ORDER — RIFAXIMIN 550 MG PO TABS: 550.0000 mg | ORAL_TABLET | Freq: Two times a day (BID) | ORAL | Status: DC

## 2015-03-09 MED ORDER — LORAZEPAM 0.5 MG PO TABS: 0.5000 mg | ORAL_TABLET | Freq: Every morning | ORAL | Status: DC

## 2015-03-09 MED ORDER — LACTULOSE 10 GM/15ML PO SOLN: ORAL | Status: DC

## 2015-03-09 MED ORDER — SPIRONOLACTONE 25 MG PO TABS: 25.0000 mg | ORAL_TABLET | Freq: Every day | ORAL | Status: DC

## 2015-03-09 NOTE — Progress Notes (Signed)
Patient discharge teaching given, including activity, diet, follow-up appoints, and medications. Patient verbalized understanding of all discharge instructions. IV access was d/c'd. Vitals are stable. Skin is intact except as charted in most recent assessments. Pt to be escorted out by NT, to be driven home by family. Melissa, the daughter, voiced concerns in regards to the pt's insurance not covering the pt's rifaximin prescription and not being able to afford to pay for it. I assured her the importance for the pt to take this medication and to check with the pharmacist at [facility] for resources and/or alternatives to pay for the medication.  Alexia Freestone, RN

## 2015-03-09 NOTE — Progress Notes (Signed)
Occupational Therapy Treatment Patient Details Name: [name] [name] MRN: [id number] DOB: August 19, 1961 Today's Date: 03/09/2015    History of present illness pt presents with Hepatic Encephalopathy, UTI, and AMS.  pt with hx of chronic Hepatitis C and Cirrhosis.     OT comments  Pt with decreased awareness of deficits and safety. Mild balance deficits.  Pt's 2 daughters in room and while pt was in the bathroom stated that they could not care for pt 24 hours, although pt has been telling staff that she would have constant supervision. Daughter's want pt to go to a SNF from the hospital, but do not want to tell the pt and upset her.  Would like to speak to the doctor about their concerns, relayed this information to medical team and to RN.  Follow Up Recommendations  SNF;Supervision/Assistance - 24 hour    Equipment Recommendations       Recommendations for Other Services      Precautions / Restrictions Precautions Precautions: Fall Precaution Comments: instructed pt to sit EOB and then stand briefly prior to ambulating Restrictions Weight Bearing Restrictions: No       Mobility Bed Mobility Overal bed mobility: Modified Independent                Transfers Overall transfer level: Needs assistance   Transfers: Sit to/from Stand Sit to Stand: Supervision         General transfer comment: cues to stand briefly prior to ambulating    Balance   Sitting-balance support: Feet supported Sitting balance-Leahy Scale: Good       Standing balance-Leahy Scale: Good                     ADL Overall ADL's : Needs assistance/impaired Eating/Feeding: Independent;Sitting   Grooming: Wash/dry hands;Supervision/safety;Standing           Upper Body Dressing : Set up;Supervision/safety;Sitting   Lower Body Dressing: Supervision/safety;Sit to/from stand   Toilet Transfer: Supervision/safety;Ambulation;Regular Toilet   Toileting- Clothing Manipulation and Hygiene:  Modified independent;Sit to/from stand       Functional mobility during ADLs: Min guard General ADL Comments: Pt is a fall risk, but repeatedly not calling nursing for assistance.      Vision                     Perception     Praxis      Cognition   Behavior During Therapy: Impulsive Overall Cognitive Status: Impaired/Different from baseline Area of Impairment: Safety/judgement;Problem solving;Memory     Memory: Decreased short-term memory    Safety/Judgement: Decreased awareness of safety;Decreased awareness of deficits   Problem Solving: Slow processing;Requires tactile cues;Requires verbal cues General Comments: Concerns with home safety, specifically responding to emergencies, managing medication, avoiding driving.    Extremity/Trunk Assessment               Exercises     Shoulder Instructions       General Comments      Pertinent Vitals/ Pain       Pain Assessment: No/denies pain  Home Living                                          Prior Functioning/Environment              Frequency Min 2X/week     Progress Toward Goals  OT Goals(current  goals can now be found in the care plan section)  Progress towards OT goals: Progressing toward goals  Acute Rehab OT Goals Patient Stated Goal: return home with daughter's assist  Plan Discharge plan remains appropriate    Co-evaluation                 End of Session     Activity Tolerance Patient tolerated treatment well   Patient Left in bed;with call bell/phone within reach;with bed alarm set;with family/visitor present   Nurse Communication Other (comment) (family cannot provide 24 hour care)        Time: 1234-1300 OT Time Calculation (min): 26 min  Charges: OT General Charges $OT Visit: 1 Procedure OT Treatments $Self Care/Home Management : 23-37 mins  Malka So 03/09/2015, 1:11 PM  [phone]

## 2015-03-09 NOTE — Progress Notes (Signed)
Subjective: No acute events overnight. Patient is fully oriented this morning. Appetite is okay. Denies any pain or anxiety. Patient has not received lactulose x2 days given frequent BMs. Patient confirms that she is on Klonopin 1mg  TID and that she's taken ativan because her doctor wanted to see which suited her better. Patient states that she should have some ativan at home. Stressed importance of avoiding klonopin and using ativan for anxiety at home given potential for hepatic injury. Patient voiced comprehension, but would be helpful to speak with daughter as well.  Objective: Vital signs in last 24 hours: Filed Vitals:   03/08/15 1657 03/08/15 2033 03/09/15 0414 03/09/15 0949  BP: 99/56 96/57 102/48 102/62  Pulse: 76 73 80 80  Temp: 97.8 F (36.6 C) 98.6 F (37 C) 98.5 F (36.9 C) 98 F (36.7 C)  TempSrc: Oral Oral Oral Oral  Resp: 17 16 18 17   Height:      Weight:  95.301 kg (210 lb 1.6 oz)    SpO2: 93% 99% 92% 94%   Weight change: 5.401 kg (11 lb 14.5 oz)  Intake/Output Summary (Last 24 hours) at 03/09/15 1009 Last data filed at 03/09/15 0900  Gross per 24 hour  Intake   1010 ml  Output      0 ml  Net   1010 ml   Physical Exam: General: sitting up in bed eating, NAD, jaundiced throughout body Eyes: Scleral icterus present  Cardio: RRR, nl S1 and S2, no murmurs, rubs, or gallops Pulm: CTAB, nl WOB Abdomen: soft, nontender, mildly distended Extremities: 1+ pitting edema in BLEs.  Neurological: Oriented to self, place, city, date, and circumstance  Lab Results: Basic Metabolic Panel:  Recent Labs Lab 03/05/15 0700  03/08/15 0330 03/09/15 0601  NA 140  < > 138 134*  K 3.2*  < > 3.7 3.4*  CL 111  < > 108 108  CO2 20*  < > 20* 21*  GLUCOSE 135*  < > 92 141*  BUN 12  < > 13 14  CREATININE 1.05*  < > 0.91 1.14*  CALCIUM 7.4*  < > 7.8* 7.7*  MG 1.7  --  1.8  --   PHOS 3.1  --   --   --   < > = values in this interval not displayed.   Liver Function  Tests:  Recent Labs Lab 03/08/15 0330 03/09/15 0601  AST 68* 67*  ALT 32 34  ALKPHOS 115 144*  BILITOT 19.1* 19.1*  PROT 5.1* 4.9*  ALBUMIN 1.7* 1.7*   CBC:  Recent Labs Lab 03/07/15 0257 03/08/15 0330  WBC 7.4 5.9  HGB 11.7* 10.9*  HCT 37.5 35.3*  MCV 107.8* 109.0*  PLT 121* 104*   CBG:  Recent Labs Lab 03/04/15 2124 03/08/15 1224  GLUCAP 122* 131*   Coagulation:  Recent Labs Lab 03/05/15 0053 03/06/15 0245 03/08/15 0330 03/09/15 0601  LABPROT 21.6* 22.0* 22.3* 21.7*  INR 1.86* 1.90* 1.94* 1.87*   Micro Results: Recent Results (from the past 240 hour(s))  Culture, blood (routine x 2)     Status: None (Preliminary result)   Collection Time: 03/05/15 12:30 AM  Result Value Ref Range Status   Specimen Description BLOOD LEFT ANTECUBITAL  Final   Special Requests BOTTLES DRAWN AEROBIC AND ANAEROBIC 10CC EA  Final   Culture   Final           BLOOD CULTURE RECEIVED NO GROWTH TO DATE CULTURE WILL BE HELD FOR 5 DAYS BEFORE ISSUING  A FINAL NEGATIVE REPORT Performed at [facility]    Report Status PENDING  Incomplete  Culture, blood (routine x 2)     Status: None (Preliminary result)   Collection Time: 03/05/15 12:38 AM  Result Value Ref Range Status   Specimen Description BLOOD RIGHT HAND  Final   Special Requests BOTTLES DRAWN AEROBIC AND ANAEROBIC 5CC EA  Final   Culture   Final           BLOOD CULTURE RECEIVED NO GROWTH TO DATE CULTURE WILL BE HELD FOR 5 DAYS BEFORE ISSUING A FINAL NEGATIVE REPORT Performed at [facility]    Report Status PENDING  Incomplete  MRSA PCR Screening     Status: None   Collection Time: 03/05/15  2:44 AM  Result Value Ref Range Status   MRSA by PCR NEGATIVE NEGATIVE Final    Comment:        The GeneXpert MRSA Assay (FDA approved for NASAL specimens only), is one component of a comprehensive MRSA colonization surveillance program. It is not intended to diagnose MRSA infection nor to guide or monitor  treatment for MRSA infections.   Body fluid culture     Status: None (Preliminary result)   Collection Time: 03/06/15 10:58 AM  Result Value Ref Range Status   Specimen Description FLUID PERITONEAL  Final   Special Requests NONE  Final   Gram Stain   Final    RARE WBC PRESENT, PREDOMINANTLY MONONUCLEAR NO ORGANISMS SEEN Performed at [facility]    Culture   Final    NO GROWTH 1 DAY Performed at [facility]    Report Status PENDING  Incomplete   Studies/Results: No results found.   Medications: I have reviewed the patient's current medications. Scheduled Meds: . furosemide  20 mg Oral Daily  . lactulose  20 g Oral Daily  . LORazepam  0.5 mg Oral q morning - 10a  . LORazepam  0.5 mg Oral QHS  . pneumococcal 23 valent vaccine  0.5 mL Intramuscular Tomorrow-1000  . propranolol  20 mg Oral Daily  . rifaximin  550 mg Oral BID  . spironolactone  25 mg Oral Daily   Continuous Infusions:  PRN Meds:.ondansetron **OR** ondansetron (ZOFRAN) IV   Assessment/Plan: Principal Problem:   Hepatic encephalopathy Active Problems:   Chronic hepatitis C with cirrhosis - genotype 1a   Esophageal varices in cirrhosis   Altered mental status   UTI (urinary tract infection)   Hyperbilirubinemia   Cirrhosis of liver   Encephalopathy   Decompensated hepatic cirrhosis   Adverse drug effect - Vernie Shanks Pak   Temprance Wyre is a 42 yoF with hx of Hepatitis C, alcohol-induced cirrhosis and hepatic encephalopathy here with hepatic encephalopathy and acute hyperbilirubinemia, possibly 2/2 to portal vein obstruction, medications, or UTI. Mental status improved. Likely discharge today with close outpatient follow-up.  Acute Hyperbilirubinemia: TBili remains elevated to 19.1 today. TBili of 0.2 in 01/2015. Abdominal ultrasound shows no flow in portal vein, suggesting possible portal vein thrombosis or significantly reduced flow due to portal venous hypertension. DDx also includes  medications, end stage hepatic disease (al though AST/ALT not elevated significantly and her LFT was normal on 01/2015), infection, or paraneoplastic syndrome from malignancy causing cholestatics (common from gynecologic malignancy, renal cell carcinoma, etc.). Patient remains afebrile and is no longer complaining of suprapubic tenderness, however U/A c/w UTI so treated with ceftriaxone. MELD score 27 (19.6% 3 month mortality). Blood culture shows NGTD. Ascitic fluid shows NGTD. Given  persistent severe hyperbilirubinemia, will pursue additional outpatient imaging to r/o alternatives etiologies (Ronald, portal vein thrombosis) and trend TBili in outpatient setting. - GI consulted: appreciate their recommendations - f/u blood, ascitic cultures - f/u anti-mitochondrial antibody - ROV with hepatologist Dr. Patsy Baltimore on 5/19 - outpatient MRI to r/o [facility] and portal vein thrombosis  Hepatic Encephalopathy: Likely 2/2 to hepatic encephalopathy or 2/2 to severe hyperbilirubinemia. Could also be 2/2 to her psych meds in the setting of liver failure leading to drug accumulation. Ammonia 129. Alcohol and tylenol level negative. UDS positive for benzodiazapines and amphetamines. Fully oriented today. PT/OT recommend home health PT, however insurance will not cover. Daughter comfortable with assisting patient at home. - oral lactuloseonce daily  Chronic Hepatitis C and Alcohol-Induced Cirrhosis w/ Esophageal Varices: Liver bx 04/2008 showed grade 2 stage II-III disease, mixed micro and macro steatosis consistent with serohepatitis in addition to Hep C. AFP elevated to 54.6 in 2013.She was started on Viekira Pak/Ribavirin for 24 weeks on 11/08/2014 by [facility]. Per hepatology note from 12/19/2014, she is not on the transplant list as her MELD score was 11 previously and she is a poor candidate for transplant because of her psychiatric co-morbidites. Followed by Dr. Olevia Perches of [facility]. Discussed MRI to r/o [facility] given elevated  AFP in 2013 with Dr. Ardis Hughs who recommended f/u and possible outpatient MR within the next month. - GI consulted: continue Xifaxan, continue lasix and aldactone, hold Ribaviran/Viekira as can cause hepatic decompensation - continue Inderal - ROV with hepatologist Dr. Patsy Baltimore on 5/19 - outpatient MRI to r/o [facility] and portal vein thrombosis  UTI (resolved): U/A consistent with UTI. Patient presented with dysuria and suprapubic pain. No longer reports suprapubic pain. Completed four days of ceftriaxone.  AKI (resolved): Baseline SCr ~0.5, now 0.91. Likely 2/2 to low PO intake. - continue home lasix  Chronic Pain: On tramadol 0.5mg  q4 h prn at home. Although patient has not demonstrated benzodiazapine withdrawal, will avoid tramadol given its potential to lower the seizure threshold  Anxiety/depression - On remeron 45mg  QHS, klonopin 1mg  TID at home. Patient recently filled old prescription for Ativan (1mg  in AM, 0.5mg  qhs). Primary psychiatrist notified of inappropriate benzodiazapine use. - scheduled ativan - hold home remeron and klonopin - patient advised to avoid remeron and klonopin at home; use remaining ativan 0.5mg  BID for anxiety  COPD: No acute exacerbation. - hold home albuterol, combivent, flonase, proair. Resume as needed.  GERD  - continue PPI  FEN/GI: Diet: Regular DVT PPx: SCDs Code: DNR  Dispo: Disposition is deferred at this time, awaiting improvement of current medical problems. Anticipated discharge in approximately 1 day(s).   The patient does have a current PCP Milagros Loll, MD) and does need an [facility] hospital follow-up appointment after discharge.  The patient does have transportation limitations that hinder transportation to clinic appointments.  This is a Careers information officer Note.  The care of the patient was discussed with Dr. Joni Reining and the assessment and plan formulated with their assistance.  Please see their attached note for official documentation of  the daily encounter.   LOS: 4 days   Reuben Likes, Med Student 03/09/2015, 10:09 AM

## 2015-03-09 NOTE — Discharge Instructions (Signed)
I want you to keep your appointments with Dr Patsy Baltimore and Dr. Randell Patient. Please review this medication list carefully. As we discussed we have made a number of changes, especially adding two medications (Spironolactone and Xifaxin) and stopping a number of your medications that are cleared by the liver.  Please call the clinic at [phone] if you have any questions.

## 2015-03-09 NOTE — Discharge Summary (Signed)
Name: [name] [name] MRN: [id number] DOB: 28-Aug-1961 54 y.o. PCP: Milagros Loll, MD  Date of Admission: 03/04/2015  9:14 PM Date of Discharge: 03/10/2015 Attending Physician: Dr Lynnae January  Discharge Diagnosis: Principal Problem:   Hepatic encephalopathy Active Problems:   Chronic hepatitis C with cirrhosis - genotype 1a   Esophageal varices in cirrhosis   Altered mental status   UTI (urinary tract infection)   Hyperbilirubinemia   Cirrhosis of liver   Encephalopathy   Decompensated hepatic cirrhosis   Adverse drug effect - [place]  Discharge Medications:   Medication List    STOP taking these medications        clonazePAM 1 MG tablet  Commonly known as:  KLONOPIN     mirtazapine 45 MG tablet  Commonly known as:  REMERON     MOVIPREP 100 G Solr  Generic drug:  peg 3350 powder     [place]-Paritapre-Ritona-Dasab 12.5-75-50 &250 MG Tbpk     polyethylene glycol powder powder  Commonly known as:  GLYCOLAX/MIRALAX     ranitidine 150 MG tablet  Commonly known as:  ZANTAC     ribavirin 200 MG tablet  Commonly known as:  COPEGUS     traZODone 100 MG tablet  Commonly known as:  DESYREL      TAKE these medications        albuterol (2.5 MG/3ML) 0.083% nebulizer solution  Commonly known as:  PROVENTIL  Take 2.5 mg by nebulization 2 (two) times daily.     PROAIR HFA 108 (90 BASE) MCG/ACT inhaler  Generic drug:  albuterol  INHALE TWO   PUFFS INTO THE LUNGS EVERY 6 HOURS AS NEEDED FOR WHEEZING.     COMBIVENT RESPIMAT 20-100 MCG/ACT Aers respimat  Generic drug:  Ipratropium-Albuterol  INHALE 1 PUFF INTO THE LUNGS EVERY 6 (SIX) HOURS AS NEEDED FOR WHEEZING.     fluconazole 100 MG tablet  Commonly known as:  DIFLUCAN  Take 1 tablet by mouth once per week x 12 weeks.     fluticasone 50 MCG/ACT nasal spray  Commonly known as:  FLONASE  Place 1 spray into both nostrils daily.     furosemide 20 MG tablet  Commonly known as:  LASIX  TAKE 1 TABLET BY MOUTH TWICE  DAILY     hydrOXYzine 50 MG tablet  Commonly known as:  ATARAX/VISTARIL  1 qhs may repeat     lactulose 10 GM/15ML solution  Commonly known as:  CHRONULAC  Take 39 ml (20 grams) by mouth daily     LORazepam 0.5 MG tablet  Commonly known as:  ATIVAN  Take 1 tablet (0.5 mg total) by mouth every morning.     pantoprazole 40 MG tablet  Commonly known as:  PROTONIX  TAKE 1 TABLET (40 MG TOTAL) BY MOUTH DAILY.     propranolol 20 MG tablet  Commonly known as:  INDERAL  TAKE 1 TABLET (20 MG TOTAL) BY MOUTH DAILY.     Sodium Chloride-Sodium Bicarb 2300-700 MG Kit  Commonly known as:  CLASSIC NETI POT SINUS [place]  Please use as directed in the package.     SPIRIVA HANDIHALER 18 MCG inhalation capsule  Generic drug:  tiotropium  PLACE 1 CAPSULE (18 MCG TOTAL) INTO INHALER AND INHALE DAILY.     spironolactone 25 MG tablet  Commonly known as:  ALDACTONE  Take 1 tablet (25 mg total) by mouth daily.     traMADol 50 MG tablet  Commonly known as:  ULTRAM  Take 1  tablet by mouth every 6 hours as needed for pain        Disposition and follow-up:   Ms.Aneyah Racicot was discharged from [facility] in Stable condition.  At the hospital follow up visit please address:  1.  Hepatic Encephalopathy: assess mental status and compliance with rifaxamin and lactulose (may need further titration)  2. Cirrhosis: Patient has follow up appointment with Dr Patsy Baltimore on 5/19 we have held her Hep C treatment as this may have precipitated her liver decompensation.  Please consider increasing aldactone if needed.  3. Anxiety/ BZD use: we advised she d/c klonopin and take ativan 0.65m BID given her hepatic decompensation, please reassess.  2.  Labs / imaging needed at time of follow-up: CMP, Consider MRI of liver   3.  Pending labs/ test needing follow-up: BCx (collected 03/05/15, NGTD), Anti Mitochondrial Ab  Follow-up Appointments: Follow-up Information    Follow up with ZACKS,STEVEN L,  MD On 03/16/2015.   Specialty:  Gastroenterology   Why:  1:30 PM [facility].    Contact information:   [address] Medicine [id number]      Follow up with KJacques Earthly MD. Go on 03/22/2015.   Specialty:  Internal Medicine   Why:  10:15am hospital follow-up   Contact information:   [address] [place] [phone]      Discharge Instructions: Discharge Instructions    Diet - low sodium heart healthy    Complete by:  As directed      Discharge instructions    Complete by:  As directed   Please review you medication list carefully as we have made a number of changes.     Increase activity slowly    Complete by:  As directed            Consultations:    Procedures Performed:  UK[place]Abdomen Complete  03/05/2015   CLINICAL DATA:  Hyperbilirubinemia. History of hepatitis-C and cirrhosis.  EXAM: ULTRASOUND ABDOMEN COMPLETE  COMPARISON:  Ultrasound, 09/06/2014  FINDINGS: Gallbladder: Mildly distended. There is sludge in gallstones. Significant wall thickening measuring 1 cm. No tenderness to transducer pressure over the gallbladder.  Common bile duct: Diameter: 5.2 mm  Liver: Coarsened liver echotexture with surface nodularity. No discrete mass or lesion. No convincing portal vein flow.  IVC: No abnormality visualized.  Pancreas: Visualized portion unremarkable.  Spleen: Enlarged measuring 15 cm in greatest dimension with a volume of 500 mL. Evidence of a perisplenic varix.  Right Kidney: Length: 11.2 cm. Echogenicity within normal limits. No mass or hydronephrosis visualized.  Left Kidney: Length: 11.3 cm. Echogenicity within normal limits. No mass or hydronephrosis visualized.  Abdominal aorta: No aneurysm visualized.  Other findings: Ascites, small in overall amount, collecting adjacent to the liver and in the lower quadrants of the abdomen.  IMPRESSION: 1. Cirrhosis with  portal venous hypertension reflected by splenomegaly and ascites. 2. No document oval flow in the portal vein. It may either be thrombosed or have significantly reduced flow due to portal venous hypertension. 3. Gallstones in gallbladder sludge. Significant wall thickening. The wall thickening is nonspecific in the setting of ascites. There is no positive sonographic Murphy's sign to support acute cholecystitis. 4. No bile duct dilation. There is no evidence of biliary obstruction. Hyperbilirubinemia is likely on the basis of liver dysfunction.   Electronically Signed   By: DDedra SkeensD.  On: 03/05/2015 09:06   US Paracentesis  03/06/2015   INDICATION: Ascites, request for diagnostic and therapeutic paracentesis.  EXAM: ULTRASOUND-GUIDED PARACENTESIS  COMPARISON:  None.  MEDICATIONS: None.  COMPLICATIONS: None immediate  TECHNIQUE: Informed written consent was obtained from the patient's daughter after a discussion of the risks, benefits and alternatives to treatment. A timeout was performed prior to the initiation of the procedure.  Initial ultrasound scanning demonstrates a small amount of ascites within the right upper abdominal quadrant. The right upper abdomen was prepped and draped in the usual sterile fashion. 1% lidocaine was used for local anesthesia.  Under direct ultrasound guidance, a 19 gauge, 10-cm, Yueh catheter was introduced. An ultrasound image was saved for documentation purposed. The paracentesis was performed. The catheter was removed and a dressing was applied. The patient tolerated the procedure well without immediate post procedural complication.  FINDINGS: A total of approximately 1.2 liters of serous fluid was removed. Samples were sent to the laboratory as requested by the clinical team.  IMPRESSION: Successful ultrasound-guided paracentesis yielding 1.2 liters of peritoneal fluid.  Read By:  Tsosie Billing PA-C   Electronically Signed   By: Marybelle Killings M.D.   On: 03/06/2015 11:37     Dg Chest Port 1v Same Day  03/05/2015   CLINICAL DATA:  Encephalopathy.  EXAM: PORTABLE CHEST - 1 VIEW SAME DAY  COMPARISON:  Chest radiograph 01/23/2014.  FINDINGS: Cardiopericardial silhouette within normal limits. Mediastinal contours normal. Trachea midline. No airspace disease or effusion. Apical lordotic projection.  Monitoring leads project over the chest.  IMPRESSION: No active disease.   Electronically Signed   By: Dereck Ligas M.D.   On: 03/05/2015 15:26    Admission HPI:  54 yo female with COPD, Hep C cirrhosis, hepatic encephalopathy, hx of cervical cancer, here with AMS. This has been going on for last few days. Patient lives by herself and has been feeling poorly for last 2-3 days. Her daughter checked on her 1 day ago and she was feeling generally poorly. On day of admission, her daughter called and she told her "I am feeling like I am gonna die". Daughter called her later to see if she would like to come to the hospital but she did not respond. Daughter called the police and they broke in her house to get her. She was brought in here by EMS.   Patient has some complaint of suprapubic tenderness and dysuria, malaise, generalized weakness and low energy. No other complaints.   Hospital Course by problem list: Acute Hyperbilirubinemia: TBili presented with total bilirubin of 24 (direct 15.6 and indirect 8.4) compared to a TBili of 0.2 in 01/2015. TBili remained elevated throughout admission (range: 16.9 - 19.1). Differential diagnosis was broad and included: infection, meducations, end stage hepatic disease (althought AST/ALT were not significantly elevated), obstruction, autoimmune process, and paraneoplastic syndrome from malignancy causing cholestasis (patient has remote history of cervical cancer.) Abdominal ultrasound showed no flow in portal vein, suggesting possible portal vein thrombosis or significantly reduced flow due to portal venous hypertension. Given that it was unclear  whether the portal vein was thrombosed definitively and acutely and given patient's poor clinical status, decided against additional intervention. Urinalysis was consistent for UTI and patient was begun on ceftriaxone. Patient remained afebrile throughout admission. Blood, urine, ascitic cultures grew no organisism. Given persistent severe hyperbilirubinemia, GI felt her decompensation may reflect an adverse drug event from her Hepatitis C treatment and her Viekira/Ribivirin was discontinued and Dr. Patsy Baltimore her hepatologist was notified.  Please also consider additional outpatient imaging to r/o alternatives etiologies ([place], portal vein thrombosis) and trend TBili in outpatient setting. Please follow-up anti-mitochondrial antibody results.  Hepatic Encephalopathy: Patient presented with altered mental status secondary to hepatic encephalopathy and/or her psychiatric medications in the setting of liver failure leading to drug accumulation. Ammonia on admission was 129. Alcohol and acetaminophen levels were negative. UDS was positive for benzodiazapines and amphetamines. GI was consulted and patient was begun on oral lactulose. 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 AFP elevated to 54.6 in 2013. She was started on Viekira Pak/Ribavirin for 24 weeks on 11/08/2014 by UN[facility]Per hepatology note from 12/19/2014, she is not on the transplant list as her MELD score was 11 previously and she is a poor candidate for transplant because of her psychiatric co-morbidites. Followed by Dr. BrOlevia Perchesf LeVelora HecklerMELD score at presentation was 27 (19.6% 3 month mortality). GI was consulted and recommended continuing  Xifaxan, Inderal, Lasix and Aldactone. Ribavarin and ViLinzie Collinere held since they can cause hepatic decompensation in [place]. Patient was discharged on Xifaxan, Inderal, Lasix, and Aldactone. Please continue to hold Ribavarin and ViPaticia Stackntil hepatologist appointment on 5/19.   Consider increasing Aldactone to 5019mt hospital follow up.  Uncomplicated UTI: Patient presented with dysuria and suprapubic pain. Urinalysis was consistent with UTI. Patient completed four days of ceftriaxone.  AKI: Patient presented with SCr of 13 (baseline SCr ~0.5) presumed secondary to low PO intake. Held home Lasix for several days before resuming prior to discharge.  Chronic Pain: Patient is on tramadol 0.5mg55m h prn at home. Although patient did not demonstrate signs of benzodiazepine withdrawal, held home tramadol given its potential to lower the seizure threshold.  Anxiety/depression - Patient is on Remeron 45mg41m and Klonopin 1mg T4mat home. Both medications were held due to concern that they precipitated/ exacerbated her hepatic encephalopathy. According to [place] Controlled Substance Reporting System, patient also recently refilled an old prescription for Ativan (1mg in58m, 0.5mg qhs71mPrimary psychiatrist, Dr. Gerald PBirdena Crandalltified of inappropriate benzodiazapine usage. Patient was managed on scheduled Ativan during admission. Patient was discharged on Ativan 0.5mg BID 38m new prescription). Patient was strongly advised to avoid Klonopin and Remeron upon discharge due to their hepatic clearance. Please reevaluate patient's anxiety medications in context of hepatic injury and potential misuse.  COPD: Patient did not present in acute exacerbation. Held home albuterol, combivent, flonase, proair during admission. Patient was discharged on home medications.  Discharge Vitals:   BP 102/62 mmHg  Pulse 80  Temp(Src) 98 F (36.7 C) (Oral)  Resp 17  Ht '5\' 3"'  (1.6 m)  Wt 210 lb 1.6 oz (95.301 kg)  BMI  37.23 kg/m2  SpO2 94%  Discharge Labs:  No results found for this or any previous visit (from the past 24 hour(s)).  Signed: Cha Gomillion C HoLucious Groves/2016, 5:40 PM    Services Ordered on Discharge: none Equipment Ordered on Discharge: none

## 2015-03-09 NOTE — Progress Notes (Signed)
Subjective: Patient reports she is feeling much better and wants to go home.  She is having multiple loose stools a day currently. Objective: Vital signs in last 24 hours: Filed Vitals:   03/08/15 1657 03/08/15 2033 03/09/15 0414 03/09/15 0949  BP: 99/56 96/57 102/48 102/62  Pulse: 76 73 80 80  Temp: 97.8 F (36.6 C) 98.6 F (37 C) 98.5 F (36.9 C) 98 F (36.7 C)  TempSrc: Oral Oral Oral Oral  Resp: 17 16 18 17   Height:      Weight:  210 lb 1.6 oz (95.301 kg)    SpO2: 93% 99% 92% 94%   Weight change: 11 lb 14.5 oz (5.401 kg)  Intake/Output Summary (Last 24 hours) at 03/09/15 1029 Last data filed at 03/09/15 0900  Gross per 24 hour  Intake   1010 ml  Output      0 ml  Net   1010 ml   General: resting in bed, jaundiced HEENT: EOMI, scleral icterus Cardiac: RRR, no rubs, murmurs or gallops Pulm: clear to auscultation bilaterally Abd: soft, nontender, mild distension, BS present Ext: warm and well perfused, 1+ pedal edema Neuro: alert and oriented X4  Lab Results: Basic Metabolic Panel:  Recent Labs Lab 03/05/15 0700  03/08/15 0330 03/09/15 0601  NA 140  < > 138 134*  K 3.2*  < > 3.7 3.4*  CL 111  < > 108 108  CO2 20*  < > 20* 21*  GLUCOSE 135*  < > 92 141*  BUN 12  < > 13 14  CREATININE 1.05*  < > 0.91 1.14*  CALCIUM 7.4*  < > 7.8* 7.7*  MG 1.7  --  1.8  --   PHOS 3.1  --   --   --   < > = values in this interval not displayed. Liver Function Tests:  Recent Labs Lab 03/08/15 0330 03/09/15 0601  AST 68* 67*  ALT 32 34  ALKPHOS 115 144*  BILITOT 19.1* 19.1*  PROT 5.1* 4.9*  ALBUMIN 1.7* 1.7*    Recent Labs Lab 03/04/15 2222  AMMONIA 129*   CBC:  Recent Labs Lab 03/07/15 0257 03/08/15 0330  WBC 7.4 5.9  HGB 11.7* 10.9*  HCT 37.5 35.3*  MCV 107.8* 109.0*  PLT 121* 104*   CBG:  Recent Labs Lab 03/04/15 2124 03/08/15 1224  GLUCAP 122* 131*   Coagulation:  Recent Labs Lab 03/05/15 0053 03/06/15 0245 03/08/15 0330  03/09/15 0601  LABPROT 21.6* 22.0* 22.3* 21.7*  INR 1.86* 1.90* 1.94* 1.87*   Anemia Panel: No results for input(s): VITAMINB12, FOLATE, FERRITIN, TIBC, IRON, RETICCTPCT in the last 168 hours. Urine Drug Screen: Drugs of Abuse     Component Value Date/Time   LABOPIA NONE DETECTED 03/05/2015 1830   LABOPIA NEG 07/22/2012 1527   COCAINSCRNUR NONE DETECTED 03/05/2015 1830   COCAINSCRNUR NEG 07/22/2012 1527   LABBENZ POSITIVE* 03/05/2015 1830   LABBENZ PPS 07/22/2012 1527   LABBENZ NEG 05/14/2010 2116   AMPHETMU POSITIVE* 03/05/2015 1830   AMPHETMU NEG 07/22/2012 1527   AMPHETMU NEG 05/14/2010 2116   THCU NONE DETECTED 03/05/2015 1830   THCU NEG 07/22/2012 1527   LABBARB NONE DETECTED 03/05/2015 1830   LABBARB NEG 07/22/2012 1527    Alcohol Level:  Recent Labs Lab 03/05/15 0038  ETH <5   Urinalysis:  Recent Labs Lab 03/05/15 1830  COLORURINE RED*  LABSPEC 1.023  PHURINE 5.5  GLUCOSEU NEGATIVE  HGBUR NEGATIVE  BILIRUBINUR LARGE*  KETONESUR 15*  PROTEINUR NEGATIVE  UROBILINOGEN 1.0  NITRITE POSITIVE*  LEUKOCYTESUR SMALL*     Micro Results: Recent Results (from the past 240 hour(s))  Culture, blood (routine x 2)     Status: None (Preliminary result)   Collection Time: 03/05/15 12:30 AM  Result Value Ref Range Status   Specimen Description BLOOD LEFT ANTECUBITAL  Final   Special Requests BOTTLES DRAWN AEROBIC AND ANAEROBIC 10CC EA  Final   Culture   Final           BLOOD CULTURE RECEIVED NO GROWTH TO DATE CULTURE WILL BE HELD FOR 5 DAYS BEFORE ISSUING A FINAL NEGATIVE REPORT Performed at [facility]    Report Status PENDING  Incomplete  Culture, blood (routine x 2)     Status: None (Preliminary result)   Collection Time: 03/05/15 12:38 AM  Result Value Ref Range Status   Specimen Description BLOOD RIGHT HAND  Final   Special Requests BOTTLES DRAWN AEROBIC AND ANAEROBIC 5CC EA  Final   Culture   Final           BLOOD CULTURE RECEIVED NO GROWTH TO  DATE CULTURE WILL BE HELD FOR 5 DAYS BEFORE ISSUING A FINAL NEGATIVE REPORT Performed at [facility]    Report Status PENDING  Incomplete  MRSA PCR Screening     Status: None   Collection Time: 03/05/15  2:44 AM  Result Value Ref Range Status   MRSA by PCR NEGATIVE NEGATIVE Final    Comment:        The GeneXpert MRSA Assay (FDA approved for NASAL specimens only), is one component of a comprehensive MRSA colonization surveillance program. It is not intended to diagnose MRSA infection nor to guide or monitor treatment for MRSA infections.   Body fluid culture     Status: None (Preliminary result)   Collection Time: 03/06/15 10:58 AM  Result Value Ref Range Status   Specimen Description FLUID PERITONEAL  Final   Special Requests NONE  Final   Gram Stain   Final    RARE WBC PRESENT, PREDOMINANTLY MONONUCLEAR NO ORGANISMS SEEN Performed at [facility]    Culture   Final    NO GROWTH 1 DAY Performed at [facility]    Report Status PENDING  Incomplete   Studies/Results: No results found. Medications: I have reviewed the patient's current medications. Scheduled Meds: . furosemide  20 mg Oral Daily  . lactulose  20 g Oral Daily  . LORazepam  0.5 mg Oral q morning - 10a  . LORazepam  0.5 mg Oral QHS  . pneumococcal 23 valent vaccine  0.5 mL Intramuscular Tomorrow-1000  . propranolol  20 mg Oral Daily  . rifaximin  550 mg Oral BID  . spironolactone  25 mg Oral Daily   Continuous Infusions:  PRN Meds:.ondansetron **OR** ondansetron (ZOFRAN) IV Assessment/Plan:  Hyperbilirubinemia - T Bili up to 24 on admission has trended down to 16 but rose back and is steady around 19 - GI concerned that her HCV Tx may have caused her decompensation and we are holding Viekira/Ribaviran. - UTI has been treated - Patient to follow up with Dr Patsy Baltimore in 1 week - Discussed with GI and Heme that A/C for possible portal vein thrombis not indicated in this patient. -  Will arrange follow up with PCP   Hepatic encephalopathy - Continue lactulose once a day - Continue Xifaxan   Chronic hepatitis C with cirrhosis - Follows with Dr Patsy [facility]  5/19 - Low dose lasix and spironolactone added (will hold off on going up to 50mg , this may be done as outpatient) - Holding Viekira/Ribaviran as may have cause decompensation - Continue Inderal and PPI -Current plan is for outpatient MRI to [facility] for [facility]   Esophageal varices in cirrhosis - Stable   UTI (urinary tract infection) - Uncomplicated - Can D/C Ceftriaxone   Anxiety/ Chronic BZD use - Continue Ativan 0.5mg  BID  - Patient instructed to hold klonopin, trazodone, and remeron on discharge due to concerns about hepatic clearance.   Dispo: Discharge home today.  The patient does have a current PCP Milagros Loll, MD) and does need an [facility] hospital follow-up appointment after discharge.  The patient does not have transportation limitations that hinder transportation to clinic appointments.  .Services Needed at time of discharge: Y = Yes, Blank = No PT:   OT:   RN:   Equipment:   Other:     LOS: 4 days   Lucious Groves, DO 03/09/2015, 10:29 AM

## 2015-03-10 LAB — BODY FLUID CULTURE: Culture: NO GROWTH

## 2015-03-10 LAB — MITOCHONDRIAL ANTIBODIES: MITOCHONDRIAL M2 AB, IGG: 11.3 U (ref 0.0–20.0)

## 2015-03-11 LAB — CULTURE, BLOOD (ROUTINE X 2)
Culture: NO GROWTH
Culture: NO GROWTH

## 2015-03-14 ENCOUNTER — Telehealth: Payer: Self-pay | Admitting: *Deleted

## 2015-03-14 NOTE — Telephone Encounter (Addendum)
Prior Authorization request from pt's pharmacy for the [place].  Because  pt has a dx of hepathic encephalopathy, request approved.Despina Hidden Cassady5/17/20165:01 PM    Pharmacy aware  Medicare ID # [id number]

## 2015-03-21 ENCOUNTER — Telehealth: Payer: Self-pay | Admitting: *Deleted

## 2015-03-21 NOTE — Telephone Encounter (Signed)
Jinny Blossom with [facility] [phone] - planning to see pt today 03/21/15 - daughter Lenna Sciara Haithcock told [facility] to d/c skilled nursing visits.  Daughter states mother is always sleepy - [facility] questions if pt is taking generic Ativan two times per day. Directions Rx 03/09/15 states one tablet in AM. Nazly Digilio RN 03/21/15 2PM

## 2015-03-22 ENCOUNTER — Ambulatory Visit: Payer: Self-pay | Admitting: Internal Medicine

## 2015-03-24 ENCOUNTER — Other Ambulatory Visit: Payer: Self-pay | Admitting: Internal Medicine

## 2015-03-24 NOTE — Telephone Encounter (Signed)
Rx sent for polyethylene glyco/powder (GLYCOLAX/MIRALAX), #500 g with one refill to [facility].

## 2015-03-29 ENCOUNTER — Encounter: Payer: Self-pay | Admitting: Pulmonary Disease

## 2015-03-29 ENCOUNTER — Ambulatory Visit (INDEPENDENT_AMBULATORY_CARE_PROVIDER_SITE_OTHER): Payer: Medicaid Other | Admitting: Pulmonary Disease

## 2015-03-29 DIAGNOSIS — G8929 Other chronic pain: Secondary | ICD-10-CM | POA: Diagnosis not present

## 2015-03-29 DIAGNOSIS — M25571 Pain in right ankle and joints of right foot: Secondary | ICD-10-CM

## 2015-03-29 DIAGNOSIS — E876 Hypokalemia: Secondary | ICD-10-CM | POA: Diagnosis not present

## 2015-03-29 DIAGNOSIS — Z0000 Encounter for general adult medical examination without abnormal findings: Secondary | ICD-10-CM

## 2015-03-29 DIAGNOSIS — K7682 Hepatic encephalopathy: Secondary | ICD-10-CM

## 2015-03-29 DIAGNOSIS — I10 Essential (primary) hypertension: Secondary | ICD-10-CM

## 2015-03-29 DIAGNOSIS — K7460 Unspecified cirrhosis of liver: Secondary | ICD-10-CM

## 2015-03-29 DIAGNOSIS — K7469 Other cirrhosis of liver: Secondary | ICD-10-CM

## 2015-03-29 DIAGNOSIS — K7290 Hepatic failure, unspecified without coma: Secondary | ICD-10-CM

## 2015-03-29 LAB — COMPLETE METABOLIC PANEL WITH GFR
ALT: 19 U/L (ref 0–35)
AST: 46 U/L — ABNORMAL HIGH (ref 0–37)
Albumin: 2.8 g/dL — ABNORMAL LOW (ref 3.5–5.2)
Alkaline Phosphatase: 141 U/L — ABNORMAL HIGH (ref 39–117)
BUN: 6 mg/dL (ref 6–23)
CALCIUM: 8 mg/dL — AB (ref 8.4–10.5)
CO2: 29 mEq/L (ref 19–32)
CREATININE: 0.78 mg/dL (ref 0.50–1.10)
Chloride: 102 mEq/L (ref 96–112)
GFR, EST NON AFRICAN AMERICAN: 85 mL/min
GFR, Est African American: >89 mL/min
Glucose, Bld: 94 mg/dL (ref 70–99)
Potassium: 3.1 mEq/L — ABNORMAL LOW (ref 3.5–5.3)
Sodium: 140 mEq/L (ref 135–145)
Total Bilirubin: 7.5 mg/dL — ABNORMAL HIGH (ref 0.2–1.2)
Total Protein: 6 g/dL (ref 6.0–8.3)

## 2015-03-29 MED ORDER — SPIRONOLACTONE 25 MG PO TABS: 12.5000 mg | ORAL_TABLET | Freq: Every day | ORAL | Status: DC

## 2015-03-29 NOTE — Patient Instructions (Signed)
If you have less than 3 bowel movements in 24 hours. Take lactulose. May take up to 4 doses a day. If this does not help, please call the clinic.

## 2015-03-29 NOTE — Progress Notes (Signed)
Subjective:   [name], female    DOB: [birth year], 54 y.o.   MRN: [id number]  HPI [name] is a 54 year old woman with history of hypertension, hepatitis C, alcoholic cirrhosis, COPD, GERD, depression/anxiety presenting for follow-up.   She was recently hospitalized 03/04/2015-03/10/2015 for hepatic encephalopathy. She had persistent severe hyperbilirubinemia that GI felt was related to her hepatitis C treatment. Her medications were held. The mitochondrial antibodies were negative.  Hepatic encephalopathy: Mental status remains at baseline. Takes rifaximin 550 mg twice a day, and she stopped lactulose 20 g daily 3 or 4 days ago. Has been having about 3 minute bowel movements a day. Jaundice is stable/improved. She has not followed up with Dr. Patsy [name], but has appointment next week or so.  Discontinued Klonopin and advised Ativan 0.5 mg twice a day. She has been taking Ativan 1 mg daily at bedtime.  UTI: denies dysuria  Review of Systems Constitutional: no fevers, some chills Eyes: no vision changes Ears, nose, mouth, throat, and face: no cough Respiratory: chronic shortness of breath Cardiovascular: no chest pain Gastrointestinal: no nausea/vomiting, +chronic abdominal pain, no constipation, no diarrhea Genitourinary: no dysuria, no hematuria Integument: no rash Hematologic/lymphatic: no bleeding/bruising, no edema Musculoskeletal: + Chronic back pain Neurological: no paresthesias, no unilateral weakness  Past Medical History  Diagnosis Date  . Alcoholic cirrhosis   . COPD (chronic obstructive pulmonary disease)   . Heart palpitations   . Anxiety   . Portal hypertension   . Esophageal varices   . Cholelithiasis   . Splenomegaly   . Fatty liver   . Esophagitis 2010  . Tubulovillous adenoma of colon   . Visual hallucination     since 09/2010/notes 10/30/2012  . Hepatitis C     chronic hepatitis C and Steatohepatitis (hep grade 2, stage 2-3) per 05/13/08  liver biopsy  . Obesity   . cervical Cancer 1999  . Depression   . Shortness of breath   . Allergy   . Arthritis   . GERD (gastroesophageal reflux disease)   . Hypertension   . Gastritis   . Decompensated hepatic cirrhosis 03/07/2015    Side effect of Vikera  . Adverse drug effect - [facility] 03/07/2015    Current Outpatient Prescriptions on File Prior to Visit  Medication Sig Dispense Refill  . albuterol (PROVENTIL) (2.5 MG/3ML) 0.083% nebulizer solution Take 2.5 mg by nebulization 2 (two) times daily.    . COMBIVENT RESPIMAT 20-100 MCG/ACT AERS respimat INHALE 1 PUFF INTO THE LUNGS EVERY 6 (SIX) HOURS AS NEEDED FOR WHEEZING. 4 g 1  . fluconazole (DIFLUCAN) 100 MG tablet Take 1 tablet by mouth once per week x 12 weeks. 12 tablet 0  . fluticasone (FLONASE) 50 MCG/ACT nasal spray Place 1 spray into both nostrils daily. 9.9 g 2  . furosemide (LASIX) 20 MG tablet TAKE 1 TABLET BY MOUTH TWICE DAILY (Patient not taking: Reported on 01/05/2015) 60 tablet 1  . hydrOXYzine (ATARAX/VISTARIL) 50 MG tablet 1 qhs may repeat 60 tablet 3  . lactulose (CHRONULAC) 10 GM/15ML solution Take 39 ml (20 grams) by mouth daily 1350 mL 1  . LORazepam (ATIVAN) 0.5 MG tablet Take 1 tablet (0.5 mg total) by mouth every morning. 30 tablet 0  . pantoprazole (PROTONIX) 40 MG tablet TAKE 1 TABLET (40 MG TOTAL) BY MOUTH DAILY. (Patient not taking: Reported on 03/04/2015) 90 tablet 1  . polyethylene glycol powder (GLYCOLAX/MIRALAX) powder TAKE 1/2-1 CAPFUL (9-17 G) BY MOUTH ONCE DAILY 500 g  1  . PROAIR HFA 108 (90 BASE) MCG/ACT inhaler INHALE TWO   PUFFS INTO THE LUNGS EVERY 6 HOURS AS NEEDED FOR WHEEZING. 8.5 g 6  . propranolol (INDERAL) 20 MG tablet TAKE 1 TABLET (20 MG TOTAL) BY MOUTH DAILY. 90 tablet 3  . rifaximin (XIFAXAN) 550 MG TABS tablet Take 1 tablet (550 mg total) by mouth 2 (two) times daily. 60 tablet 0  . Sodium Chloride-Sodium Bicarb (CLASSIC NETI POT SINUS WASH) 2300-700 MG KIT Please use as directed in  the package. (Patient not taking: Reported on 01/05/2015) 1 each 0  . SPIRIVA HANDIHALER 18 MCG inhalation capsule PLACE 1 CAPSULE (18 MCG TOTAL) INTO INHALER AND INHALE DAILY. 30 capsule 6  . spironolactone (ALDACTONE) 25 MG tablet Take 1 tablet (25 mg total) by mouth daily. 30 tablet 0  . traMADol (ULTRAM) 50 MG tablet Take 1 tablet by mouth every 6 hours as needed for pain 120 tablet 5   No current facility-administered medications on file prior to visit.    Today's Vitals   03/29/15 1519  BP: 95/61  Pulse: 65  SpO2: 93%  PainSc: 8    Objective:  Physical Exam  Constitutional: She is oriented to person, place, and time. No distress.  HENT:  Head: Normocephalic and atraumatic.  Eyes: Scleral icterus is present.  Neck: Neck supple.  Cardiovascular: Normal rate and regular rhythm.   Pulmonary/Chest: Breath sounds normal.  Abdominal: Soft. She exhibits no distension. There is no tenderness.  Musculoskeletal: Normal range of motion.  Neurological: She is alert and oriented to person, place, and time.  No asterixis  Skin: Skin is warm and dry.   Assessment & Plan:  Please refer to problem based charting.

## 2015-03-30 DIAGNOSIS — E876 Hypokalemia: Secondary | ICD-10-CM | POA: Insufficient documentation

## 2015-03-30 MED ORDER — POTASSIUM CHLORIDE ER 10 MEQ PO TBCR: 10.0000 meq | EXTENDED_RELEASE_TABLET | Freq: Every day | ORAL | Status: DC

## 2015-03-30 NOTE — Assessment & Plan Note (Signed)
Having 3 bowel movements per 24 hour period. Mental status stable.  Plan: -Continue rifaximin 550 mg twice a day. -Patient instructed to restart lactulose if she has less than 3 bowel movements per 24 hour period. Also instructed that she can titrate up the lactulose dosing to 4 times a day for goal of 3 bowel movements.

## 2015-03-30 NOTE — Assessment & Plan Note (Signed)
BP Readings from Last 3 Encounters:  03/29/15 95/61  02/08/15 97/65  01/05/15 96/59    Lab Results  Component Value Date   NA 140 03/29/2015   K 3.1* 03/29/2015   CREATININE 0.78 03/29/2015    Assessment: Blood pressure control: controlled Progress toward BP goal:  at goal Comments: She reports some dizziness. Negative orthostatics.  Plan: Medications:  continue current medications including Lasix 20 mg twice a day, propanolol 20 mg daily. Decrease spironolactone to 12.5 mg daily Other plans:  -Follow-up in 3 months

## 2015-03-30 NOTE — Assessment & Plan Note (Signed)
Mammogram ordered

## 2015-03-30 NOTE — Assessment & Plan Note (Addendum)
Potassium 3.1 on CMP. Decreased spironolactone to 12.5 daily. She is still on her Lasix. Creatinine within normal limits.  Plan: -Start potassium chloride 10 mEq daily -Discussed with patient's daughter, Lenna Sciara, to bring patient in for labs to be drawn in 2 weeks.

## 2015-03-30 NOTE — Assessment & Plan Note (Signed)
She was recently hospitalized 03/04/2015-03/10/2015 for hepatic encephalopathy. She had persistent severe hyperbilirubinemia that GI felt was related to her hepatitis C treatment. Her medications were held. The mitochondrial antibodies were negative. Repeat CMP with alkaline phosphatase 141 down from 144, albumin 2.8 up from 1.7, AST 46 down from 67, total bilirubin 7.5 down from 19.1. MELD 21.  Plan:  -Encouraged patient to follow up with her hepatologist Dr. Patsy Baltimore

## 2015-03-30 NOTE — Assessment & Plan Note (Signed)
MR ankle 10/12/2014 demonstrates thinning of the tibiotalar articular cartilage, suspected tear of the tibiotalar portion of the deltoid ligament, thinned and potentially sprained tibiospring in tibionavicular components of the deltoid ligament, distal tibialis posterior tendinopathy, degenerative chondral thinning in the middle subtalar facet, with small effusions of the posterior subtalar joint partially extending into the sinus pars see, thickened and potentially sprained calcaneofibular ligament. She continues to have pain limiting her ability to move.  Plan: -Referral to sports medicine.

## 2015-03-31 ENCOUNTER — Ambulatory Visit (HOSPITAL_COMMUNITY): Payer: Self-pay | Admitting: Psychiatry

## 2015-03-31 NOTE — Telephone Encounter (Signed)
Pt was seen in clinic 03/29/15 by Dr Randell Patient.

## 2015-03-31 NOTE — Progress Notes (Signed)
Internal Medicine Clinic Attending  Case discussed with Dr. Krall at the time of the visit.  We reviewed the resident's history and exam and pertinent patient test results.  I agree with the assessment, diagnosis, and plan of care documented in the resident's note.  

## 2015-04-03 ENCOUNTER — Encounter: Payer: Self-pay | Admitting: *Deleted

## 2015-04-07 ENCOUNTER — Other Ambulatory Visit: Payer: Self-pay | Admitting: Internal Medicine

## 2015-04-12 ENCOUNTER — Encounter: Payer: Self-pay | Admitting: Pulmonary Disease

## 2015-04-12 ENCOUNTER — Ambulatory Visit (INDEPENDENT_AMBULATORY_CARE_PROVIDER_SITE_OTHER): Payer: Medicaid Other | Admitting: Pulmonary Disease

## 2015-04-12 VITALS — BP 96/71 | HR 65 | Temp 98.6°F | Wt 171.7 lb

## 2015-04-12 DIAGNOSIS — E876 Hypokalemia: Secondary | ICD-10-CM | POA: Diagnosis not present

## 2015-04-12 DIAGNOSIS — G8929 Other chronic pain: Secondary | ICD-10-CM | POA: Diagnosis not present

## 2015-04-12 DIAGNOSIS — K7290 Hepatic failure, unspecified without coma: Secondary | ICD-10-CM

## 2015-04-12 DIAGNOSIS — K7682 Hepatic encephalopathy: Secondary | ICD-10-CM

## 2015-04-12 LAB — BASIC METABOLIC PANEL WITH GFR
BUN: 7 mg/dL (ref 6–23)
CALCIUM: 8.3 mg/dL — AB (ref 8.4–10.5)
CO2: 27 mEq/L (ref 19–32)
Chloride: 99 mEq/L (ref 96–112)
Creat: 0.85 mg/dL (ref 0.50–1.10)
GFR, EST AFRICAN AMERICAN: 89 mL/min
GFR, EST NON AFRICAN AMERICAN: 77 mL/min
Glucose, Bld: 193 mg/dL — ABNORMAL HIGH (ref 70–99)
Potassium: 3.1 mEq/L — ABNORMAL LOW (ref 3.5–5.3)
SODIUM: 136 meq/L (ref 135–145)

## 2015-04-12 MED ORDER — TRAMADOL HCL 50 MG PO TABS: ORAL_TABLET | ORAL | Status: DC

## 2015-04-12 NOTE — Patient Instructions (Signed)
We will have physical therapy and/or occupational therapy visit you at your house to see if there are any needs (like an aide).

## 2015-04-12 NOTE — Progress Notes (Signed)
Subjective:   [name], female    DOB: [birth year], 54 y.o.   MRN: [id number]  HPI [name] is a 54 year old woman with history of hypertension, hepatitis C, alcohol cirrhosis, COPD, GERD, depression/anxiety presenting for follow-up.  Spoke with her mother who requested to speak to me in private. She notes that [name] has been very forgetful and has trouble with her ADLs. At one point, she left the stove on in her apartment. [name] has been unable to help her for the past several days as she has undergone surgery for hernia repair.  Patient has no complaints today. She does acknowledge that she is forgetful. She thinks that her daughter is not living with her anymore because [name] was angry at her for stealing her ATM card. She also thinks that her family still her jewelry while she was hospitalized. She would like a refill of her tramadol. She reports she takes 1 tablet 4 times a day. She reports one bowel movement a day. She stopped taking her lactulose because it was causing her diarrhea. She reports her abdomen is stable.  Review of Systems Constitutional: no fevers/chills Eyes: no vision changes Ears, nose, mouth, throat, and face: no cough Respiratory: no shortness of breath Cardiovascular: no chest pain Gastrointestinal: no nausea/vomiting, no abdominal pain, no constipation, no diarrhea Genitourinary: no dysuria, no hematuria Integument: no rash Hematologic/lymphatic: no bleeding/bruising, + edema Musculoskeletal: + Chronic arthralgias, no myalgias Neurological: no paresthesias, + generalized weakness  Past Medical History  Diagnosis Date  . Alcoholic cirrhosis   . COPD (chronic obstructive pulmonary disease)   . Heart palpitations   . Anxiety   . Portal hypertension   . Esophageal varices   . Cholelithiasis   . Splenomegaly   . Fatty liver   . Esophagitis 2010  . Tubulovillous adenoma of colon   . Visual hallucination      since 09/2010/notes 10/30/2012  . Hepatitis C     chronic hepatitis C and Steatohepatitis (hep grade 2, stage 2-3) per 05/13/08 liver biopsy  . Obesity   . cervical Cancer 1999  . Depression   . Shortness of breath   . Allergy   . Arthritis   . GERD (gastroesophageal reflux disease)   . Hypertension   . Gastritis   . Decompensated hepatic cirrhosis 03/07/2015    Side effect of Vikera  . Adverse drug effect - [facility] 03/07/2015    Current Outpatient Prescriptions on File Prior to Visit  Medication Sig Dispense Refill  . albuterol (PROVENTIL) (2.5 MG/3ML) 0.083% nebulizer solution Take 2.5 mg by nebulization 2 (two) times daily.    . COMBIVENT RESPIMAT 20-100 MCG/ACT AERS respimat INHALE 1 PUFF INTO THE LUNGS EVERY 6 (SIX) HOURS AS NEEDED FOR WHEEZING. 4 g 1  . fluconazole (DIFLUCAN) 100 MG tablet Take 1 tablet by mouth once per week x 12 weeks. 12 tablet 0  . fluticasone (FLONASE) 50 MCG/ACT nasal spray PLACE 1 SPRAY INTO BOTH NOSTRILS DAILY. 16 g 2  . furosemide (LASIX) 20 MG tablet TAKE 1 TABLET BY MOUTH TWICE DAILY (Patient not taking: Reported on 01/05/2015) 60 tablet 1  . hydrOXYzine (ATARAX/VISTARIL) 50 MG tablet 1 qhs may repeat 60 tablet 3  . lactulose (CHRONULAC) 10 GM/15ML solution Take 39 ml (20 grams) by mouth daily 1350 mL 1  . LORazepam (ATIVAN) 0.5 MG tablet Take 1 tablet (0.5 mg total) by mouth every morning. 30 tablet 0  . pantoprazole (PROTONIX) 40 MG  tablet TAKE 1 TABLET (40 MG TOTAL) BY MOUTH DAILY. (Patient not taking: Reported on 03/04/2015) 90 tablet 1  . polyethylene glycol powder (GLYCOLAX/MIRALAX) powder TAKE 1/2-1 CAPFUL (9-17 G) BY MOUTH ONCE DAILY 500 g 1  . potassium chloride (K-DUR) 10 MEQ tablet Take 1 tablet (10 mEq total) by mouth daily. 30 tablet 0  . PROAIR HFA 108 (90 BASE) MCG/ACT inhaler INHALE TWO   PUFFS INTO THE LUNGS EVERY 6 HOURS AS NEEDED FOR WHEEZING. 8.5 g 6  . propranolol (INDERAL) 20 MG tablet TAKE 1 TABLET (20 MG TOTAL) BY MOUTH DAILY. 90  tablet 3  . rifaximin (XIFAXAN) 550 MG TABS tablet Take 1 tablet (550 mg total) by mouth 2 (two) times daily. 60 tablet 0  . Sodium Chloride-Sodium Bicarb (CLASSIC NETI POT SINUS WASH) 2300-700 MG KIT Please use as directed in the package. (Patient not taking: Reported on 01/05/2015) 1 each 0  . SPIRIVA HANDIHALER 18 MCG inhalation capsule PLACE 1 CAPSULE (18 MCG TOTAL) INTO INHALER AND INHALE DAILY. 30 capsule 6  . spironolactone (ALDACTONE) 25 MG tablet Take 0.5 tablets (12.5 mg total) by mouth daily. 30 tablet 0  . traMADol (ULTRAM) 50 MG tablet Take 1 tablet by mouth every 6 hours as needed for pain 120 tablet 5   No current facility-administered medications on file prior to visit.    Today's Vitals   04/12/15 1348  BP: 96/71  Pulse: 65  Temp: 98.6 F (37 C)  TempSrc: Oral  Weight: 171 lb 11.2 oz (77.883 kg)  SpO2: 94%  PainSc: 8   PainLoc: Back    Objective:  Physical Exam  Constitutional: She is oriented to person, place, and time. No distress.  HENT:  Head: Normocephalic and atraumatic.  Mouth/Throat: Oropharynx is clear and moist.  Eyes: Conjunctivae are normal.  Neck: Neck supple.  Cardiovascular: Normal rate and regular rhythm.   Pulmonary/Chest: Effort normal and breath sounds normal.  Abdominal: Soft. She exhibits no distension.  Neurological: She is alert and oriented to person, place, and time.  Skin: Skin is warm and dry.  Psychiatric:  Recall largely intact as she was able to name 3 items that I asked her to remember on second attempt.    Assessment & Plan:  Please refer to problem based charting.

## 2015-04-13 ENCOUNTER — Telehealth: Payer: Self-pay | Admitting: Licensed Clinical Social Worker

## 2015-04-13 MED ORDER — RIFAXIMIN 550 MG PO TABS: 550.0000 mg | ORAL_TABLET | Freq: Two times a day (BID) | ORAL | Status: DC

## 2015-04-13 NOTE — Assessment & Plan Note (Signed)
Refill tramadol 50 mg every 6 hours as needed #120 no refills

## 2015-04-13 NOTE — Telephone Encounter (Signed)
[name] was referred to CSW for home health services.  At this time pt's insurance will not cover [facility] PT/OT for home safety eval, but will cover [facility] RN if pt is home bound.  PCP notified.  CSW placed call to [name] to determine needed/requested community services.  Pt states she needs assistance with passive ADL's (washing clothes, mopping floors, hanging clothes on line).  [name] states she has difficulty getting dressed in the morning "my legs are hard to raise and getting them in the pant hole takes a long time."  Pt also states it take her a long time to fill her pill box.  Family assists as much as they can by fixing meals at times and hanging clothes on clothes line to dry. CSW inquired if pt currently has services in the home or had home health in the past, pt denied.  However, [name] was recently d/c from [facility] last month.  Pt requesting PCS assessment, will forward to PCP and will discuss referral to [facility] for community care management with [name].

## 2015-04-13 NOTE — Assessment & Plan Note (Signed)
Potassium unchanged. She is likely noncompliant as she has trouble with her medication management.  Plan: -Skilled RN for medication management -Recheck potassium at follow-up

## 2015-04-13 NOTE — Assessment & Plan Note (Addendum)
Patient reports one bowel movement per day. She is alert and oriented 3. No asterixis.  Plan: -Discussed with patient importance of taking lactulose -Refilled rifaximin 550 mg twice a day -Discussed with social work Therapist, art, aide for help with medication management and ADLs -Follow up in 1 month

## 2015-04-18 NOTE — Progress Notes (Signed)
Internal Medicine Clinic Attending  Case discussed with Dr. Krall at the time of the visit.  We reviewed the resident's history and exam and pertinent patient test results.  I agree with the assessment, diagnosis, and plan of care documented in the resident's note.  

## 2015-04-21 ENCOUNTER — Other Ambulatory Visit: Payer: Self-pay | Admitting: Pulmonary Disease

## 2015-04-21 ENCOUNTER — Other Ambulatory Visit: Payer: Self-pay | Admitting: Internal Medicine

## 2015-05-02 NOTE — Addendum Note (Signed)
Addended by: Hulan Fray on: 05/02/2015 06:54 PM   Modules accepted: Orders

## 2015-05-08 ENCOUNTER — Telehealth: Payer: Self-pay | Admitting: Pulmonary Disease

## 2015-05-08 ENCOUNTER — Other Ambulatory Visit: Payer: Self-pay | Admitting: *Deleted

## 2015-05-08 DIAGNOSIS — G8929 Other chronic pain: Secondary | ICD-10-CM

## 2015-05-08 NOTE — Telephone Encounter (Signed)
Per pharmacy pt picked up last refill 04/27/2015 Last uds 02/2015 No appt scheduled, last visit 03/29/2015

## 2015-05-08 NOTE — Telephone Encounter (Signed)
Pharmacy [facility]  Pain medication

## 2015-05-09 NOTE — Telephone Encounter (Signed)
Request sent to md

## 2015-05-10 ENCOUNTER — Ambulatory Visit (INDEPENDENT_AMBULATORY_CARE_PROVIDER_SITE_OTHER): Payer: Medicaid Other | Admitting: Psychiatry

## 2015-05-10 VITALS — BP 132/72 | HR 55 | Ht 63.0 in | Wt 178.6 lb

## 2015-05-10 DIAGNOSIS — F331 Major depressive disorder, recurrent, moderate: Secondary | ICD-10-CM

## 2015-05-10 MED ORDER — BUSPIRONE HCL 5 MG PO TABS: ORAL_TABLET | ORAL | Status: DC

## 2015-05-10 MED ORDER — LORAZEPAM 0.5 MG PO TABS: 0.5000 mg | ORAL_TABLET | Freq: Every morning | ORAL | Status: DC
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
## 2015-05-11 ENCOUNTER — Encounter: Payer: Self-pay | Admitting: Internal Medicine

## 2015-05-11 ENCOUNTER — Ambulatory Visit (INDEPENDENT_AMBULATORY_CARE_PROVIDER_SITE_OTHER): Payer: Medicaid Other | Admitting: Internal Medicine

## 2015-05-11 VITALS — BP 157/80 | HR 49 | Temp 97.7°F | Ht 63.0 in | Wt 180.4 lb

## 2015-05-11 DIAGNOSIS — D696 Thrombocytopenia, unspecified: Secondary | ICD-10-CM | POA: Insufficient documentation

## 2015-05-11 DIAGNOSIS — E876 Hypokalemia: Secondary | ICD-10-CM

## 2015-05-11 DIAGNOSIS — K7290 Hepatic failure, unspecified without coma: Secondary | ICD-10-CM

## 2015-05-11 DIAGNOSIS — E559 Vitamin D deficiency, unspecified: Secondary | ICD-10-CM

## 2015-05-11 DIAGNOSIS — K7460 Unspecified cirrhosis of liver: Secondary | ICD-10-CM | POA: Diagnosis present

## 2015-05-11 DIAGNOSIS — Z0000 Encounter for general adult medical examination without abnormal findings: Secondary | ICD-10-CM

## 2015-05-11 DIAGNOSIS — D539 Nutritional anemia, unspecified: Secondary | ICD-10-CM | POA: Diagnosis not present

## 2015-05-11 DIAGNOSIS — B182 Chronic viral hepatitis C: Secondary | ICD-10-CM

## 2015-05-11 DIAGNOSIS — I10 Essential (primary) hypertension: Secondary | ICD-10-CM

## 2015-05-11 DIAGNOSIS — Z87891 Personal history of nicotine dependence: Secondary | ICD-10-CM | POA: Diagnosis not present

## 2015-05-11 DIAGNOSIS — K7682 Hepatic encephalopathy: Secondary | ICD-10-CM

## 2015-05-11 LAB — COMPLETE METABOLIC PANEL WITH GFR
ALBUMIN: 2.7 g/dL — AB (ref 3.5–5.2)
ALT: 19 U/L (ref 0–35)
AST: 43 U/L — ABNORMAL HIGH (ref 0–37)
Alkaline Phosphatase: 123 U/L — ABNORMAL HIGH (ref 39–117)
BILIRUBIN TOTAL: 4.1 mg/dL — AB (ref 0.2–1.2)
BUN: 6 mg/dL (ref 6–23)
CO2: 22 mEq/L (ref 19–32)
CREATININE: 0.59 mg/dL (ref 0.50–1.10)
Calcium: 7.7 mg/dL — ABNORMAL LOW (ref 8.4–10.5)
Chloride: 110 mEq/L (ref 96–112)
GFR, Est African American: >89 mL/min
Glucose, Bld: 77 mg/dL (ref 70–99)
Potassium: 3.6 mEq/L (ref 3.5–5.3)
Sodium: 142 mEq/L (ref 135–145)
Total Protein: 5.7 g/dL — ABNORMAL LOW (ref 6.0–8.3)

## 2015-05-11 LAB — TSH: TSH: 2.482 u[IU]/mL (ref 0.350–4.500)

## 2015-05-11 MED ORDER — LACTULOSE 10 GM/15ML PO SOLN: ORAL | Status: DC

## 2015-05-11 MED ORDER — FUROSEMIDE 20 MG PO TABS: 20.0000 mg | ORAL_TABLET | Freq: Two times a day (BID) | ORAL | Status: DC

## 2015-05-11 MED ORDER — SPIRONOLACTONE 25 MG PO TABS: 25.0000 mg | ORAL_TABLET | Freq: Every day | ORAL | Status: DC

## 2015-05-11 NOTE — Patient Instructions (Addendum)
-  Start taking lactulose 39 mL every day in addition to rifaximin 550 twice a day, you need to have  2-3 bowel movements so that you don't become confused again -Increase your spironolactone from 12.5 mg daily to 25 mg daily and keep taking lasix 20 mg twice a day to help with the fluid in your belly and blood pressure -Will check your bloodwork today and call you with the results to see if you still need to take potassium supplements -Very nice seeing you again!   General Instructions:   Please bring your medicines with you each time you come to clinic.  Medicines may include prescription medications, over-the-counter medications, herbal remedies, eye drops, vitamins, or other pills.   Progress Toward Treatment Goals:  Treatment Goal 03/29/2015  Blood pressure at goal    Self Care Goals & Plans:  Self Care Goal 05/11/2015  Manage my medications take my medicines as prescribed; bring my medications to every visit; refill my medications on time; follow the sick day instructions if I am sick  Monitor my health keep track of my blood pressure  Eat healthy foods eat more vegetables; eat fruit for snacks and desserts; eat foods that are low in salt; eat baked foods instead of fried foods; eat smaller portions  Be physically active find an activity I enjoy    No flowsheet data found.   Care Management & Community Referrals:  Referral 01/13/2013  Referrals made for care management support none needed

## 2015-05-11 NOTE — Progress Notes (Signed)
[name], female   DOB: 1961/01/17, 54 y.o.   MRN: [id number]    Subjective:   [name] female   DOB: 12/08/1960 54 y.o.   MRN: [id number]  HPI: [name] is a 54 y.o. pleasant woman with past medical history of COPD, chronic Hepatitis C genotype 1, alcoholic cirrhosis with esophageal varies and chronic thrombocytopenia, cervical cancer s/p TAH 2000, chronic right foot pain, anxiety, and depression who presents for follow-up visit.   She was last seen on 6/15 for follow-up of hepatic encephalopathy. She was noncompliant with taking lactulose but compliant with taking rifaximin 550 BID with only 1 BM daily. She continues to be noncompliant with taking lactulose and has 1 BM daily. She reports some confusion recently without tremor.   She has history of alcoholic liver cirrhosis and chronic hepatitis C infection. She recently had decompensated liver cirrhosis thought to be due to recently started antiviral treatment (viekra and ribavirin) for hepatitis C in January of 2016 and these medications were consequently discontinued during recent hospitalization in May. Viral load on 03/05/15 was undetectable. Last abdominal US on 03/05/15 revealed liver cirrhosis with portal venous hypertension in setting of ascites and splenomegaly. She also had gallstone and sludge. She is followed by Dr. Patsy Baltimore and reports being on a liver transplant list.   She is on lasix 20 mg BID which she has been taking only once a day. It is unclear if she is taking spironolactone 12.5 mg daily. She reports some distension in her periumbilical region and has chronic b/l LE edema. She denies dyspnea . She has gained 9 lb since last visit 1 month ago.   She is not taking kdur 10 mEq for hypokalemia which she was also noncompliant at last visit. She denies muscle cramping.   She has macrocytic anemia for the past 2 months. She denies hematuria, hematochezia, melena, hematemesis, or vaginal bleeding.  She had EGD on 10/28/11 that revealed small esophageal varices and is compliant with taking propanolol 20 mg daily. She had colonoscopy on 04/26/09 with evidence of pedunculated polyp in the sigmoid colon and was due for repeat in 2015.     Past Medical History  Diagnosis Date  . Alcoholic cirrhosis   . COPD (chronic obstructive pulmonary disease)   . Heart palpitations   . Anxiety   . Portal hypertension   . Esophageal varices   . Cholelithiasis   . Splenomegaly   . Fatty liver   . Esophagitis 2010  . Tubulovillous adenoma of colon   . Visual hallucination     since 09/2010/notes 10/30/2012  . Hepatitis C     chronic hepatitis C and Steatohepatitis (hep grade 2, stage 2-3) per 05/13/08 liver biopsy  . Obesity   . cervical Cancer 1999  . Depression   . Shortness of breath   . Allergy   . Arthritis   . GERD (gastroesophageal reflux disease)   . Hypertension   . Gastritis   . Decompensated hepatic cirrhosis 03/07/2015    Side effect of Vikera  . Adverse drug effect - [facility] Drone 03/07/2015   Current Outpatient Prescriptions  Medication Sig Dispense Refill  . albuterol (PROVENTIL) (2.5 MG/3ML) 0.083% nebulizer solution Take 2.5 mg by nebulization 2 (two) times daily.    . busPIRone (BUSPAR) 5 MG tablet 1 bid  With food 60 tablet 5  . COMBIVENT RESPIMAT 20-100 MCG/ACT AERS respimat INHALE 1 PUFF INTO THE LUNGS EVERY 6 (SIX) HOURS AS NEEDED FOR WHEEZING. 4  g 3  . fluconazole (DIFLUCAN) 100 MG tablet Take 1 tablet by mouth once per week x 12 weeks. 12 tablet 0  . fluticasone (FLONASE) 50 MCG/ACT nasal spray PLACE 1 SPRAY INTO BOTH NOSTRILS DAILY. 16 g 2  . furosemide (LASIX) 20 MG tablet TAKE 1 TABLET BY MOUTH TWICE DAILY (Patient not taking: Reported on 01/05/2015) 60 tablet 1  . hydrOXYzine (ATARAX/VISTARIL) 50 MG tablet 1 qhs may repeat 60 tablet 3  . lactulose (CHRONULAC) 10 GM/15ML solution Take 39 ml (20 grams) by mouth daily 1350 mL 1  . LORazepam (ATIVAN) 0.5 MG tablet Take 1  tablet (0.5 mg total) by mouth every morning. 30 tablet 3  . pantoprazole (PROTONIX) 40 MG tablet TAKE 1 TABLET (40 MG TOTAL) BY MOUTH DAILY. (Patient not taking: Reported on 03/04/2015) 90 tablet 1  . polyethylene glycol powder (GLYCOLAX/MIRALAX) powder TAKE 1/2-1 CAPFUL (9-17 G) BY MOUTH ONCE DAILY 500 g 1  . potassium chloride (K-DUR) 10 MEQ tablet Take 1 tablet (10 mEq total) by mouth daily. 30 tablet 0  . PROAIR HFA 108 (90 BASE) MCG/ACT inhaler TAKE   TWO   PUFFS BY MOUTH   EVERY SIX HOURS   AS NEEDED   FOR WHEEZING 8 g 3  . propranolol (INDERAL) 20 MG tablet TAKE 1 TABLET (20 MG TOTAL) BY MOUTH DAILY. 90 tablet 3  . rifaximin (XIFAXAN) 550 MG TABS tablet Take 1 tablet (550 mg total) by mouth 2 (two) times daily. 60 tablet 3  . Sodium Chloride-Sodium Bicarb (CLASSIC NETI POT SINUS WASH) 2300-700 MG KIT Please use as directed in the package. (Patient not taking: Reported on 01/05/2015) 1 each 0  . SPIRIVA HANDIHALER 18 MCG inhalation capsule PLACE 1 CAPSULE (18 MCG TOTAL) INTO INHALER AND INHALE DAILY. 30 capsule 6  . spironolactone (ALDACTONE) 25 MG tablet Take 0.5 tablets (12.5 mg total) by mouth daily. 30 tablet 0  . traMADol (ULTRAM) 50 MG tablet Take 1 tablet by mouth every 6 hours as needed for pain 120 tablet 0   No current facility-administered medications for this visit.   Family History  Problem Relation Age of Onset  . Cervical cancer Sister   . Breast cancer Maternal Aunt   . Heart disease Father 97    died from MI  . Breast cancer Father   . Cervical cancer Daughter   . Colon cancer Paternal Aunt 59   History   Social History  . Marital Status: Legally Separated    Spouse Name: N/A  . Number of Children: N/A  . Years of Education: N/A   Social History Main Topics  . Smoking status: Former Smoker -- 0.10 packs/day for 40 years    Types: Cigarettes    Quit date: 03/28/2012  . Smokeless tobacco: Never Used  . Alcohol Use: 0.0 oz/week    0 Standard drinks or  equivalent per week     Comment: months since last drink  . Drug Use: No  . Sexual Activity: Not on file   Other Topics Concern  . Not on file   Social History Narrative   Review of Systems: Review of Systems  Constitutional: Positive for chills and malaise/fatigue. Negative for fever.  Eyes: Negative for blurred vision.  Respiratory: Negative for cough, shortness of breath and wheezing.   Cardiovascular: Positive for leg swelling (chronic b/l LE). Negative for chest pain.  Gastrointestinal: Positive for nausea (occasionally) and abdominal pain (chronic RUQ). Negative for vomiting, diarrhea, constipation, blood in stool and melena.  Genitourinary: Negative for dysuria, urgency, frequency and hematuria.  Musculoskeletal: Positive for joint pain (chronic right ankle). Negative for myalgias.  Neurological: Positive for tremors (of hands ) and weakness. Negative for dizziness, focal weakness and headaches.       Confused at times  Endo/Heme/Allergies: Does not bruise/bleed easily.    Objective:  Physical Exam: Filed Vitals:   05/11/15 1320  BP: 157/80  Pulse: 49  Temp: 97.7 F (36.5 C)  TempSrc: Oral  Height: '5\' 3"'  (1.6 m)  Weight: 180 lb 6.4 oz (81.829 kg)  SpO2: 95%    Physical Exam  Constitutional: She is oriented to person, place, and time. No distress.  Chronically ill-appearing  HENT:  Head: Normocephalic and atraumatic.  Right Ear: External ear normal.  Left Ear: External ear normal.  Nose: Nose normal.  Mouth/Throat: Oropharynx is clear and moist. No oropharyngeal exudate.  Eyes: Conjunctivae and EOM are normal. Pupils are equal, round, and reactive to light. Right eye exhibits no discharge. Left eye exhibits no discharge. Scleral icterus (mild b/l) is present.  Neck: Normal range of motion. Neck supple.  Cardiovascular: Normal rate.   Pulmonary/Chest: Effort normal. No respiratory distress. She has wheezes (mild diffuse expiratory ). She has no rales.    Abdominal: Soft. Bowel sounds are normal. She exhibits distension (mild ). There is no tenderness. There is no rebound and no guarding.  Musculoskeletal: Normal range of motion. She exhibits edema (trace to +1 b/l LE). She exhibits no tenderness.  Neurological: She is alert and oriented to person, place, and time.  Asterixis (R>L). Slow to respond to questions.  Skin: Skin is warm and dry. She is not diaphoretic.  Psychiatric: She has a normal mood and affect. Her behavior is normal. Judgment and thought content normal.    Assessment & Plan:   Please see problem list for problem-based assessment and plan

## 2015-05-12 LAB — CBC
HCT: 39.6 % (ref 36.0–46.0)
Hemoglobin: 13 g/dL (ref 12.0–15.0)
MCH: 31.6 pg (ref 26.0–34.0)
MCHC: 32.8 g/dL (ref 30.0–36.0)
MCV: 96.4 fL (ref 78.0–100.0)
MPV: 11.9 fL (ref 8.6–12.4)
PLATELETS: 92 10*3/uL — AB (ref 150–400)
RBC: 4.11 MIL/uL (ref 3.87–5.11)
RDW: 17.9 % — AB (ref 11.5–15.5)
WBC: 4.4 10*3/uL (ref 4.0–10.5)

## 2015-05-12 LAB — ANEMIA PANEL
%SAT: 36 % (ref 20–55)
ABS Retic: 135.6 10*3/uL (ref 19.0–186.0)
FERRITIN: 138 ng/mL (ref 10–291)
FOLATE: 9.2 ng/mL
IRON: 80 ug/dL (ref 42–145)
RBC.: 4.11 MIL/uL (ref 3.87–5.11)
Retic Ct Pct: 3.3 % — ABNORMAL HIGH (ref 0.4–2.3)
TIBC: 220 ug/dL — ABNORMAL LOW (ref 250–470)
UIBC: 140 ug/dL (ref 125–400)
Vitamin B-12: 963 pg/mL — ABNORMAL HIGH (ref 211–911)

## 2015-05-12 LAB — HIV ANTIBODY (ROUTINE TESTING W REFLEX): HIV 1&2 Ab, 4th Generation: NONREACTIVE

## 2015-05-12 LAB — VITAMIN D 25 HYDROXY (VIT D DEFICIENCY, FRACTURES): VIT D 25 HYDROXY: 13 ng/mL — AB (ref 30–100)

## 2015-05-12 NOTE — Assessment & Plan Note (Addendum)
Assessment: Pt with decompensated liver cirrhosis with hepatic encephalopathy semi-compliant with medical therapy who presents with mild confusion and asterixis.   Plan:  -Refill lactulose 20 g (48mL) daily, pt instructed on importance of taking this medication, titrate for 2-3 BM's daily  -Continue rifaximin 550 mg BID -Pt to return in 2 weeks

## 2015-05-12 NOTE — Assessment & Plan Note (Addendum)
Assessment: Pt with chronic liver cirrhosis with macrocytic anemia of 27-month duration with history of esophageal varies and screening colonoscopy on 04/26/09 with pedunculated sigmoid polyp who presents with no active bleeding or hemodynamic instability.   Plan:  -Obtain CBC w/diff ---> Hg improved to 13 from 10.9 on 03/08/15, Plt count 92K near baseline -Obtain anemia panel ---> normal with elevated B12 level of 963 not on supplementation -Obtain TSH level ---> normal -Continue propanol 20 mg daily for esophageal varices -Pt declined screening colonoscopy at this time, inquire again at later time

## 2015-05-12 NOTE — Assessment & Plan Note (Addendum)
Assessment: Pt with history of alcohol abuse and treated HCV genotype 1A January 2016 with undetectable VL on 03/05/15 with grade 2 stage II-III cirrhosis s/p liver biopsy in 2009 and last abdominal US on 03/05/15 with cirrhosis and splenomegaly who presents with no acute decompensation.   Plan:  -Obtain CMP --> improved bili from 7.5 to 4.5, AST 46 to 43, Alk Phos 141 to 123 and normal corr calcium 9.5 -Obtain HIV Ab ---> non-reactive  -Continue propanolol 20 mg daily for esophageal varices -Continue lactulose 20 mg daily and rifaximin 550 mg BID for hepatic encephalopathy  -Continue furosemide 20 mg BID and increase spironolactone 12.5 mg daily to 25 mg daily (ideally should be on 100:40 ratio of spironolactone to lasix) for ascites  -Pt needs pneumococcal vaccination, inquire at next visit -Consider AFP testing at next visit -Pt follows with Dr. Patsy Baltimore and Dr. Olevia Perches

## 2015-05-12 NOTE — Progress Notes (Signed)
[facility] Clinic Attending  Case discussed with Dr. Rabbani soon after the resident saw the patient.  We reviewed the resident's history and exam and pertinent patient test results.  I agree with the assessment, diagnosis, and plan of care documented in the resident's note.  

## 2015-05-12 NOTE — Assessment & Plan Note (Addendum)
-  Obtain screening 25-OH vitamin D level -Pt declined screening colonoscopy at this time, inquire again at later time   ADDENDUM 05/13/15: Pt with vitamin D deficiency with 25-OH vitamin level of 13. Pt instructed to take ergocalciferol 50K U weekly for 8 weeks with repeat levels after completion of therapy.

## 2015-05-12 NOTE — Assessment & Plan Note (Signed)
Assessment: Pt with recent hypokalemia most likely due to diuretic therapy and poor PO intake who presents with normalized potassium level.  Plan:  -Obtain CMP ---> K normal at 3.6 -Discontinue kdur 10 mEq daily  -Increase spironolactone 12.5 mg daily to 25 mg daily and continue lasix 20 mg BID -Obtain BMP at next visit

## 2015-05-12 NOTE — Assessment & Plan Note (Addendum)
Assessment: Pt with well-controlled hypertension compliant with two-class (BB & diuretic) anti-hypertensive therapy who presents with blood pressure of 157/80.  Plan: -BP 157/80 not at goal <140/90 -Continue propanolol 20 mg daily and furosemide 20 mg BID -Increase spironolactone 12.5 mg daily to 25 mg daily  -Ideally should be on 100:40 ratio of spironolactone to lasix for ascites -Obtain CMP ---> normal potassium and renal function -Obtain TSH level in setting of bradycardia ---> normal  -Discontinue kdur 10 mEq daily

## 2015-05-13 DIAGNOSIS — E559 Vitamin D deficiency, unspecified: Secondary | ICD-10-CM | POA: Insufficient documentation

## 2015-05-13 MED ORDER — VITAMIN D (ERGOCALCIFEROL) 1.25 MG (50000 UNIT) PO CAPS: 50000.0000 [IU] | ORAL_CAPSULE | ORAL | Status: DC

## 2015-05-13 NOTE — Addendum Note (Signed)
Addended byJuluis Mire on: 05/13/2015 10:32 PM   Modules accepted: Orders

## 2015-05-13 NOTE — Assessment & Plan Note (Signed)
Assessment: Pt with vitamin D deficiency with level of 13 on 05/11/15 with no recent fall or fracture.   Plan:  -Prescribe ergocalciferol 50K U weekly for 8 weeks -Repeat 25-OH vitamin D level after completion of therapy

## 2015-05-23 ENCOUNTER — Other Ambulatory Visit: Payer: Self-pay | Admitting: Pulmonary Disease

## 2015-05-23 ENCOUNTER — Other Ambulatory Visit: Payer: Self-pay | Admitting: Internal Medicine

## 2015-05-24 NOTE — Telephone Encounter (Signed)
Called to pharm 

## 2015-05-24 NOTE — Telephone Encounter (Signed)
Refilling Tramadol 50mg  1 tablet q6hr prn pain #120 and no refills.

## 2015-05-25 ENCOUNTER — Ambulatory Visit (INDEPENDENT_AMBULATORY_CARE_PROVIDER_SITE_OTHER): Payer: Medicaid Other | Admitting: Internal Medicine

## 2015-05-25 ENCOUNTER — Encounter: Payer: Self-pay | Admitting: Internal Medicine

## 2015-05-25 VITALS — BP 103/75 | HR 56 | Temp 98.0°F | Ht 63.0 in | Wt 171.5 lb

## 2015-05-25 DIAGNOSIS — F418 Other specified anxiety disorders: Secondary | ICD-10-CM

## 2015-05-25 DIAGNOSIS — F419 Anxiety disorder, unspecified: Secondary | ICD-10-CM

## 2015-05-25 DIAGNOSIS — K7460 Unspecified cirrhosis of liver: Secondary | ICD-10-CM

## 2015-05-25 DIAGNOSIS — Z87891 Personal history of nicotine dependence: Secondary | ICD-10-CM | POA: Diagnosis not present

## 2015-05-25 DIAGNOSIS — F1021 Alcohol dependence, in remission: Secondary | ICD-10-CM

## 2015-05-25 DIAGNOSIS — G4700 Insomnia, unspecified: Secondary | ICD-10-CM

## 2015-05-25 DIAGNOSIS — Z0000 Encounter for general adult medical examination without abnormal findings: Secondary | ICD-10-CM

## 2015-05-25 DIAGNOSIS — B182 Chronic viral hepatitis C: Secondary | ICD-10-CM

## 2015-05-25 DIAGNOSIS — I10 Essential (primary) hypertension: Secondary | ICD-10-CM | POA: Diagnosis present

## 2015-05-25 DIAGNOSIS — K7031 Alcoholic cirrhosis of liver with ascites: Secondary | ICD-10-CM | POA: Diagnosis not present

## 2015-05-25 DIAGNOSIS — Z23 Encounter for immunization: Secondary | ICD-10-CM

## 2015-05-25 DIAGNOSIS — K7290 Hepatic failure, unspecified without coma: Secondary | ICD-10-CM

## 2015-05-25 DIAGNOSIS — K7682 Hepatic encephalopathy: Secondary | ICD-10-CM

## 2015-05-25 MED ORDER — MELATONIN 1 MG PO CAPS: 1.0000 | ORAL_CAPSULE | Freq: Every day | ORAL | Status: DC

## 2015-05-25 MED ORDER — PROMETHAZINE HCL 12.5 MG PO TABS: 12.5000 mg | ORAL_TABLET | Freq: Three times a day (TID) | ORAL | Status: DC | PRN

## 2015-05-25 MED ORDER — PROMETHAZINE HCL 25 MG PO TABS
25.0000 mg | ORAL_TABLET | Freq: Once | ORAL | Status: AC
  Administered 2015-05-25: 25 mg via ORAL

## 2015-05-25 NOTE — Assessment & Plan Note (Addendum)
Assessment: Pt with well-controlled hypertension compliant with two-class (BB & diuretic) anti-hypertensive therapy who presents with improved blood pressure of 103/75.  Plan: -BP 103/75 at goal <140/90 -Continue propanolol 20 mg daily -Continue furosemide 20 mg BID and spironolactone 25 mg daily, ideally should be on 100:40 ratio of spironolactone to lasix for ascites -Last CMP on 05/11/15 with normal potassium and renal function

## 2015-05-25 NOTE — Progress Notes (Signed)
[name], female   DOB: Jun 07, 1961, 54 y.o.   MRN: [id number]    Subjective:   [name] female   DOB: 03/23/1961 54 y.o.   MRN: [id number]  HPI: [name] [name] is a 54 y.o. pleasant woman with past medical history of COPD, chronic Hepatitis C genotype 1, alcoholic cirrhosis with esophageal varies and chronic thrombocytopenia, cervical cancer s/p TAH 2000, chronic right foot pain, anxiety, and depression who presents for follow-up visit of blood pressure and hepatic encephalopathy.    She was last seen on 7/14 and her blood pressure was elevated at 157/80. She was instructed to increase her spironolactone from 12.5 mg daily to 25 mg daily and take lasix 20 mg BID (was taking it once a day) which she reports compliance with. She is also on propanol 20 mg daily which she reports compliance with. She has chronic lightheadedness with standing but denies abdominal distension, dyspnea, chest pain, blurry vision, or LE edema from baseline. She has lost 9 lb since last visit 2 weeks ago. She limits her salt intake.   At last visit she was noncompliant with taking lactulose but was compliant with taking rifaximin 550 BID with only 1 BM daily. She reports now taking lactulose 20 g daily with 2-3 BM's daily and sometimes may take the lactulose twice daily. Per her husband she is at her baseline mental status.  She reports chronic insomnia and anxiety. She reports compliance with taking buspar, ativan, and atarax. She recently saw her psychiatrist Dr. Casimiro Needle on 05/10/15. She has never tried melatonin in the past for insomnia. She was previously on klonopin and remeron that were discontinued at last hospitalization in setting of her chronic liver disease.    She has history of alcoholic liver cirrhosis and treated hepatitis C infection. Last abdominal US on 03/05/15 revealed liver cirrhosis with portal venous hypertension in setting of ascites and splenomegaly. She is followed by Dr.  Patsy [place] and reports being on a liver transplant list but has not seen him in awhile and has labs drawn every 2-3 weeks which she is not aware of the results. She reports nausea without vomiting and chronic RUQ abdominal pain. She does not take anti-nausea medication. She is on tramadol as needed for pain.    Past Medical History  Diagnosis Date  . Alcoholic cirrhosis   . COPD (chronic obstructive pulmonary disease)   . Heart palpitations   . Anxiety   . Portal hypertension   . Esophageal varices   . Cholelithiasis   . Splenomegaly   . Fatty liver   . Esophagitis 2010  . Tubulovillous adenoma of colon   . Visual hallucination     since 09/2010/notes 10/30/2012  . Hepatitis C     chronic hepatitis C and Steatohepatitis (hep grade 2, stage 2-3) per 05/13/08 liver biopsy  . Obesity   . cervical Cancer 1999  . Depression   . Shortness of breath   . Allergy   . Arthritis   . GERD (gastroesophageal reflux disease)   . Hypertension   . Gastritis   . Decompensated hepatic cirrhosis 03/07/2015    Side effect of Vikera  . Adverse drug effect - [facility] 03/07/2015   Current Outpatient Prescriptions  Medication Sig Dispense Refill  . albuterol (PROVENTIL) (2.5 MG/3ML) 0.083% nebulizer solution Take 2.5 mg by nebulization 2 (two) times daily.    . busPIRone (BUSPAR) 5 MG tablet 1 bid  With food 60 tablet 5  . COMBIVENT RESPIMAT  20-100 MCG/ACT AERS respimat INHALE 1 PUFF INTO THE LUNGS EVERY 6 (SIX) HOURS AS NEEDED FOR WHEEZING. 4 g 3  . fluconazole (DIFLUCAN) 100 MG tablet Take 1 tablet by mouth once per week x 12 weeks. 12 tablet 0  . fluticasone (FLONASE) 50 MCG/ACT nasal spray PLACE 1 SPRAY INTO BOTH NOSTRILS DAILY. 16 g 2  . furosemide (LASIX) 20 MG tablet Take 1 tablet (20 mg total) by mouth 2 (two) times daily. 60 tablet 1  . hydrOXYzine (ATARAX/VISTARIL) 50 MG tablet 1 qhs may repeat 60 tablet 3  . lactulose (CHRONULAC) 10 GM/15ML solution Take 39 ml (20 grams) by mouth daily 1350 mL 1    . LORazepam (ATIVAN) 0.5 MG tablet Take 1 tablet (0.5 mg total) by mouth every morning. 30 tablet 3  . pantoprazole (PROTONIX) 40 MG tablet TAKE 1 TABLET (40 MG TOTAL) BY MOUTH DAILY. (Patient not taking: Reported on 03/04/2015) 90 tablet 1  . polyethylene glycol powder (GLYCOLAX/MIRALAX) powder TAKE 1/2-1 CAPFUL (9-17 G) BY MOUTH ONCE DAILY 527 g 2  . PROAIR HFA 108 (90 BASE) MCG/ACT inhaler TAKE   TWO   PUFFS BY MOUTH   EVERY SIX HOURS   AS NEEDED   FOR WHEEZING 8 g 3  . propranolol (INDERAL) 20 MG tablet TAKE 1 TABLET (20 MG TOTAL) BY MOUTH DAILY. 90 tablet 3  . rifaximin (XIFAXAN) 550 MG TABS tablet Take 1 tablet (550 mg total) by mouth 2 (two) times daily. 60 tablet 3  . Sodium Chloride-Sodium Bicarb (CLASSIC NETI POT SINUS WASH) 2300-700 MG KIT Please use as directed in the package. (Patient not taking: Reported on 01/05/2015) 1 each 0  . SPIRIVA HANDIHALER 18 MCG inhalation capsule PLACE 1 CAPSULE (18 MCG TOTAL) INTO INHALER AND INHALE DAILY. 30 capsule 6  . spironolactone (ALDACTONE) 25 MG tablet Take 1 tablet (25 mg total) by mouth daily. 30 tablet 1  . traMADol (ULTRAM) 50 MG tablet TAKE ONE TABLET   BY MOUTH   EVERY SIX HOURS   AS NEEDED   FOR PAIN 120 tablet 0  . Vitamin D, Ergocalciferol, (DRISDOL) 50000 UNITS CAPS capsule Take 1 capsule (50,000 Units total) by mouth every 7 (seven) days. 8 capsule 0   No current facility-administered medications for this visit.   Family History  Problem Relation Age of Onset  . Cervical cancer Sister   . Breast cancer Maternal Aunt   . Heart disease Father 42    died from MI  . Breast cancer Father   . Cervical cancer Daughter   . Colon cancer Paternal Aunt 63   History   Social History  . Marital Status: Legally Separated    Spouse Name: N/A  . Number of Children: N/A  . Years of Education: N/A   Social History Main Topics  . Smoking status: Former Smoker -- 0.10 packs/day for 40 years    Types: Cigarettes    Quit date: 03/28/2012  .  Smokeless tobacco: Never Used  . Alcohol Use: 0.0 oz/week    0 Standard drinks or equivalent per week     Comment: months since last drink  . Drug Use: No  . Sexual Activity: Not on file   Other Topics Concern  . Not on file   Social History Narrative   Review of Systems:  Review of Systems  Constitutional: Positive for chills and malaise/fatigue (chronic). Negative for fever.  Eyes: Negative for blurred vision.  Respiratory: Negative for cough, shortness of breath and wheezing.  Cardiovascular: Positive for leg swelling (chronic b/l LE). Negative for chest pain.  Gastrointestinal: Positive for nausea and abdominal pain (chronic RUQ). Negative for heartburn (controlled on medical therapy ), vomiting, diarrhea, constipation and blood in stool.  Genitourinary: Negative for dysuria, urgency, frequency and hematuria.  Musculoskeletal: Positive for joint pain (chronic right ankle).  Neurological: Positive for dizziness (chronic) and headaches.  Psychiatric/Behavioral: The patient is nervous/anxious and has insomnia.     Objective:  Physical Exam: Filed Vitals:   05/25/15 1326  BP: 103/75  Pulse: 56  Temp: 98 F (36.7 C)  TempSrc: Oral  Height: '5\' 3"'  (1.6 m)  Weight: 171 lb 8 oz (77.792 kg)  SpO2: 94%   Physical Exam  Constitutional: She is oriented to person, place, and time. No distress.  Chronically ill-appearing  HENT:  Head: Normocephalic and atraumatic.  Eyes: EOM are normal.  Neck: Normal range of motion. Neck supple.  Cardiovascular: Normal rate, regular rhythm and normal heart sounds.   Pulmonary/Chest: Effort normal. No respiratory distress. She has no wheezes. She has no rales.  Decreased BS at bases  Abdominal: Soft. Bowel sounds are normal. She exhibits no distension. There is no tenderness. There is no rebound and no guarding.  Musculoskeletal: Normal range of motion. She exhibits edema (trace b/l LE). She exhibits no tenderness.  Neurological: She is alert  and oriented to person, place, and time.  Mild asterixis (R>L)  Skin: Skin is warm and dry. No rash noted. She is not diaphoretic. No erythema. No pallor.  Psychiatric: She has a normal mood and affect. Her behavior is normal. Judgment and thought content normal.    Assessment & Plan:   Please see problem list for problem-based assessment and plan

## 2015-05-25 NOTE — Patient Instructions (Addendum)
-  Take phenergan every 8 hrs as needed for nausea -Take melatonin every night for sleep -Please let Dr. Casimiro Needle know you are still having anxiety -Will call you for appointment with Dr. Patsy Baltimore -Will give you a pneumonia shot today  -Please come back in 3 months for follow-up, nice seeing you again!  General Instructions:   Please bring your medicines with you each time you come to clinic.  Medicines may include prescription medications, over-the-counter medications, herbal remedies, eye drops, vitamins, or other pills.   Progress Toward Treatment Goals:  Treatment Goal 03/29/2015  Blood pressure at goal    Self Care Goals & Plans:  Self Care Goal 05/25/2015  Manage my medications take my medicines as prescribed; bring my medications to every visit; refill my medications on time  Monitor my health -  Eat healthy foods eat more vegetables; eat foods that are low in salt; eat baked foods instead of fried foods  Be physically active find an activity I enjoy  Prevent falls have my vision checked; wear appropriate shoes    No flowsheet data found.   Care Management & Community Referrals:  Referral 01/13/2013  Referrals made for care management support none needed

## 2015-05-25 NOTE — Assessment & Plan Note (Addendum)
-  Pt given PPSV23 vaccination today in setting of chronic liver disease -Pt declined screening colonoscopy at this time, inquire again at next visit

## 2015-05-25 NOTE — Assessment & Plan Note (Signed)
Assessment: Pt with chronic insomnia compliant with anxiolytic therapy most likely in setting of uncontrolled anxiety, pain, and nocturia.  Plan:  -Prescribe melatonin 1 mg daily at bedtime -Pt instructed to try taking lasix 40 mg daily in AM instead of 20 mg BID due to nocturia -Continue tramadol 50 mg Q 6 hr PRN chronic abdominal pain

## 2015-05-25 NOTE — Assessment & Plan Note (Signed)
Assessment: Pt with decompensated liver cirrhosis with hepatic encephalopathy now compliant with medical therapy who presents with no mental status changes.  Plan:  -Continue  lactulose 20 g (66mL) daily, pt instructed to self-titrate for 2-3 BM's daily  -Continue rifaximin 550 mg BID

## 2015-05-25 NOTE — Assessment & Plan Note (Addendum)
Assessment: Pt with anxiety disorder compliant with anxiolytic therapy who presents with uncontrolled symptoms.   Plan:  -Continue Ativan 0.5 mg daily, buspar 1 mg BID, and atarax 50 mg at bedtime (may repeat) -Pt instructed to contact her psychiatrist Dr. Casimiro Needle for earlier appointment (next appointment is 08/16/15)

## 2015-05-25 NOTE — Assessment & Plan Note (Addendum)
Assessment: Pt with history of alcohol abuse and treated HCV genotype 1A January 2016 with undetectable VL on 03/05/15 with grade 2 stage II-III cirrhosis s/p liver biopsy in 2009 and last abdominal US on 03/05/15 with cirrhosis and splenomegaly who presents with no acute decompensation.   Plan:  -Last CMP on 7/14 with improved liver function   -Prescribe phenergan 12.5 mg Q 8 hr PRN nausea, pt given 10 samples of phenergan 25 mg  -Continue tramadol 50 mg Q 6 hr PRN chronic abdominal pain  -Continue propanolol 20 mg daily for esophageal varices -Continue rifaximin 550 mg BID and lactulose 20 mg daily with self-titration for 2-3 BM's for hepatic encephalopathy  -Continue furosemide 20 mg BID and spironolactone 25 mg daily (ideally should be on 100:40 ratio of spironolactone to lasix) for ascites  -Administer PPSV23 vaccination today  -Consider AFP testing at next visit -Pt follows with Dr. Patsy Baltimore, I will call their office to schedule an appointment

## 2015-05-26 NOTE — Progress Notes (Signed)
Medicine attending: Medical history, presenting problems, physical findings, and medications, reviewed with Dr Marjan Rabbani on the day of the patient visit and I concur with her evaluation and management plan. 

## 2015-06-05 NOTE — Addendum Note (Signed)
Addended by: Orson Gear on: 06/05/2015 02:50 PM   Modules accepted: Orders

## 2015-06-07 ENCOUNTER — Other Ambulatory Visit (HOSPITAL_COMMUNITY): Payer: Self-pay | Admitting: Psychiatry

## 2015-06-07 ENCOUNTER — Other Ambulatory Visit: Payer: Self-pay | Admitting: Internal Medicine

## 2015-06-14 ENCOUNTER — Other Ambulatory Visit: Payer: Self-pay | Admitting: Pulmonary Disease

## 2015-06-14 ENCOUNTER — Other Ambulatory Visit: Payer: Self-pay | Admitting: Internal Medicine

## 2015-06-26 ENCOUNTER — Other Ambulatory Visit (HOSPITAL_COMMUNITY): Payer: Self-pay | Admitting: Psychiatry

## 2015-06-26 ENCOUNTER — Other Ambulatory Visit: Payer: Self-pay | Admitting: Pulmonary Disease

## 2015-06-26 NOTE — Telephone Encounter (Signed)
Pt called requesting tramadol to be filled.Please call pt back.

## 2015-06-27 MED ORDER — TRAMADOL HCL 50 MG PO TABS: 50.0000 mg | ORAL_TABLET | Freq: Four times a day (QID) | ORAL | Status: DC | PRN

## 2015-06-27 NOTE — Telephone Encounter (Signed)
Called to pharm 

## 2015-06-28 ENCOUNTER — Other Ambulatory Visit: Payer: Self-pay | Admitting: Pulmonary Disease

## 2015-06-28 ENCOUNTER — Other Ambulatory Visit: Payer: Self-pay | Admitting: Internal Medicine

## 2015-07-04 ENCOUNTER — Telehealth (HOSPITAL_COMMUNITY): Payer: Self-pay

## 2015-07-04 NOTE — Telephone Encounter (Signed)
Medication management - - Telephone call with patient to follow up on message left she was having problems with Buspar and did not think it was helping with anxiety.  Would like Dr. Casimiro Needle to consider placing her back on Ativan more than once a day.  Informed patient Dr. Casimiro Needle would not be back in this office until 07/05/15 but would send request to him and requested patient call back 07/05/15 evening for follow up.  Patient agreed with plan and agreed to send request to provider.

## 2015-07-05 ENCOUNTER — Other Ambulatory Visit: Payer: Self-pay | Admitting: Internal Medicine

## 2015-07-05 NOTE — Telephone Encounter (Signed)
Vit D one time Rx - needs level rechecked prior to cont the Vit D.

## 2015-07-05 NOTE — Telephone Encounter (Signed)
Medication management - Met with Dr. Casimiro Needle who did not want patient to return to increased Ativan dosage but to make an earlier appointment to come in and discuss if felt necessary. Patient wants to make an earlier appt. and would like a Trazodone new order.  Informed Dr. Chauncy Passy would be out of the office until another week and would have front desk staff call her back to schedule first available.

## 2015-07-12 ENCOUNTER — Other Ambulatory Visit (HOSPITAL_COMMUNITY): Payer: Self-pay | Admitting: Psychiatry

## 2015-07-12 ENCOUNTER — Telehealth (HOSPITAL_COMMUNITY): Payer: Self-pay

## 2015-07-12 DIAGNOSIS — F331 Major depressive disorder, recurrent, moderate: Secondary | ICD-10-CM

## 2015-07-12 MED ORDER — BUSPIRONE HCL 10 MG PO TABS: 10.0000 mg | ORAL_TABLET | Freq: Two times a day (BID) | ORAL | Status: DC

## 2015-07-12 MED ORDER — TRAZODONE HCL 50 MG PO TABS: 50.0000 mg | ORAL_TABLET | Freq: Every day | ORAL | Status: DC

## 2015-07-12 NOTE — Telephone Encounter (Signed)
Met with Dr. Casimiro Needle to discuss patient's medication and continued reported problems with anxiety.  Dr. Casimiro Needle reviewed patient's record and ordered for patient to increase Buspar to 10 mg one twice a day and to restart Trazodone at 50 mg one at bedtime as patient requests to restart this due to problems with sleep.  Discussed with Dr. Casimiro Needle this was stopped when patient was in the hospital and reminded of hepatic concerns. Dr. Casimiro Needle authorized to restart Trazodone at a lower 50 mg dosage, one at bedtime.  Arranged also for patient to come in earlier on 07/26/15 at 1 pm and left patient a message about medications being ordered for her and changed appointment time.  Requested Ms. Ramser call back to confirm she had gotten this message and new Buspar and Trazodone orders e-scribed to patient's [employer] as requested by Dr. Casimiro Needle.

## 2015-07-12 NOTE — Telephone Encounter (Signed)
Telephone call with patient after she left another message today to question if she had gotten this nurse's earlier message and changes Dr. Casimiro Needle ordered in her medication?  Reviewed Dr. Karen Chafe changes with increase in Buspar to 10mg  one BID and restart of Trazodone at 50mg  one at bedtime.  Also, informed of earlier appointment set for 07/26/15 at 1pm and patient agreed to keep earlier appointment and to try changes in medication to see if would help with current anxiety.  Patient to call back as needed.

## 2015-07-21 ENCOUNTER — Other Ambulatory Visit (HOSPITAL_COMMUNITY): Payer: Self-pay | Admitting: Psychiatry

## 2015-07-26 ENCOUNTER — Ambulatory Visit (HOSPITAL_COMMUNITY): Payer: Self-pay | Admitting: Psychiatry

## 2015-07-26 ENCOUNTER — Other Ambulatory Visit: Payer: Self-pay | Admitting: Pulmonary Disease

## 2015-07-26 NOTE — Telephone Encounter (Signed)
Pt called requesting tramadol to be filled. °

## 2015-07-26 NOTE — Telephone Encounter (Signed)
This has been requested

## 2015-07-27 NOTE — Telephone Encounter (Signed)
Called to pharm 

## 2015-08-07 ENCOUNTER — Other Ambulatory Visit (HOSPITAL_COMMUNITY): Payer: Self-pay | Admitting: Psychiatry

## 2015-08-10 ENCOUNTER — Other Ambulatory Visit: Payer: Self-pay | Admitting: Internal Medicine

## 2015-08-10 ENCOUNTER — Other Ambulatory Visit: Payer: Self-pay | Admitting: Pulmonary Disease

## 2015-08-14 ENCOUNTER — Other Ambulatory Visit: Payer: Self-pay | Admitting: Pulmonary Disease

## 2015-08-16 ENCOUNTER — Ambulatory Visit (INDEPENDENT_AMBULATORY_CARE_PROVIDER_SITE_OTHER): Payer: Medicaid Other | Admitting: Psychiatry

## 2015-08-16 ENCOUNTER — Ambulatory Visit (HOSPITAL_COMMUNITY): Payer: Self-pay | Admitting: Psychiatry

## 2015-08-16 VITALS — BP 131/81 | HR 63 | Ht 63.0 in | Wt 184.2 lb

## 2015-08-16 DIAGNOSIS — F331 Major depressive disorder, recurrent, moderate: Secondary | ICD-10-CM | POA: Diagnosis not present

## 2015-08-16 MED ORDER — DOXEPIN HCL 10 MG PO CAPS: ORAL_CAPSULE | ORAL | Status: DC

## 2015-08-16 MED ORDER — BUSPIRONE HCL 15 MG PO TABS: ORAL_TABLET | ORAL | Status: DC

## 2015-08-16 MED ORDER — LORAZEPAM 1 MG PO TABS: ORAL_TABLET | ORAL | Status: DC
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
## 2015-08-18 ENCOUNTER — Other Ambulatory Visit: Payer: Self-pay | Admitting: Internal Medicine

## 2015-08-18 ENCOUNTER — Telehealth: Payer: Self-pay | Admitting: Pulmonary Disease

## 2015-08-18 ENCOUNTER — Other Ambulatory Visit: Payer: Self-pay | Admitting: Pulmonary Disease

## 2015-08-18 NOTE — Telephone Encounter (Signed)
Tramadol was filled for 30 days on 9/29 at [facility], this was confirmed with the pharmacy. Pt was notified, she stated her pain is "bad" and then that she lost her bottle and needed to have it replaced with an early refill. She was informed that this could not be done. She then complained of resp. Issues, coughing, congestion, productive w/ green mucous, she stated" i will have pneumonia before i get seen down there", she was informed if there were pm cancellations today she would be called and could possibly be seen today, she stated no, she couldn't come in today, Monday was offered and she said well i need my tramadol, she was informed the visit would be for the resp issues she is having and the tramadol would be filled on 10/29. She said she would go to urg care because she didn t have the money to be seen in clinic, she was informed that urg care would possibly ask for payment at the time of service and it would be more than the visit here. She was scheduled for mon 10/24 at 1545 dr ahmed for the resp issues only and tramadol will be filled 10/29 or when dr Randell Patient approves this, spoke to dr Software engineer and will send this note to dr butcher, dr Genene Churn and dr Randell Patient

## 2015-08-18 NOTE — Telephone Encounter (Signed)
Pt requesting tramadol to be filled. Please call pt back.

## 2015-08-21 ENCOUNTER — Other Ambulatory Visit: Payer: Self-pay | Admitting: Pulmonary Disease

## 2015-08-21 ENCOUNTER — Ambulatory Visit (INDEPENDENT_AMBULATORY_CARE_PROVIDER_SITE_OTHER): Payer: Medicaid Other | Admitting: Internal Medicine

## 2015-08-21 ENCOUNTER — Encounter: Payer: Self-pay | Admitting: Internal Medicine

## 2015-08-21 VITALS — BP 107/74 | HR 66 | Temp 97.4°F | Wt 187.9 lb

## 2015-08-21 DIAGNOSIS — J029 Acute pharyngitis, unspecified: Secondary | ICD-10-CM

## 2015-08-21 DIAGNOSIS — Z23 Encounter for immunization: Secondary | ICD-10-CM

## 2015-08-21 DIAGNOSIS — H9203 Otalgia, bilateral: Secondary | ICD-10-CM

## 2015-08-21 DIAGNOSIS — R05 Cough: Secondary | ICD-10-CM | POA: Diagnosis not present

## 2015-08-21 DIAGNOSIS — J069 Acute upper respiratory infection, unspecified: Secondary | ICD-10-CM | POA: Insufficient documentation

## 2015-08-21 MED ORDER — AZITHROMYCIN 250 MG PO TABS: 250.0000 mg | ORAL_TABLET | Freq: Every day | ORAL | Status: DC

## 2015-08-21 MED ORDER — DM-GUAIFENESIN ER 30-600 MG PO TB12
1.0000 | ORAL_TABLET | Freq: Two times a day (BID) | ORAL | Status: DC
Start: 2015-08-21 — End: 2015-11-23

## 2015-08-21 NOTE — Progress Notes (Signed)
   Subjective:    [name], female    DOB: 1961-03-23, 54 y.o.   MRN: [id number]  HPI  54 yo female with hx of chronic pain, Chronic Hep C and Hepatic cirrhosis, esophageal varices, moderate COPD, chronic venous insufficiency, here for "cough and cold".  Called clinic on 08/18/15 requesting early tramadol refill because she lost her bottle. She received her last refill for 30 days on 9/29. When told that we cannot do that according to clinic policy, she mentioned she was having productive cough, congestion, and resp issues and felt like she was getting a Pneumonia. See the note from 08/18/15 for further details.  She states she has been having cough with green sputum, sore throat, and ear pain for 1.5 week. No fevers. May have had some diarrhea but she is on lactulose as well. States 2 sick contacts among family members. She states she took some "zipro" 3 tablets from her neighbor for last 3 days and starting to feel better with this. She could not tell me whther zipro = azithromycin or ciprofloaxin. Has been using her inhalers. No SOB.  Also asked about her tramadol refill, I stated this will have to be done by her PCP Dr. Randell Patient.   Wants flu shot.      Review of Systems  Constitutional: Negative for fever and chills.  HENT: Positive for congestion, ear pain, rhinorrhea, sinus pressure and sore throat. Negative for trouble swallowing and voice change.   Eyes: Negative for photophobia and visual disturbance.  Respiratory: Positive for cough. Negative for chest tightness, shortness of breath and wheezing.   Cardiovascular: Negative for chest pain and palpitations.  Gastrointestinal: Positive for diarrhea. Negative for nausea, vomiting, abdominal pain, constipation and abdominal distention.  Endocrine: Negative for polyphagia and polyuria.  Genitourinary: Negative for dysuria and difficulty urinating.  Musculoskeletal: Negative for back pain and arthralgias.  Skin: Negative.    Neurological: Negative for seizures, weakness, numbness and headaches.  Psychiatric/Behavioral: Negative for behavioral problems and agitation.       Objective:   Physical Exam  Constitutional: She is oriented to person, place, and time. She appears well-developed and well-nourished. No distress.  HENT:  Head: Normocephalic and atraumatic.  Right Ear: External ear normal.  Left Ear: External ear normal.  Mouth/Throat: No oropharyngeal exudate.  Has some posterior oropharynx erythema. No exudates.  Normal Tympanic membranes bilaterally.   Eyes: Pupils are equal, round, and reactive to light.  Neck: Normal range of motion. Neck supple.  Pulmonary/Chest: Effort normal and breath sounds normal. No respiratory distress. She has no wheezes. She has no rales. She exhibits no tenderness.  Abdominal: Soft. Bowel sounds are normal. She exhibits no distension. There is no tenderness. There is no rebound.  Musculoskeletal: Normal range of motion. She exhibits no edema or tenderness.  Neurological: She is alert and oriented to person, place, and time.  Skin: Skin is warm. She is not diaphoretic.     Filed Vitals:   08/21/15 1553  BP: 107/74  Pulse: 66  Temp: 97.4 F (36.3 C)       Assessment & Plan:  See problem based a&p.

## 2015-08-21 NOTE — Patient Instructions (Signed)
I think you had a viral infection which does not require antibiotic, however since he started feeling better with the antibiotic, I gave you 3 more days of Azithromycin to finish the course.  Also gave you Mucinex-DM for cough.  You can take tylenol safely upto 2000mg  daily even with her cirrhosis. I recommend you take this for short time to help with your sore throat and ear pain.  Let us know if you are not better in 1 week.

## 2015-08-22 NOTE — Assessment & Plan Note (Addendum)
Comes in with 1.5 week of productive cough, sore throat, b/l ear pain/fullness, and ?diarrhea. Had sick contacts. Sounds like a viral URI. However, patient stated she took 3 tablets of "zipro" for last 3 days. Not sure if she meant cipro or azithromycin. Since she took the abx, I will prescribe her azithromycin 250mg  (no loading) for 3 more days and finish the course to avoid resistance. However, I told her the main plan is symptomatic treatment. Will prescribe her Mucinex-DM for cough, asked to take her inhalers for COPD. No wheezing, vitals fine, I don't suspect she has a COPD exacerbation.   Gave flu shot as she is not febrile/septic.

## 2015-08-23 NOTE — Progress Notes (Signed)
Internal Medicine Clinic Attending  Case discussed with Dr. Ahmed at the time of the visit.  We reviewed the resident's history and exam and pertinent patient test results.  I agree with the assessment, diagnosis, and plan of care documented in the resident's note. 

## 2015-08-24 NOTE — Telephone Encounter (Signed)
Pt requesting tramadol to be filled.

## 2015-08-25 NOTE — Telephone Encounter (Signed)
rx called into pharmacy

## 2015-08-31 ENCOUNTER — Encounter: Payer: Self-pay | Admitting: Pulmonary Disease

## 2015-08-31 ENCOUNTER — Other Ambulatory Visit: Payer: Self-pay | Admitting: Pulmonary Disease

## 2015-09-16 ENCOUNTER — Other Ambulatory Visit: Payer: Self-pay | Admitting: Pulmonary Disease

## 2015-09-19 ENCOUNTER — Other Ambulatory Visit: Payer: Self-pay | Admitting: Pulmonary Disease

## 2015-09-19 ENCOUNTER — Encounter: Payer: Self-pay | Admitting: Student

## 2015-09-19 NOTE — Telephone Encounter (Signed)
Request already sent via surescripts on 11/19, will resend to PCP

## 2015-09-19 NOTE — Telephone Encounter (Signed)
Pt requesting tramadol to be filled. °

## 2015-09-20 NOTE — Telephone Encounter (Signed)
rx phoned in

## 2015-09-22 ENCOUNTER — Other Ambulatory Visit: Payer: Self-pay | Admitting: Internal Medicine

## 2015-10-05 ENCOUNTER — Encounter: Payer: Self-pay | Admitting: Pulmonary Disease

## 2015-10-10 ENCOUNTER — Telehealth: Payer: Self-pay | Admitting: Licensed Clinical Social Worker

## 2015-10-10 NOTE — Telephone Encounter (Signed)
Pt called and spoke with May, she does not want to come for earlier appointment.  Appointment for 12/22 kept.

## 2015-10-10 NOTE — Telephone Encounter (Signed)
Left msg for pt to call back to schedule an earlier appointment.  Appointments available this week.  Letter can be taken care of at that time.

## 2015-10-10 NOTE — Telephone Encounter (Signed)
CSW received incoming call from [name].  Pt voiced two request; requesting letter regarding housing and requesting new referral to "liver" doctor.  [name] states she was previously provided with a prescription for a nasal spray for allergies.  Pt states her apartment is making her sick because of mold and complains that her nose is running all the time.  [name] requesting letter from physician stating her allergies are from the mold in her apartment.  Pt states she has not seen an allergist but is aware that mold causes allergies.  Pt informed CSW will forward this note to physician with request.  However, physician may want to see in-person to discuss.  Pt aware she has an appointment on 10/19/15, requesting if there is anything earlier.  CSW informed [name] will forward to triage to assess.  CSW provided [name] with the number to [employer].  Discussed purpose of [employer] as a resource for landlord/tenant issues, especially with mold concerns.   Following the discussion of the housing letter, [name] states she sees a physician in [facility], Dr. Alda [name], her "liver" doctor.  Pt complains that this doctor is rude to her.  [name] has been seeing this provider for some time; however, the office has recently moved from [facility] to [employer].  Pt has difficulty finding her way around [facility] and does not want to go there for appointments.  CSW inquired if pt has spoken to the office and if her current doctor can refer to anyone in the [place] area.  Pt does not have his number and would have to call [facility].  CSW informed pt would forward request for new referral to PCP.

## 2015-10-13 ENCOUNTER — Other Ambulatory Visit: Payer: Self-pay | Admitting: Pulmonary Disease

## 2015-10-13 ENCOUNTER — Telehealth: Payer: Self-pay | Admitting: Pulmonary Disease

## 2015-10-13 NOTE — Telephone Encounter (Signed)
Pt requesting tramadol to be filled. °

## 2015-10-16 MED ORDER — TRAMADOL HCL 50 MG PO TABS: 50.0000 mg | ORAL_TABLET | Freq: Four times a day (QID) | ORAL | Status: DC | PRN

## 2015-10-16 NOTE — Telephone Encounter (Signed)
  INTERNAL MEDICINE RESIDENCY PROGRAM After-Hours Telephone Call    Reason for call:   I received a call from [name] at 6:05 PM, 10/16/2015 indicating severe foot pain for which she is on a pain contract for with Tramadol 50 mg Q6H prn #120 each month. Patient last had it filled on 11/23 and states she ran out early due to increased pain. She cannot walk on her foot and has to use crutches. She has an appointment with her PCP on Thursday, 12/22. She does not usually ask for early refills per patient, but cannot stand the pain. She denies fever, chills, nausea, vomiting, or trauma to her foot.    Pertinent Data:   No red flags in chart concerning early refills, appears refill was pended for PCP    Assessment / Plan / Recommendations:   Called Tramadol 50 mg Q6H prn #120, 0 refills into [name] [name]. Reminded patient she cannot usually get early refills and should take her medication as prescribed. She will follow up with her PCP on 12/22.   As always, pt is advised that if symptoms worsen or new symptoms arise, they should go to an urgent care facility or to to ER for further evaluation.    Osa Craver, DO PGY-2 Internal Medicine Resident Pager # [phone] 10/16/2015 6:06 PM

## 2015-10-16 NOTE — Telephone Encounter (Signed)
Pt also need xifaxan. Request sent to PCP

## 2015-10-17 ENCOUNTER — Other Ambulatory Visit (HOSPITAL_COMMUNITY): Payer: Self-pay | Admitting: Psychiatry

## 2015-10-17 MED ORDER — RIFAXIMIN 550 MG PO TABS: 550.0000 mg | ORAL_TABLET | Freq: Two times a day (BID) | ORAL | Status: DC

## 2015-10-17 NOTE — Addendum Note (Signed)
Addended by: Jacques Earthly T on: 10/17/2015 11:06 AM   Modules accepted: Orders

## 2015-10-17 NOTE — Telephone Encounter (Signed)
Tramadol refill noted. Also put in order for Xifaxan refill.  Jacques Earthly, MD  Internal Medicine Teaching Service PGY-2

## 2015-10-19 ENCOUNTER — Ambulatory Visit (INDEPENDENT_AMBULATORY_CARE_PROVIDER_SITE_OTHER): Payer: Medicaid Other | Admitting: Pulmonary Disease

## 2015-10-19 ENCOUNTER — Encounter: Payer: Self-pay | Admitting: Pulmonary Disease

## 2015-10-19 VITALS — BP 150/80 | HR 90 | Temp 97.7°F | Ht 63.0 in | Wt 202.3 lb

## 2015-10-19 DIAGNOSIS — Z79899 Other long term (current) drug therapy: Secondary | ICD-10-CM

## 2015-10-19 DIAGNOSIS — M25571 Pain in right ankle and joints of right foot: Secondary | ICD-10-CM

## 2015-10-19 DIAGNOSIS — J309 Allergic rhinitis, unspecified: Secondary | ICD-10-CM

## 2015-10-19 DIAGNOSIS — G8929 Other chronic pain: Secondary | ICD-10-CM

## 2015-10-19 DIAGNOSIS — I10 Essential (primary) hypertension: Secondary | ICD-10-CM

## 2015-10-19 DIAGNOSIS — K7460 Unspecified cirrhosis of liver: Secondary | ICD-10-CM | POA: Diagnosis not present

## 2015-10-19 DIAGNOSIS — B182 Chronic viral hepatitis C: Secondary | ICD-10-CM

## 2015-10-19 MED ORDER — SPIRONOLACTONE 50 MG PO TABS: 50.0000 mg | ORAL_TABLET | Freq: Every day | ORAL | Status: DC

## 2015-10-19 MED ORDER — PANTOPRAZOLE SODIUM 40 MG PO TBEC: 40.0000 mg | DELAYED_RELEASE_TABLET | Freq: Every day | ORAL | Status: DC

## 2015-10-19 NOTE — Patient Instructions (Signed)
Please change your spironolactone from 25mg  daily to the new prescription for 50mg  daily.

## 2015-10-19 NOTE — Progress Notes (Signed)
Subjective:    [name], female    DOB: [birth year], 54 y.o.   MRN: [id number]  HPI [name] is a 54 year old woman with history of hypertension, hepatitis C, alcohol cirrhosis, COPD, GERD, depression/anxiety presenting for evaluation of her allergic rhinitis.  She has chronic allergic rhinitis. She has noticed worsening of her symptoms starting around the time she noted mold/mildew in her apartment. She reports post nasal drip, rhinorrhea, cough with small amount of blood in mucous for the past 2-3 months. She is congested as well. Denies fevers/chills.  Review of Systems Constitutional: no fevers/chills Eyes: no vision changes Ears, nose, mouth, throat, and face: +cough Respiratory: no shortness of breath Cardiovascular: no chest pain Gastrointestinal: no nausea/vomiting, no abdominal pain, no constipation, no diarrhea Genitourinary: no dysuria, no hematuria Integument: no rash Hematologic/lymphatic: no bleeding/bruising, no edema Musculoskeletal: no arthralgias, no myalgias Neurological: no paresthesias, no weakness  Past Medical History  Diagnosis Date  . Alcoholic cirrhosis ([place])   . COPD (chronic obstructive pulmonary disease) ([place])   . Heart palpitations   . Anxiety   . Portal hypertension ([place])   . Esophageal varices ([place])   . Cholelithiasis   . Splenomegaly   . Fatty liver   . Esophagitis 2010  . Tubulovillous adenoma of colon   . Visual hallucination     since 09/2010/notes 10/30/2012  . Hepatitis C     chronic hepatitis C and Steatohepatitis (hep grade 2, stage 2-3) per 05/13/08 liver biopsy  . Obesity   . cervical Cancer 1999  . Depression   . Shortness of breath   . Allergy   . Arthritis   . GERD (gastroesophageal reflux disease)   . Hypertension   . Gastritis   . Decompensated hepatic cirrhosis ([place]) 03/07/2015    Side effect of Vikera  . Adverse drug effect - [facility] 03/07/2015    Current Outpatient Prescriptions on File  Prior to Visit  Medication Sig Dispense Refill  . azithromycin (ZITHROMAX) 250 MG tablet Take 1 tablet (250 mg total) by mouth daily. 3 each 0  . busPIRone (BUSPAR) 15 MG tablet 1 bid for  1 week then 1 qam  2  qhs 90 tablet 5  . COMBIVENT RESPIMAT 20-100 MCG/ACT AERS respimat INHALE 1 PUFF INTO THE LUNGS EVERY 6 (SIX) HOURS AS NEEDED FOR WHEEZING. 4 g 3  . COMBIVENT RESPIMAT 20-100 MCG/ACT AERS respimat INHALE 1 PUFF INTO THE LUNGS EVERY 6 (SIX) HOURS AS NEEDED FOR WHEEZING. 4 g 6  . dextromethorphan-guaiFENesin (MUCINEX DM) 30-600 MG 12hr tablet Take 1 tablet by mouth 2 (two) times daily. 15 tablet 0  . doxepin (SINEQUAN) 10 MG capsule 1  qhs 30 capsule 3  . fluticasone (FLONASE) 50 MCG/ACT nasal spray PLACE 1 SPRAY INTO BOTH NOSTRILS DAILY. 16 g 5  . furosemide (LASIX) 20 MG tablet TAKE 1 TABLET (20 MG TOTAL) BY MOUTH TWO   TIMES DAILY. 60 tablet 5  . hydrOXYzine (ATARAX/VISTARIL) 50 MG tablet 1 qhs may repeat 60 tablet 3  . lactulose (CHRONULAC) 10 GM/15ML solution Take 39 ml (20 grams) by mouth daily 1350 mL 1  . LORazepam (ATIVAN) 1 MG tablet 1 qhs 30 tablet 3  . Melatonin 1 MG CAPS TAKE 1 CAPSULE BY MOUTH AT BEDTIME. 30 capsule 5  . pantoprazole (PROTONIX) 40 MG tablet TAKE 1 TABLET (40 MG TOTAL) BY MOUTH DAILY. 90 tablet 1  . PROAIR HFA 108 (90 BASE) MCG/ACT inhaler TAKE   TWO  PUFFS BY MOUTH   EVERY SIX HOURS   AS NEEDED   FOR WHEEZING 8.5 g 3  . promethazine (PHENERGAN) 12.5 MG tablet Take 1 tablet (12.5 mg total) by mouth every 8 (eight) hours as needed for nausea or vomiting. 30 tablet 0  . propranolol (INDERAL) 20 MG tablet TAKE 1 TABLET (20 MG TOTAL) BY MOUTH DAILY. 90 tablet 3  . rifaximin (XIFAXAN) 550 MG TABS tablet Take 1 tablet (550 mg total) by mouth 2 (two) times daily. 60 tablet 3  . SPIRIVA HANDIHALER 18 MCG inhalation capsule PLACE 1 CAPSULE (18 MCG TOTAL) INTO INHALER AND INHALE DAILY. 30 capsule 3  . SPIRIVA HANDIHALER 18 MCG inhalation capsule PLACE 1 CAPSULE (18 MCG  TOTAL) INTO INHALER AND INHALE DAILY. 30 capsule 6  . spironolactone (ALDACTONE) 25 MG tablet TAKE 1 TABLET (25 MG TOTAL) BY MOUTH DAILY. 30 tablet 5  . traMADol (ULTRAM) 50 MG tablet Take 1 tablet (50 mg total) by mouth every 6 (six) hours as needed. 120 tablet 0  . traZODone (DESYREL) 50 MG tablet Take 1 tablet (50 mg total) by mouth at bedtime. 30 tablet 0  . Vitamin D, Ergocalciferol, (DRISDOL) 50000 UNITS CAPS capsule Take 1 capsule (50,000 Units total) by mouth every 7 (seven) days. 8 capsule 0   No current facility-administered medications on file prior to visit.    Today's Vitals   10/19/15 1557 10/19/15 1637  BP: 150/84 150/80  Pulse: 93 90  Temp: 97.7 F (36.5 C)   TempSrc: Oral   Height: 5\' 3"  (1.6 m)   Weight: 202 lb 4.8 oz (91.763 kg)   SpO2: 93%   PainSc: 0-No pain    Objective:   Physical Exam  Constitutional: She is oriented to person, place, and time. No distress.  HENT:  Head: Normocephalic and atraumatic.  Mouth/Throat: Oropharynx is clear and moist. No oropharyngeal exudate.  Eyes: Conjunctivae are normal.  Neck: Neck supple.  Cardiovascular: Normal rate and regular rhythm.   Pulmonary/Chest: Effort normal and breath sounds normal.  Abdominal: Soft. She exhibits no distension.  Neurological: She is alert and oriented to person, place, and time.  Skin: Skin is warm and dry.  Psychiatric: She has a normal mood and affect.   Assessment & Plan:  Please refer to problem based charting.

## 2015-10-20 DIAGNOSIS — J309 Allergic rhinitis, unspecified: Secondary | ICD-10-CM | POA: Insufficient documentation

## 2015-10-20 NOTE — Assessment & Plan Note (Signed)
Assessment: She has been running out of tramadol early for about the past 2-3 months. She was seen previously by Dr. Rhina Brackett (Sports Medicine) but was lost to follow up.  Plan: -Will refer back to Dr. Rip Harbour.

## 2015-10-20 NOTE — Assessment & Plan Note (Signed)
Assessment: Worsening of allergic rhinitis may be related to her environment.  Plan:  -Continue fluticasone 87mcg nasal spray daily -Continue OTC antihistamine -Provided letter for patient to present to [employer]

## 2015-10-20 NOTE — Assessment & Plan Note (Signed)
BP Readings from Last 3 Encounters:  10/19/15 150/80  08/21/15 107/74  08/16/15 131/81    Lab Results  Component Value Date   NA 142 05/11/2015   K 3.6 05/11/2015   CREATININE 0.59 05/11/2015    Assessment: Blood pressure control: Uncontrolled Progress toward BP goal:  Deteriorated  Plan: Medications:  Increase spironolactone from 25mg  daily to 50mg  daily. Continue Lasix 20mg  BID, and propranolol 20mg  daily -Check BMP at next follow up. -Follow up in 4-6 weeks.

## 2015-10-20 NOTE — Assessment & Plan Note (Signed)
Assessment: She reports compliance of her lactulose and rifaximin. She has 2-3 BM per day. This is the most alert I have seen her.  Plans: -Referral placed for new GI doctor per her request. Her current doctor moved his office to [facility] and she has trouble getting there. Would like to be seen in [place].

## 2015-10-23 ENCOUNTER — Other Ambulatory Visit (HOSPITAL_COMMUNITY): Payer: Self-pay | Admitting: Psychiatry

## 2015-10-23 DIAGNOSIS — F331 Major depressive disorder, recurrent, moderate: Secondary | ICD-10-CM

## 2015-10-23 NOTE — Progress Notes (Signed)
Case discussed with Dr. Krall at the time of the visit. We reviewed the resident's history and exam and pertinent patient test results. I agree with the assessment, diagnosis, and plan of care documented in the resident's note. 

## 2015-11-01 ENCOUNTER — Other Ambulatory Visit (HOSPITAL_COMMUNITY): Payer: Self-pay | Admitting: Psychiatry

## 2015-11-09 ENCOUNTER — Other Ambulatory Visit: Payer: Self-pay

## 2015-11-09 NOTE — Telephone Encounter (Signed)
Not due til 1/19

## 2015-11-13 ENCOUNTER — Telehealth: Payer: Self-pay | Admitting: Internal Medicine

## 2015-11-13 DIAGNOSIS — M25571 Pain in right ankle and joints of right foot: Principal | ICD-10-CM

## 2015-11-13 DIAGNOSIS — G8929 Other chronic pain: Secondary | ICD-10-CM

## 2015-11-13 MED ORDER — TRAMADOL HCL 50 MG PO TABS: 50.0000 mg | ORAL_TABLET | Freq: Four times a day (QID) | ORAL | Status: DC | PRN

## 2015-11-13 NOTE — Telephone Encounter (Signed)
   Reason for call:   I received a call from Ms. [name] at 10:45 AM indicating her Tramadol prescription was not called in and she is having severe right ankle/foot pain.   Pertinent Data:   Reports she was told it would be called in Friday 11/10/15  States her last refill was on 10/13/15 for Tramadol 50 mg Q6H PRN #120 tablets  She tried to call clinic but then realized it was a holiday  Reports she is unable to handle the pain and has run out of Tramadol   Assessment / Plan / Recommendations:   Instructed patient to call the [facility] tomorrow to schedule an appointment since she does not have one scheduled  Per her records, I see that she was last prescribed Tramadol 50 mg Q6H PRN #120 tablets on 10/13/15  Last UDS in May 2016 positive for benzos (on Ativan) and amphetamines   Instructed patient that I will give her a 2 week supply for Tramadol (#60 tablets) but that she needs to get her refills from her PCP from now on and it is her responsibility to get refills on time. This prescription was called in to [facility].  As always, pt is advised that if symptoms worsen or new symptoms arise, they should go to an urgent care facility or to to ER for further evaluation.   Juliet Rude, MD   11/13/2015, 10:57 AM

## 2015-11-15 ENCOUNTER — Other Ambulatory Visit: Payer: Self-pay | Admitting: *Deleted

## 2015-11-15 MED ORDER — TRAMADOL HCL 50 MG PO TABS: 50.0000 mg | ORAL_TABLET | Freq: Four times a day (QID) | ORAL | Status: DC | PRN

## 2015-11-15 NOTE — Telephone Encounter (Signed)
Dr rivet called #[phone] in on 11/13/2015

## 2015-11-15 NOTE — Telephone Encounter (Signed)
Called to pharm 

## 2015-11-22 ENCOUNTER — Ambulatory Visit (INDEPENDENT_AMBULATORY_CARE_PROVIDER_SITE_OTHER): Payer: Medicaid Other | Admitting: Psychiatry

## 2015-11-22 ENCOUNTER — Other Ambulatory Visit (HOSPITAL_COMMUNITY): Payer: Self-pay | Admitting: Psychiatry

## 2015-11-22 VITALS — BP 129/86 | HR 61 | Ht 64.0 in | Wt 206.0 lb

## 2015-11-22 DIAGNOSIS — F4323 Adjustment disorder with mixed anxiety and depressed mood: Secondary | ICD-10-CM

## 2015-11-22 DIAGNOSIS — F331 Major depressive disorder, recurrent, moderate: Secondary | ICD-10-CM

## 2015-11-22 MED ORDER — TRAZODONE HCL 50 MG PO TABS
50.0000 mg | ORAL_TABLET | Freq: Every day | ORAL | Status: DC
Start: 2015-11-22 — End: 2016-03-21

## 2015-11-22 MED ORDER — BUSPIRONE HCL 15 MG PO TABS: ORAL_TABLET | ORAL | Status: DC

## 2015-11-22 MED ORDER — DOXEPIN HCL 10 MG PO CAPS: ORAL_CAPSULE | ORAL | Status: DC

## 2015-11-22 MED ORDER — LORAZEPAM 1 MG PO TABS: ORAL_TABLET | ORAL | Status: DC
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
## 2015-11-23 ENCOUNTER — Ambulatory Visit (INDEPENDENT_AMBULATORY_CARE_PROVIDER_SITE_OTHER): Payer: Medicaid Other | Admitting: Pulmonary Disease

## 2015-11-23 ENCOUNTER — Encounter: Payer: Self-pay | Admitting: Pulmonary Disease

## 2015-11-23 VITALS — BP 118/47 | HR 61 | Temp 98.2°F | Ht 63.0 in | Wt 206.3 lb

## 2015-11-23 DIAGNOSIS — J449 Chronic obstructive pulmonary disease, unspecified: Secondary | ICD-10-CM | POA: Diagnosis not present

## 2015-11-23 DIAGNOSIS — E559 Vitamin D deficiency, unspecified: Secondary | ICD-10-CM

## 2015-11-23 DIAGNOSIS — Z7951 Long term (current) use of inhaled steroids: Secondary | ICD-10-CM

## 2015-11-23 DIAGNOSIS — G8929 Other chronic pain: Secondary | ICD-10-CM

## 2015-11-23 DIAGNOSIS — I10 Essential (primary) hypertension: Secondary | ICD-10-CM | POA: Diagnosis not present

## 2015-11-23 MED ORDER — BUDESONIDE-FORMOTEROL FUMARATE 80-4.5 MCG/ACT IN AERO: 2.0000 | INHALATION_SPRAY | Freq: Two times a day (BID) | RESPIRATORY_TRACT | Status: DC

## 2015-11-23 NOTE — Progress Notes (Signed)
Subjective:    [name], female    DOB: [birth year], 55 y.o.   MRN: [id number]  HPI Ms. Nikiyah Gonsales is a 55 year old woman with history of hypertension, hepatitis C, alcohol cirrhosis, COPD, GERD, depression/anxiety presenting for follow up of her COPD.  She reports she is doing well. She has been seen by sports medicine for her right foot, and she reports that he is going to refer her for possible surgery. She takes 4 tablets of tramadol in a day and last took a tablet this morning.  She has 3 bowel movements per day.  Her COPD is not well controlled. She takes albuterol/ipatropium about twice a day and albuterol 1-2 times a day. She reports compliance of her Spiriva daily. She has a lot of cough and mucus. She is dyspneic with exertion. She has disturbed sleep because of her COPD.   Review of Systems Constitutional: no fevers/chills Respiratory: no shortness of breath Gastrointestinal: no nausea/vomiting, no abdominal pain, no constipation, no diarrhea Genitourinary: no dysuria, no hematuria  Past Medical History  Diagnosis Date  . Alcoholic cirrhosis ([place])   . COPD (chronic obstructive pulmonary disease) ([place])   . Heart palpitations   . Anxiety   . Portal hypertension ([place])   . Esophageal varices ([place])   . Cholelithiasis   . Splenomegaly   . Fatty liver   . Esophagitis 2010  . Tubulovillous adenoma of colon   . Visual hallucination     since 09/2010/notes 10/30/2012  . Hepatitis C     chronic hepatitis C and Steatohepatitis (hep grade 2, stage 2-3) per 05/13/08 liver biopsy  . Obesity   . cervical Cancer 1999  . Depression   . Shortness of breath   . Allergy   . Arthritis   . GERD (gastroesophageal reflux disease)   . Hypertension   . Gastritis   . Decompensated hepatic cirrhosis ([place]) 03/07/2015    Side effect of Vikera  . Adverse drug effect - [facility] 03/07/2015    Current Outpatient Prescriptions on File Prior to Visit  Medication Sig Dispense  Refill  . azithromycin (ZITHROMAX) 250 MG tablet Take 1 tablet (250 mg total) by mouth daily. 3 each 0  . busPIRone (BUSPAR) 15 MG tablet 1 bid for  1 week then 1 qam  2  qhs 90 tablet 5  . COMBIVENT RESPIMAT 20-100 MCG/ACT AERS respimat INHALE 1 PUFF INTO THE LUNGS EVERY 6 (SIX) HOURS AS NEEDED FOR WHEEZING. 4 g 3  . COMBIVENT RESPIMAT 20-100 MCG/ACT AERS respimat INHALE 1 PUFF INTO THE LUNGS EVERY 6 (SIX) HOURS AS NEEDED FOR WHEEZING. 4 g 6  . dextromethorphan-guaiFENesin (MUCINEX DM) 30-600 MG 12hr tablet Take 1 tablet by mouth 2 (two) times daily. 15 tablet 0  . doxepin (SINEQUAN) 10 MG capsule 1  qhs 30 capsule 6  . fluticasone (FLONASE) 50 MCG/ACT nasal spray PLACE 1 SPRAY INTO BOTH NOSTRILS DAILY. 16 g 5  . furosemide (LASIX) 20 MG tablet TAKE 1 TABLET (20 MG TOTAL) BY MOUTH TWO   TIMES DAILY. 60 tablet 5  . hydrOXYzine (ATARAX/VISTARIL) 50 MG tablet 1 qhs may repeat 60 tablet 3  . lactulose (CHRONULAC) 10 GM/15ML solution Take 39 ml (20 grams) by mouth daily 1350 mL 1  . LORazepam (ATIVAN) 1 MG tablet 1 qhs 30 tablet 5  . Melatonin 1 MG CAPS TAKE 1 CAPSULE BY MOUTH AT BEDTIME. 30 capsule 5  . pantoprazole (PROTONIX) 40 MG tablet Take 1 tablet (40 mg  total) by mouth daily. 90 tablet 1  . PROAIR HFA 108 (90 BASE) MCG/ACT inhaler TAKE   TWO   PUFFS BY MOUTH   EVERY SIX HOURS   AS NEEDED   FOR WHEEZING 8.5 g 3  . promethazine (PHENERGAN) 12.5 MG tablet Take 1 tablet (12.5 mg total) by mouth every 8 (eight) hours as needed for nausea or vomiting. 30 tablet 0  . propranolol (INDERAL) 20 MG tablet TAKE 1 TABLET (20 MG TOTAL) BY MOUTH DAILY. 90 tablet 3  . rifaximin (XIFAXAN) 550 MG TABS tablet Take 1 tablet (550 mg total) by mouth 2 (two) times daily. 60 tablet 3  . SPIRIVA HANDIHALER 18 MCG inhalation capsule PLACE 1 CAPSULE (18 MCG TOTAL) INTO INHALER AND INHALE DAILY. 30 capsule 3  . spironolactone (ALDACTONE) 50 MG tablet Take 1 tablet (50 mg total) by mouth daily. 30 tablet 1  . traMADol  (ULTRAM) 50 MG tablet Take 1 tablet (50 mg total) by mouth every 6 (six) hours as needed. 60 tablet 0  . traZODone (DESYREL) 50 MG tablet Take 1 tablet (50 mg total) by mouth at bedtime. 30 tablet 6  . Vitamin D, Ergocalciferol, (DRISDOL) 50000 UNITS CAPS capsule Take 1 capsule (50,000 Units total) by mouth every 7 (seven) days. 8 capsule 0   No current facility-administered medications on file prior to visit.    Today's Vitals   11/23/15 1025 11/23/15 1026  BP: 118/47   Pulse: 61   Temp: 98.2 F (36.8 C)   TempSrc: Oral   Height: 5\' 3"  (1.6 m)   Weight: 206 lb 4.8 oz (93.577 kg)   SpO2: 92%   PainSc: 6  6   PainLoc: Ankle    Objective:   Physical Exam  Constitutional: She appears well-developed. No distress.  Cardiovascular: Normal rate, regular rhythm and normal heart sounds.   Pulmonary/Chest: Effort normal and breath sounds normal. She has no wheezes. She has no rales.  Abdominal: Soft. She exhibits no distension. There is no tenderness.   Assessment & Plan:  Please refer to problem based charting.

## 2015-11-23 NOTE — Patient Instructions (Addendum)
You may call on December 13, 2015 for your next prescription of tramadol.  For your COPD: -Take the Spiriva/tiotropium (ROUND INHALER) once a day every day. -Take the new inhaler Symbicort/budesonide-formoterol (INHALER with GREEN LABEL) twice a day every day. -Take Proair/albuterol (RED AND WHITE INHALER) only as needed.  STOP taking Combivent (ORANGE INHALER).

## 2015-11-24 LAB — BMP8+ANION GAP
Anion Gap: 15 mmol/L (ref 10.0–18.0)
BUN / CREAT RATIO: 17 (ref 9–23)
BUN: 11 mg/dL (ref 6–24)
CO2: 21 mmol/L (ref 18–29)
CREATININE: 0.66 mg/dL (ref 0.57–1.00)
Calcium: 8.9 mg/dL (ref 8.7–10.2)
Chloride: 103 mmol/L (ref 96–106)
GFR calc Af Amer: 114 mL/min/{1.73_m2} (ref >59)
GFR, EST NON AFRICAN AMERICAN: 99 mL/min/{1.73_m2} (ref >59)
Glucose: 77 mg/dL (ref 65–99)
Potassium: 4.5 mmol/L (ref 3.5–5.2)
SODIUM: 139 mmol/L (ref 134–144)

## 2015-11-24 LAB — VITAMIN D 25 HYDROXY (VIT D DEFICIENCY, FRACTURES): Vit D, 25-Hydroxy: 20.5 ng/mL — ABNORMAL LOW (ref 30.0–100.0)

## 2015-11-24 NOTE — Assessment & Plan Note (Signed)
Assessment: No red flags on narcotic database. She has been asking for her refills a week early for the past two months. Discussed with her when she will be due for next refill (she may call on February 15). She is seeing sports medicine for her ankle.  Plan: -UDS -Next refill may be requested February 15.

## 2015-11-24 NOTE — Assessment & Plan Note (Signed)
Assessment: Vitamin D level 20.5  Plan: Will discuss at follow up starting 600 to 800 units of vitamin D3 daily for maintenance.

## 2015-11-24 NOTE — Assessment & Plan Note (Signed)
BP Readings from Last 3 Encounters:  11/23/15 118/47  11/22/15 129/86  10/19/15 150/80    Lab Results  Component Value Date   NA 139 11/23/2015   K 4.5 11/23/2015   CREATININE 0.66 11/23/2015    Assessment: Blood pressure control: controlled Progress toward BP goal:  at goal  Plan: Medications:  Continue Lasix 20mg  BID, spironolactone 50mg  daily, propranolol 20mg  daily

## 2015-11-24 NOTE — Assessment & Plan Note (Addendum)
Assessment: FEV1 61% 07/2014. COPD B (CAT score 28). She is not controlled on Spiriva alone.  Plan: -Continue Spiriva 38mcg daily -Add Symbicort 80-4.5 mcg/act 2 puff BID -Discontinue albuterol/ipatropium and continue albuterol 2 puffs q 6 hr prn wheezing

## 2015-11-27 NOTE — Progress Notes (Signed)
Internal Medicine Clinic Attending  Case discussed with Dr. Wilson soon after the resident saw the patient.  We reviewed the resident's history and exam and pertinent patient test results.  I agree with the assessment, diagnosis, and plan of care documented in the resident's note.  

## 2015-11-29 ENCOUNTER — Telehealth: Payer: Self-pay | Admitting: Pulmonary Disease

## 2015-11-29 ENCOUNTER — Other Ambulatory Visit: Payer: Self-pay | Admitting: *Deleted

## 2015-11-29 DIAGNOSIS — M25571 Pain in right ankle and joints of right foot: Principal | ICD-10-CM

## 2015-11-29 DIAGNOSIS — G8929 Other chronic pain: Secondary | ICD-10-CM

## 2015-11-29 LAB — TOXASSURE SELECT,+ANTIDEPR,UR: PDF: 0

## 2015-11-29 NOTE — Telephone Encounter (Signed)
LVM for patient

## 2015-11-29 NOTE — Telephone Encounter (Signed)
Have spoken to pt

## 2015-11-29 NOTE — Telephone Encounter (Signed)
Patient has questions about all her meds.

## 2015-11-29 NOTE — Telephone Encounter (Signed)
Patient called stating she has been ref to Dr. Zackery Barefoot office at [place].  Called [employer] and verified info and notes have been placed in your box and ref needed per Medicaid.

## 2015-11-29 NOTE — Telephone Encounter (Signed)
i spoke w/ pt and pharm this am, pharm will fill last script of #60 today, will need to give them a new one for 120 by 2/15. Then she will be back to her 120 monthly. Also spoke to pt about ortho appt, urologist and her pain. She wanted #120 today but explained that she was only due #60 having filled #60 15 days ago

## 2015-12-04 ENCOUNTER — Other Ambulatory Visit: Payer: Self-pay | Admitting: Pulmonary Disease

## 2015-12-04 DIAGNOSIS — Z1231 Encounter for screening mammogram for malignant neoplasm of breast: Secondary | ICD-10-CM

## 2015-12-04 MED ORDER — TRAMADOL HCL 50 MG PO TABS: 50.0000 mg | ORAL_TABLET | Freq: Four times a day (QID) | ORAL | Status: DC | PRN

## 2015-12-05 NOTE — Telephone Encounter (Signed)
Called to pharm 

## 2015-12-07 ENCOUNTER — Other Ambulatory Visit: Payer: Self-pay | Admitting: Gastroenterology

## 2015-12-07 DIAGNOSIS — B182 Chronic viral hepatitis C: Secondary | ICD-10-CM

## 2015-12-07 DIAGNOSIS — K7030 Alcoholic cirrhosis of liver without ascites: Secondary | ICD-10-CM

## 2015-12-14 ENCOUNTER — Telehealth: Payer: Self-pay

## 2015-12-14 NOTE — Telephone Encounter (Signed)
Patient says the medicine for her cold was not called into the drugstore.

## 2015-12-14 NOTE — Telephone Encounter (Signed)
Called pharmacy, they had tried to send a fax about PA on symbicort but fax did not go through, they are refaxing

## 2015-12-15 ENCOUNTER — Telehealth: Payer: Self-pay | Admitting: *Deleted

## 2015-12-15 ENCOUNTER — Ambulatory Visit
Admission: RE | Admit: 2015-12-15 | Discharge: 2015-12-15 | Disposition: A | Discharge status: Home / Self Care | Payer: Medicaid Other | Source: Ambulatory Visit | Attending: Oncology | Admitting: Oncology

## 2015-12-15 DIAGNOSIS — Z1231 Encounter for screening mammogram for malignant neoplasm of breast: Secondary | ICD-10-CM

## 2015-12-15 NOTE — Telephone Encounter (Signed)
CALLED PATIENT, SHE IS TO CALL BACK WITH PHONE NUMBER OF THE ORTHO DR. Marlana Salvage IS GOING TO SEE, SO I CAN SEND RECORDS.

## 2015-12-18 ENCOUNTER — Other Ambulatory Visit: Payer: Self-pay

## 2015-12-18 ENCOUNTER — Other Ambulatory Visit: Payer: Self-pay | Admitting: Pulmonary Disease

## 2015-12-18 NOTE — Telephone Encounter (Signed)
Spoke with patient who reports she is no better (feels worse) than she did when she was seen 1/26.  Her insurance will not cover Symbicort inhaler without approval.  Wheezing is audible over the phone.  Pt reports wheezing, increased shortness of breath, increased productive cough (yellow/green sputum).  Pt scheduled to be seen 2/22 at 215 in absence of any sooner clinic appointments.  I am currently working on prior auth with [employer]     PA approved 1 year , [phone]

## 2015-12-18 NOTE — Telephone Encounter (Signed)
NEEDS DIFFERENT INHALER, ONE SHE IS USING IS NOT WORKING. PLEASE CALL PATIENT.

## 2015-12-20 ENCOUNTER — Ambulatory Visit: Payer: Self-pay | Admitting: Internal Medicine

## 2015-12-23 ENCOUNTER — Telehealth: Payer: Self-pay | Admitting: Internal Medicine

## 2015-12-23 MED ORDER — GUAIFENESIN-DM 100-10 MG/5ML PO SYRP: 5.0000 mL | ORAL_SOLUTION | ORAL | Status: DC | PRN

## 2015-12-23 NOTE — Telephone Encounter (Signed)
   Reason for call:   I received a call from [name] at 1036  AM indicating not feeling well.   Pertinent Data:   She reports productive cough for the last 3-4 weeks and difficulty breathing that she feels got "really bad" this morning. Also associated with subjective fever and wheezing.  She is working on getting a new inhaler but has been taking Symbicort 2 puffs daily, Spiriva daily, albuterol "whenever she feels bad" [unable to quantify]. Not on home O2.  She is requesting help for the congestion.   Assessment / Plan / Recommendations:   Review of her most recent office visit 11/23/15 with PCP, it appears her COPD has not been well controlled for some time. The acute worsening of her symptoms may be suggestive of acute viral illness though difficult to assess over the phone, especially given the patient's psychiatric comorbidities.  She agrees to a trial of cough medicine, so I will prescribe guaifenesin/dextromethorphan for her to pick up with plan to follow-up next week on Thursday, March 2nd.  As always, pt is advised that if symptoms worsen or new symptoms arise, they should go to an urgent care facility or to to ER for further evaluation.   Riccardo Dubin, MD   12/23/2015, 10:39 AM

## 2015-12-25 ENCOUNTER — Other Ambulatory Visit: Payer: Self-pay | Admitting: Internal Medicine

## 2015-12-25 ENCOUNTER — Other Ambulatory Visit: Payer: Self-pay | Admitting: Pulmonary Disease

## 2015-12-26 ENCOUNTER — Other Ambulatory Visit: Payer: Self-pay | Admitting: Pulmonary Disease

## 2015-12-26 ENCOUNTER — Ambulatory Visit: Payer: Self-pay | Admitting: Gastroenterology

## 2015-12-28 ENCOUNTER — Encounter: Payer: Self-pay | Admitting: Pulmonary Disease

## 2016-01-01 ENCOUNTER — Encounter: Payer: Self-pay | Admitting: Pulmonary Disease

## 2016-01-05 ENCOUNTER — Ambulatory Visit
Admission: RE | Admit: 2016-01-05 | Discharge: 2016-01-05 | Disposition: A | Discharge status: Home / Self Care | Payer: Medicaid Other | Source: Ambulatory Visit | Attending: Gastroenterology | Admitting: Gastroenterology

## 2016-01-05 DIAGNOSIS — B182 Chronic viral hepatitis C: Secondary | ICD-10-CM

## 2016-01-05 DIAGNOSIS — K7030 Alcoholic cirrhosis of liver without ascites: Secondary | ICD-10-CM

## 2016-01-05 NOTE — Telephone Encounter (Signed)
Called to pharm 

## 2016-01-08 ENCOUNTER — Other Ambulatory Visit: Payer: Self-pay | Admitting: Pulmonary Disease

## 2016-01-08 NOTE — Telephone Encounter (Signed)
Pt requesting tramadol to be filled. °

## 2016-01-08 NOTE — Telephone Encounter (Signed)
Refill at pharmacy, patient informed

## 2016-01-18 ENCOUNTER — Encounter: Payer: Self-pay | Admitting: Pulmonary Disease

## 2016-01-26 ENCOUNTER — Other Ambulatory Visit: Payer: Self-pay | Admitting: Pulmonary Disease

## 2016-01-26 ENCOUNTER — Other Ambulatory Visit (HOSPITAL_COMMUNITY): Payer: Self-pay | Admitting: Psychiatry

## 2016-01-26 NOTE — Telephone Encounter (Signed)
Last seen 11/23/15. F/U appt 02/15/16.

## 2016-01-26 NOTE — Telephone Encounter (Signed)
Last appt 11/23/15.

## 2016-01-31 ENCOUNTER — Telehealth: Payer: Self-pay | Admitting: Pulmonary Disease

## 2016-01-31 NOTE — Telephone Encounter (Signed)
PATIENT HAS BEEN TO FOOT DOCTOR AND HAS QUESTIONS, PLEASE CALL

## 2016-02-02 ENCOUNTER — Other Ambulatory Visit: Payer: Self-pay

## 2016-02-02 NOTE — Telephone Encounter (Signed)
Pt requesting tramadol to be filled @ [facility].

## 2016-02-02 NOTE — Telephone Encounter (Signed)
Pt has been to see dr hewitt Pt states she needs surgery but dr says no surgery due to state of liver U/s from dr zachs office, has gallstones, needs surg referral Dr Doran Durand states she may be diabetic, she wants this looked into appt given

## 2016-02-02 NOTE — Telephone Encounter (Signed)
appt 4/17 at 0945 dr Randell Patient

## 2016-02-05 MED ORDER — TRAMADOL HCL 50 MG PO TABS: ORAL_TABLET | ORAL | Status: DC

## 2016-02-05 NOTE — Telephone Encounter (Signed)
Phoned in to [place]

## 2016-02-12 ENCOUNTER — Ambulatory Visit: Payer: Self-pay | Admitting: Pulmonary Disease

## 2016-02-15 ENCOUNTER — Ambulatory Visit (INDEPENDENT_AMBULATORY_CARE_PROVIDER_SITE_OTHER): Payer: Medicaid Other | Admitting: Pulmonary Disease

## 2016-02-15 ENCOUNTER — Encounter: Payer: Self-pay | Admitting: Pulmonary Disease

## 2016-02-15 ENCOUNTER — Other Ambulatory Visit: Payer: Self-pay | Admitting: Pulmonary Disease

## 2016-02-15 VITALS — BP 116/50 | HR 71 | Temp 98.4°F | Ht 63.0 in | Wt 214.1 lb

## 2016-02-15 DIAGNOSIS — G8929 Other chronic pain: Secondary | ICD-10-CM

## 2016-02-15 DIAGNOSIS — K7030 Alcoholic cirrhosis of liver without ascites: Secondary | ICD-10-CM

## 2016-02-15 DIAGNOSIS — Z131 Encounter for screening for diabetes mellitus: Secondary | ICD-10-CM | POA: Diagnosis not present

## 2016-02-15 DIAGNOSIS — J449 Chronic obstructive pulmonary disease, unspecified: Secondary | ICD-10-CM | POA: Diagnosis present

## 2016-02-15 DIAGNOSIS — Z7951 Long term (current) use of inhaled steroids: Secondary | ICD-10-CM | POA: Diagnosis not present

## 2016-02-15 DIAGNOSIS — M25571 Pain in right ankle and joints of right foot: Secondary | ICD-10-CM | POA: Diagnosis not present

## 2016-02-15 DIAGNOSIS — R7309 Other abnormal glucose: Secondary | ICD-10-CM

## 2016-02-15 DIAGNOSIS — K8020 Calculus of gallbladder without cholecystitis without obstruction: Secondary | ICD-10-CM

## 2016-02-15 DIAGNOSIS — Z0000 Encounter for general adult medical examination without abnormal findings: Secondary | ICD-10-CM

## 2016-02-15 LAB — GLUCOSE, CAPILLARY: GLUCOSE-CAPILLARY: 93 mg/dL (ref 65–99)

## 2016-02-15 LAB — POCT GLYCOSYLATED HEMOGLOBIN (HGB A1C): HEMOGLOBIN A1C: 5

## 2016-02-15 MED ORDER — BUDESONIDE-FORMOTEROL FUMARATE 160-4.5 MCG/ACT IN AERO: 2.0000 | INHALATION_SPRAY | Freq: Two times a day (BID) | RESPIRATORY_TRACT | Status: DC

## 2016-02-15 MED ORDER — DICLOFENAC SODIUM 1 % TD GEL: 4.0000 g | Freq: Four times a day (QID) | TRANSDERMAL | Status: DC

## 2016-02-15 NOTE — Patient Instructions (Addendum)
Please take 600 to 800 units of vitamin D3 daily   Use the Voltaren gel for your pain

## 2016-02-15 NOTE — Progress Notes (Signed)
Subjective:    [name], female    DOB: 31-Jan-1961, 55 y.o.   MRN: [id number]  HPI [name] is a 55 year old woman with history of hypertension, hepatitis C, alcohol cirrhosis, COPD, GERD, depression/anxiety presenting for follow up of her abdominal pain.  Abdominal pain "all the time." More on the left but some on the right. Gets pain after eating - about 30-45 minutes after. Also has indigestion. Protonix helps indigestion. Gets pain other times of the day. Stabbing pain. 9 or 10 at worst. Intermittent. Not worse at night. No jaundice or scleral icterus. She has dark urine. Occasional pruritus. No steatorrhea. Has had this pain for the past 7-8 months.  2-3 BM per day. Occasional streaks of blood. No melena.   Uses proair once last night and once this morning. Using her Symbicort daily   Review of Systems Constitutional: no fevers/chills Eyes: no vision changes Ears, nose, mouth, throat, and face: no cough Cardiovascular: no chest pain Genitourinary: no dysuria, no hematuria Integument: no rash Hematologic/lymphatic: no bleeding/bruising, no edema  Past Medical History  Diagnosis Date  . Alcoholic cirrhosis ([place])   . COPD (chronic obstructive pulmonary disease) ([place])   . Heart palpitations   . Anxiety   . Portal hypertension ([place])   . Esophageal varices ([place])   . Cholelithiasis   . Splenomegaly   . Fatty liver   . Esophagitis 2010  . Tubulovillous adenoma of colon   . Visual hallucination     since 09/2010/notes 10/30/2012  . Hepatitis C     chronic hepatitis C and Steatohepatitis (hep grade 2, stage 2-3) per 05/13/08 liver biopsy  . Obesity   . cervical Cancer 1999  . Depression   . Shortness of breath   . Allergy   . Arthritis   . GERD (gastroesophageal reflux disease)   . Hypertension   . Gastritis   . Decompensated hepatic cirrhosis ([place]) 03/07/2015    Side effect of Vikera  . Adverse drug effect - [facility] 03/07/2015    Current  Outpatient Prescriptions on File Prior to Visit  Medication Sig Dispense Refill  . busPIRone (BUSPAR) 15 MG tablet 1 bid for  1 week then 1 qam  2  qhs 90 tablet 5  . doxepin (SINEQUAN) 10 MG capsule 1  qhs 30 capsule 6  . fluticasone (FLONASE) 50 MCG/ACT nasal spray PLACE 1 SPRAY INTO BOTH NOSTRILS DAILY. 16 g 5  . furosemide (LASIX) 20 MG tablet TAKE 1 TABLET (20 MG TOTAL) BY MOUTH TWO   TIMES DAILY. 60 tablet 5  . hydrOXYzine (ATARAX/VISTARIL) 50 MG tablet 1 qhs may repeat 60 tablet 3  . lactulose (CHRONULAC) 10 GM/15ML solution Take 39 ml (20 grams) by mouth daily 1350 mL 1  . LORazepam (ATIVAN) 1 MG tablet 1 qhs 30 tablet 5  . Melatonin 1 MG CAPS TAKE 1 CAPSULE BY MOUTH AT BEDTIME. 30 capsule 5  . pantoprazole (PROTONIX) 40 MG tablet Take 1 tablet (40 mg total) by mouth daily. 90 tablet 1  . PROAIR HFA 108 (90 Base) MCG/ACT inhaler INHALE TWO PUFFS INTO THE LUNGS EVERY SIX HOURS AS NEEDED FOR WHEEZING 8 g 2  . propranolol (INDERAL) 20 MG tablet TAKE 1 TABLET (20 MG TOTAL) BY MOUTH DAILY. 90 tablet 3  . SPIRIVA HANDIHALER 18 MCG inhalation capsule PLACE 1 CAPSULE (18 MCG TOTAL) INTO INHALER AND INHALE DAILY. 30 capsule 3  . spironolactone (ALDACTONE) 50 MG tablet Take 1 tablet (50 mg total)  by mouth daily. 30 tablet 1  . traMADol (ULTRAM) 50 MG tablet TAKE 1 TABLET BY MOUTH EVERY 6 HOUR PRN PAIN **DO NOT FILL BEFORE 02/10/16 PER MD** 120 tablet 0  . traZODone (DESYREL) 50 MG tablet Take 1 tablet (50 mg total) by mouth at bedtime. 30 tablet 6  . XIFAXAN 550 MG TABS tablet TAKE 1 TABLET (550 MG TOTAL) BY MOUTH TWO   (TWO) TIMES DAILY. 60 tablet 5   No current facility-administered medications on file prior to visit.      Objective:   Physical Exam Blood pressure 116/50, pulse 71, temperature 98.4 F (36.9 C), temperature source Oral, height 5\' 3"  (1.6 m), weight 214 lb 1.6 oz (97.115 kg), SpO2 100 %. General Apperance: NAD HEENT: Normocephalic, atraumatic, anicteric sclera Neck: Supple,  trachea midline Lungs: Clear to auscultation bilaterally. No wheezes, rhonchi or rales. Breathing comfortably Heart: Regular rate and rhythm, no murmur/rub/gallop Abdomen: Soft, tender to palpation most in RUQ, no rebound or guarding. Extremities: Warm and well perfused, no edema Skin: No rashes or lesions Neurologic: Alert and interactive. No gross deficits.    Assessment & Plan:  Please refer to problem based charting.

## 2016-02-16 DIAGNOSIS — K8020 Calculus of gallbladder without cholecystitis without obstruction: Secondary | ICD-10-CM | POA: Insufficient documentation

## 2016-02-16 NOTE — Assessment & Plan Note (Signed)
Screened for DM. A1c within normal limits.

## 2016-02-16 NOTE — Assessment & Plan Note (Signed)
She reports LUQ pain greater than RUQ but on exam she is tender in the RUQ. [place] with gallstones. She had no sonographic Murphys sign. She has postprandial pain.  Given her cirrhosis, would refer her to tertiary center for consideration of cholecystectomy, which she is interested in.

## 2016-02-16 NOTE — Assessment & Plan Note (Addendum)
Assessment: Using albuterol still. Minimal improvement in symptoms.  Plan: Change Symbicort to 160-4.32mcg 2 puff BID Continue Spiriva Continue albuterol prn

## 2016-02-16 NOTE — Assessment & Plan Note (Addendum)
She has been evaluated by sports medicine and orthopedics (Dr. Doran Durand). 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 Needs surgery but has cirrhosis. Needs evaluation at tertiary center.  Referral to orthopedics at [facility] Voltaren gel QID Will try to downtitrate her tramadol by 10% at next refill.

## 2016-02-19 NOTE — Progress Notes (Signed)
Medicine attending: Medical history, presenting problems, physical findings, and medications, reviewed with resident physician Dr Jennifer Krall on the day of the patient visit and I concur with her evaluation and management plan. 

## 2016-02-22 ENCOUNTER — Other Ambulatory Visit: Payer: Self-pay

## 2016-02-22 NOTE — Telephone Encounter (Signed)
Pt requesting the nurse to call back. 

## 2016-02-22 NOTE — Telephone Encounter (Signed)
Made return call to patient-no answer.  Left message on recorder.Despina Hidden Cassady4/27/20171:55 PM

## 2016-02-26 ENCOUNTER — Observation Stay (HOSPITAL_COMMUNITY)
Admission: EM | Admit: 2016-02-26 | Discharge: 2016-02-28 | Disposition: A | Discharge status: Home / Self Care | Payer: Medicaid Other | Attending: Internal Medicine | Admitting: Internal Medicine

## 2016-02-26 ENCOUNTER — Telehealth: Payer: Self-pay | Admitting: Internal Medicine

## 2016-02-26 ENCOUNTER — Telehealth: Payer: Self-pay

## 2016-02-26 ENCOUNTER — Emergency Department (HOSPITAL_COMMUNITY): Payer: Medicaid Other

## 2016-02-26 ENCOUNTER — Encounter (HOSPITAL_COMMUNITY): Payer: Self-pay | Admitting: Emergency Medicine

## 2016-02-26 DIAGNOSIS — E876 Hypokalemia: Secondary | ICD-10-CM | POA: Insufficient documentation

## 2016-02-26 DIAGNOSIS — R1084 Generalized abdominal pain: Secondary | ICD-10-CM | POA: Diagnosis not present

## 2016-02-26 DIAGNOSIS — Z66 Do not resuscitate: Secondary | ICD-10-CM | POA: Insufficient documentation

## 2016-02-26 DIAGNOSIS — E871 Hypo-osmolality and hyponatremia: Secondary | ICD-10-CM | POA: Diagnosis not present

## 2016-02-26 DIAGNOSIS — Z79899 Other long term (current) drug therapy: Secondary | ICD-10-CM | POA: Insufficient documentation

## 2016-02-26 DIAGNOSIS — K7460 Unspecified cirrhosis of liver: Secondary | ICD-10-CM | POA: Diagnosis not present

## 2016-02-26 DIAGNOSIS — I10 Essential (primary) hypertension: Secondary | ICD-10-CM | POA: Diagnosis present

## 2016-02-26 DIAGNOSIS — G8929 Other chronic pain: Secondary | ICD-10-CM | POA: Diagnosis not present

## 2016-02-26 DIAGNOSIS — M1990 Unspecified osteoarthritis, unspecified site: Secondary | ICD-10-CM | POA: Diagnosis not present

## 2016-02-26 DIAGNOSIS — Z8541 Personal history of malignant neoplasm of cervix uteri: Secondary | ICD-10-CM | POA: Diagnosis not present

## 2016-02-26 DIAGNOSIS — K8010 Calculus of gallbladder with chronic cholecystitis without obstruction: Secondary | ICD-10-CM | POA: Insufficient documentation

## 2016-02-26 DIAGNOSIS — F419 Anxiety disorder, unspecified: Secondary | ICD-10-CM | POA: Diagnosis not present

## 2016-02-26 DIAGNOSIS — K7682 Hepatic encephalopathy: Secondary | ICD-10-CM

## 2016-02-26 DIAGNOSIS — F329 Major depressive disorder, single episode, unspecified: Secondary | ICD-10-CM | POA: Insufficient documentation

## 2016-02-26 DIAGNOSIS — B182 Chronic viral hepatitis C: Secondary | ICD-10-CM

## 2016-02-26 DIAGNOSIS — R188 Other ascites: Secondary | ICD-10-CM | POA: Diagnosis not present

## 2016-02-26 DIAGNOSIS — G4700 Insomnia, unspecified: Secondary | ICD-10-CM | POA: Insufficient documentation

## 2016-02-26 DIAGNOSIS — D696 Thrombocytopenia, unspecified: Secondary | ICD-10-CM | POA: Diagnosis present

## 2016-02-26 DIAGNOSIS — R062 Wheezing: Secondary | ICD-10-CM | POA: Insufficient documentation

## 2016-02-26 DIAGNOSIS — Z87891 Personal history of nicotine dependence: Secondary | ICD-10-CM | POA: Diagnosis not present

## 2016-02-26 DIAGNOSIS — K8020 Calculus of gallbladder without cholecystitis without obstruction: Secondary | ICD-10-CM | POA: Diagnosis present

## 2016-02-26 DIAGNOSIS — M25571 Pain in right ankle and joints of right foot: Secondary | ICD-10-CM

## 2016-02-26 DIAGNOSIS — J441 Chronic obstructive pulmonary disease with (acute) exacerbation: Principal | ICD-10-CM | POA: Insufficient documentation

## 2016-02-26 DIAGNOSIS — Z79891 Long term (current) use of opiate analgesic: Secondary | ICD-10-CM | POA: Diagnosis present

## 2016-02-26 DIAGNOSIS — K7290 Hepatic failure, unspecified without coma: Secondary | ICD-10-CM

## 2016-02-26 DIAGNOSIS — R1011 Right upper quadrant pain: Secondary | ICD-10-CM

## 2016-02-26 LAB — COMPREHENSIVE METABOLIC PANEL
ALK PHOS: 111 U/L (ref 38–126)
ALT: 29 U/L (ref 14–54)
ANION GAP: 10 (ref 5–15)
AST: 58 U/L — ABNORMAL HIGH (ref 15–41)
Albumin: 2.6 g/dL — ABNORMAL LOW (ref 3.5–5.0)
BILIRUBIN TOTAL: 2.9 mg/dL — AB (ref 0.3–1.2)
BUN: 10 mg/dL (ref 6–20)
CALCIUM: 8.1 mg/dL — AB (ref 8.9–10.3)
CO2: 24 mmol/L (ref 22–32)
CREATININE: 0.85 mg/dL (ref 0.44–1.00)
Chloride: 97 mmol/L — ABNORMAL LOW (ref 101–111)
GFR calc non Af Amer: >60 mL/min (ref >60)
Glucose, Bld: 116 mg/dL — ABNORMAL HIGH (ref 65–99)
Potassium: 3.3 mmol/L — ABNORMAL LOW (ref 3.5–5.1)
SODIUM: 131 mmol/L — AB (ref 135–145)
TOTAL PROTEIN: 6 g/dL — AB (ref 6.5–8.1)

## 2016-02-26 LAB — CBC
HCT: 39.6 % (ref 36.0–46.0)
HEMOGLOBIN: 13.7 g/dL (ref 12.0–15.0)
MCH: 35.3 pg — ABNORMAL HIGH (ref 26.0–34.0)
MCHC: 34.6 g/dL (ref 30.0–36.0)
MCV: 102.1 fL — ABNORMAL HIGH (ref 78.0–100.0)
PLATELETS: 87 10*3/uL — AB (ref 150–400)
RBC: 3.88 MIL/uL (ref 3.87–5.11)
RDW: 14.9 % (ref 11.5–15.5)
WBC: 10.7 10*3/uL — ABNORMAL HIGH (ref 4.0–10.5)

## 2016-02-26 LAB — LIPASE, BLOOD: Lipase: 43 U/L (ref 11–51)

## 2016-02-26 MED ORDER — GI COCKTAIL ~~LOC~~
30.0000 mL | Freq: Once | ORAL | Status: AC
  Administered 2016-02-26: 30 mL via ORAL
  Filled 2016-02-26: qty 30

## 2016-02-26 MED ORDER — PREDNISONE 20 MG PO TABS
60.0000 mg | ORAL_TABLET | Freq: Once | ORAL | Status: AC
  Administered 2016-02-26: 60 mg via ORAL
  Filled 2016-02-26: qty 3

## 2016-02-26 MED ORDER — TRAMADOL HCL 50 MG PO TABS
50.0000 mg | ORAL_TABLET | Freq: Once | ORAL | Status: AC
  Administered 2016-02-26: 50 mg via ORAL
  Filled 2016-02-26: qty 1

## 2016-02-26 MED ORDER — IPRATROPIUM-ALBUTEROL 0.5-2.5 (3) MG/3ML IN SOLN
3.0000 mL | Freq: Once | RESPIRATORY_TRACT | Status: AC
  Administered 2016-02-26: 3 mL via RESPIRATORY_TRACT
  Filled 2016-02-26: qty 3

## 2016-02-26 MED ORDER — IPRATROPIUM-ALBUTEROL 0.5-2.5 (3) MG/3ML IN SOLN
3.0000 mL | Freq: Once | RESPIRATORY_TRACT | Status: DC
  Filled 2016-02-26: qty 3

## 2016-02-26 MED ORDER — ONDANSETRON HCL 4 MG/2ML IJ SOLN
4.0000 mg | Freq: Once | INTRAMUSCULAR | Status: AC
  Administered 2016-02-26: 4 mg via INTRAVENOUS
  Filled 2016-02-26: qty 2

## 2016-02-26 NOTE — Telephone Encounter (Signed)
   Reason for call:   I received a call from [name] at 6:45  PM indicating that her arthritis pain was not controlled on Tramadol and Voltaren gel.  States that she has pain in her hands, feet, back, and ankles.  She says that she needs something stronger.   Pertinent Data:   Patient seen in [facility] by her PCP on 4/20.  She has previously been evaluated by orthopedics and sports medicine.  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 Unfortunately, she has cirrhosis which complicates her need for surgery.  A referral was placed at that visit to orthopedics at [facility]  She is prescribed Voltaren gel and Tramadol for her pain.  She has an appointment on Thursday 5/4 with her PCP to address pain that was made earlier today.  Patient states she will come to the ED for stronger pain medication when informed that I cannot prescribe her stronger medications or narcotics over the phone.  She says she is not having any fever, chills, chest pain, or SOB.   Assessment / Plan / Recommendations:   Encouraged patient to continue on her prescribed pain regimen of voltaren and tramadol  Encouraged patient to keep her follow up appointment with PCP  As always, pt is advised that if symptoms worsen or new symptoms arise, they should go to an urgent care facility or to to ER for further evaluation.   Jule Ser, DO   02/26/2016, 7:54 PM

## 2016-02-26 NOTE — Telephone Encounter (Signed)
Please call pt back.

## 2016-02-26 NOTE — ED Provider Notes (Signed)
CSN: [id number]     Arrival date & time 02/26/16  2025 History   First MD Initiated Contact with Patient 02/26/16 2029     Chief Complaint  Patient presents with  . Abdominal Pain  . Emesis  . Diarrhea  . Generalized Body Aches    (Consider location/radiation/quality/duration/timing/severity/associated sxs/prior Treatment) Patient is a 55 y.o. female presenting with abdominal pain, vomiting, and diarrhea. The history is provided by the patient and medical records. No language interpreter was used.  Abdominal Pain Associated symptoms: diarrhea, nausea and vomiting   Associated symptoms: no chest pain, no cough, no dysuria, no fever and no shortness of breath   Emesis Associated symptoms: abdominal pain, arthralgias and diarrhea   Associated symptoms: no headaches   Diarrhea Associated symptoms: abdominal pain, arthralgias and vomiting   Associated symptoms: no fever and no headaches    [name] [name] is a 55 y.o. female  with multiple chronic medical conditions (see below) who presents to the Emergency Department complaining of pain "everywhere". Patient admits to abdominal pain which is consistent with her usual abdominal pain which has been going on for approximately a year-patient contributes this pain to her cirrhosis and gastritis. She admits to multiple episodes of NBNB emesis yesterday, along with nausea-these symptoms have resolved today. + diarrhea over the last week. Denies fever, chest pain, dysuria. Admits to wheezing more than normal, but denies shortness of breath. Patient is also complaining of pain in her bilateral feet and back which she states is from a severe arthritis. She states her reason for coming to the emergency department is to get medication for "all of her pain". She has tried Voltaren gel and tramadol and states no improvement in her pain. Per chart review, she was seen by her PCP on 4/20 where she was referred to ortho at [facility] for ankle pain and given rx. For  Voltaren. It appears PCP's plan is to titrate her down on Tramadol at next appointment. She has a follow-up appointment with her primary care provider on May 4 (3 days from now).  Past Medical History  Diagnosis Date  . Alcoholic cirrhosis ([place])   . COPD (chronic obstructive pulmonary disease) ([place])   . Heart palpitations   . Anxiety   . Portal hypertension ([place])   . Esophageal varices ([place])   . Cholelithiasis   . Splenomegaly   . Fatty liver   . Esophagitis 2010  . Tubulovillous adenoma of colon   . Visual hallucination     since 09/2010/notes 10/30/2012  . Hepatitis C     chronic hepatitis C and Steatohepatitis (hep grade 2, stage 2-3) per 05/13/08 liver biopsy  . Obesity   . cervical Cancer 1999  . Depression   . Shortness of breath   . Allergy   . Arthritis   . GERD (gastroesophageal reflux disease)   . Hypertension   . Gastritis   . Decompensated hepatic cirrhosis ([place]) 03/07/2015    Side effect of Vikera  . Adverse drug effect - [facility] 03/07/2015   Past Surgical History  Procedure Laterality Date  . Cervical cancer surgery    . Abdominal hysterectomy  1999  . Tubal ligation  1982  . Esophagogastroduodenoscopy  10/18/2011    Procedure: ESOPHAGOGASTRODUODENOSCOPY (EGD);  Surgeon: Owens Loffler, MD;  Location: [facility];  Service: Endoscopy;  Laterality: N/A;   Family History  Problem Relation Age of Onset  . Cervical cancer Sister   . Breast cancer Maternal Aunt   . Heart disease  Father 60    died from MI  . Breast cancer Father   . Cervical cancer Daughter   . Colon cancer Paternal Aunt 27   Social History  Substance Use Topics  . Smoking status: Former Smoker -- 0.10 packs/day for 40 years    Types: Cigarettes    Quit date: 03/28/2012  . Smokeless tobacco: Never Used  . Alcohol Use: No   OB History    Gravida Para Term Preterm AB TAB SAB Ectopic Multiple Living   3 2   1  1   2      Review of Systems  Constitutional: Negative for fever.  HENT:  Negative for congestion.   Eyes: Negative for visual disturbance.  Respiratory: Positive for wheezing. Negative for cough and shortness of breath.   Cardiovascular: Negative for chest pain, palpitations and leg swelling.  Gastrointestinal: Positive for nausea, vomiting, abdominal pain and diarrhea.  Genitourinary: Negative for dysuria.  Musculoskeletal: Positive for arthralgias.  Skin: Negative for rash.  Neurological: Negative for dizziness and headaches.      Allergies  Ibuprofen; Tylenol; and Amitriptyline  Home Medications   Prior to Admission medications   Medication Sig Start Date End Date Taking? Authorizing Provider  budesonide-formoterol (SYMBICORT) 160-4.5 MCG/ACT inhaler Inhale 2 puffs into the lungs 2 (two) times daily. 02/15/16  Yes Milagros Loll, MD  busPIRone (BUSPAR) 15 MG tablet 1 bid for  1 week then 1 qam  2  qhs Patient taking differently: 15-30 mg 2 (two) times daily. Takes 1 qam and 2  qhs 11/22/15  Yes Norma Fredrickson, MD  clonazePAM (KLONOPIN) 1 MG tablet Take 1 mg by mouth 3 (three) times daily as needed for anxiety.    Yes Historical Provider, MD  doxepin (SINEQUAN) 10 MG capsule 1  qhs Patient taking differently: Take 10 mg by mouth at bedtime. 1  qhs 11/22/15  Yes Norma Fredrickson, MD  fluticasone (FLONASE) 50 MCG/ACT nasal spray PLACE 1 SPRAY INTO BOTH NOSTRILS DAILY. 07/05/15  Yes Bartholomew Crews, MD  furosemide (LASIX) 20 MG tablet TAKE 1 TABLET (20 MG TOTAL) BY MOUTH TWO   TIMES DAILY. 09/25/15  Yes Milagros Loll, MD  lactulose (CHRONULAC) 10 GM/15ML solution Take 39 ml (20 grams) by mouth daily Patient taking differently: Take 20 g by mouth daily. Take 30 ml (20 grams) by mouth daily 05/11/15  Yes Marjan Rabbani, MD  LORazepam (ATIVAN) 1 MG tablet 1 qhs Patient taking differently: Take 1 mg by mouth at bedtime. 1 qhs 11/22/15  Yes Norma Fredrickson, MD  Melatonin 1 MG CAPS TAKE 1 CAPSULE BY MOUTH AT BEDTIME. 01/26/16  Yes Milagros Loll, MD   pantoprazole (PROTONIX) 40 MG tablet Take 1 tablet (40 mg total) by mouth daily. 10/19/15  Yes Milagros Loll, MD  PROAIR HFA 108 [id number] Base) MCG/ACT inhaler INHALE TWO PUFFS INTO THE LUNGS EVERY SIX HOURS AS NEEDED FOR WHEEZING 01/28/16  Yes Milagros Loll, MD  propranolol (INDERAL) 20 MG tablet TAKE 1 TABLET (20 MG TOTAL) BY MOUTH DAILY. 12/26/15  Yes Milagros Loll, MD  SPIRIVA HANDIHALER 18 MCG inhalation capsule PLACE 1 CAPSULE (18 MCG TOTAL) INTO INHALER AND INHALE DAILY. 08/11/15  Yes Milagros Loll, MD  traMADol (ULTRAM) 50 MG tablet TAKE 1 TABLET BY MOUTH EVERY 6 HOUR PRN PAIN **DO NOT FILL BEFORE 02/10/16 PER MD** 02/10/16  Yes Milagros Loll, MD  traZODone (DESYREL) 50 MG tablet Take 1 tablet (50 mg total) by mouth at bedtime.  11/22/15  Yes Norma Fredrickson, MD  XIFAXAN 550 MG TABS tablet TAKE 1 TABLET (550 MG TOTAL) BY MOUTH TWO   (TWO) TIMES DAILY. 01/26/16  Yes Milagros Loll, MD  diclofenac sodium (VOLTAREN) 1 % GEL Apply 4 g topically 4 (four) times daily. 02/15/16   Milagros Loll, MD  hydrOXYzine (ATARAX/VISTARIL) 50 MG tablet 1 qhs may repeat 11/09/14   Norma Fredrickson, MD  spironolactone (ALDACTONE) 50 MG tablet Take 1 tablet (50 mg total) by mouth daily. 10/19/15   Milagros Loll, MD   BP 115/65 mmHg  Pulse 78  Temp(Src) 98.2 F (36.8 C) (Oral)  Resp 16  SpO2 93% Physical Exam  Constitutional: She is oriented to person, place, and time. She appears well-developed and well-nourished.  Alert and in no acute distress  HENT:  Head: Normocephalic and atraumatic.  Cardiovascular: Normal rate, regular rhythm, normal heart sounds and intact distal pulses.  Exam reveals no gallop and no friction rub.   No murmur heard. Pulmonary/Chest: Effort normal. No respiratory distress. She has wheezes (Scattered expiratory wheezing bilaterally.). She has no rales. She exhibits no tenderness.  Abdominal: Soft. Bowel sounds are normal. She exhibits no distension and no mass. There is no  rebound and no guarding.  Mild TTP of right upper quadrant.   Musculoskeletal: Normal range of motion. She exhibits no edema.  Neurological: She is alert and oriented to person, place, and time.  Skin: Skin is warm and dry.  Nursing note and vitals reviewed.   ED Course  Procedures (including critical care time) Labs Review Labs Reviewed  COMPREHENSIVE METABOLIC PANEL - Abnormal; Notable for the following:    Sodium 131 (*)    Potassium 3.3 (*)    Chloride 97 (*)    Glucose, Bld 116 (*)    Calcium 8.1 (*)    Total Protein 6.0 (*)    Albumin 2.6 (*)    AST 58 (*)    Total Bilirubin 2.9 (*)    All other components within normal limits  CBC - Abnormal; Notable for the following:    WBC 10.7 (*)    MCV 102.1 (*)    MCH 35.3 (*)    Platelets 87 (*)    All other components within normal limits  LIPASE, BLOOD  URINALYSIS, ROUTINE W REFLEX MICROSCOPIC (NOT AT [facility])    Imaging Review Dg Chest 2 View  02/26/2016  CLINICAL DATA:  Shortness of breath and wheezing for 2 weeks EXAM: CHEST  2 VIEW COMPARISON:  Mar 05, 2015 FINDINGS: There is no edema or consolidation. Heart is upper normal in size with pulmonary vascularity within normal limits. No adenopathy. No bone lesions. IMPRESSION: No edema or consolidation. Electronically Signed   By: Lowella Grip III M.D.   On: 02/26/2016 21:23   I have personally reviewed and evaluated these images and lab results as part of my medical decision-making.   EKG Interpretation None      MDM   Final diagnoses:  Wheezing   [name] [name] presents to ED for multiple complaints. Abdominal pain and arthralgias appear to be chronic issues with no acute changes. Pt. Does have significant wheezing on lung exam - will order duoneb and reassess.   CBC, CMP, and lipase reviewed. CXR reassuring.  Patient re-evaluated and is still having difficulty keeping O2% in good range. He is not on oxygen at home, however she is requiring 2 L O2 to remain above  90% in ED today. Will give steroids and another  breathing treatment with admission for further eval and mgt of COPD exacerbation.   Consulted IM service, Dr. Heber , who will admit.   Urine pending at admission.   Patient seen by and discussed with Dr. Eulis Foster who agrees with treatment plan.     [facility] Kanija Remmel, PA-C 02/27/16 0007  Daleen Bo, MD 02/27/16 [phone]

## 2016-02-26 NOTE — ED Provider Notes (Signed)
  Face-to-face evaluation   History: Patient complains of generalized pain for several days, associated with occasional vomiting and diarrhea. She has also had ongoing cough for several weeks, and worsening shortness of breath recently. She states that she does not smoke.  Physical exam: Alert, calm, cooperative. She appears depressed. Lungs with poor air movement bilaterally, bilateral wheezes with scattered rhonchi. There is no increased work of breathing.  Medical decision-making- COPD exacerbation, with hypoxia. He will require admission for supportive oxygenation, and further evaluation of her generalized pain.  Medical screening examination/treatment/procedure(s) were conducted as a shared visit with non-physician practitioner(s) and myself.  I personally evaluated the patient during the encounter  Daleen Bo, MD 02/27/16 1349

## 2016-02-26 NOTE — Telephone Encounter (Signed)
Pt calls and states she needs something more than tramadol and voltaren gel, she states neither helps the degree of pain she has, she states she needs something asap. She is told that she will need to be seen in the office, appt is made for thurs 5/4 at 1515, she states she shouldn't need to be seen for something stronger, she is informed that she will need to be seen for a stronger narcotic and she states she didn't ask for a narcotic that she needs something strong for her pain. She states she [facility] at [facility] [place] Thursday am and she knows they will put her in the hospital and that her husband says she needs something stronger for her arthritis. She is asked to keep her appt thurs pm and is agreeable

## 2016-02-26 NOTE — ED Notes (Signed)
Unable to give urine specimen at this time .  

## 2016-02-26 NOTE — ED Notes (Signed)
Pt. arrived with EMS from home with multiple complaints : upper abdominal pain with emesis and diarrhea x1 week , generalized body aches and fatigue , denies fever or or chills.

## 2016-02-27 ENCOUNTER — Observation Stay (HOSPITAL_COMMUNITY): Payer: Medicaid Other

## 2016-02-27 ENCOUNTER — Encounter (HOSPITAL_COMMUNITY): Payer: Self-pay | Admitting: General Practice

## 2016-02-27 DIAGNOSIS — J441 Chronic obstructive pulmonary disease with (acute) exacerbation: Secondary | ICD-10-CM | POA: Diagnosis not present

## 2016-02-27 DIAGNOSIS — R1011 Right upper quadrant pain: Secondary | ICD-10-CM

## 2016-02-27 DIAGNOSIS — R197 Diarrhea, unspecified: Secondary | ICD-10-CM

## 2016-02-27 DIAGNOSIS — K8020 Calculus of gallbladder without cholecystitis without obstruction: Secondary | ICD-10-CM

## 2016-02-27 DIAGNOSIS — F418 Other specified anxiety disorders: Secondary | ICD-10-CM

## 2016-02-27 DIAGNOSIS — G4700 Insomnia, unspecified: Secondary | ICD-10-CM

## 2016-02-27 DIAGNOSIS — R112 Nausea with vomiting, unspecified: Secondary | ICD-10-CM

## 2016-02-27 DIAGNOSIS — E876 Hypokalemia: Secondary | ICD-10-CM

## 2016-02-27 DIAGNOSIS — G8929 Other chronic pain: Secondary | ICD-10-CM

## 2016-02-27 DIAGNOSIS — E871 Hypo-osmolality and hyponatremia: Secondary | ICD-10-CM

## 2016-02-27 DIAGNOSIS — M159 Polyosteoarthritis, unspecified: Secondary | ICD-10-CM

## 2016-02-27 LAB — URINE MICROSCOPIC-ADD ON

## 2016-02-27 LAB — CBC
HEMATOCRIT: 40.9 % (ref 36.0–46.0)
Hemoglobin: 13.9 g/dL (ref 12.0–15.0)
MCH: 34.9 pg — ABNORMAL HIGH (ref 26.0–34.0)
MCHC: 34 g/dL (ref 30.0–36.0)
MCV: 102.8 fL — AB (ref 78.0–100.0)
PLATELETS: 85 10*3/uL — AB (ref 150–400)
RBC: 3.98 MIL/uL (ref 3.87–5.11)
RDW: 14.8 % (ref 11.5–15.5)
WBC: 5.4 10*3/uL (ref 4.0–10.5)

## 2016-02-27 LAB — COMPREHENSIVE METABOLIC PANEL
ALT: 30 U/L (ref 14–54)
AST: 62 U/L — ABNORMAL HIGH (ref 15–41)
Albumin: 2.7 g/dL — ABNORMAL LOW (ref 3.5–5.0)
Alkaline Phosphatase: 122 U/L (ref 38–126)
Anion gap: 11 (ref 5–15)
BUN: 10 mg/dL (ref 6–20)
CO2: 23 mmol/L (ref 22–32)
CREATININE: 0.86 mg/dL (ref 0.44–1.00)
Calcium: 8.2 mg/dL — ABNORMAL LOW (ref 8.9–10.3)
Chloride: 97 mmol/L — ABNORMAL LOW (ref 101–111)
GFR calc Af Amer: >60 mL/min (ref >60)
GLUCOSE: 250 mg/dL — AB (ref 65–99)
POTASSIUM: 3.8 mmol/L (ref 3.5–5.1)
Sodium: 131 mmol/L — ABNORMAL LOW (ref 135–145)
TOTAL PROTEIN: 6.4 g/dL — AB (ref 6.5–8.1)
Total Bilirubin: 2.7 mg/dL — ABNORMAL HIGH (ref 0.3–1.2)

## 2016-02-27 LAB — URINALYSIS, ROUTINE W REFLEX MICROSCOPIC
Bilirubin Urine: NEGATIVE
Glucose, UA: NEGATIVE mg/dL
KETONES UR: NEGATIVE mg/dL
NITRITE: NEGATIVE
PH: 6 (ref 5.0–8.0)
Protein, ur: NEGATIVE mg/dL
Specific Gravity, Urine: 1.008 (ref 1.005–1.030)

## 2016-02-27 MED ORDER — NICOTINE 14 MG/24HR TD PT24
14.0000 mg | MEDICATED_PATCH | Freq: Every day | TRANSDERMAL | Status: DC
  Administered 2016-02-27: 14 mg via TRANSDERMAL
  Filled 2016-02-27: qty 1

## 2016-02-27 MED ORDER — LACTULOSE 10 GM/15ML PO SOLN
20.0000 g | Freq: Every day | ORAL | Status: DC
  Administered 2016-02-27: 20 g via ORAL
  Filled 2016-02-27 (×2): qty 30

## 2016-02-27 MED ORDER — TRAMADOL HCL 50 MG PO TABS
50.0000 mg | ORAL_TABLET | Freq: Four times a day (QID) | ORAL | Status: DC | PRN
  Administered 2016-02-27 – 2016-02-28 (×2): 50 mg via ORAL
  Filled 2016-02-27 (×2): qty 1

## 2016-02-27 MED ORDER — PROPRANOLOL HCL 20 MG PO TABS
20.0000 mg | ORAL_TABLET | Freq: Every day | ORAL | Status: DC
  Administered 2016-02-27 – 2016-02-28 (×2): 20 mg via ORAL
  Filled 2016-02-27 (×2): qty 1

## 2016-02-27 MED ORDER — RIFAXIMIN 550 MG PO TABS
550.0000 mg | ORAL_TABLET | Freq: Two times a day (BID) | ORAL | Status: DC
  Administered 2016-02-27 – 2016-02-28 (×4): 550 mg via ORAL
  Filled 2016-02-27 (×5): qty 1

## 2016-02-27 MED ORDER — IPRATROPIUM-ALBUTEROL 0.5-2.5 (3) MG/3ML IN SOLN
3.0000 mL | Freq: Four times a day (QID) | RESPIRATORY_TRACT | Status: DC
  Administered 2016-02-27 (×3): 3 mL via RESPIRATORY_TRACT
  Filled 2016-02-27 (×3): qty 3

## 2016-02-27 MED ORDER — DOXEPIN HCL 10 MG PO CAPS
10.0000 mg | ORAL_CAPSULE | Freq: Every day | ORAL | Status: DC
  Administered 2016-02-27 (×2): 10 mg via ORAL
  Filled 2016-02-27 (×3): qty 1

## 2016-02-27 MED ORDER — BUSPIRONE HCL 15 MG PO TABS
15.0000 mg | ORAL_TABLET | Freq: Every day | ORAL | Status: DC
  Administered 2016-02-27 – 2016-02-28 (×2): 15 mg via ORAL
  Filled 2016-02-27 (×2): qty 1

## 2016-02-27 MED ORDER — FUROSEMIDE 20 MG PO TABS
20.0000 mg | ORAL_TABLET | Freq: Two times a day (BID) | ORAL | Status: DC
  Administered 2016-02-27 – 2016-02-28 (×3): 20 mg via ORAL
  Filled 2016-02-27 (×3): qty 1

## 2016-02-27 MED ORDER — DEXTROSE 5 % IV SOLN
1.0000 g | INTRAVENOUS | Status: DC
  Administered 2016-02-27 – 2016-02-28 (×2): 1 g via INTRAVENOUS
  Filled 2016-02-27 (×2): qty 10

## 2016-02-27 MED ORDER — TRAZODONE HCL 50 MG PO TABS
50.0000 mg | ORAL_TABLET | Freq: Every day | ORAL | Status: DC
  Administered 2016-02-27 (×2): 50 mg via ORAL
  Filled 2016-02-27 (×2): qty 1

## 2016-02-27 MED ORDER — PREDNISONE 50 MG PO TABS
60.0000 mg | ORAL_TABLET | Freq: Every day | ORAL | Status: DC
  Administered 2016-02-27 – 2016-02-28 (×2): 60 mg via ORAL
  Filled 2016-02-27 (×2): qty 1

## 2016-02-27 MED ORDER — LORAZEPAM 1 MG PO TABS
1.0000 mg | ORAL_TABLET | Freq: Every day | ORAL | Status: DC
  Administered 2016-02-27: 1 mg via ORAL
  Filled 2016-02-27: qty 1

## 2016-02-27 MED ORDER — ENOXAPARIN SODIUM 40 MG/0.4ML ~~LOC~~ SOLN: 40.0000 mg | SUBCUTANEOUS | Status: DC

## 2016-02-27 MED ORDER — BUSPIRONE HCL 15 MG PO TABS
30.0000 mg | ORAL_TABLET | Freq: Every day | ORAL | Status: DC
  Administered 2016-02-27 (×2): 30 mg via ORAL
  Filled 2016-02-27: qty 2
  Filled 2016-02-27: qty 3

## 2016-02-27 MED ORDER — PANTOPRAZOLE SODIUM 40 MG PO TBEC
40.0000 mg | DELAYED_RELEASE_TABLET | Freq: Every day | ORAL | Status: DC
  Administered 2016-02-27 – 2016-02-28 (×2): 40 mg via ORAL
  Filled 2016-02-27 (×2): qty 1

## 2016-02-27 MED ORDER — ALBUTEROL SULFATE (2.5 MG/3ML) 0.083% IN NEBU: 2.5000 mg | INHALATION_SOLUTION | RESPIRATORY_TRACT | Status: DC | PRN

## 2016-02-27 NOTE — Progress Notes (Signed)
Patient admitted to the unit vital signs stable.  Patients states in front of husband that she does not want to be DNR.  Will notify MD. Daryll Drown.  Patient oriented to the unit and verbalized understanding.  Head to toe assessment complete

## 2016-02-27 NOTE — ED Notes (Signed)
Patient transported to Ultrasound 

## 2016-02-27 NOTE — H&P (Signed)
Date: 02/27/2016               Patient Name:  [name] [name] MRN: [id number]  DOB: 03-Jun-1961 Age / Sex: 55 y.o., female   PCP: Milagros Loll, MD         Medical Service: Internal Medicine Teaching Service         Attending Physician: Dr. Sid Falcon, MD    First Contact: Dr. Juleen China Pager: [phone]  Second Contact: Dr. Redmond Pulling Pager: [phone]       After Hours (After 5p/  First Contact Pager: [phone]  weekends / holidays): Second Contact Pager: [phone]   Chief Complaint: Abdominal pain  History of Present Illness: Patient is a 55 y/o woman with past medical history significant for liver cirrhosis former alcohol and hepatitis C with esophageal varices and portal hypertension, COPD, gastritis, osteoarthritis, anxiety and depression presenting to the emergency department for abdominal pain. Her pain is ongoing for about the past month with slightly increasing severity. This abdominal pain is diffuse. The pain occurs continuously every day but is intermittently exacerbated when eating particularly fatty or spicy foods. The pain does not radiate into her chest or back although she does have chronic back pain which is separate. Several days ago her symptoms worsened and developed watery diarrhea at least 3 times daily. Since yesterday there is also increased nausea with her pain and has not been able to tolerate much solid food without vomiting. She reports multiple episodes of nonbloody nonbilious vomiting yesterday that are improved today. She reports some subjective chills throughout this time. She also complains of substantial diffuse pain in the sides, back, hands, and feet attributed to her arthritis that are mostly at or near their baseline severity. Her home medications for these are tramadol 50mg  q6hrs and voltaren gel but feels her pain is not controlled at all. She also has noticed some increase in cough with sputum production over this past month. This cough produces clear sputum  exclusively. She denies significant dyspnea or pain associated with her coughing. She describes appropriate use of her spiriva and symbicort inhalers every day at home plus albuterol as needed but complains they do nothing for her symptoms. After arrival in the ED she is noted to become mildly hypoxic into the 80%s SpO2 on room air. She has never required supplemental oxygen at home. Oxygenation improved above 90% with increased FiO2 by nasal cannula.  Meds: Current Facility-Administered Medications  Medication Dose Route Frequency Provider Last Rate Last Dose  . albuterol (PROVENTIL) (2.5 MG/3ML) 0.083% nebulizer solution 2.5 mg  2.5 mg Nebulization Q2H PRN Collier Salina, MD      . busPIRone (BUSPAR) tablet 15 mg  15 mg Oral Daily Sid Falcon, MD       And  . busPIRone (BUSPAR) tablet 30 mg  30 mg Oral QHS Sid Falcon, MD   30 mg at 02/27/16 0255  . doxepin (SINEQUAN) capsule 10 mg  10 mg Oral QHS Sid Falcon, MD   10 mg at 02/27/16 0250  . furosemide (LASIX) tablet 20 mg  20 mg Oral BID Collier Salina, MD      . ipratropium-albuterol (DUONEB) 0.5-2.5 (3) MG/3ML nebulizer solution 3 mL  3 mL Nebulization Once [facility], PA-C   Stopped at 02/26/16 2252  . ipratropium-albuterol (DUONEB) 0.5-2.5 (3) MG/3ML nebulizer solution 3 mL  3 mL Nebulization Q6H Collier Salina, MD   3 mL at 02/27/16 0250  . lactulose ([place])  10 GM/15ML solution 20 g  20 g Oral Daily Collier Salina, MD      . LORazepam (ATIVAN) tablet 1 mg  1 mg Oral QHS Collier Salina, MD      . pantoprazole (PROTONIX) EC tablet 40 mg  40 mg Oral Daily Collier Salina, MD      . predniSONE (DELTASONE) tablet 60 mg  60 mg Oral Q breakfast Collier Salina, MD      . propranolol (INDERAL) tablet 20 mg  20 mg Oral Daily Collier Salina, MD      . rifaximin Doreene Nest) tablet 550 mg  550 mg Oral BID Sid Falcon, MD   550 mg at 02/27/16 0251  . traMADol (ULTRAM) tablet 50 mg  50 mg Oral Q6H PRN  Collier Salina, MD      . traZODone (DESYREL) tablet 50 mg  50 mg Oral QHS Sid Falcon, MD   50 mg at 02/27/16 0255   Current Outpatient Prescriptions  Medication Sig Dispense Refill  . budesonide-formoterol (SYMBICORT) 160-4.5 MCG/ACT inhaler Inhale 2 puffs into the lungs 2 (two) times daily. 1 Inhaler 2  . busPIRone (BUSPAR) 15 MG tablet 1 bid for  1 week then 1 qam  2  qhs (Patient taking differently: 15-30 mg 2 (two) times daily. Takes 1 qam and 2  qhs) 90 tablet 5  . clonazePAM (KLONOPIN) 1 MG tablet Take 1 mg by mouth 3 (three) times daily as needed for anxiety.     Marland Kitchen doxepin (SINEQUAN) 10 MG capsule 1  qhs (Patient taking differently: Take 10 mg by mouth at bedtime. 1  qhs) 30 capsule 6  . fluticasone (FLONASE) 50 MCG/ACT nasal spray PLACE 1 SPRAY INTO BOTH NOSTRILS DAILY. 16 g 5  . furosemide (LASIX) 20 MG tablet TAKE 1 TABLET (20 MG TOTAL) BY MOUTH TWO   TIMES DAILY. 60 tablet 5  . lactulose (CHRONULAC) 10 GM/15ML solution Take 39 ml (20 grams) by mouth daily (Patient taking differently: Take 20 g by mouth daily. Take 30 ml (20 grams) by mouth daily) 1350 mL 1  . LORazepam (ATIVAN) 1 MG tablet 1 qhs (Patient taking differently: Take 1 mg by mouth at bedtime. 1 qhs) 30 tablet 5  . Melatonin 1 MG CAPS TAKE 1 CAPSULE BY MOUTH AT BEDTIME. 30 capsule 5  . pantoprazole (PROTONIX) 40 MG tablet Take 1 tablet (40 mg total) by mouth daily. 90 tablet 1  . PROAIR HFA 108 (90 Base) MCG/ACT inhaler INHALE TWO PUFFS INTO THE LUNGS EVERY SIX HOURS AS NEEDED FOR WHEEZING 8 g 2  . propranolol (INDERAL) 20 MG tablet TAKE 1 TABLET (20 MG TOTAL) BY MOUTH DAILY. 90 tablet 3  . SPIRIVA HANDIHALER 18 MCG inhalation capsule PLACE 1 CAPSULE (18 MCG TOTAL) INTO INHALER AND INHALE DAILY. 30 capsule 3  . traMADol (ULTRAM) 50 MG tablet TAKE 1 TABLET BY MOUTH EVERY 6 HOUR PRN PAIN **DO NOT FILL BEFORE 02/10/16 PER MD** 120 tablet 0  . traZODone (DESYREL) 50 MG tablet Take 1 tablet (50 mg total) by mouth at  bedtime. 30 tablet 6  . XIFAXAN 550 MG TABS tablet TAKE 1 TABLET (550 MG TOTAL) BY MOUTH TWO   (TWO) TIMES DAILY. 60 tablet 5  . diclofenac sodium (VOLTAREN) 1 % GEL Apply 4 g topically 4 (four) times daily. 100 g 3  . hydrOXYzine (ATARAX/VISTARIL) 50 MG tablet 1 qhs may repeat 60 tablet 3  . spironolactone (ALDACTONE) 50 MG tablet Take  1 tablet (50 mg total) by mouth daily. 30 tablet 1    Allergies: Allergies as of 02/26/2016 - Review Complete 02/26/2016  Allergen Reaction Noted  . Ibuprofen Other (See Comments) 02/26/2016  . Tylenol [acetaminophen] Other (See Comments) 03/29/2015  . Amitriptyline Other (See Comments) 01/22/2013   Past Medical History  Diagnosis Date  . Alcoholic cirrhosis ([place])   . COPD (chronic obstructive pulmonary disease) ([place])   . Heart palpitations   . Anxiety   . Portal hypertension ([place])   . Esophageal varices ([place])   . Cholelithiasis   . Splenomegaly   . Fatty liver   . Esophagitis 2010  . Tubulovillous adenoma of colon   . Visual hallucination     since 09/2010/notes 10/30/2012  . Hepatitis C     chronic hepatitis C and Steatohepatitis (hep grade 2, stage 2-3) per 05/13/08 liver biopsy  . Obesity   . cervical Cancer 1999  . Depression   . Shortness of breath   . Allergy   . Arthritis   . GERD (gastroesophageal reflux disease)   . Hypertension   . Gastritis   . Decompensated hepatic cirrhosis ([place]) 03/07/2015    Side effect of [place]  . Adverse drug effect - [place] 03/07/2015   Past Surgical History  Procedure Laterality Date  . Cervical cancer surgery    . Abdominal hysterectomy  1999  . Tubal ligation  1982  . Esophagogastroduodenoscopy  10/18/2011    Procedure: ESOPHAGOGASTRODUODENOSCOPY (EGD);  Surgeon: Owens Loffler, MD;  Location: [facility];  Service: Endoscopy;  Laterality: N/A;   Family History  Problem Relation Age of Onset  . Cervical cancer Sister   . Breast cancer Maternal Aunt   . Heart disease Father 65    died from  MI  . Breast cancer Father   . Cervical cancer Daughter   . Colon cancer Paternal Aunt 3   Social History   Social History  . Marital Status: Legally Separated    Spouse Name: N/A  . Number of Children: N/A  . Years of Education: N/A   Occupational History  . Not on file.   Social History Main Topics  . Smoking status: Former Smoker -- 0.10 packs/day for 40 years    Types: Cigarettes    Quit date: 03/28/2012  . Smokeless tobacco: Never Used  . Alcohol Use: No  . Drug Use: No  . Sexual Activity: Not on file   Other Topics Concern  . Not on file   Social History Narrative    Review of Systems: Review of Systems  Constitutional: Positive for chills.  HENT: Negative for sore throat and tinnitus.   Eyes: Negative for blurred vision.  Respiratory: Positive for sputum production. Negative for cough.   Cardiovascular: Positive for leg swelling. Negative for chest pain and palpitations.  Gastrointestinal: Positive for nausea, abdominal pain and diarrhea. Negative for vomiting.  Genitourinary: Negative for dysuria and hematuria.  Musculoskeletal: Positive for myalgias, back pain and joint pain. Negative for falls.  Skin: Negative for rash.  Neurological: Positive for headaches. Negative for focal weakness.  Endo/Heme/Allergies: Does not bruise/bleed easily.  Psychiatric/Behavioral: Positive for depression. Negative for substance abuse. The patient is nervous/anxious and has insomnia.    Physical Exam: Blood pressure 110/66, pulse 80, temperature 98.2 F (36.8 C), temperature source Oral, resp. rate 23, SpO2 92 %.  GENERAL- alert, co-operative, NAD HEENT- Oral mucosa appears moist, no sinus tenderness, no cervical LAN, no JVD CARDIAC- RRR, no murmurs, rubs or gallops.  RESP- Diffuse expiratory wheezes in all lung fields, no appreciable crackles, fair air movement ABDOMEN- Soft, no guarding, bowel sounds present throughout, TTP worst over the RUQ, mild tenderness  diffusely NEURO- No obvious Cr N abnormality, strength upper and lower extremities- 5/5, Sensation intact globally EXTREMITIES- pulse 2+, symmetric, 1+ pitting edema in shins b/l, right 2nd toe medially deviated overlying 1st toe SKIN- Warm, dry, venous stasis changes over b/l shins and feet PSYCH- Labile mood and affect, became tearful during encounter   Lab results: Basic Metabolic Panel:  Recent Labs  02/26/16 2110  NA 131*  K 3.3*  CL 97*  CO2 24  GLUCOSE 116*  BUN 10  CREATININE 0.85  CALCIUM 8.1*   Liver Function Tests:  Recent Labs  02/26/16 2110  AST 58*  ALT 29  ALKPHOS 111  BILITOT 2.9*  PROT 6.0*  ALBUMIN 2.6*    Recent Labs  02/26/16 2110  LIPASE 43   CBC:  Recent Labs  02/26/16 2110  WBC 10.7*  HGB 13.7  HCT 39.6  MCV 102.1*  PLT 87*   Urinalysis:  Recent Labs  02/26/16 2345  COLORURINE YELLOW  LABSPEC 1.008  PHURINE 6.0  GLUCOSEU NEGATIVE  HGBUR TRACE*  BILIRUBINUR NEGATIVE  KETONESUR NEGATIVE  PROTEINUR NEGATIVE  NITRITE NEGATIVE  LEUKOCYTESUR TRACE*     Imaging results:  Dg Chest 2 View  02/26/2016  CLINICAL DATA:  Shortness of breath and wheezing for 2 weeks EXAM: CHEST  2 VIEW COMPARISON:  Mar 05, 2015 FINDINGS: There is no edema or consolidation. Heart is upper normal in size with pulmonary vascularity within normal limits. No adenopathy. No bone lesions. IMPRESSION: No edema or consolidation. Electronically Signed   By: Lowella Grip III M.D.   On: 02/26/2016 21:23   US Abdomen Limited Ruq  02/27/2016  CLINICAL DATA:  Right upper quadrant pain. EXAM: US ABDOMEN LIMITED - RIGHT UPPER QUADRANT COMPARISON:  01/05/2016 FINDINGS: Gallbladder: Multiple stones in the dependent gallbladder. Gallbladder wall thickening along the liver surface. Cannot assess for Murphy's sign due to pain medications. Common bile duct: Diameter: 5.5 mm, normal Liver: Limited visualization due to body habitus and rib shadows. Visualized portions  demonstrate normal parenchymal pattern. IMPRESSION: Cholelithiasis with focal gallbladder wall thickening. Changes are nonspecific but could be associated with acute cholecystitis in the appropriate clinical setting. Gallbladder wall thickening is new since previous study. Electronically Signed   By: Lucienne Capers M.D.   On: 02/27/2016 04:27    Assessment & Plan by Problem: COPD Exacerbation: Increased sputum production and hypoxia of 88% noted without major complaints reported by the patient PTA. During examination desaturated to 80s reproducibly if decreasing supplemental oxygen or with prolonged speaking. CXR is unremarkable at this time. She reports being compliant with inhaler therapies at home. She does have recent onset of diarrhea and increased sputum production may be consistent with a viral infectious trigger. She does not have significant metabolic compensation for chronic CO2 retention. Also in the differential less likely for her hypoxia could be hepatopulmonary syndrome. -duonebs q6hrs -albuterol neb q2hrs PRN -Supplemental O2 maintain 88-92% -prednisone 60mg  qAM 5 days -Ambulate daily with pulse oximetry  Abdominal pain, vomiting, diarrhea: Abdominal pain ongoing for the past month that fits well with a pattern for symptomatic cholelithiasis. She had previous US study 01/05/16 that demonstrated cholelithiasis and cirrhosis, without evidence of biliary obstruction. She does not have fever or obstructive abnormalities on Cmet. Physical exam reproduces RUQ TTP readily. GI cocktail had no effect on symptoms  making gastritis a less likely cause. She also has some diffuse pain and associated diarrhea, so gastroenteritis is a possible explanation of her acutely worsened symptoms. -Repeat RUQ [place] -antiemetics if vomiting recurs -IVF if stops tolerating PO fluids/vomiting recurs  Liver cirrhosis: MELD score 15. Ascites appears minimal with 1+ pedal edema but abdomen nondistended. She has known  esophageal varices but has never bled and is on daily propanolol. Encephalopathy Hx but now well controlled. If there is appreciable ascites accumulated fluid analysis would be indicated for this abdominal pain to r/o SBP. -Continue home lasix 20mg  PO BID, spironolactone 50mg  -Lactulose 20g, rifaxamin 550mg  BID -Repeat Cmet in AM  Hyponatremia, hypokalemia: Most likely related to her diarrhea, vomiting, and decreased PO intake for several days. Also on diuretics chronically for compensated cirrhosis that could further exacerbate an intravascular depletion. -Repeat AM chem panel -Replace electrolytes PRN  Chronic pain, osteoarthritis: Significant arthritis with limitations on activity tolerance due to pain. Unlikely related to her acutely worsened abdominal pain. Tried voltaren gel with no improvement. -Continue home tramadol 50mg  q6hrs PRN  Anxiety, depression: Stable. Continue home Buspar 15mg  qAM 30mg  qPM  Insomnia: Stable. Continue home ativan 1mg  qhs, trazodone 50mg  qhs, doxepin 10mg  qhs  DNR/DNI Diet: Regular VTE ppx: SCDs  Dispo: Disposition is deferred at this time, awaiting improvement of current medical problems. Anticipated discharge in approximately 1-2 day(s).   The patient does have a current PCP Milagros Loll, MD) and does not know need an [facility] hospital follow-up appointment after discharge.  The patient does have transportation limitations that hinder transportation to clinic appointments.  Signed: Collier Salina, MD 02/27/2016, 5:32 AM

## 2016-02-27 NOTE — Progress Notes (Signed)
SATURATION QUALIFICATIONS: (This note is used to comply with regulatory documentation for home oxygen)  Patient Saturations on Room Air at Rest =88%  Patient Saturations on Room Air while Ambulating =81%  Patient Saturations on 2 Liters of oxygen while Ambulating =92%  Please briefly explain why patient needs home oxygen: Patient recovered only to 84 % on RA at rest. O2 at 2L replaced and patient sats back up to 92 after 74min

## 2016-02-27 NOTE — Care Management Note (Signed)
Case Management Note  Patient Details  Name: [name] [name] MRN: [id number] Date of Birth: 04-Aug-1961  Subjective/Objective:                 Patient in obs from home with husband. Patient states that she drives herself to MD appts, gets meds delivered by [facility], has nebulizer, cane and walker at home. Patient does not have oxygen at home. Patient declined CM assistance at this time.    Action/Plan:  Watch for oxygen needs at DC.  Expected Discharge Date:                  Expected Discharge Plan:  Home/Self Care  In-House Referral:     Discharge planning Services  CM Consult  Post Acute Care Choice:    Choice offered to:     DME Arranged:    DME Agency:     HH Arranged:    HH Agency:     Status of Service:  In process, will continue to follow  Medicare Important Message Given:    Date Medicare IM Given:    Medicare IM give by:    Date Additional Medicare IM Given:    Additional Medicare Important Message give by:     If discussed at [place] of Stay Meetings, dates discussed:    Additional Comments:  Carles Collet, RN 02/27/2016, 1:34 PM

## 2016-02-27 NOTE — Progress Notes (Signed)
Subjective: Patient continues to have generalized abd pain and diffuse joint pain. Still coughing and having SOB. Denies any chest pain. No n/v currently.    Objective: Vital signs in last 24 hours: Filed Vitals:   02/27/16 0729 02/27/16 0730 02/27/16 0829 02/27/16 1313  BP:  105/58 113/70 98/64  Pulse:  87 83 71  Temp: 97.9 F (36.6 C)  97.9 F (36.6 C) 98.1 F (36.7 C)  TempSrc: Oral  Oral Oral  Resp:  24 18 18   SpO2:  92% 93% 91%   Weight change:  No intake or output data in the 24 hours ending 02/27/16 1320 Vitals reviewed. General: obese female, esting in bed, NAD HEENT: PERRL, EOMI, no scleral icterus Cardiac: RRR, no rubs, murmurs or gallops Pulm: coughing with deep respiration, distant breathe sounds, some exp wheezing noted Abd: soft, obese, not significantly distended. Has some generalized tenderness.  Ext: warm and well perfused, 1+ pitting edema b/l.  Neuro: alert and oriented X3  Lab Results: Basic Metabolic Panel:  Recent Labs Lab 02/26/16 2110 02/27/16 0517  NA 131* 131*  K 3.3* 3.8  CL 97* 97*  CO2 24 23  GLUCOSE 116* 250*  BUN 10 10  CREATININE 0.85 0.86  CALCIUM 8.1* 8.2*   Liver Function Tests:  Recent Labs Lab 02/26/16 2110 02/27/16 0517  AST 58* 62*  ALT 29 30  ALKPHOS 111 122  BILITOT 2.9* 2.7*  PROT 6.0* 6.4*  ALBUMIN 2.6* 2.7*    Recent Labs Lab 02/26/16 2110  LIPASE 43   No results for input(s): AMMONIA in the last 168 hours. CBC:  Recent Labs Lab 02/26/16 2110 02/27/16 0517  WBC 10.7* 5.4  HGB 13.7 13.9  HCT 39.6 40.9  MCV 102.1* 102.8*  PLT 87* 85*    Alcohol Level: No results for input(s): ETH in the last 168 hours. Urinalysis:  Recent Labs Lab 02/26/16 2345  COLORURINE YELLOW  LABSPEC 1.008  PHURINE 6.0  GLUCOSEU NEGATIVE  HGBUR TRACE*  BILIRUBINUR NEGATIVE  KETONESUR NEGATIVE  PROTEINUR NEGATIVE  NITRITE NEGATIVE  LEUKOCYTESUR TRACE*   Misc. Labs:  Dg Chest 2 View  02/26/2016  CLINICAL  DATA:  Shortness of breath and wheezing for 2 weeks EXAM: CHEST  2 VIEW COMPARISON:  Mar 05, 2015 FINDINGS: There is no edema or consolidation. Heart is upper normal in size with pulmonary vascularity within normal limits. No adenopathy. No bone lesions. IMPRESSION: No edema or consolidation. Electronically Signed   By: Lowella Grip III M.D.   On: 02/26/2016 21:23   US Abdomen Limited Ruq  02/27/2016  CLINICAL DATA:  Right upper quadrant pain. EXAM: US ABDOMEN LIMITED - RIGHT UPPER QUADRANT COMPARISON:  01/05/2016 FINDINGS: Gallbladder: Multiple stones in the dependent gallbladder. Gallbladder wall thickening along the liver surface. Cannot assess for Murphy's sign due to pain medications. Common bile duct: Diameter: 5.5 mm, normal Liver: Limited visualization due to body habitus and rib shadows. Visualized portions demonstrate normal parenchymal pattern. IMPRESSION: Cholelithiasis with focal gallbladder wall thickening. Changes are nonspecific but could be associated with acute cholecystitis in the appropriate clinical setting. Gallbladder wall thickening is new since previous study. Electronically Signed   By: Lucienne Capers M.D.   On: 02/27/2016 04:27   Medications: I have reviewed the patient's current medications. Scheduled Meds: . busPIRone  15 mg Oral Daily   And  . busPIRone  30 mg Oral QHS  . doxepin  10 mg Oral QHS  . furosemide  20 mg Oral BID  .  ipratropium-albuterol  3 mL Nebulization Once  . ipratropium-albuterol  3 mL Nebulization Q6H  . lactulose  20 g Oral Daily  . LORazepam  1 mg Oral QHS  . nicotine  14 mg Transdermal Daily  . pantoprazole  40 mg Oral Daily  . predniSONE  60 mg Oral Q breakfast  . propranolol  20 mg Oral Daily  . rifaximin  550 mg Oral BID  . traZODone  50 mg Oral QHS   Continuous Infusions:  PRN Meds:.albuterol, traMADol Assessment/Plan: Active Problems:   Chronic pain   Essential hypertension, benign   Chronic pain of right ankle    Thrombocytopenia (H[facility])   Insomnia   Symptomatic cholelithiasis   COPD exacerbation (H[facility])   Hyponatremia   Hypokalemia  COPD exacerbation -having wheezing on exam with some productive cough and increased SOB. Is requiring supplemental oxygen currenlty. Unclear cause but likely had a viral infection which may have caused her n/v.  - cont scheduled duoneb + albuterol prn + prednisone 60mg  for 5 days - check ambulatory pulse ox.   Subacute Abdominal pain - for 1 month. Associated with nausea/vomitting, some association with fatty food intake. abd U/S showed cholethiasis with some focal gall bladder wall thickening which could represent cholecystitis. Has some diffuse abd tenderness, no clear murphy's sign on exam abd pain could be 2/2 to cirrhosis, vs biliary colic, vs cholecystitis. She is not febrile, had mild wbc count of 10.7 which resolved. Other less likely causes include SBP.  - will treat with Ceftriaxone for possible cholecystitis, this would also cover for SBP. - will continue rifaximin which she is taking anyway for her liver cirrhosis - will have her f/up with surgery outpatient to discuss cholecystectomy. Will not do this urgently has patient is having COPD exacerbation currently.   Liver cirrhosis with mild ascites currently on exam and u/s. Has known hx of esophageal varices without any hx of bleeding. Also had hx of encephalopathy -  Cont home lasix 20mg  po BID + spirnolactone 50mg  daily - cont lacutolse and rifaximin  Chronic diffuse joint pain - could be 2/2 to osteo +/- fibromyalgia - cont home tramadol  Anxiety/depression - cont home ativan + trazodone + doxepin.   Dispo: Disposition is deferred at this time, awaiting improvement of current medical problems.  Anticipated discharge in approximately 1-2 day(s).   The patient does have a current PCP Milagros Loll, MD) and does need an [facility] hospital follow-up appointment after discharge.  The patient does have  transportation limitations that hinder transportation to clinic appointments.  .Services Needed at time of discharge: Y = Yes, Blank = No PT:   OT:   RN:   Equipment:   Other:       Dellia Nims, MD 02/27/2016, 1:20 PM

## 2016-02-27 NOTE — Telephone Encounter (Signed)
Pt is in house, she has already spoken to [place]- needed to cancel her ortho referral- have passed this message to [place]

## 2016-02-28 DIAGNOSIS — J441 Chronic obstructive pulmonary disease with (acute) exacerbation: Secondary | ICD-10-CM | POA: Diagnosis not present

## 2016-02-28 DIAGNOSIS — K7460 Unspecified cirrhosis of liver: Secondary | ICD-10-CM

## 2016-02-28 DIAGNOSIS — R062 Wheezing: Secondary | ICD-10-CM | POA: Insufficient documentation

## 2016-02-28 DIAGNOSIS — R109 Unspecified abdominal pain: Secondary | ICD-10-CM

## 2016-02-28 LAB — GLUCOSE, CAPILLARY: GLUCOSE-CAPILLARY: 119 mg/dL — AB (ref 65–99)

## 2016-02-28 MED ORDER — LACTULOSE 10 GM/15ML PO SOLN
20.0000 g | Freq: Three times a day (TID) | ORAL | Status: DC
Start: 2016-02-28 — End: 2016-02-28
  Administered 2016-02-28: 20 g via ORAL
  Filled 2016-02-28: qty 30

## 2016-02-28 MED ORDER — LACTULOSE 10 GM/15ML PO SOLN: 20.0000 g | Freq: Three times a day (TID) | ORAL | Status: DC

## 2016-02-28 MED ORDER — NICOTINE POLACRILEX 2 MG MT LOZG: 2.0000 mg | LOZENGE | OROMUCOSAL | Status: DC | PRN

## 2016-02-28 MED ORDER — IPRATROPIUM-ALBUTEROL 0.5-2.5 (3) MG/3ML IN SOLN
3.0000 mL | Freq: Three times a day (TID) | RESPIRATORY_TRACT | Status: DC
  Administered 2016-02-28 (×2): 3 mL via RESPIRATORY_TRACT
  Filled 2016-02-28 (×2): qty 3

## 2016-02-28 MED ORDER — MOMETASONE FURO-FORMOTEROL FUM 200-5 MCG/ACT IN AERO
2.0000 | INHALATION_SPRAY | Freq: Two times a day (BID) | RESPIRATORY_TRACT | Status: DC
  Administered 2016-02-28: 2 via RESPIRATORY_TRACT
  Filled 2016-02-28: qty 8.8

## 2016-02-28 MED ORDER — NICOTINE 21 MG/24HR TD PT24: 21.0000 mg | MEDICATED_PATCH | Freq: Every day | TRANSDERMAL | Status: DC

## 2016-02-28 MED ORDER — NICOTINE 21 MG/24HR TD PT24
21.0000 mg | MEDICATED_PATCH | Freq: Every day | TRANSDERMAL | Status: DC
Start: 2016-02-28 — End: 2016-02-28
  Administered 2016-02-28: 21 mg via TRANSDERMAL
  Filled 2016-02-28: qty 1

## 2016-02-28 MED ORDER — LEVOFLOXACIN 750 MG PO TABS: 750.0000 mg | ORAL_TABLET | Freq: Every day | ORAL | Status: DC

## 2016-02-28 MED ORDER — PREDNISONE 20 MG PO TABS: 60.0000 mg | ORAL_TABLET | Freq: Every day | ORAL | Status: DC

## 2016-02-28 NOTE — Progress Notes (Signed)
An After Visit Summary was printed and given to the patient. Patient received prescription.  D/c education completed with patient/family including follow up instructions, medication list, d/c activities limitations if indicated, with other d/c instructions as indicated by MD - patient able to verbalize understanding, all questions fully answered.   Patient instructed to return to ED, call 911, or call MD for any changes in condition.   L'ESPERANCE, Laconya Clere C 02/28/2016 5:18 PM

## 2016-02-28 NOTE — Progress Notes (Addendum)
Subjective: Has back pain. States she lives in a small house where there are many smokers. It's hard for her to avoid smoking with other smokers around. Also states her nicotine patch dose is not as strong as she would like. States her breathing is ok now. We went over her copd meds, she states compliance with spiriva and symbicort but using her albuterol neb as scheduled, not PRN. Has chronic back pain and also RUQ abd pain. Afebrile overnight.   Objective: Vital signs in last 24 hours: Filed Vitals:   02/27/16 1502 02/27/16 1934 02/27/16 2201 02/28/16 0558  BP:   103/79 112/61  Pulse:   87 72  Temp:   98.2 F (36.8 C) 97.5 F (36.4 C)  TempSrc:    Oral  Resp:   18 19  Height:      Weight:      SpO2: 92% 90% 94% 90%   Weight change:   Intake/Output Summary (Last 24 hours) at 02/28/16 0836 Last data filed at 02/27/16 1445  Gross per 24 hour  Intake    240 ml  Output    500 ml  Net   -260 ml   Vitals reviewed. General: obese female, standing up. HEENT: PERRL, EOMI, no scleral icterus Cardiac: RRR, no rubs, murmurs or gallops Pulm: coughing with deep respiration,good air movement, mild wheezing. Able to talk in full sentences  Abd: soft, obese, not significantly distended. Has some TTP on RUQ.  Ext: warm and well perfused, 1+ pitting edema b/l.  Neuro: alert and oriented X3  Lab Results: Basic Metabolic Panel:  Recent Labs Lab 02/26/16 2110 02/27/16 0517  NA 131* 131*  K 3.3* 3.8  CL 97* 97*  CO2 24 23  GLUCOSE 116* 250*  BUN 10 10  CREATININE 0.85 0.86  CALCIUM 8.1* 8.2*   Liver Function Tests:  Recent Labs Lab 02/26/16 2110 02/27/16 0517  AST 58* 62*  ALT 29 30  ALKPHOS 111 122  BILITOT 2.9* 2.7*  PROT 6.0* 6.4*  ALBUMIN 2.6* 2.7*    Recent Labs Lab 02/26/16 2110  LIPASE 43   No results for input(s): AMMONIA in the last 168 hours. CBC:  Recent Labs Lab 02/26/16 2110 02/27/16 0517  WBC 10.7* 5.4  HGB 13.7 13.9  HCT 39.6 40.9  MCV  102.1* 102.8*  PLT 87* 85*    Alcohol Level: No results for input(s): ETH in the last 168 hours. Urinalysis:  Recent Labs Lab 02/26/16 2345  COLORURINE YELLOW  LABSPEC 1.008  PHURINE 6.0  GLUCOSEU NEGATIVE  HGBUR TRACE*  BILIRUBINUR NEGATIVE  KETONESUR NEGATIVE  PROTEINUR NEGATIVE  NITRITE NEGATIVE  LEUKOCYTESUR TRACE*   Misc. Labs:  Dg Chest 2 View  02/26/2016  CLINICAL DATA:  Shortness of breath and wheezing for 2 weeks EXAM: CHEST  2 VIEW COMPARISON:  Mar 05, 2015 FINDINGS: There is no edema or consolidation. Heart is upper normal in size with pulmonary vascularity within normal limits. No adenopathy. No bone lesions. IMPRESSION: No edema or consolidation. Electronically Signed   By: Lowella Grip III M.D.   On: 02/26/2016 21:23   US Abdomen Limited Ruq  02/27/2016  CLINICAL DATA:  Right upper quadrant pain. EXAM: US ABDOMEN LIMITED - RIGHT UPPER QUADRANT COMPARISON:  01/05/2016 FINDINGS: Gallbladder: Multiple stones in the dependent gallbladder. Gallbladder wall thickening along the liver surface. Cannot assess for Murphy's sign due to pain medications. Common bile duct: Diameter: 5.5 mm, normal Liver: Limited visualization due to body habitus and rib shadows. Visualized  portions demonstrate normal parenchymal pattern. IMPRESSION: Cholelithiasis with focal gallbladder wall thickening. Changes are nonspecific but could be associated with acute cholecystitis in the appropriate clinical setting. Gallbladder wall thickening is new since previous study. Electronically Signed   By: Lucienne Capers M.D.   On: 02/27/2016 04:27   Medications: I have reviewed the patient's current medications. Scheduled Meds: . busPIRone  15 mg Oral Daily   And  . busPIRone  30 mg Oral QHS  . cefTRIAXone (ROCEPHIN)  IV  1 g Intravenous Q24H  . doxepin  10 mg Oral QHS  . furosemide  20 mg Oral BID  . ipratropium-albuterol  3 mL Nebulization Once  . ipratropium-albuterol  3 mL Nebulization TID  .  lactulose  20 g Oral Daily  . LORazepam  1 mg Oral QHS  . nicotine  21 mg Transdermal Daily  . pantoprazole  40 mg Oral Daily  . predniSONE  60 mg Oral Q breakfast  . propranolol  20 mg Oral Daily  . rifaximin  550 mg Oral BID  . traZODone  50 mg Oral QHS   Continuous Infusions:  PRN Meds:.albuterol, traMADol Assessment/Plan: Active Problems:   Chronic pain   Essential hypertension, benign   Chronic pain of right ankle   Thrombocytopenia (HCC)   Insomnia   Symptomatic cholelithiasis   COPD exacerbation (HCC)   Hyponatremia   Hypokalemia  COPD exacerbation-improving.  Continues to have some cough but on exam her air movement is better, still has some wheezing.  - cont scheduled duoneb + dulera (symbicort oupatient) albuterol prn + prednisone 60mg  for 5 days - desatting with ambulation - will set her up for home oxygen.   Subacute Abdominal pain - for 1 month. Associated with nausea/vomitting, some association with fatty food intake. abd U/S showed cholethiasis with some focal gall bladder wall thickening which could represent cholecystitis.    abd pain could be 2/2 to cirrhosis, vs biliary colic, vs cholecystitis. She is not febrile, had mild wbc count of 10.7 which resolved. Other less likely causes include SBP.  - we started ceftriaxone 5/2 to treat cholecystitis and also cover for SBP. Will switch to Levaquin today for  - will continue rifaximin which she is taking anyway for her liver cirrhosis - will have her f/up with surgery outpatient to discuss cholecystectomy. She showed me a letter showing she has appt at [facility] surgery team 03/18/16 at 1:30 PM. Will not do this urgently has patient is having COPD exacerbation currently.   Liver cirrhosis with mild ascites currently on exam and u/s. Has known hx of esophageal varices without any hx of bleeding. Also had hx of encephalopathy -  Cont home lasix 20mg  po BID + spirnolactone 50mg  daily - cont lacutolse and  rifaximin. Will increase lactulose dose as she did not have any BM since admission.   Chronic diffuse joint pain - could be 2/2 to OA +/- fibromyalgia - cont home tramadol  Anxiety/depression - cont home ativan + trazodone + doxepin.   Tobacco abuse - had long discussion and advised to quit smoking - cont nicotine patch - will prescribe nicotine gum on discharge - asked to notify her family members to not smoke in side her house.   Dispo: Disposition is deferred at this time, awaiting improvement of current medical problems.  Anticipated discharge in approximately 1-2 day(s).   The patient does have a current PCP Milagros Loll, MD) and does need an [name] hospital follow-up appointment after discharge.  The patient does have transportation limitations that hinder transportation to clinic appointments.  .Services Needed at time of discharge: Y = Yes, Blank = No PT:   OT:   RN:   Equipment:   Other:       [name] Nims, MD 02/28/2016, 8:36 AM

## 2016-02-28 NOTE — Care Management Note (Signed)
Case Management Note  Patient Details  Name: [name] [name] MRN: [id number] Date of Birth: September 13, 1961  Subjective/Objective:                 Patient in obs from home with husband. Patient states that she drives herself to MD appts, gets meds delivered by [facility], has nebulizer, cane and walker at home. Patient does not have oxygen at home. Patient declined CM assistance at this time.      Action/Plan:  Referral made for oxygen set up with [facility], and hh RN with [facility] Expected Discharge Date:                  Expected Discharge Plan:  [place]  In-House Referral:     Discharge planning Services  CM Consult  Post Acute Care Choice:  Durable Medical Equipment, Home Health Choice offered to:  Patient  DME Arranged:  Oxygen DME Agency:  [place]:  RN [facility] Agency:  [place]  Status of Service:  Completed, signed off  Medicare Important Message Given:    Date Medicare IM Given:    Medicare IM give by:    Date Additional Medicare IM Given:    Additional Medicare Important Message give by:     If discussed at [place] of Stay Meetings, dates discussed:    Additional Comments:  Carles Collet, RN 02/28/2016, 1:19 PM

## 2016-02-28 NOTE — Discharge Instructions (Addendum)
Please take lactulose three times a day and titrate this for at least 2 bowel movements daily.  Please use nicotine patch and gum to help with smoking cessation.  Take levaquin daily for 7 more days for your gallbladder.  See the surgeon at [place].   Use oxygen especially when ambulating.   Use prednisone for 2 more days. Use your patches.

## 2016-02-28 NOTE — Progress Notes (Signed)
Subjective: Yesterday while at rest her O2 sats were noted to be 88%, and dropped to 81% while ambulating. She was placed on 2L of O2 and her O2 sats while ambulating at that time was around 92%. Overnight she slept well, but woke up this morning around 5am with some pain located in her right upper quadrant that she rated as a 8/10 that comes and goes. She also endorses increased sputum production and cough frequency but denies any chills, fever, diffuse abdominal pain, or chest pain. She has not had a bowel movement since her admission.  Currently getting duoneb treatment with respiratory therapy and states that she is no longer having the shortness of breath that was present during admission. She has been compliant with her COPD medications(Symbicort and Spiriva), and has been counseled on the importance of smoking cessation to avoid worsening her COPD.   Objective: Vital signs in last 24 hours: Filed Vitals:   02/27/16 1934 02/27/16 2201 02/28/16 0558 02/28/16 0835  BP:  103/79 112/61   Pulse:  87 72 80  Temp:  98.2 F (36.8 C) 97.5 F (36.4 C)   TempSrc:   Oral   Resp:  18 19 18   Height:      Weight:      SpO2: 90% 94% 90% 89%   Weight change:   Intake/Output Summary (Last 24 hours) at 02/28/16 1101 Last data filed at 02/27/16 1445  Gross per 24 hour  Intake    240 ml  Output    500 ml  Net   -260 ml   Physical Exam  -Gen: Alert and oriented x3, in no acute distress, mood and response appropriate for conversation.  -Lungs: Cap refill about 2 seconds, expiratory wheezes heard throughout lung fields but heard best in upper lung fields. Mild increase work of breathing with speaking.  -Heart: Regular rhythm, slightly tachycardiac, no murmurs, rubs or gallops heard on ausculation. No peripheral edema noted.  -Abdomen: Abdomen is soft, bowel sounds present. Difficult to elicit Percell Miller sign due to cessation of breathing with any moderate palpation to RUQ during inspiration and  expiration.    Lab Results: No recent labs for 02/28/2016 Micro Results: No results found for this or any previous visit (from the past 240 hour(s)). Studies/Results: Dg Chest 2 View  02/26/2016  CLINICAL DATA:  Shortness of breath and wheezing for 2 weeks EXAM: CHEST  2 VIEW COMPARISON:  Mar 05, 2015 FINDINGS: There is no edema or consolidation. Heart is upper normal in size with pulmonary vascularity within normal limits. No adenopathy. No bone lesions. IMPRESSION: No edema or consolidation. Electronically Signed   By: Lowella Grip III M.D.   On: 02/26/2016 21:23   US Abdomen Limited Ruq  02/27/2016  CLINICAL DATA:  Right upper quadrant pain. EXAM: US ABDOMEN LIMITED - RIGHT UPPER QUADRANT COMPARISON:  01/05/2016 FINDINGS: Gallbladder: Multiple stones in the dependent gallbladder. Gallbladder wall thickening along the liver surface. Cannot assess for Murphy's sign due to pain medications. Common bile duct: Diameter: 5.5 mm, normal Liver: Limited visualization due to body habitus and rib shadows. Visualized portions demonstrate normal parenchymal pattern. IMPRESSION: Cholelithiasis with focal gallbladder wall thickening. Changes are nonspecific but could be associated with acute cholecystitis in the appropriate clinical setting. Gallbladder wall thickening is new since previous study. Electronically Signed   By: Lucienne Capers M.D.   On: 02/27/2016 04:27   Medications: I have reviewed the patient's current medications. Scheduled Meds: . busPIRone  15 mg Oral Daily  And  . busPIRone  30 mg Oral QHS  . cefTRIAXone (ROCEPHIN)  IV  1 g Intravenous Q24H  . doxepin  10 mg Oral QHS  . furosemide  20 mg Oral BID  . ipratropium-albuterol  3 mL Nebulization Once  . ipratropium-albuterol  3 mL Nebulization TID  . lactulose  20 g Oral TID  . LORazepam  1 mg Oral QHS  . mometasone-formoterol  2 puff Inhalation BID  . nicotine  21 mg Transdermal Daily  . pantoprazole  40 mg Oral Daily  .  predniSONE  60 mg Oral Q breakfast  . propranolol  20 mg Oral Daily  . rifaximin  550 mg Oral BID  . traZODone  50 mg Oral QHS   Continuous Infusions:  PRN Meds:.albuterol, traMADol Assessment/Plan: Active Problems:   Chronic pain   Essential hypertension, benign   Chronic pain of right ankle   Thrombocytopenia (HCC)   Insomnia   Symptomatic cholelithiasis   COPD exacerbation (HCC)   Hyponatremia   Hypokalemia   Cirrhosis of liver with ascites (HCC)  COPD exacerbation.  Ms. Schedler does seem to be improving clinically regarding her COPD, on exam she had better air movement in her lungs and decreased expiratory wheezing compared to prior exam, but she still continues to have sputum production and no changes in the frequency in her cough. Since her O2 sats did drop below 88% while ambulating without oxygen, she does qualify for home oxygen at this time. - Cont scheduled duoneb + dulera (symbicort oupatient) albuterol prn + prednisone 60mg  for 5 days, she is on day 3 of 5 today. -We have increased her dose of her nicotine patch today+supply nicotine gum as well and she has been counseled to avoid smoking as well as secondary smoke exposure as well.    Subacute Abdominal pain - has been present for 1 month. Associated with nausea/vomiting, some association with fatty food intake. She has had a few episodes of nausea but no vomiting in the past 24 hours. A recent abd U/S showed cholethiasis with some focal gall bladder wall thickening which is likely consistent w/cholecystitis.  Other things on the differential include: Biliary colic, or abd pain due to cirrhosis. Concern is highest for SBP, but the patient has been afebrile and has not had any changes in mental status/clinical presentation that would make this likely at this time.   -Currently on Rocephin for cholecystitis, and will transition to PO antibiotics. -Patient will f/u outpatient with Dr. Bailey Mech, [facility],  hepatobiliary surgery on Mar 18, 2016 for further evaluation for cholecystectomy.   Liver cirrhosis with mild ascites currently on exam and u/s. Has known hx of esophageal varices without any hx of bleeding. Also had hx of encephalopathy - Cont home lasix 20mg  po BID + spirnolactone 50mg  daily - Cont lacutolse and rifaximin. Will increase lactulose dose until she is having 2-3 bowel movements daily. At this time she has not had a bowel movement since admission  Chronic diffuse joint pain - could be 2/2 to OA +/- fibromyalgia - cont home tramadol  Anxiety/depression - cont home ativan + trazodone + doxepin.   Tobacco abuse - had long discussion and advised to quit smoking - Cont nicotine patch, have increased the strength of the nicotine patch to help with cravings. - will prescribe nicotine gum on discharge - We've told her to notify her family members to not smoke in side her house to prevent worsening of her COPD, as well as  help her maintain smoking cessation.  Dispo: Disposition is deferred at this time, awaiting improvement of current medical problems. Anticipated discharge in approximately 1-2 day(s).   The patient does have a current PCP Milagros Loll, MD) and does need an [facility] hospital follow-up appointment after discharge     This is a Careers information officer Note.  The care of the patient was discussed with Dr. Chesley Mires and the assessment and plan formulated with their assistance.  Please see their attached note for official documentation of the daily encounter.     Delena Serve., Med Student 02/28/2016, 11:01 AM

## 2016-02-29 ENCOUNTER — Encounter: Payer: Self-pay | Admitting: Pulmonary Disease

## 2016-02-29 ENCOUNTER — Telehealth: Payer: Self-pay | Admitting: Pulmonary Disease

## 2016-02-29 NOTE — Telephone Encounter (Signed)
Pt was called, she was acting very odd, she couldn't tell nurse why she had called then she said" my tramadol has been upped"" i have an appointment and its on the 11th" i ask her if that was all she needed and she said yes, call ended

## 2016-02-29 NOTE — Telephone Encounter (Signed)
Needs to talk to nuse

## 2016-03-01 NOTE — Discharge Summary (Signed)
Name: [name] [name] MRN: [id number] DOB: [birth year] 55 y.o. PCP: Milagros Loll, MD  Date of Admission: 02/26/2016  8:25 PM Date of Discharge: 03/01/2016 Attending Physician: No att. providers found  Discharge Diagnosis:  Active Problems:   Chronic pain   Essential hypertension, benign   Chronic pain of right ankle   Thrombocytopenia (HCC)   Insomnia   Symptomatic cholelithiasis   COPD exacerbation (HCC)   Hyponatremia   Hypokalemia   Cirrhosis of liver with ascites ([place])   Wheezing  Discharge Medications:   Medication List    TAKE these medications        budesonide-formoterol 160-4.5 MCG/ACT inhaler  Commonly known as:  SYMBICORT  Inhale 2 puffs into the lungs 2 (two) times daily.     busPIRone 15 MG tablet  Commonly known as:  BUSPAR  1 bid for  1 week then 1 qam  2  qhs  Notes to Patient:  Take one tablet twice a day for one week  Then take one tablet in the am and two tablets at night     clonazePAM 1 MG tablet  Commonly known as:  KLONOPIN  Take 1 mg by mouth 3 (three) times daily as needed for anxiety.     diclofenac sodium 1 % Gel  Commonly known as:  VOLTAREN  Apply 4 g topically 4 (four) times daily.     doxepin 10 MG capsule  Commonly known as:  SINEQUAN  1  qhs     fluticasone 50 MCG/ACT nasal spray  Commonly known as:  FLONASE  PLACE 1 SPRAY INTO BOTH NOSTRILS DAILY.     furosemide 20 MG tablet  Commonly known as:  LASIX  TAKE 1 TABLET (20 MG TOTAL) BY MOUTH TWO   TIMES DAILY.     hydrOXYzine 50 MG tablet  Commonly known as:  ATARAX/VISTARIL  1 qhs may repeat  Notes to Patient:  One tablet at bedtime     lactulose 10 GM/15ML solution  Commonly known as:  CHRONULAC  Take 30 mLs (20 g total) by mouth 3 (three) times daily. Take 30 ml (20 grams) by mouth daily  Notes to Patient:  Take so you are having 2-3 bowel movements a day     levofloxacin 750 MG tablet  Commonly known as:  LEVAQUIN  Take 1 tablet (750 mg total) by mouth daily.       LORazepam 1 MG tablet  Commonly known as:  ATIVAN  1 qhs  Notes to Patient:  Take on at bedtime     Melatonin 1 MG Caps  TAKE 1 CAPSULE BY MOUTH AT BEDTIME.     nicotine 21 mg/24hr patch  Commonly known as:  NICODERM CQ - dosed in mg/24 hours  Place 1 patch (21 mg total) onto the skin daily.     nicotine polacrilex 2 MG lozenge  Commonly known as:  [employer] NICOTINE  Take 1 lozenge (2 mg total) by mouth as needed for smoking cessation.     pantoprazole 40 MG tablet  Commonly known as:  PROTONIX  Take 1 tablet (40 mg total) by mouth daily.     predniSONE 20 MG tablet  Commonly known as:  DELTASONE  Take 3 tablets (60 mg total) by mouth daily with breakfast.     PROAIR HFA 108 (90 Base) MCG/ACT inhaler  Generic drug:  albuterol  INHALE TWO PUFFS INTO THE LUNGS EVERY SIX HOURS AS NEEDED FOR WHEEZING     propranolol 20 MG tablet  Commonly known as:  INDERAL  TAKE 1 TABLET (20 MG TOTAL) BY MOUTH DAILY.     SPIRIVA HANDIHALER 18 MCG inhalation capsule  Generic drug:  tiotropium  PLACE 1 CAPSULE (18 MCG TOTAL) INTO INHALER AND INHALE DAILY.     spironolactone 50 MG tablet  Commonly known as:  ALDACTONE  Take 1 tablet (50 mg total) by mouth daily.     traMADol 50 MG tablet  Commonly known as:  ULTRAM  TAKE 1 TABLET BY MOUTH EVERY 6 HOUR PRN PAIN **DO NOT FILL BEFORE 02/10/16 PER MD**     traZODone 50 MG tablet  Commonly known as:  DESYREL  Take 1 tablet (50 mg total) by mouth at bedtime.     XIFAXAN 550 MG Tabs tablet  Generic drug:  rifaximin  TAKE 1 TABLET (550 MG TOTAL) BY MOUTH TWO   (TWO) TIMES DAILY.        Disposition and follow-up:   [name] was discharged from [facility] in [place] condition.  At the hospital follow up visit please address:  1.  Please assess her respiratory status, whether she is feeling better from her COPD exacerbation.  Please assist her with surgical follow up for cholecystectomy. Gave her levaquin + she is  already on rifaximin to treat her cholecystitis for 10 day total.  She has appt at [facility] already.  Help her further with smoking cessation.  Please make sure she is having at least 2 Bowel movements daily with her lactulose.   2.  Labs / imaging needed at time of follow-up:   3.  Pending labs/ test needing follow-up:   Follow-up Appointments:     Follow-up Information    Follow up with [facility].   Why:  Home health nurse will call you in next 1-2 days to set up first home visit   Contact information:   [address] [place] [address] [phone]       Follow up with [facility].   Why:  Oxygen to be delivered to room prior to discharge   Contact information:   [address] [place] [address] [phone]       Follow up with Jacques Earthly, MD. Schedule an appointment as soon as possible for a visit in 1 week.   Specialty:  Internal Medicine   Why:  For hosptial followup   Contact information:   [place] [address] [phone]       Discharge Instructions: Discharge Instructions    Diet - low sodium heart healthy    Complete by:  As directed      Increase activity slowly    Complete by:  As directed            Consultations:    Procedures Performed:  Dg Chest 2 View  02/26/2016  CLINICAL DATA:  Shortness of breath and wheezing for 2 weeks EXAM: CHEST  2 VIEW COMPARISON:  Mar 05, 2015 FINDINGS: There is no edema or consolidation. Heart is upper normal in size with pulmonary vascularity within normal limits. No adenopathy. No bone lesions. IMPRESSION: No edema or consolidation. Electronically Signed   By: Lowella Grip III M.D.   On: 02/26/2016 21:23   US Abdomen Limited Ruq  02/27/2016  CLINICAL DATA:  Right upper quadrant pain. EXAM: US ABDOMEN LIMITED - RIGHT UPPER QUADRANT COMPARISON:  01/05/2016 FINDINGS: Gallbladder: Multiple stones in the dependent gallbladder.  Gallbladder  wall thickening along the liver surface. Cannot assess for Murphy's sign due to pain medications. Common bile duct: Diameter: 5.5 mm, normal Liver: Limited visualization due to body habitus and rib shadows. Visualized portions demonstrate normal parenchymal pattern. IMPRESSION: Cholelithiasis with focal gallbladder wall thickening. Changes are nonspecific but could be associated with acute cholecystitis in the appropriate clinical setting. Gallbladder wall thickening is new since previous study. Electronically Signed   By: Lucienne Capers M.D.   On: 02/27/2016 04:27    Admission HPI:   Patient is a 55 y/o woman with past medical history significant for liver cirrhosis former alcohol and hepatitis C with esophageal varices and portal hypertension, COPD, gastritis, osteoarthritis, anxiety and depression presenting to the emergency department for abdominal pain. Her pain is ongoing for about the past month with slightly increasing severity. This abdominal pain is diffuse. The pain occurs continuously every day but is intermittently exacerbated when eating particularly fatty or spicy foods. The pain does not radiate into her chest or back although she does have chronic back pain which is separate. Several days ago her symptoms worsened and developed watery diarrhea at least 3 times daily. Since yesterday there is also increased nausea with her pain and has not been able to tolerate much solid food without vomiting. She reports multiple episodes of nonbloody nonbilious vomiting yesterday that are improved today. She reports some subjective chills throughout this time. She also complains of substantial diffuse pain in the sides, back, hands, and feet attributed to her arthritis that are mostly at or near their baseline severity. Her home medications for these are tramadol 50mg  q6hrs and voltaren gel but feels her pain is not controlled at all. She also has noticed some increase in cough with sputum  production over this past month. This cough produces clear sputum exclusively. She denies significant dyspnea or pain associated with her coughing. She describes appropriate use of her spiriva and symbicort inhalers every day at home plus albuterol as needed but complains they do nothing for her symptoms. After arrival in the ED she is noted to become mildly hypoxic into the 80%s SpO2 on room air. She has never required supplemental oxygen at home. Oxygenation improved above 90% with increased FiO2 by nasal cannula.  Hospital Course by problem list: Active Problems:   Chronic pain   Essential hypertension, benign   Chronic pain of right ankle   Thrombocytopenia (HCC)   Insomnia   Symptomatic cholelithiasis   COPD exacerbation (HCC)   Hyponatremia   Hypokalemia   Cirrhosis of liver with ascites (HCC)   Wheezing   1. COPD exacerbation Ms. Holen was admitted to the internal medicine teaching service at [facility] on Feb 27, 2016 with increased cough, clear sputum production and hypoxia of 88% without any other major issues. The patient is compliant with her home COPD medications Symbicort and Spiriva. She did have increased work of breathing noted especially during conversation as well as wheezing on exam. Chest x-ray was unremarkable on admission. She was started on duonebs q6hrs, albuterol nebulizer q2hrs PRN, supp oxygen and a 5 day course of prednisone 60mg  daily. During her hospitalization while ambulating on pulse oximetry her O2 saturation dropped to 81% on room air, as a result she will be discharged with home oxygen and she has been counseled on the importance of smoking cessation to prevent progression of her COPD, as well as avoidance of secondary smoke exposure. We gave her nicotine patch and gums. This should be further followed up outpatient.  2. Cirrhosis of the liver Patient has a MELD score of 15, with some ascites present. She has a history of known esophageal varices but no  history of bleed and is on daily propranolol. We continued the patients' home lasix 20mg  PO BID and spironolactone 50mg  daily, as well lactulose 20mg  once a day and rifaxamin 550mg  BID. Prior to discharge we titrated her dose of lactulose up due to the lack of bowel movements during her hospital stay.  3. Subacute abdominal pain She has had abdominal pain present for about a month with some associated nausea and vomiting that is related to intake of fatty food. There was an abdominal U/S done that showed cholelithasis with some focal gallbladder thickening making cholecystitis very likely. On 02/27/2016, we started ceftriaxone to treat her cholecystitis with the plan for her to follow up with Dr. Carlis Abbott with [facility], Hepatobiliary surgery on 03/18/2016. She will be discharged with levofloxacin 750mg  daily for her cholecystitis for total 10 day course.     Discharge Vitals:   BP 108/63 mmHg  Pulse 75  Temp(Src) 97.8 F (36.6 C) (Oral)  Resp 16  Ht 5\' 3"  (1.6 m)  Wt 214 lb 14.4 oz (97.478 kg)  BMI 38.08 kg/m2  SpO2 91%  Discharge Labs:  Lab Results  Component Value Date   WBC 5.4 02/27/2016   HGB 13.9 02/27/2016   HCT 40.9 02/27/2016   MCV 102.8* 02/27/2016   PLT 85* 02/27/2016   BMP Latest Ref Rng 02/27/2016 02/26/2016 11/23/2015  Glucose 65 - 99 mg/dL 250(H) 116(H) 77  BUN 6 - 20 mg/dL 10 10 11   Creatinine 0.44 - 1.00 mg/dL 0.86 0.85 0.66  BUN/Creat Ratio 9 - 23 - - 17  Sodium 135 - 145 mmol/L 131(L) 131(L) 139  Potassium 3.5 - 5.1 mmol/L 3.8 3.3(L) 4.5  Chloride 101 - 111 mmol/L 97(L) 97(L) 103  CO2 22 - 32 mmol/L 23 24 21   Calcium 8.9 - 10.3 mg/dL 8.2(L) 8.1(L) 8.9     Signed: Dellia Nims, MD 03/01/2016, 11:05 AM    Services Ordered on Discharge:  Equipment Ordered on Discharge:

## 2016-03-02 ENCOUNTER — Other Ambulatory Visit: Payer: Self-pay | Admitting: Pulmonary Disease

## 2016-03-04 NOTE — Progress Notes (Signed)
[facility] called in regarding prednisone. Patient only received one dose of 60mg  instead of two. MD Ahmed notified and said it was fine as long as she was doing well. Joslyn Hy, MSN, RN, [facility]'

## 2016-03-04 NOTE — Telephone Encounter (Signed)
Few days early for refill. Next appointment 05/16/2016.

## 2016-03-05 ENCOUNTER — Telehealth: Payer: Self-pay | Admitting: *Deleted

## 2016-03-05 NOTE — Telephone Encounter (Signed)
2x week for 2 weeks, teaching of disease management and medication teaching, do you approve, verbal ok given

## 2016-03-06 ENCOUNTER — Telehealth: Payer: Self-pay | Admitting: Pulmonary Disease

## 2016-03-06 NOTE — Telephone Encounter (Signed)
A. REMINDER CALL, LMTCB °

## 2016-03-06 NOTE — Telephone Encounter (Signed)
Ok with me. Thanks.  

## 2016-03-07 ENCOUNTER — Encounter: Payer: Self-pay | Admitting: Pulmonary Disease

## 2016-03-07 ENCOUNTER — Ambulatory Visit (INDEPENDENT_AMBULATORY_CARE_PROVIDER_SITE_OTHER): Payer: Medicaid Other | Admitting: Pulmonary Disease

## 2016-03-07 VITALS — BP 125/71 | HR 63 | Temp 98.0°F | Ht 63.0 in | Wt 217.2 lb

## 2016-03-07 DIAGNOSIS — K7682 Hepatic encephalopathy: Secondary | ICD-10-CM

## 2016-03-07 DIAGNOSIS — Z79899 Other long term (current) drug therapy: Secondary | ICD-10-CM | POA: Diagnosis not present

## 2016-03-07 DIAGNOSIS — K7290 Hepatic failure, unspecified without coma: Secondary | ICD-10-CM | POA: Diagnosis not present

## 2016-03-07 DIAGNOSIS — Z7951 Long term (current) use of inhaled steroids: Secondary | ICD-10-CM | POA: Diagnosis not present

## 2016-03-07 DIAGNOSIS — M25571 Pain in right ankle and joints of right foot: Secondary | ICD-10-CM | POA: Diagnosis not present

## 2016-03-07 DIAGNOSIS — G8929 Other chronic pain: Secondary | ICD-10-CM | POA: Diagnosis not present

## 2016-03-07 DIAGNOSIS — J449 Chronic obstructive pulmonary disease, unspecified: Secondary | ICD-10-CM | POA: Diagnosis present

## 2016-03-07 MED ORDER — MOMETASONE FURO-FORMOTEROL FUM 200-5 MCG/ACT IN AERO: 2.0000 | INHALATION_SPRAY | Freq: Two times a day (BID) | RESPIRATORY_TRACT | Status: DC

## 2016-03-07 MED ORDER — MELOXICAM 7.5 MG PO TABS: 7.5000 mg | ORAL_TABLET | Freq: Every day | ORAL | Status: DC

## 2016-03-07 MED ORDER — ALBUTEROL SULFATE (2.5 MG/3ML) 0.083% IN NEBU: 2.5000 mg | INHALATION_SOLUTION | Freq: Four times a day (QID) | RESPIRATORY_TRACT | Status: DC | PRN

## 2016-03-07 MED ORDER — LACTULOSE 10 GM/15ML PO SOLN: 20.0000 g | Freq: Three times a day (TID) | ORAL | Status: DC

## 2016-03-07 NOTE — Assessment & Plan Note (Signed)
History of gastritis with ibuprofen but this has helped with her pain in the past. She is willing to try Mobic daily to help with her pain.  Start Mobic 7.5mg  daily Continue tramadol for now. Will have to see what orthopedics recommendations are. Eventually will start downtitrating her tramadol.

## 2016-03-07 NOTE — Progress Notes (Signed)
Subjective:    [name], female    DOB: 1961-02-05, 55 y.o.   MRN: [id number]  HPI [name] is a 55 year old woman with history of hypertension, hepatitis C, alcohol cirrhosis, COPD, GERD, depression/anxiety presenting for follow up of her hospitalization for COPD exacerbation.  She was prescribed prednisone for her COPD exacerbation. She completed this. She was also prescribed Levaquin for cholecystitis and completed this as well. She reports her breathing has improved. Her coughing has decreased.  Has an appointment with General Surgery at [facility] 03/18/2016.  Review of Systems Constitutional: no fevers/chills Eyes: no vision changes Cardiovascular: no chest pain Genitourinary: no dysuria, no hematuria Integument: no rash  Past Medical History  Diagnosis Date  . Alcoholic cirrhosis ([place])   . COPD (chronic obstructive pulmonary disease) ([place])   . Heart palpitations   . Anxiety   . Portal hypertension ([place])   . Esophageal varices ([place])   . Cholelithiasis   . Splenomegaly   . Fatty liver   . Esophagitis 2010  . Tubulovillous adenoma of colon   . Visual hallucination     since 09/2010/notes 10/30/2012  . Hepatitis C     chronic hepatitis C and Steatohepatitis (hep grade 2, stage 2-3) per 05/13/08 liver biopsy  . Obesity   . cervical Cancer 1999  . Depression   . Shortness of breath   . Allergy   . Arthritis   . GERD (gastroesophageal reflux disease)   . Hypertension   . Gastritis   . Decompensated hepatic cirrhosis ([place]) 03/07/2015    Side effect of Vikera  . Adverse drug effect - [facility] 03/07/2015    Current Outpatient Prescriptions on File Prior to Visit  Medication Sig Dispense Refill  . budesonide-formoterol (SYMBICORT) 160-4.5 MCG/ACT inhaler Inhale 2 puffs into the lungs 2 (two) times daily. 1 Inhaler 2  . busPIRone (BUSPAR) 15 MG tablet 1 bid for  1 week then 1 qam  2  qhs (Patient taking differently: 15-30 mg 2 (two) times daily.  Takes 1 qam and 2  qhs) 90 tablet 5  . clonazePAM (KLONOPIN) 1 MG tablet Take 1 mg by mouth 3 (three) times daily as needed for anxiety.     . diclofenac sodium (VOLTAREN) 1 % GEL Apply 4 g topically 4 (four) times daily. 100 g 3  . doxepin (SINEQUAN) 10 MG capsule 1  qhs (Patient taking differently: Take 10 mg by mouth at bedtime. 1  qhs) 30 capsule 6  . fluticasone (FLONASE) 50 MCG/ACT nasal spray PLACE 1 SPRAY INTO BOTH NOSTRILS DAILY. 16 g 5  . furosemide (LASIX) 20 MG tablet TAKE 1 TABLET (20 MG TOTAL) BY MOUTH TWO   TIMES DAILY. 60 tablet 5  . hydrOXYzine (ATARAX/VISTARIL) 50 MG tablet 1 qhs may repeat 60 tablet 3  . lactulose (CHRONULAC) 10 GM/15ML solution Take 30 mLs (20 g total) by mouth 3 (three) times daily. Take 30 ml (20 grams) by mouth daily 1350 mL 1  . levofloxacin (LEVAQUIN) 750 MG tablet Take 1 tablet (750 mg total) by mouth daily. 7 tablet 0  . LORazepam (ATIVAN) 1 MG tablet 1 qhs (Patient taking differently: Take 1 mg by mouth at bedtime. 1 qhs) 30 tablet 5  . Melatonin 1 MG CAPS TAKE 1 CAPSULE BY MOUTH AT BEDTIME. 30 capsule 5  . nicotine (NICODERM CQ - DOSED IN MG/24 HOURS) 21 mg/24hr patch Place 1 patch (21 mg total) onto the skin daily. 28 patch 0  .  nicotine polacrilex ([employer] NICOTINE) 2 MG lozenge Take 1 lozenge (2 mg total) by mouth as needed for smoking cessation. 100 tablet 0  . pantoprazole (PROTONIX) 40 MG tablet Take 1 tablet (40 mg total) by mouth daily. 90 tablet 1  . predniSONE (DELTASONE) 20 MG tablet Take 3 tablets (60 mg total) by mouth daily with breakfast. 2 tablet 0  . PROAIR HFA 108 (90 Base) MCG/ACT inhaler INHALE TWO PUFFS INTO THE LUNGS EVERY SIX HOURS AS NEEDED FOR WHEEZING 8 g 2  . propranolol (INDERAL) 20 MG tablet TAKE 1 TABLET (20 MG TOTAL) BY MOUTH DAILY. 90 tablet 3  . SPIRIVA HANDIHALER 18 MCG inhalation capsule PLACE 1 CAPSULE (18 MCG TOTAL) INTO INHALER AND INHALE DAILY. 30 capsule 3  . spironolactone (ALDACTONE) 50 MG tablet Take 1 tablet (50  mg total) by mouth daily. 30 tablet 1  . traMADol (ULTRAM) 50 MG tablet Take 1 tablet (50 mg total) by mouth every 6 (six) hours as needed for severe pain. May fill after 03/11/2016. 120 tablet 0  . traZODone (DESYREL) 50 MG tablet Take 1 tablet (50 mg total) by mouth at bedtime. 30 tablet 6  . XIFAXAN 550 MG TABS tablet TAKE 1 TABLET (550 MG TOTAL) BY MOUTH TWO   (TWO) TIMES DAILY. 60 tablet 5   No current facility-administered medications on file prior to visit.      Objective:   Physical Exam Blood pressure 125/71, pulse 63, temperature 98 F (36.7 C), temperature source Oral, height 5\' 3"  (1.6 m), weight 217 lb 3.2 oz (98.521 kg), SpO2 97 %. General Apperance: NAD HEENT: Normocephalic, atraumatic, anicteric sclera Neck: Supple, trachea midline Lungs: Clear to auscultation bilaterally. Few wheezes. No rhonchi or rales. Breathing comfortably on room air Heart: Regular rate and rhythm, no murmur/rub/gallop Abdomen: Soft, nondistended. Extremities: Warm and well perfused, no edema Skin: No rashes or lesions Neurologic: Alert and interactive. No gross deficits.    Assessment & Plan:  Please refer to problem based charting.

## 2016-03-07 NOTE — Assessment & Plan Note (Signed)
Assessment: Controlled on lactulose and rifaximin. She is having 3 BM per day.   Plan: Refilled Lactulose.

## 2016-03-07 NOTE — Assessment & Plan Note (Signed)
Assessment: She was placed on Dulera 200-66mcg twice a day during her hospitalization and still has the inhaler. She has been using it at home in addition to her Symbicort. COPD seems to be better controlled but still uses albuterol daily.  Plan: -Continue on Dulera 200-34mcg BID -Continue Spiriva daily -Discontinue Symbicort -Refill of albuterol neb. -Follow up in 3 months

## 2016-03-07 NOTE — Telephone Encounter (Signed)
Called tramadol into [facility]

## 2016-03-07 NOTE — Patient Instructions (Addendum)
Keep taking your inhalers Make sure you follow up with your surgeons Take Mobic daily for your arthritis Follow up in 3-6 months.

## 2016-03-11 ENCOUNTER — Telehealth: Payer: Self-pay

## 2016-03-11 NOTE — Telephone Encounter (Signed)
[facility] RN called to verify correct inhalers that patient should currently be taking. Verified per note that she should currently have Dulera, Symbicort, and albuterol. Spririva d/c'd. Patient also still had Combivent that was d/c'd in January. Madaline Savage [facility], RN reported that she would call the patient and clarify correct medications.

## 2016-03-11 NOTE — Telephone Encounter (Signed)
Please call back Baker Janus from [facility].

## 2016-03-12 NOTE — Progress Notes (Signed)
Internal Medicine Clinic Attending  Case discussed with Dr. Krall at the time of the visit.  We reviewed the resident's history and exam and pertinent patient test results.  I agree with the assessment, diagnosis, and plan of care documented in the resident's note.  

## 2016-03-13 ENCOUNTER — Other Ambulatory Visit: Payer: Self-pay | Admitting: Pulmonary Disease

## 2016-03-15 ENCOUNTER — Telehealth: Payer: Self-pay

## 2016-03-15 NOTE — Telephone Encounter (Signed)
Pt has missed numerous HH appts and agreed by ph to be at home today, when nurse arrived she called pt and pt stated she was not going to come home. She is now dismissed from [facility] services

## 2016-03-15 NOTE — Telephone Encounter (Signed)
Please call back [name] [name] from [facility].

## 2016-03-16 ENCOUNTER — Other Ambulatory Visit: Payer: Self-pay | Admitting: Pulmonary Disease

## 2016-03-21 ENCOUNTER — Ambulatory Visit (INDEPENDENT_AMBULATORY_CARE_PROVIDER_SITE_OTHER): Payer: Medicaid Other | Admitting: Psychiatry

## 2016-03-21 ENCOUNTER — Ambulatory Visit (HOSPITAL_COMMUNITY): Payer: Self-pay | Admitting: Psychiatry

## 2016-03-21 VITALS — BP 132/86 | HR 75 | Ht 64.0 in | Wt 219.0 lb

## 2016-03-21 DIAGNOSIS — F331 Major depressive disorder, recurrent, moderate: Secondary | ICD-10-CM

## 2016-03-21 DIAGNOSIS — F3342 Major depressive disorder, recurrent, in full remission: Secondary | ICD-10-CM

## 2016-03-21 DIAGNOSIS — F4322 Adjustment disorder with anxiety: Secondary | ICD-10-CM | POA: Diagnosis not present

## 2016-03-21 MED ORDER — DOXEPIN HCL 25 MG PO CAPS: ORAL_CAPSULE | ORAL | Status: DC

## 2016-03-21 MED ORDER — LORAZEPAM 1 MG PO TABS: ORAL_TABLET | ORAL | Status: DC

## 2016-03-21 MED ORDER — BUSPIRONE HCL 15 MG PO TABS: ORAL_TABLET | ORAL | Status: DC

## 2016-03-21 MED ORDER — TRAZODONE HCL 50 MG PO TABS
50.0000 mg | ORAL_TABLET | Freq: Every day | ORAL | Status: DC
Start: 2016-03-21 — End: 2016-09-11

## 2016-03-21 MED ORDER — HYDROXYZINE HCL 25 MG PO TABS: ORAL_TABLET | ORAL | Status: DC

## 2016-03-21 NOTE — Progress Notes (Signed)
[name], female   DOB: Sep 19, 1961, 55 y.o.   MRN: [id number] [facility] MD Progress Note  03/21/2016 2:02 PM [name] [name]  MRN:  [id number] Subjective: Doing fairly well Principal Problem: Adjustment disorder with anxious mood Diagnosis:  Adjustment disorder with anxious mood Today the patient was seen one time. Since I've seen her she's had a medical hospitalization. She's had significant abdominal problems and apparently has gallstones. The surgeons however hesitant to touch her. They say she has significant liver disease. I do not think she is a surgical candidate. She also has chronic swelling in her legs. Is evidence of any symptoms probably most related to her cirrhosis. The patient believes she's had a hepatitis C but is told it is better. She probably doesn't have a viral load anymore. The patient denies daily depression. She is very upset and distressed that she so physically limited. She's not persistently depressed in fact if she felt better physically she go out and do things that she sets. She likes music and she likes being active is physically limited. At this time she has a visiting nurse comes to her home to help her. The patient denies use of alcohol or drugs. She denies persistent anxiety. In the hospital for reasons that are not clear for 1 mg of Ativan was changed to 3 mg of Klonopin. She was also begun on doxepin 10 mg. The patient says that she sleeps fairly well the beginning of the night she wakes up at 2 and cannot go back to sleep. She does have daily dysfunction and that she feels sleepy and drowsy all day because she sleep deprived. The patient is not suicidal. She demonstrates no evidence of psychosis. She is chronic shortness of breath from COPD. Noted is she was discharged on a prednisone taper. I believe this patient has multiple medical problems relating to her liver and to lung disease. The patient however continues to drive without problems and he tries to stay  active as much as she can. Total Time spent with patient: 30 minutes  Past Psychiatric History:   Past Medical History:  Past Medical History  Diagnosis Date  . Alcoholic cirrhosis ([place])   . COPD (chronic obstructive pulmonary disease) ([place])   . Heart palpitations   . Anxiety   . Portal hypertension ([place])   . Esophageal varices ([place])   . Cholelithiasis   . Splenomegaly   . Fatty liver   . Esophagitis 2010  . Tubulovillous adenoma of colon   . Visual hallucination     since 09/2010/notes 10/30/2012  . Hepatitis C     chronic hepatitis C and Steatohepatitis (hep grade 2, stage 2-3) per 05/13/08 liver biopsy  . Obesity   . cervical Cancer 1999  . Depression   . Shortness of breath   . Allergy   . Arthritis   . GERD (gastroesophageal reflux disease)   . Hypertension   . Gastritis   . Decompensated hepatic cirrhosis ([place]) 03/07/2015    Side effect of Vikera  . Adverse drug effect - [facility] 03/07/2015    Past Surgical History  Procedure Laterality Date  . Cervical cancer surgery    . Abdominal hysterectomy  1999  . Tubal ligation  1982  . Esophagogastroduodenoscopy  10/18/2011    Procedure: ESOPHAGOGASTRODUODENOSCOPY (EGD);  Surgeon: Owens Loffler, MD;  Location: [facility];  Service: Endoscopy;  Laterality: N/A;   Family History:  Family History  Problem Relation Age of Onset  . Cervical cancer Sister   .  Breast cancer Maternal Aunt   . Heart disease Father 5    died from MI  . Breast cancer Father   . Cervical cancer Daughter   . Colon cancer Paternal Aunt 31   Family Psychiatric  History:  Social History:  History  Alcohol Use No     History  Drug Use No    Social History   Social History  . Marital Status: Legally Separated    Spouse Name: N/A  . Number of Children: N/A  . Years of Education: N/A   Social History Main Topics  . Smoking status: Current Every Day Smoker -- 0.10 packs/day for 40 years    Types: Cigarettes    Last Attempt to Quit:  03/28/2012  . Smokeless tobacco: Never Used  . Alcohol Use: No  . Drug Use: No  . Sexual Activity: Not on file   Other Topics Concern  . Not on file   Social History Narrative   Additional Social History:                         Sleep: Good  Appetite:  Good  Current Medications: Current Outpatient Prescriptions  Medication Sig Dispense Refill  . albuterol (PROVENTIL) (2.5 MG/3ML) 0.083% nebulizer solution Take 3 mLs (2.5 mg total) by nebulization every 6 (six) hours as needed for wheezing or shortness of breath. 75 mL 2  . busPIRone (BUSPAR) 15 MG tablet 1 qam  2  qpm 90 tablet 5  . doxepin (SINEQUAN) 25 MG capsule 1  qhs 30 capsule 5  . fluticasone (FLONASE) 50 MCG/ACT nasal spray PLACE 1 SPRAY INTO BOTH NOSTRILS DAILY. 16 g 5  . furosemide (LASIX) 20 MG tablet TAKE 1 TABLET (20 MG TOTAL) BY MOUTH TWO   TIMES DAILY. 60 tablet 5  . hydrOXYzine (ATARAX/VISTARIL) 25 MG tablet 1  tid 90 tablet 5  . lactulose (CHRONULAC) 10 GM/15ML solution Take 30 mLs (20 g total) by mouth 3 (three) times daily. 1350 mL 1  . LORazepam (ATIVAN) 1 MG tablet 1 bid 60 tablet 2  . Melatonin 1 MG CAPS TAKE 1 CAPSULE BY MOUTH AT BEDTIME. 30 capsule 5  . meloxicam (MOBIC) 7.5 MG tablet Take 1 tablet (7.5 mg total) by mouth daily. 30 tablet 1  . mometasone-formoterol (DULERA) 200-5 MCG/ACT AERO Inhale 2 puffs into the lungs 2 (two) times daily. 13 g 1  . nicotine (NICODERM CQ - DOSED IN MG/24 HOURS) 21 mg/24hr patch Place 1 patch (21 mg total) onto the skin daily. 28 patch 0  . nicotine polacrilex ([facility] NICOTINE) 2 MG lozenge Take 1 lozenge (2 mg total) by mouth as needed for smoking cessation. 100 tablet 0  . pantoprazole (PROTONIX) 40 MG tablet Take 1 tablet (40 mg total) by mouth daily. 90 tablet 1  . PROAIR HFA 108 (90 Base) MCG/ACT inhaler INHALE TWO PUFFS INTO THE LUNGS EVERY SIX HOURS AS NEEDED FOR WHEEZING 8 g 2  . propranolol (INDERAL) 20 MG tablet TAKE 1 TABLET (20 MG TOTAL) BY MOUTH DAILY.  90 tablet 3  . SPIRIVA HANDIHALER 18 MCG inhalation capsule PLACE 1 CAPSULE (18 MCG TOTAL) INTO INHALER AND INHALE DAILY. 30 capsule 3  . spironolactone (ALDACTONE) 50 MG tablet Take 1 tablet (50 mg total) by mouth daily. 30 tablet 1  . traMADol (ULTRAM) 50 MG tablet Take 1 tablet (50 mg total) by mouth every 6 (six) hours as needed for severe pain. May fill after 03/11/2016. 120  tablet 0  . traZODone (DESYREL) 50 MG tablet Take 1 tablet (50 mg total) by mouth at bedtime. 30 tablet 6  . XIFAXAN 550 MG TABS tablet TAKE 1 TABLET (550 MG TOTAL) BY MOUTH TWO   (TWO) TIMES DAILY. 60 tablet 5   No current facility-administered medications for this visit.    Lab Results: No results found for this or any previous visit (from the past 48 hour(s)).  Physical Findings: AIMS:  , ,  ,  ,    CIWA:    COWS:     Musculoskeletal: Strength & Muscle Tone: within normal limits Gait & Station: normal Patient leans: Right  Psychiatric Specialty Exam: ROS  Blood pressure 132/86, pulse 75, height 5\' 4"  (1.626 m), weight 219 lb (99.338 kg).Body mass index is 37.57 kg/(m^2).  General Appearance: Casual  Eye Contact::  Good  Speech:  Clear and Coherent  Volume:  Normal  Mood:  Euthymic  Affect:  NA  Thought Process:  Coherent  Orientation:  Full (Time, Place, and Person)  Thought Content:  WDL  Suicidal Thoughts:  No  Homicidal Thoughts:  None   Memory:  NA  Judgement:  Good  Insight:  Fair  Psychomotor Activity:  Normal  Concentration:  Good  Recall:  Good  Fund of Knowledge:Good  Language: Good  Akathisia:  No  Handed:  Right  AIMS (if indicated):     Assets:  Desire for Improvement  ADL's:  Intact  Cognition: WNL  Sleep:      Treatment Plan Summary: At this time her #1 problem seems to be anxiety. Nonetheless we will discontinue her Klonopin 3 mg a day and begin her on Ativan 1 mg twice a day. We will give her Hydroxacen giving her dose of 25 mg 3 times a day for anxiety. The patient also  have her doxepin increased for her sleep problem. It'll go from 10 mg to 25 mg and she'll continue taking trazodone 50 mg. This patient to return to see me in 3 months. At this time she denies any chest pain or any specific neurological symptoms. I've asked her to talk to her visiting nurse system about having a therapist come and talk to her at home. They have psychiatric services and perhaps they can have a supportive psychotherapist in terms of a nurse visit with this patient. She's mainly distressed because she so physically limited. I will be cautious with medications. I think 2 mg of Ativan is safer than 3 mg of Klonopin. This patient is not suicidal. Charlestine Night [facility] 03/21/2016, 2:02 PM

## 2016-04-03 ENCOUNTER — Other Ambulatory Visit: Payer: Self-pay

## 2016-04-03 MED ORDER — TRAMADOL HCL 50 MG PO TABS: 50.0000 mg | ORAL_TABLET | Freq: Four times a day (QID) | ORAL | Status: DC | PRN

## 2016-04-03 NOTE — Telephone Encounter (Signed)
Called into pharmacy. Patient notified that she could pick it up after 04/11/2016

## 2016-04-03 NOTE — Telephone Encounter (Signed)
Pt requesting tramadol to be filled @ [facility].

## 2016-04-08 ENCOUNTER — Telehealth: Payer: Self-pay

## 2016-04-08 NOTE — Telephone Encounter (Signed)
Pt requesting the nurse to call back regarding breathing and oxygen.

## 2016-04-09 NOTE — Telephone Encounter (Signed)
Spoke with patient who is heard wheezing during conversation.  She wasn't feeling like her o2 was coming out when set to 2L so she moved it to 2.5L and it is better.  I educated her not to turn above this number without confirmation from MD due to risks associated with her COPD.  We discussed her neb/other prn breathing treatments to help with her wheezing.   Patient also complaining of sores in her mouth and pain down her throat.  Says they look like blisters.  Her dentist gave her a rinse that she has been swishing/spitting without relief.    Scheduled for Thursday, she will call back if she can get transportation.

## 2016-04-11 ENCOUNTER — Encounter: Payer: Self-pay | Admitting: Pulmonary Disease

## 2016-04-11 ENCOUNTER — Ambulatory Visit: Payer: Self-pay | Admitting: Internal Medicine

## 2016-04-12 ENCOUNTER — Telehealth: Payer: Self-pay | Admitting: *Deleted

## 2016-04-12 NOTE — Telephone Encounter (Signed)
Received PA request from pt's pharmacy for her Xifaxan.  Pt has dx of hepatic encephalopathy.  Request approved through 04/07/2017, pharmacy aware.Despina Hidden Cassady6/16/20174:46 PM

## 2016-04-24 ENCOUNTER — Telehealth: Payer: Self-pay

## 2016-04-24 NOTE — Telephone Encounter (Signed)
Pt needs to speak with a nurse regarding meds and shaking.

## 2016-04-25 NOTE — Telephone Encounter (Signed)
Complaints of intermittent shaking of arms and hands that started several weeks ago, that seems better today. Denies chest pain or shortness of breath. Complains of weakness in both legs with previous swelling that is getting somewhat better. Denies vision changes. Complains of dizziness that started last week, no LOC. Complaints of small vaginal bleeding that started 1 week ago denies intercourse or anything in the vagina in the last week. After talking for a few minutes with the patient, her speech became slurred. Patient states that this is new for her. Advised patient to go to the ED. Patient to call a ride and if unable to find one she states she will call 911.

## 2016-04-29 ENCOUNTER — Other Ambulatory Visit: Payer: Self-pay | Admitting: Pulmonary Disease

## 2016-05-06 ENCOUNTER — Other Ambulatory Visit: Payer: Self-pay | Admitting: Pulmonary Disease

## 2016-05-06 NOTE — Telephone Encounter (Signed)
Spoke with pharmacy who says that they have a refill on file that they will fill for the patient. Patient notified and understands.

## 2016-05-06 NOTE — Telephone Encounter (Signed)
albuterol (PROVENTIL) (2.5 MG/3ML) 0.083% nebulizer solution [facility]

## 2016-05-16 ENCOUNTER — Encounter: Payer: Self-pay | Admitting: Pulmonary Disease

## 2016-06-03 ENCOUNTER — Other Ambulatory Visit: Payer: Self-pay | Admitting: Pulmonary Disease

## 2016-06-10 ENCOUNTER — Other Ambulatory Visit: Payer: Self-pay | Admitting: Pulmonary Disease

## 2016-06-14 ENCOUNTER — Other Ambulatory Visit: Payer: Self-pay | Admitting: Pulmonary Disease

## 2016-06-20 NOTE — Telephone Encounter (Signed)
rx phoned into pharmacy .Despina Hidden Cassady8/24/20171:23 PM

## 2016-06-26 ENCOUNTER — Ambulatory Visit (HOSPITAL_COMMUNITY): Payer: Self-pay | Admitting: Psychiatry

## 2016-07-05 ENCOUNTER — Other Ambulatory Visit (HOSPITAL_COMMUNITY): Payer: Self-pay | Admitting: Psychiatry

## 2016-07-05 ENCOUNTER — Other Ambulatory Visit: Payer: Self-pay | Admitting: Pulmonary Disease

## 2016-07-05 DIAGNOSIS — F331 Major depressive disorder, recurrent, moderate: Secondary | ICD-10-CM

## 2016-07-08 ENCOUNTER — Other Ambulatory Visit: Payer: Self-pay | Admitting: Pulmonary Disease

## 2016-07-08 NOTE — Telephone Encounter (Signed)
Pt needs refills on  XIFAXAN 550 MG TABS tablet traZODone (DESYREL) 50 MG tablet LORazepam (ATIVAN) 1 MG tablet meloxicam (MOBIC) 7.5 MG tablet traMADol (ULTRAM) 50 MG tablet  [facility]

## 2016-07-08 NOTE — Telephone Encounter (Signed)
There are refills on Trazadone and Meloxicam; pt called and informed. Surescript request for Lorazepam.

## 2016-07-09 MED ORDER — TRAMADOL HCL 50 MG PO TABS: 50.0000 mg | ORAL_TABLET | Freq: Four times a day (QID) | ORAL | 0 refills | Status: DC | PRN

## 2016-07-09 MED ORDER — RIFAXIMIN 550 MG PO TABS: ORAL_TABLET | ORAL | 6 refills | Status: DC

## 2016-07-09 NOTE — Telephone Encounter (Signed)
Tramadol rx called to [facility].

## 2016-07-16 ENCOUNTER — Other Ambulatory Visit: Payer: Self-pay | Admitting: *Deleted

## 2016-07-17 NOTE — Telephone Encounter (Signed)
Pt had called - return call ; informed Lorazepam refillwas denied; need to call Dr Casimiro Needle per Dr Randell Patient. Stated "alright".

## 2016-07-19 ENCOUNTER — Other Ambulatory Visit: Payer: Self-pay | Admitting: Pulmonary Disease

## 2016-07-24 ENCOUNTER — Ambulatory Visit (HOSPITAL_COMMUNITY): Payer: Self-pay | Admitting: Psychiatry

## 2016-08-23 ENCOUNTER — Other Ambulatory Visit: Payer: Self-pay | Admitting: Pulmonary Disease

## 2016-08-23 NOTE — Telephone Encounter (Signed)
Tramadol rx called to [facility].

## 2016-08-23 NOTE — Telephone Encounter (Signed)
Tramadol last refilled 07/09/16. Last OV 03/07/16; no f/u scheduled.

## 2016-08-28 ENCOUNTER — Encounter (HOSPITAL_COMMUNITY): Payer: Self-pay

## 2016-08-28 ENCOUNTER — Telehealth: Payer: Self-pay | Admitting: *Deleted

## 2016-08-28 ENCOUNTER — Inpatient Hospital Stay (HOSPITAL_COMMUNITY)
Admission: EM | Admit: 2016-08-28 | Discharge: 2016-09-01 | DRG: 433 | Disposition: A | Discharge status: Home / Self Care | Payer: Medicaid Other | Attending: Infectious Diseases | Admitting: Infectious Diseases

## 2016-08-28 ENCOUNTER — Emergency Department (HOSPITAL_COMMUNITY): Payer: Medicaid Other

## 2016-08-28 DIAGNOSIS — I8510 Secondary esophageal varices without bleeding: Secondary | ICD-10-CM | POA: Diagnosis present

## 2016-08-28 DIAGNOSIS — E871 Hypo-osmolality and hyponatremia: Secondary | ICD-10-CM | POA: Diagnosis present

## 2016-08-28 DIAGNOSIS — Z23 Encounter for immunization: Secondary | ICD-10-CM

## 2016-08-28 DIAGNOSIS — Z7951 Long term (current) use of inhaled steroids: Secondary | ICD-10-CM

## 2016-08-28 DIAGNOSIS — G8929 Other chronic pain: Secondary | ICD-10-CM | POA: Diagnosis present

## 2016-08-28 DIAGNOSIS — K766 Portal hypertension: Secondary | ICD-10-CM | POA: Diagnosis not present

## 2016-08-28 DIAGNOSIS — E872 Acidosis, unspecified: Secondary | ICD-10-CM | POA: Insufficient documentation

## 2016-08-28 DIAGNOSIS — E669 Obesity, unspecified: Secondary | ICD-10-CM | POA: Diagnosis present

## 2016-08-28 DIAGNOSIS — T502X5A Adverse effect of carbonic-anhydrase inhibitors, benzothiadiazides and other diuretics, initial encounter: Secondary | ICD-10-CM | POA: Diagnosis present

## 2016-08-28 DIAGNOSIS — K7031 Alcoholic cirrhosis of liver with ascites: Secondary | ICD-10-CM | POA: Diagnosis not present

## 2016-08-28 DIAGNOSIS — K7682 Hepatic encephalopathy: Secondary | ICD-10-CM | POA: Diagnosis present

## 2016-08-28 DIAGNOSIS — K7040 Alcoholic hepatic failure without coma: Secondary | ICD-10-CM | POA: Diagnosis present

## 2016-08-28 DIAGNOSIS — R1900 Intra-abdominal and pelvic swelling, mass and lump, unspecified site: Secondary | ICD-10-CM

## 2016-08-28 DIAGNOSIS — Z888 Allergy status to other drugs, medicaments and biological substances status: Secondary | ICD-10-CM

## 2016-08-28 DIAGNOSIS — J449 Chronic obstructive pulmonary disease, unspecified: Secondary | ICD-10-CM | POA: Diagnosis present

## 2016-08-28 DIAGNOSIS — K7460 Unspecified cirrhosis of liver: Secondary | ICD-10-CM

## 2016-08-28 DIAGNOSIS — R5381 Other malaise: Secondary | ICD-10-CM

## 2016-08-28 DIAGNOSIS — Z8049 Family history of malignant neoplasm of other genital organs: Secondary | ICD-10-CM

## 2016-08-28 DIAGNOSIS — I959 Hypotension, unspecified: Secondary | ICD-10-CM | POA: Diagnosis present

## 2016-08-28 DIAGNOSIS — E8809 Other disorders of plasma-protein metabolism, not elsewhere classified: Secondary | ICD-10-CM | POA: Insufficient documentation

## 2016-08-28 DIAGNOSIS — B182 Chronic viral hepatitis C: Secondary | ICD-10-CM | POA: Diagnosis present

## 2016-08-28 DIAGNOSIS — E876 Hypokalemia: Secondary | ICD-10-CM | POA: Diagnosis present

## 2016-08-28 DIAGNOSIS — Z9181 History of falling: Secondary | ICD-10-CM

## 2016-08-28 DIAGNOSIS — Z8619 Personal history of other infectious and parasitic diseases: Secondary | ICD-10-CM | POA: Diagnosis present

## 2016-08-28 DIAGNOSIS — Z886 Allergy status to analgesic agent status: Secondary | ICD-10-CM

## 2016-08-28 DIAGNOSIS — Z803 Family history of malignant neoplasm of breast: Secondary | ICD-10-CM

## 2016-08-28 DIAGNOSIS — F329 Major depressive disorder, single episode, unspecified: Secondary | ICD-10-CM | POA: Diagnosis present

## 2016-08-28 DIAGNOSIS — Z9981 Dependence on supplemental oxygen: Secondary | ICD-10-CM

## 2016-08-28 DIAGNOSIS — K700 Alcoholic fatty liver: Secondary | ICD-10-CM | POA: Diagnosis present

## 2016-08-28 DIAGNOSIS — I10 Essential (primary) hypertension: Secondary | ICD-10-CM | POA: Diagnosis present

## 2016-08-28 DIAGNOSIS — Z87828 Personal history of other (healed) physical injury and trauma: Secondary | ICD-10-CM

## 2016-08-28 DIAGNOSIS — Z79891 Long term (current) use of opiate analgesic: Secondary | ICD-10-CM

## 2016-08-28 DIAGNOSIS — I872 Venous insufficiency (chronic) (peripheral): Secondary | ICD-10-CM | POA: Diagnosis present

## 2016-08-28 DIAGNOSIS — M1990 Unspecified osteoarthritis, unspecified site: Secondary | ICD-10-CM | POA: Diagnosis present

## 2016-08-28 DIAGNOSIS — D696 Thrombocytopenia, unspecified: Secondary | ICD-10-CM | POA: Diagnosis present

## 2016-08-28 DIAGNOSIS — K625 Hemorrhage of anus and rectum: Secondary | ICD-10-CM

## 2016-08-28 DIAGNOSIS — R300 Dysuria: Secondary | ICD-10-CM

## 2016-08-28 DIAGNOSIS — R1907 Generalized intra-abdominal and pelvic swelling, mass and lump: Secondary | ICD-10-CM

## 2016-08-28 DIAGNOSIS — F419 Anxiety disorder, unspecified: Secondary | ICD-10-CM | POA: Diagnosis present

## 2016-08-28 DIAGNOSIS — R195 Other fecal abnormalities: Secondary | ICD-10-CM | POA: Diagnosis present

## 2016-08-28 DIAGNOSIS — Z79899 Other long term (current) drug therapy: Secondary | ICD-10-CM

## 2016-08-28 DIAGNOSIS — Z6839 Body mass index (BMI) 39.0-39.9, adult: Secondary | ICD-10-CM

## 2016-08-28 DIAGNOSIS — K7290 Hepatic failure, unspecified without coma: Secondary | ICD-10-CM

## 2016-08-28 DIAGNOSIS — R296 Repeated falls: Secondary | ICD-10-CM | POA: Diagnosis present

## 2016-08-28 DIAGNOSIS — R4182 Altered mental status, unspecified: Secondary | ICD-10-CM

## 2016-08-28 LAB — I-STAT CG4 LACTIC ACID, ED
LACTIC ACID, VENOUS: 3.26 mmol/L — AB (ref 0.5–1.9)
Lactic Acid, Venous: 5.63 mmol/L (ref 0.5–1.9)

## 2016-08-28 LAB — COMPREHENSIVE METABOLIC PANEL
ALBUMIN: 2.1 g/dL — AB (ref 3.5–5.0)
ALK PHOS: 115 U/L (ref 38–126)
ALT: 24 U/L (ref 14–54)
ANION GAP: 12 (ref 5–15)
AST: 67 U/L — ABNORMAL HIGH (ref 15–41)
BUN: <5 mg/dL — ABNORMAL LOW (ref 6–20)
CALCIUM: 8 mg/dL — AB (ref 8.9–10.3)
CHLORIDE: 97 mmol/L — AB (ref 101–111)
CO2: 24 mmol/L (ref 22–32)
Creatinine, Ser: 0.87 mg/dL (ref 0.44–1.00)
GFR calc Af Amer: >60 mL/min (ref >60)
GFR calc non Af Amer: >60 mL/min (ref >60)
GLUCOSE: 149 mg/dL — AB (ref 65–99)
POTASSIUM: 2.4 mmol/L — AB (ref 3.5–5.1)
SODIUM: 133 mmol/L — AB (ref 135–145)
Total Bilirubin: 5.3 mg/dL — ABNORMAL HIGH (ref 0.3–1.2)
Total Protein: 5.6 g/dL — ABNORMAL LOW (ref 6.5–8.1)

## 2016-08-28 LAB — CBC WITH DIFFERENTIAL/PLATELET
Basophils Absolute: 0 10*3/uL (ref 0.0–0.1)
Basophils Relative: 0 %
Eosinophils Absolute: 0.1 10*3/uL (ref 0.0–0.7)
Eosinophils Relative: 1 %
HEMATOCRIT: 37.4 % (ref 36.0–46.0)
HEMOGLOBIN: 12.7 g/dL (ref 12.0–15.0)
LYMPHS ABS: 1.6 10*3/uL (ref 0.7–4.0)
Lymphocytes Relative: 19 %
MCH: 32.2 pg (ref 26.0–34.0)
MCHC: 34 g/dL (ref 30.0–36.0)
MCV: 94.9 fL (ref 78.0–100.0)
Monocytes Absolute: 1 10*3/uL (ref 0.1–1.0)
Monocytes Relative: 11 %
NEUTROS ABS: 5.8 10*3/uL (ref 1.7–7.7)
NEUTROS PCT: 69 %
Platelets: 126 10*3/uL — ABNORMAL LOW (ref 150–400)
RBC: 3.94 MIL/uL (ref 3.87–5.11)
RDW: 18 % — ABNORMAL HIGH (ref 11.5–15.5)
WBC: 8.4 10*3/uL (ref 4.0–10.5)

## 2016-08-28 LAB — PROTIME-INR
INR: 1.72
Prothrombin Time: 20.4 seconds — ABNORMAL HIGH (ref 11.4–15.2)

## 2016-08-28 LAB — AMMONIA: AMMONIA: 49 umol/L — AB (ref 9–35)

## 2016-08-28 LAB — MAGNESIUM: MAGNESIUM: 1.7 mg/dL (ref 1.7–2.4)

## 2016-08-28 LAB — POC OCCULT BLOOD, ED: FECAL OCCULT BLD: POSITIVE — AB

## 2016-08-28 MED ORDER — FUROSEMIDE 20 MG PO TABS
20.0000 mg | ORAL_TABLET | Freq: Two times a day (BID) | ORAL | Status: DC
  Administered 2016-08-30 – 2016-09-01 (×5): 20 mg via ORAL
  Filled 2016-08-28 (×5): qty 1

## 2016-08-28 MED ORDER — ENOXAPARIN SODIUM 40 MG/0.4ML ~~LOC~~ SOLN
40.0000 mg | Freq: Every day | SUBCUTANEOUS | Status: DC
  Administered 2016-08-29 – 2016-08-31 (×4): 40 mg via SUBCUTANEOUS
  Filled 2016-08-28 (×4): qty 0.4

## 2016-08-28 MED ORDER — BUSPIRONE HCL 10 MG PO TABS
30.0000 mg | ORAL_TABLET | Freq: Every day | ORAL | Status: DC
  Administered 2016-08-29 – 2016-08-31 (×4): 30 mg via ORAL
  Filled 2016-08-28 (×2): qty 3
  Filled 2016-08-28: qty 6
  Filled 2016-08-28 (×3): qty 3

## 2016-08-28 MED ORDER — BUSPIRONE HCL 5 MG PO TABS
15.0000 mg | ORAL_TABLET | Freq: Every day | ORAL | Status: DC
  Administered 2016-08-29 – 2016-09-01 (×4): 15 mg via ORAL
  Filled 2016-08-28 (×2): qty 1

## 2016-08-28 MED ORDER — RIFAXIMIN 550 MG PO TABS
550.0000 mg | ORAL_TABLET | Freq: Two times a day (BID) | ORAL | Status: DC
  Administered 2016-08-29 – 2016-09-01 (×8): 550 mg via ORAL
  Filled 2016-08-28 (×9): qty 1

## 2016-08-28 MED ORDER — LORAZEPAM 1 MG PO TABS
1.0000 mg | ORAL_TABLET | Freq: Two times a day (BID) | ORAL | Status: DC | PRN
  Administered 2016-08-29: 1 mg via ORAL
  Filled 2016-08-28: qty 1

## 2016-08-28 MED ORDER — BUSPIRONE HCL 15 MG PO TABS: 15.0000 mg | ORAL_TABLET | ORAL | Status: DC

## 2016-08-28 MED ORDER — SODIUM CHLORIDE 0.9 % IV BOLUS (SEPSIS)
1000.0000 mL | Freq: Once | INTRAVENOUS | Status: AC
  Administered 2016-08-28: 1000 mL via INTRAVENOUS

## 2016-08-28 MED ORDER — TRAZODONE HCL 50 MG PO TABS
50.0000 mg | ORAL_TABLET | Freq: Every day | ORAL | Status: DC
  Administered 2016-08-29 – 2016-08-31 (×4): 50 mg via ORAL
  Filled 2016-08-28 (×4): qty 1

## 2016-08-28 MED ORDER — PANTOPRAZOLE SODIUM 40 MG PO TBEC
40.0000 mg | DELAYED_RELEASE_TABLET | Freq: Every day | ORAL | Status: DC
  Administered 2016-08-29 – 2016-09-01 (×4): 40 mg via ORAL
  Filled 2016-08-28 (×3): qty 1

## 2016-08-28 MED ORDER — ONDANSETRON 4 MG PO TBDP
8.0000 mg | ORAL_TABLET | Freq: Once | ORAL | Status: AC
  Administered 2016-08-28: 8 mg via ORAL
  Filled 2016-08-28: qty 2

## 2016-08-28 MED ORDER — DICLOFENAC SODIUM 1 % TD GEL
4.0000 g | Freq: Four times a day (QID) | TRANSDERMAL | Status: DC
  Administered 2016-08-31 – 2016-09-01 (×5): 4 g via TOPICAL
  Filled 2016-08-28: qty 100

## 2016-08-28 MED ORDER — POTASSIUM CHLORIDE 10 MEQ/100ML IV SOLN
10.0000 meq | INTRAVENOUS | Status: AC
  Administered 2016-08-29 (×5): 10 meq via INTRAVENOUS
  Filled 2016-08-28 (×6): qty 100

## 2016-08-28 MED ORDER — FLUTICASONE PROPIONATE 50 MCG/ACT NA SUSP
2.0000 | Freq: Every day | NASAL | Status: DC
  Administered 2016-08-29 – 2016-09-01 (×4): 2 via NASAL
  Filled 2016-08-28 (×2): qty 16

## 2016-08-28 MED ORDER — SPIRONOLACTONE 25 MG PO TABS
50.0000 mg | ORAL_TABLET | Freq: Every day | ORAL | Status: DC
  Administered 2016-08-29 – 2016-09-01 (×4): 50 mg via ORAL
  Filled 2016-08-28 (×3): qty 2

## 2016-08-28 MED ORDER — TIOTROPIUM BROMIDE MONOHYDRATE 18 MCG IN CAPS
18.0000 ug | ORAL_CAPSULE | Freq: Every day | RESPIRATORY_TRACT | Status: DC
  Administered 2016-08-29 – 2016-09-01 (×4): 18 ug via RESPIRATORY_TRACT
  Filled 2016-08-28: qty 5

## 2016-08-28 MED ORDER — POTASSIUM CHLORIDE 10 MEQ/100ML IV SOLN
10.0000 meq | INTRAVENOUS | Status: DC
  Administered 2016-08-28 (×2): 10 meq via INTRAVENOUS
  Filled 2016-08-28 (×2): qty 100

## 2016-08-28 MED ORDER — POTASSIUM CHLORIDE CRYS ER 20 MEQ PO TBCR
40.0000 meq | EXTENDED_RELEASE_TABLET | Freq: Once | ORAL | Status: AC
  Administered 2016-08-28: 40 meq via ORAL
  Filled 2016-08-28: qty 2

## 2016-08-28 MED ORDER — MOMETASONE FURO-FORMOTEROL FUM 200-5 MCG/ACT IN AERO
2.0000 | INHALATION_SPRAY | Freq: Two times a day (BID) | RESPIRATORY_TRACT | Status: DC
  Administered 2016-08-29 – 2016-09-01 (×8): 2 via RESPIRATORY_TRACT
  Filled 2016-08-28: qty 8.8

## 2016-08-28 MED ORDER — LACTULOSE 10 GM/15ML PO SOLN
20.0000 g | Freq: Three times a day (TID) | ORAL | Status: DC
  Administered 2016-08-29 – 2016-08-31 (×9): 20 g via ORAL
  Filled 2016-08-28 (×9): qty 30

## 2016-08-28 MED ORDER — PROPRANOLOL HCL 20 MG PO TABS
20.0000 mg | ORAL_TABLET | Freq: Every day | ORAL | Status: DC
  Administered 2016-08-29 – 2016-08-31 (×3): 20 mg via ORAL
  Filled 2016-08-28 (×3): qty 1

## 2016-08-28 MED ORDER — HYDROXYZINE HCL 25 MG PO TABS
25.0000 mg | ORAL_TABLET | Freq: Three times a day (TID) | ORAL | Status: DC
  Administered 2016-08-29 – 2016-09-01 (×11): 25 mg via ORAL
  Filled 2016-08-28 (×10): qty 1

## 2016-08-28 MED ORDER — ALBUTEROL SULFATE (2.5 MG/3ML) 0.083% IN NEBU: 2.5000 mg | INHALATION_SOLUTION | Freq: Four times a day (QID) | RESPIRATORY_TRACT | Status: DC | PRN

## 2016-08-28 MED ORDER — TRAMADOL HCL 50 MG PO TABS
50.0000 mg | ORAL_TABLET | Freq: Four times a day (QID) | ORAL | Status: DC | PRN
  Administered 2016-08-29: 50 mg via ORAL
  Filled 2016-08-28: qty 1

## 2016-08-28 MED ORDER — DOXEPIN HCL 25 MG PO CAPS
25.0000 mg | ORAL_CAPSULE | Freq: Every day | ORAL | Status: DC
  Administered 2016-08-29 – 2016-08-31 (×4): 25 mg via ORAL
  Filled 2016-08-28 (×5): qty 1
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
## 2016-08-28 NOTE — H&P (Signed)
Date: 08/28/2016               Patient Name:  [name] [name] MRN: [id number]  DOB: January 12, 1961 Age / Sex: 55 y.o., female   PCP: Milagros Loll, MD         Medical Service: Internal Medicine Teaching Service         Attending Physician: Dr. Campbell Riches, MD    First Contact: Dr. Reesa Chew Pager: [phone]  Second Contact: Dr. Benjamine Mola Pager: [phone]       After Hours (After 5p/  First Contact Pager: [phone]  weekends / holidays): Second Contact Pager: [phone]   Chief Complaint: abdominal pain, nausea, vomiting, leg wounds.  History of Present Illness:  This is a 55 year old female with MHx significant for cirrhosis secondary to hepatitis C (complicated by thrombocytopenia, esophageal varices and hepatic encephalopathy), COPD on nightly 2L oxygen therapy, hypertension and cervical cancer s/p TAH who presents for evaluation of a one-week history of abdominal pain and fullness, nausea and weeping leg wounds. Patient complains of diffuse abdominal pain described best as tightness, most prominent periumbilically with acute worsening today prompting her to seek medical attention. Reports associated nausea with 1 episode of nonbloody and nonbilious vomiting today. Denies any diarrhea or constipation however reports decreased appetite and food intake. Her husband who was present during examination reported she's had confusion and worsening memory for the past month, worse in the morning. In regards to the patient's leg wounds, she reports a one-week history of weeping, painful draining leg wounds. She reports medication compliance however takes only one Lactulose dose a day secondary to expected GI complications. She reports she has 1-2 bowel movements a day however frequently is unable to make it to the commode secondary to weakness and dizziness when walking causing frequent falls.   In addition to above complaints, she admits to subjective fevers, headache and a one-week history of dysuria.  Reports an increased oxygen demand, occasionally requiring oxygen during the day as well. She denies any blood in her stool, hematemesis, chest pain, diarrhea or constipation.   In the emergency department VS showed a temp of 98.6, pulse 112, respirations of 22 with an oxygen saturation of 92% on 2L via [place] and BP of 105/69. 2-view chest x-ray suggests interstitial edema. CMP revealed hypokalemia of 2.4, elevated AST at 67 and elevated T bili at 5.3. Albumin 2.1 and ammonia elevated at 47. PT elevated at 20.4. CBC without leukocytosis or anemia however did demonstrate thrombocytopenia at 126,000. Initial lactic acid 5.63 however decreased to 3.26 after 1L NS bolus. FOBT performed in ED positive. The patient was subsequently admitted to the IMTS for evaluation of hypokalemia and decompensated cirrhosis.  Meds:  Current Meds  Medication Sig  . albuterol (PROVENTIL) (2.5 MG/3ML) 0.083% nebulizer solution TAKE THREE MLS (2.5 MG TOTAL) BY NEBULIZATION EVERY 6 (SIX) HOURS AS NEEDED FOR WHEEZING OR SHORTNESS OF BREATH.  . DULERA 200-5 MCG/ACT AERO INHALE TWO PUFFS INTO THE LUNGS TWO TIMES DAILY.  Marland Kitchen PROAIR HFA 108 (90 Base) MCG/ACT inhaler INHALE TWO PUFFS INTO THE LUNGS EVERY SIX HOURS AS NEEDED FOR WHEEZING  . traMADol (ULTRAM) 50 MG tablet TAKE 1 TABLET BY MOUTH EVERY 6 HOUR AS NEEDED FOR SEVERE PAIN   Allergies: Allergies as of 08/28/2016 - Review Complete 08/28/2016  Allergen Reaction Noted  . Ibuprofen Other (See Comments) 02/26/2016  . Tylenol [acetaminophen] Other (See Comments) 03/29/2015  . Amitriptyline Other (See Comments) 01/22/2013   Past Medical History:  Diagnosis Date  . Adverse drug effect - [place] 03/07/2015  . Alcoholic cirrhosis ([place])   . Allergy   . Anxiety   . Arthritis   . cervical Cancer 1999  . Cholelithiasis   . COPD (chronic obstructive pulmonary disease) ([place])   . Decompensated hepatic cirrhosis ([place]) 03/07/2015   Side effect of Vikera  . Depression   .  Esophageal varices ([place])   . Esophagitis 2010  . Fatty liver   . Gastritis   . GERD (gastroesophageal reflux disease)   . Heart palpitations   . Hepatitis C    chronic hepatitis C and Steatohepatitis (hep grade 2, stage 2-3) per 05/13/08 liver biopsy  . Hypertension   . Obesity   . Portal hypertension ([place])   . Shortness of breath   . Splenomegaly   . Tubulovillous adenoma of colon   . Visual hallucination    since 09/2010/notes 10/30/2012   Family History:  Father: Breast cancer, deceased from MI at age 52 Sister: Cervical cancer  Social History:  Questionable smoking status. Denies any alcohol or drug use. She lives at home with her husband. Reports she has difficulty going to doctor's appointments secondary to weakness, frequent falls and unspecified pain.  Review of Systems: A complete ROS was negative except as per HPI.   Physical Exam: Blood pressure 106/85, pulse 99, temperature 99.6 F (37.6 C), temperature source Rectal, resp. rate 12, weight 216 lb 8 oz (98.2 kg), SpO2 92 %. General: Obese caucasian female resting comfortably in bed with the head of bed elevated. In no acute distress. Oxygen via [place]. Does not appear overtly jaundiced. HENT: Adent[place]. Oropharynx clear and mucous membranes moist.  Cardiovascular: Tachycardic but otherwise regular. No apparent murmur, gallop or rub. 2+ distal pulses Pulmonary: Mild bibasilar crackles appreciated. No wheezing or rhonchi.  Abdomen: Obese, distended and somewhat tense abdomen. Some tenderness to palpation most apparent in periumbilical region. No apparent caput medusa. Flaking skin seen of lower abdomen extending to groin. No appreciable fluid-wave.  Skin: Chronic changes including dry, flaking skin overlaying discoloration suggestive of chronic venous insufficiency on BL LE. Several scabbed areas on BL LE seen however no active draining lesions noted as patient suggests. No surrounding erythema suggesting infection.  Extremities:  2+ pitting edema of BL LE extending to the knees. BL toenail thickening with skin scaling extending between toes.  Neuro: alert and oriented x3 (place, year, date). Patient seems forgetful and has difficulty recalling events.   EKG: Pending  CXR: Diffuse bilateral interstitial thickening suggestive of interstitial edema. No pleural effusion, consolidation or pneumothorax.   Assessment & Plan by Problem: Principal Problem:   Hypokalemia Active Problems:   Chronic hepatitis C with cirrhosis - genotype 1a   COPD type B (HCC)   Anxiety   Chronic pain   Essential hypertension, benign   Esophageal varices in cirrhosis (HCC)   Chronic venous insufficiency   Hepatic encephalopathy (HCC)  Hypokalemia Likely secondary to cirrhotic liver disease however degree of hypokalemia surprising as patient reports compliance with spironolactone. Chart review does show long history of hypokalemia requiring numerous replacements. Potassium replacement given in ED with 40 mEq K-dur in addition to IV KCl 70mEq x6. -EKG -F/u CMP in AM and replete as needed -F/u Magnesium and replete as needed  Cirrhosis Complicated by Portal Hypertension/Esophageal Varicies, History of Hepatitis C and Alcoholism S/p successful HepC treatment with Viekira/Ribavirin (sustained SVR12 & negative viral load 2/17). Patient reports its been "awhile" since she's seen a GI doctor  secondary to difficulty making appointments. Current Meld-Na score of 23 indicating a 7-10% 90 day mortality and patient is CTP score C, however review of prior notes from her hepatologist, Dr. Patsy [place], indicates she is a poor candidate for transplant secondary to  psychiatric issues. Most recent AFP available to me in 2013 was 54.6 however several subsequent ultrasounds without definite hepatic lesion. No abdominal MRI. No appreciable fluid-wave on todays examination however due to distension and diffuse tenderness will order abdominal US for evaluation. There was  also no clear evidence for caput medusa. Chart review shows several admissions for hepatic encephalopathy with good response to lactulose. Patient reports compliance with her home medications of Spironolactone, Rifaximin, Propranolol and furosemide however question compliance due to patients hypokalemia and fluid overloaded state. -Liver biopsy in 2009 showed chronic hepatitis secondary to HepC with grade 2 stage 2-3 disease in addition to fatty liver.  -Chronic mildly elevated AST in addition to elevated T bili with thrombocytopenia and elevated PT again suggestive of liver disease -Continue Spironolactone 50 mg, Rifamixin 550 mg daily, Lasix 20 mg BID and Propranolol 20 mg once daily, although could consider increasing to BID for esophageal varicies. HR appears to be able to tolerate additional dose. -Daily weights + I&Os -Ted hose -F/u AM liver fxn + coags -Lactulose 20 mg TID -F/u HIV antibody -Initial lactic acid 5.6 which trended down to 3.2 after fluid administration. F/u repeat lactic acids  Altered Mental Status, Likely Hepatic Encepalopathy Patient and husband agree that she's been increasingly confused and forgetful for the past 30 days. Patient endorses baseline confusion and review of medical record indicates she has had several admissions for hepatic encephalopathy which responded well to Lactulose. Question polypharmacy as patient is also on several medications associated with AMS including Ativan, Trazodone, Hydroxyzine and Tramadol vs OSA. -Will monitor patients response to increased lactulose   Dysuria Patient endorses a 1 month history of dysuria and occasional subjective fevers. Will order UA for evaluation as UTI could cause AMS. -F/U UA and treat for UTI if needed -Blood cultures ordered -F/U CBC in AM  Weakness, Frequent Falls Patient reports a prolonged history of dizziness and weakness with standing and walking leading to frequent falls. She reports significant  limitation in her daily activities secondary to this and has missed numerous Drs appointments. She also complains that a non-specific pain limits her from seeing her physician regularly.  -PT/OT have been consulted for assistance. We appreciate their recommendations  Positive Fecal Occult Blood Test Patient denies any history of blood in her stool during evaluation however she admitted this to ED provider prompting FOBT and rectal examination. +FOBT however rectal exam without obvious blood. Patient is not anemic. Colonoscopy from 2010 revealed multiple polyps with suggested repeat colo in 5 years which was not performed.  -Repeat colonoscopy in the outpatient setting as patient not anemic and appears hemodynamically stable  COPD Patient has hx of COPD secondary to tobacco abuse. Patient unable to clearly answer if she is currently smoking. She uses 2L O2 QHS however mentioned she has been needing oxygen occasionally during the day recently. This is likely secondary to her liver disease. She reports compliance with her inhalers.  -Continue current therapy with Albuterol PRN, Dulera and Spiriva.   Depression and Anxiety Continue home medications of Doxepin, Hydroxyzine, Ativan 1 mg BID and Trazodone QHS. Question polypharmacy.   Diet: HH IVF: NSL. Received 1 L NS in ED. Caution for volume overload.  Code status: Full code DVT Prophylaxis: Lovenox  Dispo: Admit patient to Observation with expected length of stay less than 2 midnights.  SignedEinar Gip, DO 08/28/2016, 9:17 PM  Pager: [phone]

## 2016-08-28 NOTE — Telephone Encounter (Signed)
Pt in ED.  

## 2016-08-28 NOTE — Telephone Encounter (Signed)
Review of the chart reveals a history of cirrhosis with portal hypertension.  Lesions, swelling, and inability to walk, along with N/V are concerning.  If she is unable to make the appointment she was offered I agree with her being transported to the ED via 911 or her daughter.  A follow-up call in 1 hour to assure her safety is appreciated.

## 2016-08-28 NOTE — ED Notes (Signed)
Dr. Eulis Foster at bedside performing fecal occult.

## 2016-08-28 NOTE — ED Notes (Signed)
Pt's rectal temp. Read 99.6

## 2016-08-28 NOTE — ED Triage Notes (Signed)
Patient here with fluid weeping from arms, abdomen and back for over 1 week. She complains of nausea with same, thinks its related to her liver disease. Patient will slight yellowish tent, alert and oriented, appears restless on arrival

## 2016-08-28 NOTE — Telephone Encounter (Signed)
Pt calls and states she has multiple lesions on her body that are opening and draining "everywhere", states her legs are so swollen she cant walk, states she is dizzy and weak. States she cannot "keep anything on my stomach" due to constant N&V, she states she has been having fevers and feels "horrible" She is advised to call 911 and go to ED but she insists on a clinic appt, started to give her a 1545 for today and she states she doesn't know if she can make it due to not being able to walk and transportation. She kept saying "i dont know what to do". Advised her once again to call 911. There was a female person in the background using unbecoming language and telling her not to listen to do what he said and to get that ----- name. Informed her I would either give her an appt or she should really call 911, she states she will get her daughter to take her to ED, we agreed with the person in the background continuing. Call ended. I told her I would call back in 1 hr to check on her, she was agreeable

## 2016-08-29 ENCOUNTER — Observation Stay (HOSPITAL_COMMUNITY): Payer: Medicaid Other

## 2016-08-29 DIAGNOSIS — I10 Essential (primary) hypertension: Secondary | ICD-10-CM | POA: Diagnosis present

## 2016-08-29 DIAGNOSIS — E876 Hypokalemia: Secondary | ICD-10-CM | POA: Diagnosis present

## 2016-08-29 DIAGNOSIS — E871 Hypo-osmolality and hyponatremia: Secondary | ICD-10-CM | POA: Diagnosis not present

## 2016-08-29 DIAGNOSIS — R296 Repeated falls: Secondary | ICD-10-CM | POA: Diagnosis present

## 2016-08-29 DIAGNOSIS — M1990 Unspecified osteoarthritis, unspecified site: Secondary | ICD-10-CM | POA: Diagnosis present

## 2016-08-29 DIAGNOSIS — Z8249 Family history of ischemic heart disease and other diseases of the circulatory system: Secondary | ICD-10-CM

## 2016-08-29 DIAGNOSIS — Z87891 Personal history of nicotine dependence: Secondary | ICD-10-CM

## 2016-08-29 DIAGNOSIS — G8929 Other chronic pain: Secondary | ICD-10-CM | POA: Diagnosis present

## 2016-08-29 DIAGNOSIS — J449 Chronic obstructive pulmonary disease, unspecified: Secondary | ICD-10-CM | POA: Diagnosis present

## 2016-08-29 DIAGNOSIS — R410 Disorientation, unspecified: Secondary | ICD-10-CM

## 2016-08-29 DIAGNOSIS — F1020 Alcohol dependence, uncomplicated: Secondary | ICD-10-CM

## 2016-08-29 DIAGNOSIS — K766 Portal hypertension: Secondary | ICD-10-CM | POA: Diagnosis not present

## 2016-08-29 DIAGNOSIS — E8809 Other disorders of plasma-protein metabolism, not elsewhere classified: Secondary | ICD-10-CM | POA: Diagnosis not present

## 2016-08-29 DIAGNOSIS — K7290 Hepatic failure, unspecified without coma: Secondary | ICD-10-CM | POA: Diagnosis not present

## 2016-08-29 DIAGNOSIS — F419 Anxiety disorder, unspecified: Secondary | ICD-10-CM | POA: Diagnosis present

## 2016-08-29 DIAGNOSIS — K7031 Alcoholic cirrhosis of liver with ascites: Secondary | ICD-10-CM | POA: Diagnosis not present

## 2016-08-29 DIAGNOSIS — K700 Alcoholic fatty liver: Secondary | ICD-10-CM | POA: Diagnosis not present

## 2016-08-29 DIAGNOSIS — E669 Obesity, unspecified: Secondary | ICD-10-CM | POA: Diagnosis present

## 2016-08-29 DIAGNOSIS — F329 Major depressive disorder, single episode, unspecified: Secondary | ICD-10-CM | POA: Diagnosis present

## 2016-08-29 DIAGNOSIS — D696 Thrombocytopenia, unspecified: Secondary | ICD-10-CM | POA: Diagnosis not present

## 2016-08-29 DIAGNOSIS — I959 Hypotension, unspecified: Secondary | ICD-10-CM | POA: Diagnosis not present

## 2016-08-29 DIAGNOSIS — Z9981 Dependence on supplemental oxygen: Secondary | ICD-10-CM | POA: Diagnosis not present

## 2016-08-29 DIAGNOSIS — K7040 Alcoholic hepatic failure without coma: Secondary | ICD-10-CM | POA: Diagnosis not present

## 2016-08-29 DIAGNOSIS — R300 Dysuria: Secondary | ICD-10-CM | POA: Diagnosis not present

## 2016-08-29 DIAGNOSIS — I872 Venous insufficiency (chronic) (peripheral): Secondary | ICD-10-CM | POA: Diagnosis present

## 2016-08-29 DIAGNOSIS — E872 Acidosis: Secondary | ICD-10-CM | POA: Diagnosis not present

## 2016-08-29 DIAGNOSIS — B182 Chronic viral hepatitis C: Secondary | ICD-10-CM | POA: Diagnosis present

## 2016-08-29 DIAGNOSIS — I8510 Secondary esophageal varices without bleeding: Secondary | ICD-10-CM | POA: Diagnosis not present

## 2016-08-29 DIAGNOSIS — Z23 Encounter for immunization: Secondary | ICD-10-CM | POA: Diagnosis not present

## 2016-08-29 DIAGNOSIS — R195 Other fecal abnormalities: Secondary | ICD-10-CM | POA: Diagnosis present

## 2016-08-29 LAB — URINALYSIS, ROUTINE W REFLEX MICROSCOPIC
Bilirubin Urine: NEGATIVE
GLUCOSE, UA: NEGATIVE mg/dL
HGB URINE DIPSTICK: NEGATIVE
Ketones, ur: NEGATIVE mg/dL
Leukocytes, UA: NEGATIVE
Nitrite: NEGATIVE
PROTEIN: NEGATIVE mg/dL
Specific Gravity, Urine: 1.006 (ref 1.005–1.030)
pH: 6.5 (ref 5.0–8.0)

## 2016-08-29 LAB — CBC
HCT: 37.7 % (ref 36.0–46.0)
HEMOGLOBIN: 13 g/dL (ref 12.0–15.0)
MCH: 32.7 pg (ref 26.0–34.0)
MCHC: 34.5 g/dL (ref 30.0–36.0)
MCV: 94.7 fL (ref 78.0–100.0)
PLATELETS: 100 10*3/uL — AB (ref 150–400)
RBC: 3.98 MIL/uL (ref 3.87–5.11)
RDW: 18.3 % — AB (ref 11.5–15.5)
WBC: 6.7 10*3/uL (ref 4.0–10.5)

## 2016-08-29 LAB — BODY FLUID CELL COUNT WITH DIFFERENTIAL
EOS FL: 0 %
LYMPHS FL: 62 %
MONOCYTE-MACROPHAGE-SEROUS FLUID: 31 % — AB (ref 50–90)
NEUTROPHIL FLUID: 7 % (ref 0–25)
Other Cells, Fluid: 0 %
WBC FLUID: 126 uL (ref 0–1000)

## 2016-08-29 LAB — COMPREHENSIVE METABOLIC PANEL
ALT: 23 U/L (ref 14–54)
AST: 61 U/L — ABNORMAL HIGH (ref 15–41)
Albumin: 2 g/dL — ABNORMAL LOW (ref 3.5–5.0)
Alkaline Phosphatase: 116 U/L (ref 38–126)
Anion gap: 9 (ref 5–15)
BUN: <5 mg/dL — ABNORMAL LOW (ref 6–20)
CALCIUM: 7.8 mg/dL — AB (ref 8.9–10.3)
CHLORIDE: 100 mmol/L — AB (ref 101–111)
CO2: 24 mmol/L (ref 22–32)
CREATININE: 0.74 mg/dL (ref 0.44–1.00)
Glucose, Bld: 106 mg/dL — ABNORMAL HIGH (ref 65–99)
Potassium: 3.6 mmol/L (ref 3.5–5.1)
Sodium: 133 mmol/L — ABNORMAL LOW (ref 135–145)
TOTAL PROTEIN: 5.6 g/dL — AB (ref 6.5–8.1)
Total Bilirubin: 4.8 mg/dL — ABNORMAL HIGH (ref 0.3–1.2)

## 2016-08-29 LAB — PROTIME-INR
INR: 1.75
PROTHROMBIN TIME: 20.6 s — AB (ref 11.4–15.2)

## 2016-08-29 LAB — LACTIC ACID, PLASMA
LACTIC ACID, VENOUS: 3.4 mmol/L — AB (ref 0.5–1.9)
LACTIC ACID, VENOUS: 3 mmol/L — AB (ref 0.5–1.9)

## 2016-08-29 LAB — HIV ANTIBODY (ROUTINE TESTING W REFLEX): HIV Screen 4th Generation wRfx: NONREACTIVE

## 2016-08-29 MED ORDER — POTASSIUM CHLORIDE CRYS ER 20 MEQ PO TBCR
20.0000 meq | EXTENDED_RELEASE_TABLET | Freq: Once | ORAL | Status: AC
  Administered 2016-08-30: 20 meq via ORAL
  Filled 2016-08-29: qty 1

## 2016-08-29 MED ORDER — SODIUM CHLORIDE 0.9 % IV BOLUS (SEPSIS)
500.0000 mL | Freq: Once | INTRAVENOUS | Status: AC
  Administered 2016-08-29: 500 mL via INTRAVENOUS

## 2016-08-29 NOTE — Progress Notes (Signed)
CRITICAL VALUE ALERT  Critical value received:  Lactic Acid 3.4  Date of notification:  11/2  Time of notification:  0022  Critical value read back: yes  Nurse who received alert:  Jari Sportsman  MD notified (1st page):  Dr Danford Bad  Time of first page:  0024  MD notified (2nd page):  Time of second page:  Responding MD:  Dr Danford Bad  Time MD responded:  [phone]

## 2016-08-29 NOTE — Evaluation (Signed)
Physical Therapy Evaluation Patient Details Name: [name] [name] MRN: [id number] DOB: 03-11-1961 Today's Date: 08/29/2016   History of Present Illness  This is a 55 year old female with MHx significant for cirrhosis secondary to hepatitis C (complicated by thrombocytopenia, esophageal varices and hepatic encephalopathy), COPD on nightly 2L oxygen therapy, hypertension and cervical cancer s/p TAH who presents for evaluation of a one-week history of abdominal pain and fullness, nausea and weeping leg wounds. Her husband who was present during ED examination reported she's had confusion and worsening memory for the past month. In regards to the patient's leg wounds, she reports a one-week history of weeping, painful draining leg wounds. She reports she has 1-2 bowel movements a day however frequently is unable to make it to the commode secondary to weakness and dizziness when walking causing frequent falls. The patient was subsequently admitted to the IMTS for evaluation of hypokalemia and decompensated cirrhosis.     Clinical Impression  Patient presents with problems listed below.  Will benefit from acute PT to maximize functional mobility prior to discharge.  Session limited today by patient's fatigue and pain.  Recommend HHPT for continued therapy at d/c.    Follow Up Recommendations Home health PT;Supervision for mobility/OOB    Equipment Recommendations  3in1 (PT)    Recommendations for Other Services       Precautions / Restrictions Precautions Precautions: Fall Precaution Comments: History of Falls Restrictions Weight Bearing Restrictions: No      Mobility  Bed Mobility Overal bed mobility: Needs Assistance Bed Mobility: Supine to Sit;Sit to Supine;Rolling Rolling: Mod assist   Supine to sit: Max assist;HOB elevated Sit to supine: Max assist;HOB elevated   General bed mobility comments: Cues to move to EOB.  Assist to move LE's off of bed and to bring trunk to sitting  position.  Patient able to sit for short period of time, and states "I can't do this".  Returns to supine with max assist for trunk and LE's.  Patient required mod assist and use of rail to roll to both sides to reposition bed pad.  Transfers                 General transfer comment: Declined  Ambulation/Gait                Stairs            Wheelchair Mobility    Modified Rankin (Stroke Patients Only)       Balance   Sitting-balance support: Single extremity supported Sitting balance-Leahy Scale: Poor                                       Pertinent Vitals/Pain Pain Assessment: Faces Faces Pain Scale: Hurts a little bit Pain Location: abdomen Pain Descriptors / Indicators: Discomfort;Grimacing Pain Intervention(s): Monitored during session    Home Living Family/patient expects to be discharged to:: Private residence Living Arrangements: Spouse/significant other Available Help at Discharge: Family;Available PRN/intermittently Type of Home: Apartment Home Access: Level entry     Home Layout: One level Home Equipment: Walker - 4 wheels;Cane - single point;Shower seat      Prior Function Level of Independence: Needs assistance   Gait / Transfers Assistance Needed: furniture walking, use of SPC and walker (Husband reports non-compliance with DME)  ADL's / Homemaking Assistance Needed: dependent on husband for IADL, Pt reports not being able to reach down to feet due  to pain in abdomen, Mod A for bathing/dressing        Hand Dominance   Dominant Hand: Right    Extremity/Trunk Assessment   Upper Extremity Assessment: Generalized weakness           Lower Extremity Assessment: Generalized weakness (Edema BLE's)         Communication   Communication: No difficulties  Cognition Arousal/Alertness: Awake/alert Behavior During Therapy: WFL for tasks assessed/performed;Flat affect Overall Cognitive Status:  Impaired/Different from baseline Area of Impairment: Attention;Memory;Problem solving   Current Attention Level: Sustained Memory: Decreased short-term memory       Problem Solving: Slow processing;Requires tactile cues;Requires verbal cues General Comments: Per husband, decline over past month.    General Comments      Exercises     Assessment/Plan    PT Assessment Patient needs continued PT services  PT Problem List Decreased strength;Decreased activity tolerance;Decreased balance;Decreased mobility;Decreased coordination;Decreased cognition;Decreased knowledge of use of DME;Cardiopulmonary status limiting activity;Obesity;Pain          PT Treatment Interventions DME instruction;Gait training;Functional mobility training;Therapeutic activities;Therapeutic exercise;Balance training;Cognitive remediation;Patient/family education    PT Goals (Current goals can be found in the Care Plan section)  Acute Rehab PT Goals Patient Stated Goal: Go home PT Goal Formulation: With patient/family Time For Goal Achievement: 09/05/16 Potential to Achieve Goals: Fair    Frequency Min 3X/week   Barriers to discharge        Co-evaluation               End of Session Equipment Utilized During Treatment: Oxygen Activity Tolerance: No increased pain;Patient limited by fatigue;Patient limited by lethargy Patient left: in bed;with call bell/phone within reach;with bed alarm set;with family/visitor present      Functional Assessment Tool Used: Clinical judgement Functional Limitation: Mobility: Walking and moving around Mobility: Walking and Moving Around Current Status [id number]): At least 40 percent but less than 60 percent impaired, limited or restricted Mobility: Walking and Moving Around Goal Status [phone]): At least 1 percent but less than 20 percent impaired, limited or restricted    Time: [id number] PT Time Calculation (min) (ACUTE ONLY): 17 min   Charges:   PT  Evaluation $PT Eval Moderate Complexity: 1 Procedure     PT G Codes:   PT G-Codes **NOT FOR INPATIENT CLASS** Functional Assessment Tool Used: Clinical judgement Functional Limitation: Mobility: Walking and moving around Mobility: Walking and Moving Around Current Status [id number]): At least 40 percent but less than 60 percent impaired, limited or restricted Mobility: Walking and Moving Around Goal Status [phone]): At least 1 percent but less than 20 percent impaired, limited or restricted    Despina Pole 08/29/2016, 3:58 PM Carita Pian. Sanjuana Kava, [place] Pager [phone]

## 2016-08-29 NOTE — Procedures (Signed)
Paracentesis Procedure Note  Indications:  Ascites  Procedure Details  Informed consent was obtained after explanation of the risks and benefits of the procedure, refer to the consent documentation.  Time-out was performed immediately prior to the procedure.  The head of the bed was placed 30-45 degrees above level and the meniscus of the ascites was evaluated by bedside ultrasound and a large area of ascitic fluid was identified in the Right upper quadrant.  Local anesthesia with 1 percent lidocaine was introduced subcutaneously then deep to the skin until the parietal peritoneum was anesthetized. A parcentesis needle was introduced into this site until ascitic fluid was encountered.  Ascitic fluid and the needle were removed with minimal bleeding.  A sterile bandage was placed after holding pressure.   Findings: 60mL of transparent ascites fluid was obtained for analysis.  The ascites fluid was sent for gram stain, culture, and cell count        Condition:   The patient tolerated the procedure well and remains in the same condition as pre-procedure.  Complications: None

## 2016-08-29 NOTE — Progress Notes (Signed)
Subjective: Pt. Was feeling a little better when seen today. Still having abdominal pain, denies any current nausea or vomiting. Her husband c/o increased confusion for last 3 wks. He states that she is having multiple falls recently, did hurt her head about 3 wks ago. This resulted in some scalp edema, no open wound, and according to husband her confusion has increased since then. On talking with patient, looks like she is staying somewhat constipated, having 1 bowel movement which is most of the time hard. She was not taking lactulose as directed to have 2-3 bowel movements every day. She also complained of lower extremity swelling along with dry scaly patches, worse on her legs but to extend to her thighs. She states she was having some weeping wounds before.  Objective:  Vital signs in last 24 hours: Vitals:   08/29/16 0826 08/29/16 0852 08/29/16 0855 08/29/16 1023  BP: (!) 82/45 (!) 81/43 104/82 (!) 105/57  Pulse: 91 95  88  Resp:      Temp:      TempSrc:      SpO2: 94% 95% 98% 96%  Weight:       Gen. well built, alert and oriented lady, lying comfortably in her bed. Neck. Supple, no JVD  Lungs. Clear bilaterally  CV. Regular rate and rhythm  Abdomen. Mildly tense, distended, diffusely tender , enlarged liver, fluid wave positive , bowel sounds positive  Extremities. 2+ pitting edema up to knees , dry scaly skin , few healed scars from previous weeping wounds , no active lesion. pulses symmetrical and present.  Labs. CBC Latest Ref Rng & Units 08/29/2016 08/28/2016 02/27/2016  WBC 4.0 - 10.5 K/uL 6.7 8.4 5.4  Hemoglobin 12.0 - 15.0 g/dL 13.0 12.7 13.9  Hematocrit 36.0 - 46.0 % 37.7 37.4 40.9  Platelets 150 - 400 K/uL 100(L) 126(L) 85(L)   CMP Latest Ref Rng & Units 08/29/2016 08/28/2016 02/27/2016  Glucose 65 - 99 mg/dL 106(H) 149(H) 250(H)  BUN 6 - 20 mg/dL <5(L) <5(L) 10  Creatinine 0.44 - 1.00 mg/dL 0.74 0.87 0.86  Sodium 135 - 145 mmol/L 133(L) 133(L) 131(L)  Potassium 3.5 -  5.1 mmol/L 3.6 2.4(LL) 3.8  Chloride 101 - 111 mmol/L 100(L) 97(L) 97(L)  CO2 22 - 32 mmol/L 24 24 23   Calcium 8.9 - 10.3 mg/dL 7.8(L) 8.0(L) 8.2(L)  Total Protein 6.5 - 8.1 g/dL 5.6(L) 5.6(L) 6.4(L)  Total Bilirubin 0.3 - 1.2 mg/dL 4.8(H) 5.3(H) 2.7(H)  Alkaline Phos 38 - 126 U/L 116 115 122  AST 15 - 41 U/L 61(H) 67(H) 62(H)  ALT 14 - 54 U/L 23 24 30    Urinalysis    Component Value Date/Time   COLORURINE YELLOW 08/29/2016 0148   APPEARANCEUR CLEAR 08/29/2016 0148   LABSPEC 1.006 08/29/2016 0148   PHURINE 6.5 08/29/2016 0148   GLUCOSEU NEGATIVE 08/29/2016 0148   HGBUR NEGATIVE 08/29/2016 0148   BILIRUBINUR NEGATIVE 08/29/2016 0148   KETONESUR NEGATIVE 08/29/2016 0148   PROTEINUR NEGATIVE 08/29/2016 0148   UROBILINOGEN 1.0 03/05/2015 1830   NITRITE NEGATIVE 08/29/2016 0148   LEUKOCYTESUR NEGATIVE 08/29/2016 0148    Lactic acid. 5.63>>3.26>>3.4>>3.0 FOBT. Positive Ammonia.  49 HIV Antibody. Non Reactive  US Abdomen.  FINDINGS: Gallbladder: The gallbladder wall measures 5 mm in thickness. Stones are identified measuring up to 9 mm. 4 mm polyp noted.  Common bile duct: Diameter: 3.8 mm.  Liver: The liver appears cirrhotic. The echotexture of the liver parenchyma is heterogeneous and the contour the liver is lobular.  IVC: No abnormality visualized.  Pancreas: Visualized portion unremarkable.  Spleen: Measures 9 mm in length.  Right Kidney: Length: 10.9 cm. Echogenicity within normal limits. No mass or hydronephrosis visualized.  Left Kidney: Length: 12.0 cm. Echogenicity within normal limits. No mass or hydronephrosis visualized.  Abdominal aorta: No aneurysm visualized.  Other findings: Moderate volume of abdominal ascites identified.  IMPRESSION: 1. Morphologic features of liver compatible with cirrhosis. 2. Ascites 3. Gallbladder wall thickening and gallstones noted. In the setting of cirrhosis and ascites the gallbladder wall thickening is a  nonspecific finding.  CT head. FINDINGS: Brain: Mild brain atrophy and chronic white matter are microvascular ischemic changes about the lateral ventricles. No acute intracranial hemorrhage, mass lesion, infarction, midline shift, herniation, hydrocephalus, or extra-axial fluid collection. Cisterns are patent. Cerebellar atrophy as well.  Vascular: No hyperdense vessel or unexpected calcification.  Skull: Normal. Negative for fracture or focal lesion.  Sinuses/Orbits: No acute finding.  Other: None.  IMPRESSION: Brain atrophy and mild chronic white matter microvascular changes.  No acute intracranial finding by noncontrast CT.  Assessment/Plan:  55 year old female with MHx significant for cirrhosis secondary to hepatitis C (complicated by thrombocytopenia, esophageal varices and hepatic encephalopathy), COPD on nightly 2L oxygen therapy, hypertension and cervical cancer s/p TAH who presents for evaluation of a one-week history of abdominal pain and fullness, nausea and weeping leg wounds.  Cirrhosis Complicated by Portal Hypertension/Esophageal Varicies, History of Hepatitis C and Alcoholism. S/p successful HepC treatment with Viekira/Ribavirin (sustained SVR12 & negative viral load 2/17).  She is having mildly elevated ammonia level, she needs to use lactulose more often, with a goal of 2-3 soft bowel movements every day. Her abdominal ultrasound were done today was positive for cirrhosis and moderate amount of ascites. -Paracentesis to rule out SBP -Lactulose 20 g 3 times a day. -Rifaximin 550 mg twice daily -Continue spironolactone 50 mg daily -Lasix 20 mg twice daily -Propranolol 20 mg daily.  Altered Mental Status, Likely Hepatic Encephalopathy. CT head done because of the history of recent head injury, was not showing any acute or chronic hemorrhage or any other acute change. Polypharmacy including Ativan, trazodone, hydroxyzine and Tramadol can be  contributory. -Lactulose 3 times a day with goal of 2-3 soft bowel movements/ day.  History of frequent falls. Most probably due to her advanced liver disease, along with deconditioning. -PT and OT evaluation and treatment.  Hypokalemia. Resolved with replacement.  Positive FOBT. She needs a outpatient referral to GI. For repeat EGD and colonoscopy. Her last EGD was in 2012 and colonoscopy was in 2010, with a recommendation to repeat in 5 years because of multiple polyps.  Dysuria. She has cyst nonspecific complaint for more than a month. Her urine analysis looks clear. She is afebrile and without any leukocytosis.  COPD. she has no symptoms suggesting any exacerbation. She is on 2 L home oxygen she states that she mostly uses at night,. -Continue current therapy with Albuterol PRN, Dulera and Spiriva.   Depression and Anxiety Continue home medications of Doxepin, Hydroxyzine, Ativan 1 mg BID PRN and Trazodone QHS,    Dispo: Anticipated discharge in approximately 1-2 day(s).   Lorella Nimrod, MD 08/29/2016, 11:35 AM Pager: [phone]

## 2016-08-29 NOTE — H&P (Signed)
  Date: 08/29/2016  Patient name: [name] [name]  Medical record number: [id number]  Date of birth: 08-Apr-1961   I have seen and evaluated [name] [name] and discussed their care with the Residency Team. 55 yo F with hx of Cirrhosis due to hepatitis C, etoh use. She was prev treated with vikera/ribavirin. Her last Hep C RNA was (-) 11-2015.  She comes to ED 11-1 with 3 weeks of worsening confusion, lower abd pain and weeping. She Takes her lactulose daily (as opposed to tid as written) and has daily BM.  At home, she has had fever and dysuria as well. Weakness and frequent falls. She denies home health.  She has home O2 at night, she has been using more frequently during the daytime but denies sob.  In ED she was found to be hypokalemic.   PMHx, Fam Hx, and/or Soc Hx :  Hep C  Vitals:   08/28/16 2315 08/29/16 0444  BP: 111/69 (!) 87/48  Pulse: 94 94  Resp: 16 16  Temp: 98.3 F (36.8 C) 97.9 F (36.6 C)   Mental status- alert and oriented.  Neck: no JVP Chest: cta CV: RRR Abd: distended, ascites/fluid wave, liver edge palpable, tender. BS+ Extr: +edema, scaling, dry skin. No gross open lesions or fluctuance or d/c noted.   Labs of note: Lactate 5.63 --> 3.0 WBC 6.7 K+  2.4 --> 3.6 AST 61 ALT 23   T bil 4.8   Ammonia 49  INR 1.75   Assessment and Plan: I have seen and evaluated the patient as outlined above. I agree with the formulated Assessment and Plan as detailed in the residents' note, with the following changes:   1. Hypokalemia Improved with repletion  2. Cirrhosis, previously treated hepatitis C continue her lactulose, xifaxan Increase dose to (lactulose) TID.   3. FOBT Her H/h is stable.  She will need colonoscopy as outpt.   4. Confusion Hopefully will clear with increased lactulose.  Will check CT head due to previous fall to eval for subdural hematoma.  PT/OT  Campbell Riches, MD 11/2/20177:54 AM

## 2016-08-29 NOTE — Evaluation (Signed)
Occupational Therapy Evaluation Patient Details Name: [name] [name] MRN: [id number] DOB: [birth year] Today's Date: 08/29/2016    History of Present Illness This is a 55 year old female with MHx significant for cirrhosis secondary to hepatitis C (complicated by thrombocytopenia, esophageal varices and hepatic encephalopathy), COPD on nightly 2L oxygen therapy, hypertension and cervical cancer s/p TAH who presents for evaluation of a one-week history of abdominal pain and fullness, nausea and weeping leg wounds. Her husband who was present during ED examination reported she's had confusion and worsening memory for the past month. In regards to the patient's leg wounds, she reports a one-week history of weeping, painful draining leg wounds. She reports she has 1-2 bowel movements a day however frequently is unable to make it to the commode secondary to weakness and dizziness when walking causing frequent falls. The patient was subsequently admitted to the IMT[facility] for evaluation of hypokalemia and decompensated cirrhosis. Pt with plentiful history. Please see H&P note for further information.   Clinical Impression   PTA Pt mod assist with ADL, mod I for mobility with various DME with history of falls. Pt currently at baseline for ADL and mobility. This evaluation limited due to low BP (81/43) improved with sitting to (104/82) so did stand pivot to [facility] for safety. Pt denied any light headedness, faintness, or dizziness when in sitting. Pt seemed confused at times during session. Pt will benefit from skilled OT in the acute care setting to maximize independence in ADL. Pt and husband requesting help at home with ADL. Next session to focus on AE education (Pt eligible for AE kit).    Follow Up Recommendations  Home health OT;Supervision - Intermittent    Equipment Recommendations  3 in 1 bedside comode (AE kit)    Recommendations for Other Services       Precautions / Restrictions  Precautions Precautions: Fall Precaution Comments: History of Falls Restrictions Weight Bearing Restrictions: No      Mobility Bed Mobility Overal bed mobility: Needs Assistance Bed Mobility: Supine to Sit;Sit to Supine     Supine to sit: Mod assist;HOB elevated Sit to supine: Mod assist   General bed mobility comments: Pt needs assist for BLE to EOB and to get back in bed, Pt able to perform bed mobility to move up in the bed   Transfers Overall transfer level: Needs assistance Equipment used: Rolling walker (2 wheeled) Transfers: Sit to/from [facility] Sit to Stand: Mod assist Stand pivot transfers: Min assist       General transfer comment: Pt was much stronger coming off BSC then standing up from bed    Balance Overall balance assessment: Needs assistance Sitting-balance support: Bilateral upper extremity supported;Feet supported Sitting balance-Leahy Scale: Poor   Postural control: Posterior lean Standing balance support: Bilateral upper extremity supported;During functional activity Standing balance-Leahy Scale: Poor                              ADL Overall ADL's : Needs assistance/impaired Eating/Feeding: Set up;Bed level   Grooming: Wash/dry face;Bed level           Upper Body Dressing : Set up;Sitting   Lower Body Dressing: Moderate assistance;Sit to/from stand (Pt does not like for men to assit dressing her)   Toilet Transfer: Moderate assistance;Stand-pivot;+2 for safety/equipment;BSC;RW Toilet Transfer Details (indicate cue type and reason): Pt stated "I cannot go in this environment, I need peace and quiet and serenity" Toileting- Clothing Manipulation and  Hygiene: Minimal assistance Toileting - Clothing Manipulation Details (indicate cue type and reason): assist with gown     Functional mobility during ADLs: Minimal assistance;Rolling walker General ADL Comments: limited evaluation due to low BP (81/43) improved  with sitting to (104/82) so did stand pivot to [facility] for safety     Vision     Perception     Praxis      Pertinent Vitals/Pain       Hand Dominance Right   Extremity/Trunk Assessment Upper Extremity Assessment Upper Extremity Assessment: Overall WFL for tasks assessed;Generalized weakness   Lower Extremity Assessment Lower Extremity Assessment: Generalized weakness;Overall WFL for tasks assessed       Communication Communication Communication: No difficulties   Cognition Arousal/Alertness: Awake/alert Behavior During Therapy: WFL for tasks assessed/performed Overall Cognitive Status: Impaired/Different from baseline Area of Impairment: Attention;Problem solving   Current Attention Level: Sustained         Problem Solving: Slow processing;Requires tactile cues;Requires verbal cues General Comments: general change in baseline over past month, Pt had trouble making decisions, "I have to go to the bathroom, and the next min, "I don't have to go in the bathroom"   General Comments       Exercises       Shoulder Instructions      Home Living Family/patient expects to be discharged to:: Private residence Living Arrangements: Spouse/significant other Available Help at Discharge: Family;Available PRN/intermittently Type of Home: Apartment             Bathroom Shower/Tub: Occupational psychologist: Standard Bathroom Accessibility: Yes How Accessible: Accessible via walker Home Equipment: [place] - 4 wheels;Cane - single point;Shower seat   Additional Comments: Per husband, he does all IADL, that Pt stays in bed most of the day, that house is too crowded with furntiture to use RW in house, and that Pt does not like to leave house      Prior Functioning/Environment Level of Independence: Needs assistance  Gait / Transfers Assistance Needed: furniture walking, use of SPC and walker (Husband reports non-compliance with DME) ADL's / Homemaking Assistance  Needed: dependent on husband for IADL, Pt reports not being able to reach down to feet due to pain in abdomen, Mod A for bathing/dressing            OT Problem List: Decreased strength;Decreased range of motion;Decreased activity tolerance;Impaired balance (sitting and/or standing);Decreased safety awareness;Decreased knowledge of use of DME or AE;Obesity;Pain   OT Treatment/Interventions: Self-care/ADL training;Energy conservation;DME and/or AE instruction;Therapeutic activities;Patient/family education;Balance training    OT Goals(Current goals can be found in the care plan section) Acute Rehab OT Goals Patient Stated Goal: get more independent OT Goal Formulation: With patient/family Time For Goal Achievement: 09/12/16 Potential to Achieve Goals: Good ADL Goals Pt Will Perform Lower Body Bathing: with supervision;sitting/lateral leans;with adaptive equipment Pt Will Perform Lower Body Dressing: with set-up;with adaptive equipment;with caregiver independent in assisting;sit to/from stand Pt Will Transfer to Toilet: with supervision;ambulating;regular height toilet Pt Will Perform Toileting - Clothing Manipulation and hygiene: with modified independence;sit to/from stand Additional ADL Goal #1: Pt will recall 2 ways to conserve energy during ADL, and then implement them during grooming  OT Frequency: Min 3X/week   Barriers to D/C:            Co-evaluation              End of Session Equipment Utilized During Treatment: Rolling walker;Gait belt;Oxygen Nurse Communication: Mobility status  Activity Tolerance: Patient tolerated  treatment well Patient left: Other (comment) (going with transport for an ultrasound)   Time: 1660-6004 OT Time Calculation (min): 45 min Charges:  OT General Charges $OT Visit: 1 Procedure OT Evaluation $OT Eval Moderate Complexity: 1 Procedure OT Treatments $Self Care/Home Management : 23-37 mins G-Codes: OT G-codes **NOT FOR INPATIENT  CLASS** Functional Assessment Tool Used: Clinical Judgement Functional Limitation: Self care Self Care Current Status (H9977): At least 60 percent but less than 80 percent impaired, limited or restricted Self Care Goal Status (S1423): At least 20 percent but less than 40 percent impaired, limited or restricted  Jaci Carrel 08/29/2016, 11:14 AM Hulda Humphrey OTR/L [phone]

## 2016-08-30 ENCOUNTER — Other Ambulatory Visit (HOSPITAL_COMMUNITY): Payer: Self-pay | Admitting: Psychiatry

## 2016-08-30 ENCOUNTER — Other Ambulatory Visit: Payer: Self-pay | Admitting: Pulmonary Disease

## 2016-08-30 ENCOUNTER — Inpatient Hospital Stay (HOSPITAL_COMMUNITY): Payer: Medicaid Other

## 2016-08-30 DIAGNOSIS — F331 Major depressive disorder, recurrent, moderate: Secondary | ICD-10-CM

## 2016-08-30 LAB — COMPREHENSIVE METABOLIC PANEL
ALT: 21 U/L (ref 14–54)
AST: 52 U/L — AB (ref 15–41)
Albumin: 1.7 g/dL — ABNORMAL LOW (ref 3.5–5.0)
Alkaline Phosphatase: 126 U/L (ref 38–126)
Anion gap: 7 (ref 5–15)
BILIRUBIN TOTAL: 3.1 mg/dL — AB (ref 0.3–1.2)
CO2: 23 mmol/L (ref 22–32)
CREATININE: 0.57 mg/dL (ref 0.44–1.00)
Calcium: 7.4 mg/dL — ABNORMAL LOW (ref 8.9–10.3)
Chloride: 106 mmol/L (ref 101–111)
Glucose, Bld: 113 mg/dL — ABNORMAL HIGH (ref 65–99)
POTASSIUM: 3.4 mmol/L — AB (ref 3.5–5.1)
Sodium: 136 mmol/L (ref 135–145)
TOTAL PROTEIN: 4.8 g/dL — AB (ref 6.5–8.1)

## 2016-08-30 LAB — CBC
HCT: 35.5 % — ABNORMAL LOW (ref 36.0–46.0)
Hemoglobin: 11.7 g/dL — ABNORMAL LOW (ref 12.0–15.0)
MCH: 32.2 pg (ref 26.0–34.0)
MCHC: 33 g/dL (ref 30.0–36.0)
MCV: 97.8 fL (ref 78.0–100.0)
PLATELETS: 120 10*3/uL — AB (ref 150–400)
RBC: 3.63 MIL/uL — AB (ref 3.87–5.11)
RDW: 19 % — AB (ref 11.5–15.5)
WBC: 5.6 10*3/uL (ref 4.0–10.5)

## 2016-08-30 LAB — PATHOLOGIST SMEAR REVIEW

## 2016-08-30 MED ORDER — PNEUMOCOCCAL VAC POLYVALENT 25 MCG/0.5ML IJ INJ
0.5000 mL | INJECTION | INTRAMUSCULAR | Status: AC
  Administered 2016-08-31: 0.5 mL via INTRAMUSCULAR
  Filled 2016-08-30: qty 0.5

## 2016-08-30 MED ORDER — POTASSIUM CHLORIDE CRYS ER 20 MEQ PO TBCR
20.0000 meq | EXTENDED_RELEASE_TABLET | Freq: Once | ORAL | Status: AC
  Administered 2016-08-30: 20 meq via ORAL
  Filled 2016-08-30: qty 1

## 2016-08-30 MED ORDER — SODIUM CHLORIDE 0.9 % IV BOLUS (SEPSIS)
500.0000 mL | Freq: Once | INTRAVENOUS | Status: AC
  Administered 2016-08-30: 500 mL via INTRAVENOUS

## 2016-08-30 MED ORDER — INFLUENZA VAC SPLIT QUAD 0.5 ML IM SUSY
0.5000 mL | PREFILLED_SYRINGE | INTRAMUSCULAR | Status: AC
  Administered 2016-08-31: 0.5 mL via INTRAMUSCULAR
  Filled 2016-08-30: qty 0.5

## 2016-08-30 MED ORDER — LIDOCAINE HCL 1 % IJ SOLN
INTRAMUSCULAR | Status: AC
  Filled 2016-08-30: qty 20

## 2016-08-30 MED ORDER — POLYETHYLENE GLYCOL 3350 17 G PO PACK
17.0000 g | PACK | Freq: Every day | ORAL | Status: DC
  Administered 2016-08-30 – 2016-08-31 (×2): 17 g via ORAL
  Filled 2016-08-30 (×2): qty 1

## 2016-08-30 NOTE — Progress Notes (Signed)
  Date: 08/30/2016  Patient name: [name] [name]  Medical record number: [id number]  Date of birth: 1961-03-12   This patient's plan of care was discussed with the house staff. Please see their note for complete details. I concur with their findings. Without complaints.  Still distended.  Vitals:   08/30/16 0227 08/30/16 0351  BP: (!) 86/54 95/61  Pulse:  72  Resp:  17  Temp:  98.6 F (37 C)  cv- rrr Chest- cta abd- bs+, soft, nontender  A/P Cirrhosis Will work on increasing her BM Speak with IR about possible therapeutic paracentesis Her fluid does not appear infected.  Her mental status is much better, anticipate d/c in next 24h.    Campbell Riches, MD 08/30/2016, 8:36 AM

## 2016-08-30 NOTE — Progress Notes (Signed)
Subjective: He should still complaining of abdominal pain, she had one bowel movement this morning. She states she is having some drainage from the paracentesis site yesterday.  Objective:  Vital signs in last 24 hours: Vitals:   08/30/16 0351 08/30/16 0600 08/30/16 0926 08/30/16 0927  BP: 95/61     Pulse: 72     Resp: 17     Temp: 98.6 F (37 C)     TempSrc: Oral     SpO2: 91%  92% 92%  Weight:  224 lb 10.4 oz (101.9 kg)      Gen. well built, alert and oriented lady, lying comfortably in her bed. Lungs. Clear bilaterally  CV. Regular rate and rhythm  Abdomen. Tense, distended, diffusely tender with positive fluid wave. Bowel sounds positive Extremities. 2+ pitting edema up to knees , dry scaly skin , few healed scars from previous weeping wounds , no active lesion. pulses symmetrical and present.  Labs. CBC Latest Ref Rng & Units 08/30/2016 08/29/2016 08/28/2016  WBC 4.0 - 10.5 K/uL 5.6 6.7 8.4  Hemoglobin 12.0 - 15.0 g/dL 11.7(L) 13.0 12.7  Hematocrit 36.0 - 46.0 % 35.5(L) 37.7 37.4  Platelets 150 - 400 K/uL 120(L) 100(L) 126(L)   BMP Latest Ref Rng & Units 08/30/2016 08/29/2016 08/28/2016  Glucose 65 - 99 mg/dL 113(H) 106(H) 149(H)  BUN 6 - 20 mg/dL <5(L) <5(L) <5(L)  Creatinine 0.44 - 1.00 mg/dL 0.57 0.74 0.87  BUN/Creat Ratio 9 - 23 - - -  Sodium 135 - 145 mmol/L 136 133(L) 133(L)  Potassium 3.5 - 5.1 mmol/L 3.4(L) 3.6 2.4(LL)  Chloride 101 - 111 mmol/L 106 100(L) 97(L)  CO2 22 - 32 mmol/L 23 24 24   Calcium 8.9 - 10.3 mg/dL 7.4(L) 7.8(L) 8.0(L)   Body fluid culture ( includes gram stain)  Order: [id number]  Status:  Preliminary result Visible to patient:  No (Not Released) Next appt:  None   1d ago  Specimen Description FLUID PERITONEAL   Special Requests NONE   Gram Stain MODERATE WBC PRESENT, PREDOMINANTLY MONONUCLEAR NO ORGANISMS SEEN   Culture PENDING        Body fluid cell count with differential  Order: [id number]  Status:  Edited Result - FINAL  Visible to patient:  No (Not Released) Next appt:  None    Ref Range & Units 1d ago 88yr ago   Fluid Type-FCT  PERITONEALVC  Peritoneal   Comments: FLUID  CORRECTED ON 11/02 AT 1721: PREVIOUSLY REPORTED AS Peritoneal    Color, Fluid YELLOW STRAW   YELLOW     Appearance, Fluid CLEAR CLEAR  HAZY     WBC, Fluid 0 - 1,000 cu mm 126  85    Neutrophil Count, Fluid 0 - 25 % 7  3    Lymphs, Fluid % 62  36    Monocyte-Macrophage-Serous Fluid 50 - 90 % 31   61    Eos, Fluid % 0     Other Cells, Fluid % 0    Resulting Agency           Assessment/Plan:  55 year old female with MHx significant for cirrhosis secondary to hepatitis C (complicated by thrombocytopenia, esophageal varices and hepatic encephalopathy), COPD on nightly 2L oxygen therapy, hypertension and cervical cancer s/p TAH who presents for evaluation of a one-week history of abdominal pain and fullness, nausea and weeping leg wounds.  CirrhosisComplicated by Portal Hypertension/Esophageal Varicies, History of Hepatitis C and Alcoholism. Her diagnostic paracentesis done yesterday does not look infected. Her  abdomen was more tense today. She just had one small bowel movement this morning. -Add MiraLAX to her regimen to have more bowel movements. -Ultrasound-guided therapeutic paracentesis needs to be done today. -Continue Lactulose 20 g 3 times a day. -Continue Rifaximin 550 mg twice daily -Continue spironolactone 50 mg daily -Continue Lasix 20 mg twice daily -Continue Propranolol 20 mg daily.  Altered Mental Status, Likely Hepatic Encephalopathy. She was alert and oriented when seen this morning. She will need more aggressive regimen to have 2-3 bowel movements every day. -Recheck ammonia level tomorrow.  History of frequent falls. His PT and OT recommending home health.  Hypokalemia. She again had mild hypokalemia with potassium of 3.4 today. -Replace potassium -Recheck BMP tomorrow.  COPD. she has no symptoms  suggesting any exacerbation. She is on 2 L home oxygen she states that she mostly uses at night,. -Continue current therapy with Albuterol PRN, Dulera and Spiriva.   Depression and Anxiety Continue home medications of Doxepin, Hydroxyzine, Ativan 1 mg BID PRN and Trazodone QHS,   Positive FOBT. She needs a outpatient referral to GI. For repeat EGD and colonoscopy. Her last EGD was in 2012 and colonoscopy was in 2010, with a recommendation to repeat in 5 years because of multiple polyps.   Dispo: Anticipated discharge in approximately 1 day(s).   Lorella Nimrod, MD 08/30/2016, 10:53 AM Pager: [phone]

## 2016-08-30 NOTE — Progress Notes (Signed)
[name], female   DOB: [birth year], 55 y.o.   MRN: [id number]   US paracentesis request received in Radiology  Pt had 35 cc drawn in ED yesterday and sent for labs Large volume requested today  BP 88/52 in room BP 73/46 in Rad       No change in BP---remaining in 70s/40s  Called Dr Albertina Sen[place] safely perform [paracentesis at this BP level  Procedure cancelled today Could re attempt when BP better

## 2016-08-30 NOTE — Progress Notes (Signed)
Occupational Therapy Treatment Patient Details Name: [name] [name] MRN: [id number] DOB: 11-Jan-1961 Today's Date: 08/30/2016    History of present illness This is a 55 year old female with MHx significant for cirrhosis secondary to hepatitis C (complicated by thrombocytopenia, esophageal varices and hepatic encephalopathy), COPD on nightly 2L oxygen therapy, hypertension and cervical cancer s/p TAH who presents for evaluation of a one-week history of abdominal pain and fullness, nausea and weeping leg wounds. Her husband who was present during ED examination reported she's had confusion and worsening memory for the past month. In regards to the patient's leg wounds, she reports a one-week history of weeping, painful draining leg wounds. She reports she has 1-2 bowel movements a day however frequently is unable to make it to the commode secondary to weakness and dizziness when walking causing frequent falls. The patient was subsequently admitted to the IMTS for evaluation of hypokalemia and decompensated cirrhosis.    OT comments  Pt making progress in OT goals today. Pt educated and practice lower body dressing with AE kit. Pt exclaimed "This is the first time I've put on my sock by myself in a long time" Pt provided with AE kit. OT will continue to follow acutely to maximize independence in ADL.  Follow Up Recommendations  No OT follow up;Supervision - Intermittent    Equipment Recommendations  3 in 1 bedside comode (AE KIT Provided on 08/30/16)    Recommendations for Other Services      Precautions / Restrictions Precautions Precautions: Fall Precaution Comments: History of Falls Restrictions Weight Bearing Restrictions: No       Mobility Bed Mobility Overal bed mobility: Needs Assistance Bed Mobility: Sidelying to Sit;Sit to Supine     Supine to sit: Mod assist;HOB elevated Sit to supine: Max assist;HOB elevated   General bed mobility comments: verbal cues to sit EOB and mod  physical assist, Pt able to tolerate for 4 min of AE education and practice and then suddenly "I can't take this anymore" and max assist with BLE to get back in bed and verbal cues and max assist for bed positioning   Transfers                 General transfer comment: No OOB attempted due to pain    Balance Overall balance assessment: Needs assistance Sitting-balance support: Feet supported;Single extremity supported Sitting balance-Leahy Scale: Poor                             ADL Overall ADL's : Needs assistance/impaired             Lower Body Bathing: Min guard;With adaptive equipment;Sitting/lateral leans Lower Body Bathing Details (indicate cue type and reason): Pt provided, and practiced with long handle sponge     Lower Body Dressing: Minimal assistance;With adaptive equipment;With caregiver independent assisting;Sit to/from stand Lower Body Dressing Details (indicate cue type and reason): practiced with AE kit (sock donner, grabber, and educated on shoe horn) Pt exclaimed "I haven't put my own sock on in a long time"               General ADL Comments: Pt limited by pain today, no transfers attempted, but really enjoyed the AE education and practicing using AE for lower body dressing      Vision                     Perception     Praxis  Cognition   Behavior During Therapy: WFL for tasks assessed/performed;Flat affect Overall Cognitive Status: Within Functional Limits for tasks assessed                  General Comments: Per husband, decline in mental health over past month.    Extremity/Trunk Assessment               Exercises     Shoulder Instructions       General Comments      Pertinent Vitals/ Pain       Pain Assessment: 0-10 Pain Score: 8  Faces Pain Scale: Hurts a little bit Pain Location: abdomen Pain Descriptors / Indicators: Discomfort;Grimacing Pain Intervention(s): Limited activity  within patient's tolerance;Monitored during session  Home Living                                          Prior Functioning/Environment              Frequency  Min 3X/week        Progress Toward Goals  OT Goals(current goals can now be found in the care plan section)  Progress towards OT goals: Progressing toward goals  Acute Rehab OT Goals Patient Stated Goal: Go home OT Goal Formulation: With patient Time For Goal Achievement: 09/12/16 Potential to Achieve Goals: Good  Plan Discharge plan remains appropriate    Co-evaluation                 End of Session Equipment Utilized During Treatment: Oxygen (AE kit (provided for indiginent Pt))   Activity Tolerance Patient limited by pain   Patient Left in bed;with call bell/phone within reach   Nurse Communication Mobility status        Time: 5885-0277 OT Time Calculation (min): 15 min  Charges: OT General Charges $OT Visit: 1 Procedure OT Treatments $Self Care/Home Management : 8-22 mins  Merri Ray Mat Stuard 08/30/2016, 4:01 PM Hulda Humphrey OTR/L [phone]

## 2016-08-30 NOTE — Progress Notes (Signed)
PT Cancellation Note  Patient Details Name: [name] [name] MRN: [id number] DOB: 04/23/1961   Cancelled Treatment:    Reason Eval/Treat Not Completed: Fatigue/lethargy limiting ability to participate;Patient declined, no reason specified.  Attempted to see patient x2.  Patient declined with maximal encouragement.   Despina Pole 08/30/2016, 7:33 PM Carita Pian. Sanjuana Kava, [place] Pager [phone]

## 2016-08-31 DIAGNOSIS — Z9181 History of falling: Secondary | ICD-10-CM

## 2016-08-31 DIAGNOSIS — J449 Chronic obstructive pulmonary disease, unspecified: Secondary | ICD-10-CM

## 2016-08-31 DIAGNOSIS — E876 Hypokalemia: Secondary | ICD-10-CM

## 2016-08-31 DIAGNOSIS — I8510 Secondary esophageal varices without bleeding: Secondary | ICD-10-CM

## 2016-08-31 DIAGNOSIS — D6959 Other secondary thrombocytopenia: Secondary | ICD-10-CM

## 2016-08-31 DIAGNOSIS — K766 Portal hypertension: Secondary | ICD-10-CM

## 2016-08-31 DIAGNOSIS — F1021 Alcohol dependence, in remission: Secondary | ICD-10-CM

## 2016-08-31 DIAGNOSIS — K7031 Alcoholic cirrhosis of liver with ascites: Principal | ICD-10-CM

## 2016-08-31 DIAGNOSIS — K7290 Hepatic failure, unspecified without coma: Secondary | ICD-10-CM

## 2016-08-31 DIAGNOSIS — R195 Other fecal abnormalities: Secondary | ICD-10-CM

## 2016-08-31 DIAGNOSIS — Z9981 Dependence on supplemental oxygen: Secondary | ICD-10-CM

## 2016-08-31 DIAGNOSIS — Z86010 Personal history of colonic polyps: Secondary | ICD-10-CM

## 2016-08-31 DIAGNOSIS — F418 Other specified anxiety disorders: Secondary | ICD-10-CM

## 2016-08-31 DIAGNOSIS — B182 Chronic viral hepatitis C: Secondary | ICD-10-CM

## 2016-08-31 DIAGNOSIS — Z7951 Long term (current) use of inhaled steroids: Secondary | ICD-10-CM

## 2016-08-31 LAB — CBC
HCT: 37.3 % (ref 36.0–46.0)
Hemoglobin: 12.3 g/dL (ref 12.0–15.0)
MCH: 32 pg (ref 26.0–34.0)
MCHC: 33 g/dL (ref 30.0–36.0)
MCV: 97.1 fL (ref 78.0–100.0)
PLATELETS: 110 10*3/uL — AB (ref 150–400)
RBC: 3.84 MIL/uL — ABNORMAL LOW (ref 3.87–5.11)
RDW: 18.9 % — AB (ref 11.5–15.5)
WBC: 8.4 10*3/uL (ref 4.0–10.5)

## 2016-08-31 LAB — AMMONIA: AMMONIA: 34 umol/L (ref 9–35)

## 2016-08-31 LAB — BASIC METABOLIC PANEL
Anion gap: 3 — ABNORMAL LOW (ref 5–15)
CALCIUM: 7.6 mg/dL — AB (ref 8.9–10.3)
CO2: 25 mmol/L (ref 22–32)
Chloride: 105 mmol/L (ref 101–111)
Creatinine, Ser: 0.68 mg/dL (ref 0.44–1.00)
GFR calc Af Amer: >60 mL/min (ref >60)
GLUCOSE: 104 mg/dL — AB (ref 65–99)
POTASSIUM: 3.1 mmol/L — AB (ref 3.5–5.1)
SODIUM: 133 mmol/L — AB (ref 135–145)

## 2016-08-31 MED ORDER — POTASSIUM CHLORIDE CRYS ER 20 MEQ PO TBCR
20.0000 meq | EXTENDED_RELEASE_TABLET | Freq: Every day | ORAL | Status: DC
  Administered 2016-08-31 – 2016-09-01 (×2): 20 meq via ORAL
  Filled 2016-08-31: qty 1

## 2016-08-31 MED ORDER — SODIUM CHLORIDE 0.9 % IV SOLN
30.0000 meq | Freq: Once | INTRAVENOUS | Status: AC
  Administered 2016-08-31: 30 meq via INTRAVENOUS
  Filled 2016-08-31 (×2): qty 15

## 2016-08-31 NOTE — Progress Notes (Signed)
Subjective: Patient was complaining that she was feeling cold this morning. She was alert and oriented. She is having soft bowel movements now.  Objective:  Vital signs in last 24 hours: Vitals:   08/30/16 2015 08/30/16 2017 08/31/16 0500 08/31/16 0726  BP:  (!) 88/51 (!) 94/57   Pulse: 84 84 80 82  Resp: 16 16 16 17   Temp:  99.4 F (37.4 C) 98.7 F (37.1 C)   TempSrc:  Oral Oral   SpO2: 94% 92% 96% 92%  Weight:       Gen. well built, alert and oriented lady,lying with couple of blankets in her bed, in no acute distress  Lungs. Clear bilaterally  CV. Regular rate and rhythm  Abdomen. Tense, distended, diffusely tender with positive fluid wave. Bowel sounds positive Extremities. 2+ pitting edema up to knees ,dry scaly skin ,few healed scars from previous weeping wounds ,no active lesion.pulses symmetrical and present.  Labs. CBC Latest Ref Rng & Units 08/31/2016 08/30/2016 08/29/2016  WBC 4.0 - 10.5 K/uL 8.4 5.6 6.7  Hemoglobin 12.0 - 15.0 g/dL 12.3 11.7(L) 13.0  Hematocrit 36.0 - 46.0 % 37.3 35.5(L) 37.7  Platelets 150 - 400 K/uL 110(L) 120(L) 100(L)   CMP Latest Ref Rng & Units 08/31/2016 08/30/2016 08/29/2016  Glucose 65 - 99 mg/dL 104(H) 113(H) 106(H)  BUN 6 - 20 mg/dL <5(L) <5(L) <5(L)  Creatinine 0.44 - 1.00 mg/dL 0.68 0.57 0.74  Sodium 135 - 145 mmol/L 133(L) 136 133(L)  Potassium 3.5 - 5.1 mmol/L 3.1(L) 3.4(L) 3.6  Chloride 101 - 111 mmol/L 105 106 100(L)  CO2 22 - 32 mmol/L 25 23 24   Calcium 8.9 - 10.3 mg/dL 7.6(L) 7.4(L) 7.8(L)  Total Protein 6.5 - 8.1 g/dL - 4.8(L) 5.6(L)  Total Bilirubin 0.3 - 1.2 mg/dL - 3.1(H) 4.8(H)  Alkaline Phos 38 - 126 U/L - 126 116  AST 15 - 41 U/L - 52(H) 61(H)  ALT 14 - 54 U/L - 21 23   Ammonia.  52  (decreased from 49 3 days ago)  Assessment/Plan:  55 year old female with MHx significant for cirrhosis secondary to hepatitis C (complicated by thrombocytopenia, esophageal varices and hepatic encephalopathy), COPD on nightly 2L  oxygen therapy, hypertension and cervical cancer s/p TAH who presents for evaluation of a one-week history of abdominal pain and fullness, nausea and weeping leg wounds.  CirrhosisComplicated by Portal Hypertension/Esophageal Varicies, History of Hepatitis C and Alcoholism. She is alert and oriented now. Unable to do a therapeutic paracentesis yesterday because of softer blood pressure. -We'll try therapeutic paracentesis today. -Continue lactulose 20 g 3 times a day -Continue Rifaximin 550 mg twice daily -Continue spironolactone 50 mg daily -Continue Lasix 20 mg twice daily -Continue Propranolol 20 mg daily.  Altered Mental Status, Likely Hepatic Encephalopathy. She was alert and oriented. Her ammonia level has decreased to 34 from 49 on admission. She needs to have 2-3 soft bowel movements every day to keep her ammonia level under control.  Hypokalemia. Her potassium dropped as 3.1 today despite of replacement. -Replace with 30 meq IV -K Dur 20 Meq daily oral.  History of frequent falls. His PT and OT recommending home health.  COPD.she has no symptoms suggesting any exacerbation.She is on 2 L home oxygen she states that she mostly uses at night,. -Continue current therapy with Albuterol PRN, Dulera and Spiriva.   Depression and Anxiety Continue home medications of Doxepin, Hydroxyzine, Ativan 1 mg BID PRNand Trazodone QHS,   Positive FOBT.She needs a outpatient referral to  GI.For repeat EGD and colonoscopy. Her last EGD was in 2012 and colonoscopy was in 2010,with a recommendation to repeat in 5 years because of multiple polyps.  Dispo: Can be discharged today after therapeutic paracentesis.  Lorella Nimrod, MD 08/31/2016, 12:55 PM Pager: [phone]

## 2016-08-31 NOTE — Progress Notes (Signed)
Physical Therapy Treatment Patient Details Name: [name] [name] MRN: [id number] DOB: 1960/11/16 Today's Date: 08/31/2016    History of Present Illness This is a 55 year old female with MHx significant for cirrhosis secondary to hepatitis C (complicated by thrombocytopenia, esophageal varices and hepatic encephalopathy), COPD on nightly 2L oxygen therapy, hypertension and cervical cancer s/p TAH who presents for evaluation of a one-week history of abdominal pain and fullness, nausea and weeping leg wounds. Her husband who was present during ED examination reported she's had confusion and worsening memory for the past month. In regards to the patient's leg wounds, she reports a one-week history of weeping, painful draining leg wounds. She reports she has 1-2 bowel movements a day however frequently is unable to make it to the commode secondary to weakness and dizziness when walking causing frequent falls. The patient was subsequently admitted to the IMTS for evaluation of hypokalemia and decompensated cirrhosis.     PT Comments    Patient is making gradual progress toward mobility goals. Ambulated ~16ft to recliner this session. Required max A for bed mobility but min guard/min A for OOB transfers and gait. Limited gait due to frequent diarrhea. Continue to progress as tolerated.   Follow Up Recommendations  Home health PT;Supervision for mobility/OOB     Equipment Recommendations  3in1 (PT)    Recommendations for Other Services       Precautions / Restrictions Precautions Precautions: Fall Precaution Comments: History of Falls Restrictions Weight Bearing Restrictions: No    Mobility  Bed Mobility Overal bed mobility: Needs Assistance Bed Mobility: Supine to Sit;Rolling     Supine to sit: Max assist;HOB elevated     General bed mobility comments: cues for sequencing and technique; assist to mobilize bilat LE, elevate trunk into sitting, and scoot hips to EOB with use of bed pad; use  of rail; HOB elevated  Transfers Overall transfer level: Needs assistance Equipment used: Rolling walker (2 wheeled) Transfers: Sit to/from [facility] Sit to Stand: Min assist;From elevated surface Stand pivot transfers: Min assist       General transfer comment: cues for hand placement; assist to power up into standing and to steady upon stand and when pivoting EOB to [facility]  Ambulation/Gait Ambulation/Gait assistance: Min guard Ambulation Distance (Feet): 3 Feet Assistive device: Rolling walker (2 wheeled) Gait Pattern/deviations: Step-through pattern;Decreased stride length;Wide base of support     General Gait Details: cues for safety; min guard; no use of AD to ambulate BSC to recliner; limited gait this session due to frequent diarrhea   Stairs            Wheelchair Mobility    Modified Rankin (Stroke Patients Only)       Balance     Sitting balance-Leahy Scale: Poor       Standing balance-Leahy Scale: Fair                      Cognition Arousal/Alertness: Awake/alert Behavior During Therapy: WFL for tasks assessed/performed;Flat affect Overall Cognitive Status: Impaired/Different from baseline Area of Impairment: Attention;Memory;Problem solving;Following commands   Current Attention Level: Sustained Memory: Decreased short-term memory Following Commands: Follows one step commands with increased time     Problem Solving: Slow processing;Requires tactile cues;Requires verbal cues      Exercises      General Comments General comments (skin integrity, edema, etc.): husband present for session      Pertinent Vitals/Pain Pain Assessment: Faces Faces Pain Scale: Hurts little more Pain Location: bilat  LE and abdomen  Pain Descriptors / Indicators: Aching;Sore Pain Intervention(s): Limited activity within patient's tolerance;Monitored during session;Premedicated before session;Repositioned    Home Living                       Prior Function            PT Goals (current goals can now be found in the care plan section) Acute Rehab PT Goals Patient Stated Goal: Go home PT Goal Formulation: With patient/family Time For Goal Achievement: 09/05/16 Potential to Achieve Goals: Fair Progress towards PT goals: Progressing toward goals    Frequency    Min 3X/week      PT Plan Current plan remains appropriate    Co-evaluation             End of Session Equipment Utilized During Treatment: Gait belt;Oxygen Activity Tolerance: No increased pain;Patient limited by fatigue;Patient limited by lethargy Patient left: with call bell/phone within reach;with family/visitor present;in chair     Time: [id number] PT Time Calculation (min) (ACUTE ONLY): 39 min  Charges:  $Gait Training: 8-22 mins $Therapeutic Activity: 23-37 mins                    G Codes:      [place] April, PTA Pager: [phone]   08/31/2016, 3:58 PM

## 2016-08-31 NOTE — Care Management (Signed)
Case manager received consult for Home Health therapies. Unfortunately, after speaking with [place],  under medicaid guidelines patient does not qualify for these services. CM will continue to follow.

## 2016-09-01 ENCOUNTER — Inpatient Hospital Stay (HOSPITAL_COMMUNITY): Payer: Medicaid Other

## 2016-09-01 LAB — BODY FLUID CULTURE: Culture: NO GROWTH

## 2016-09-01 LAB — CBC
HEMATOCRIT: 33.2 % — AB (ref 36.0–46.0)
HEMOGLOBIN: 11.2 g/dL — AB (ref 12.0–15.0)
MCH: 32.5 pg (ref 26.0–34.0)
MCHC: 33.7 g/dL (ref 30.0–36.0)
MCV: 96.2 fL (ref 78.0–100.0)
Platelets: 82 10*3/uL — ABNORMAL LOW (ref 150–400)
RBC: 3.45 MIL/uL — ABNORMAL LOW (ref 3.87–5.11)
RDW: 18.4 % — AB (ref 11.5–15.5)
WBC: 9.2 10*3/uL (ref 4.0–10.5)

## 2016-09-01 LAB — BASIC METABOLIC PANEL
ANION GAP: 5 (ref 5–15)
BUN: 6 mg/dL (ref 6–20)
CALCIUM: 7.1 mg/dL — AB (ref 8.9–10.3)
CO2: 21 mmol/L — ABNORMAL LOW (ref 22–32)
Chloride: 106 mmol/L (ref 101–111)
Creatinine, Ser: 0.78 mg/dL (ref 0.44–1.00)
GFR calc Af Amer: >60 mL/min (ref >60)
Glucose, Bld: 153 mg/dL — ABNORMAL HIGH (ref 65–99)
POTASSIUM: 3.4 mmol/L — AB (ref 3.5–5.1)
SODIUM: 132 mmol/L — AB (ref 135–145)

## 2016-09-01 MED ORDER — LACTULOSE 10 GM/15ML PO SOLN: 20.0000 g | Freq: Three times a day (TID) | ORAL | 1 refills | Status: DC

## 2016-09-01 MED ORDER — POTASSIUM CHLORIDE CRYS ER 20 MEQ PO TBCR: 20.0000 meq | EXTENDED_RELEASE_TABLET | Freq: Every day | ORAL | 0 refills | Status: DC

## 2016-09-01 MED ORDER — LIDOCAINE HCL (PF) 1 % IJ SOLN
INTRAMUSCULAR | Status: AC
  Filled 2016-09-01: qty 10

## 2016-09-01 NOTE — Progress Notes (Signed)
Pt has had three diarrhea stools. MD notified. HS dose of lactulose held. Pt's BP remains soft with systolic BP in [id number].  O2 sat 90% on room air.

## 2016-09-01 NOTE — Discharge Summary (Signed)
Name: [name] [name] MRN: [id number] DOB: 09/19/1961 55 y.o. PCP: [name] Loll, MD  Date of Admission: 08/28/2016  4:10 PM Date of Discharge: 09/01/2016 Attending Physician: Dr. Bobby [name]  Discharge Diagnosis: Principal Problem:   Hepatic encephalopathy ([place]) Active Problems:   Chronic viral hepatitis C ([place])   Chronic obstructive bronchitis ([facility])   Anxiety   Chronic pain   Essential hypertension, benign   Esophageal varices in cirrhosis ([facility])   Hyponatremia   Hypokalemia   Discharge Medications:   Medication List    STOP taking these medications   doxepin 25 MG capsule Commonly known as:  SINEQUAN   Melatonin 1 MG Tabs   meloxicam 7.5 MG tablet Commonly known as:  MOBIC   nicotine 21 mg/24hr patch Commonly known as:  NICODERM CQ - dosed in mg/24 hours   nicotine polacrilex 2 MG lozenge Commonly known as:  [facility] NICOTINE     TAKE these medications   albuterol (2.5 MG/3ML) 0.083% nebulizer solution Commonly known as:  PROVENTIL TAKE THREE MLS (2.5 MG TOTAL) BY NEBULIZATION EVERY 6 (SIX) HOURS AS NEEDED FOR WHEEZING OR SHORTNESS OF BREATH. What changed:  Another medication with the same name was removed. Continue taking this medication, and follow the directions you see here.   busPIRone 15 MG tablet Commonly known as:  BUSPAR 1 qam  2  qpm What changed:  how much to take  how to take this  when to take this  additional instructions   DULERA 200-5 MCG/ACT [facility]ero Generic drug:  mometasone-formoterol INHALE TWO PUFFS INTO THE LUNGS TWO TIMES DAILY.   fluticasone 50 MCG/ACT nasal spray Commonly known as:  FLONASE PLACE 1 SPRAY INTO BOTH NOSTRILS DAILY.   furosemide 20 MG tablet Commonly known as:  LASIX TAKE 1 TABLET (20 MG TOTAL) BY MOUTH TWO   TIMES DAILY.   hydrOXYzine 25 MG tablet Commonly known as:  ATARAX/VISTARIL 1  tid What changed:  how much to take  how to take this  when to take this  additional instructions   lactulose  10 GM/15ML solution Commonly known as:  CHRONULAC Take 30 mLs (20 g total) by mouth 3 (three) times daily.   LORazepam 1 MG tablet Commonly known as:  ATIVAN 1 bid What changed:  how much to take  how to take this  when to take this  additional instructions   pantoprazole 40 MG tablet Commonly known as:  PROTONIX Take 1 tablet (40 mg total) by mouth daily.   potassium chloride SA 20 MEQ tablet Commonly known as:  K-DUR,KLOR-CON Take 1 tablet (20 mEq total) by mouth daily.   propranolol 20 MG tablet Commonly known as:  INDERAL TAKE 1 TABLET (20 MG TOTAL) BY MOUTH DAILY.   rifaximin 550 MG Tabs tablet Commonly known as:  XIFAXAN TAKE 1 TABLET (550 MG TOTAL) BY MOUTH TWO   (TWO) TIMES DAILY.   SPIRIVA HANDIHALER 18 MCG inhalation capsule Generic drug:  tiotropium PLACE 1 CAPSULE (18 MCG TOTAL) INTO INHALER AND INHALE DAILY.   spironolactone 50 MG tablet Commonly known as:  ALDACTONE Take 1 tablet (50 mg total) by mouth daily.   traMADol 50 MG tablet Commonly known as:  ULTRAM TAKE 1 TABLET BY MOUTH EVERY 6 HOUR AS NEEDED FOR SEVERE PAIN   traZODone 50 MG tablet Commonly known as:  DESYREL Take 1 tablet (50 mg total) by mouth at bedtime.   VOLTAREN 1 % Gel Generic drug:  diclofenac sodium APPLY 4 G TOPICALLY 4 (FOUR)  TIMES DAILY.            Durable Medical Equipment        Start     Ordered   09/01/16 0000  DME Bedside commode     09/01/16 1536      Disposition and follow-up:   [name] was discharged from [facility] in [place] condition.  At the hospital follow up visit please address:  1.  Hepatic encephalopathy: Her primary admission diagnosis with what sounds like grade 1 or 2 developing for weeks preceding admission while not taking adequate medication at home. She is reluctant in use of lactulose TID so please assess her compliance or issues in this.  2.  Decompensated liver cirrhosis: She presented with moderate  hypotension which appears new or worsened compared to previous clinic vital signs. Abdominal ascites is pretty significant but increasing diuretics may be limited by BP. This is a very poor prognostic indicator.  3. Hypokalemia: 55mEq oral supplementation was added to home regimen after last hospitalization. Diuretic doses not adjusted.  4. FOBT positive: She is overdue for recommended follow up endoscopy with known variceal disease.  5.  Labs / imaging needed at time of follow-up: [facility]  Follow-up Appointments:   Hospital Course by problem list: Principal Problem:   Hepatic encephalopathy ([place]) Active Problems:   Chronic viral hepatitis C ([place])   Chronic obstructive bronchitis ([facility])   Anxiety   Chronic pain   Essential hypertension, benign   Esophageal varices in cirrhosis ([facility])   Hyponatremia   Hypokalemia   Hepatic encephalopathy: Her mental status improved to her baseline according to husband within 2 days of admission. She required 20mg  TID of lactulose to achieve increase in stooling.  Cirrhosis2/2 HCV s/p Rx and EtOH use, Complicated by Portal Hypertension, Esophageal Varicies, hepatic encephalopathy, ascites, hypotension: Paracentesis was performed over RUQ which as negative for evidence of SBP. Subsequently therapeutic paracentesis was performed prior to d/c removing 3.5L of fluid. There were no major complications although she remained moderately hypotensive throughout the admission.  Hypokalemia: Presented with K of 2.4. Most likely related to her diuretics and some decreased PO intake. She required daily supplementation so oral 58mEq potassium was added to regimen at discharge.  COPD.Not in acute exacerbation.She is on 2 L home oxygen she states that she mostly uses at night, she remained on this rate of supplemental oxygen  Depression and Anxiety: Home medications were continued Doxepin, Hydroxyzine, Ativan PRNand Trazodone  Positive FOBT: No evidence of  significant GI bleeding noted.She needs a outpatient referral to GI.For repeat EGD and colonoscopy. Her last EGD was in 2012 and colonoscopy was in 2010,with a recommendation to repeat in 5 years because of multiple polyps. If intervention is possible at varices may also decrease the need for nonselective beta-blockers as prophylaxis which could help her hypotension.   Discharge Vitals:   BP (!) 89/57 (BP Location: Right Arm)   Pulse 86   Temp 98.6 F (37 C) (Oral)   Resp 18   Wt 230 lb 6.1 oz (104.5 kg)   SpO2 93%   BMI 39.54 kg/m   Pertinent Labs, Studies, and Procedures:  Paracentesis of RUQ ascites collection. Fluid analysis was negative for SBP and subsequently 3.5L removed for therapeutic paracentesis without complication.  Discharge Instructions: Discharge Instructions    Call MD for:  difficulty breathing, headache or visual disturbances    Complete by:  As directed    Call MD for:  persistant dizziness or light-headedness  Complete by:  As directed    Call MD for:  persistant nausea and vomiting    Complete by:  As directed    Call MD for:  redness, tenderness, or signs of infection (pain, swelling, redness, odor or green/yellow discharge around incision site)    Complete by:  As directed    Call MD for:  severe uncontrolled pain    Complete by:  As directed    DME Bedside commode    Complete by:  As directed    Diet - low sodium heart healthy    Complete by:  As directed    Discharge instructions    Complete by:  As directed    After returning home it is important to resume activity slowly and using your walker for support when moving about.  Any dressing on your paracentesis site should be removed by 1 day. If you notice continued leaking past that time please call the clinic for recommendations.  It is important to follow up and make sure your symptoms are doing okay. Please arrange for a clinic appointment Thursday or Friday afternoon (11/9-11/10). You should  also receive a phone call reminding you about this as it is very important.   Increase activity slowly    Complete by:  As directed       Signed: Collier [place], MD PGY-II Internal Medicine Resident Pager# [phone] 09/04/2016, 8:07 PM

## 2016-09-01 NOTE — Procedures (Signed)
Successful US guided paracentesis from right lateral abdomen.  Yielded 3.5 liters of clear yellow fluid.  No immediate complications.  Pt tolerated well.   Tamarick Kovalcik S Deston Bilyeu PA-C 09/01/2016 11:50 AM

## 2016-09-01 NOTE — Progress Notes (Signed)
Physical Therapy Treatment Patient Details Name: [name] [name] MRN: [id number] DOB: Apr 10, 1961 Today's Date: 09/01/2016    History of Present Illness This is a 55 year old female with MHx significant for cirrhosis secondary to hepatitis C (complicated by thrombocytopenia, esophageal varices and hepatic encephalopathy), COPD on nightly 2L oxygen therapy, hypertension and cervical cancer s/p TAH who presents for evaluation of a one-week history of abdominal pain and fullness, nausea and weeping leg wounds. Her husband who was present during ED examination reported she's had confusion and worsening memory for the past month. In regards to the patient's leg wounds, she reports a one-week history of weeping, painful draining leg wounds. She reports she has 1-2 bowel movements a day however frequently is unable to make it to the commode secondary to weakness and dizziness when walking causing frequent falls. The patient was subsequently admitted to the IMTS for evaluation of hypokalemia and decompensated cirrhosis.     PT Comments    She was out of room upon initial attempt for test, returned 1210-1222 and assisted pt OOB to recliner with set up for lunch.  Continued treatment in afternoon.  Pt needs max encouragement to participate in therapy.  She apparently has 2 walkers at home but does not use, husband reports she only amb in their home and is independent in her own environment.  Will continue to follow patient while on this venue of care to progress mobility.   Follow Up Recommendations  Home health PT;Supervision for mobility/OOB     Equipment Recommendations  3in1 (PT)    Recommendations for Other Services       Precautions / Restrictions Precautions Precautions: Fall Precaution Comments: History of Falls Restrictions Weight Bearing Restrictions: No    Mobility  Bed Mobility Overal bed mobility: Needs Assistance Bed Mobility: Supine to Sit;Sit to Supine     Supine to sit: Mod  assist Sit to supine: Min assist   General bed mobility comments: use of Rt rail and Lt HHA, min assist to return to bed to manage legs  Transfers Overall transfer level: Needs assistance Equipment used: 1 person hand held assist Transfers: Sit to/from Stand Sit to Stand: Min assist Stand pivot transfers: Min assist       General transfer comment: min cues for hand placement, reliant on external support during stand pivot transfer to recliner  Ambulation/Gait Ambulation/Gait assistance: Min assist Ambulation Distance (Feet): 40 Feet Assistive device: 1 person hand held assist Gait Pattern/deviations: Shuffle;Wide base of support;Decreased dorsiflexion - right;Decreased dorsiflexion - left     General Gait Details: pt declined use of RW, left HHA, max encouragement to participate   Stairs            Wheelchair Mobility    Modified Rankin (Stroke Patients Only)       Balance Overall balance assessment: Needs assistance Sitting-balance support: No upper extremity supported;Feet supported Sitting balance-Leahy Scale: Fair     Standing balance support: Single extremity supported Standing balance-Leahy Scale: Poor                      Cognition Arousal/Alertness: Awake/alert Behavior During Therapy: WFL for tasks assessed/performed Overall Cognitive Status: Within Functional Limits for tasks assessed                      Exercises      General Comments General comments (skin integrity, edema, etc.): pt's husband in room during session, attempted to [facility] pt 1210-1222, assist to chair with set  up for lunch, out of room for testing prior to then      Pertinent Vitals/Pain Pain Assessment: Faces Faces Pain Scale: Hurts little more Pain Location: abdomen Pain Descriptors / Indicators: Aching;Tightness Pain Intervention(s): Limited activity within patient's tolerance;Monitored during session    Home Living                       Prior Function            PT Goals (current goals can now be found in the care plan section) Acute Rehab PT Goals Patient Stated Goal: take a nap PT Goal Formulation: With patient/family Time For Goal Achievement: 09/05/16 Potential to Achieve Goals: Fair Progress towards PT goals: Progressing toward goals    Frequency    Min 3X/week      PT Plan Current plan remains appropriate    Co-evaluation             End of Session Equipment Utilized During Treatment: Gait belt Activity Tolerance: Patient tolerated treatment well;No increased pain Patient left: in bed;with call bell/phone within reach;with bed alarm set;with family/visitor present     Time: 1352 (also seen 1210-1222)-1412 (also seen 1210-1222) PT Time Calculation (min) (ACUTE ONLY): 20 min  Charges:  $Gait Training: 8-22 mins $Therapeutic Activity: 8-22 mins                    G CodesMalka So, PT [phone]  [place] 09/01/2016, 3:10 PM

## 2016-09-01 NOTE — Progress Notes (Signed)
Occupational Therapy Treatment Patient Details Name: [name] [name] MRN: [id number] DOB: 08-Jan-1961 Today's Date: 09/01/2016    History of present illness This is a 55 year old female with MHx significant for cirrhosis secondary to hepatitis C (complicated by thrombocytopenia, esophageal varices and hepatic encephalopathy), COPD on nightly 2L oxygen therapy, hypertension and cervical cancer s/p TAH who presents for evaluation of a one-week history of abdominal pain and fullness, nausea and weeping leg wounds. Her husband who was present during ED examination reported she's had confusion and worsening memory for the past month. In regards to the patient's leg wounds, she reports a one-week history of weeping, painful draining leg wounds. She reports she has 1-2 bowel movements a day however frequently is unable to make it to the commode secondary to weakness and dizziness when walking causing frequent falls. The patient was subsequently admitted to the IMTS for evaluation of hypokalemia and decompensated cirrhosis.    OT comments  Pt. And spouse  completed bed mobility and stand pivot transfer to recliner.  Focus was on family education with emphasis on safe body mechanics.  They were unable to incorporate these techniques with verbal and tactile guidance.  will continue reinforcing these at next session.    Follow Up Recommendations  No OT follow up;Supervision - Intermittent    Equipment Recommendations  3 in 1 bedside comode    Recommendations for Other Services      Precautions / Restrictions Precautions Precautions: Fall Precaution Comments: History of Falls Restrictions Weight Bearing Restrictions: No       Mobility Bed Mobility Overal bed mobility: Needs Assistance Bed Mobility: Supine to Sit Rolling: Mod assist         General bed mobility comments: pt. and spouse have "their way" of completing bed mobility.  spouse provides b ue support, they hold hands and he pulls her  forward managing b les and pulls her towards eob.  attempted safety inst. for both with focus on compensatory strategies and safe body mechanics.  they both declined and wanted to continue their way.  would not attempt log roll either.   Transfers Overall transfer level: Needs assistance Equipment used: 1 person hand held assist Transfers: Sit to/from [facility] Sit to Stand: Mod assist Stand pivot transfers: Min assist       General transfer comment: spouse still holding both hands and pulled pt. into standing and they pivoted to chair together.      Balance                                   ADL Overall ADL's : Needs assistance/impaired                         Toilet Transfer: Moderate assistance;Stand-pivot;Cueing for safety;Cueing for sequencing Toilet Transfer Details (indicate cue type and reason): simulated with eob to recliner, refused 3n1 and declined need for actual use of commode.  husband provided 2 hand held assist for transfer, no RW Toileting- Clothing Manipulation and Hygiene: Minimal assistance Toileting - Clothing Manipulation Details (indicate cue type and reason): simulated during transfer     Functional mobility during ADLs: Moderate assistance;Cueing for safety;Cueing for sequencing General ADL Comments: husband and pt. have "their way" of doing things.  he reports on her "good days" she requires no assistance but he knows he "bad days" and is able to assist as needed  Vision                     Perception     Praxis      Cognition   Behavior During Therapy: WFL for tasks assessed/performed Overall Cognitive Status: Within Functional Limits for tasks assessed                       Extremity/Trunk Assessment               Exercises     Shoulder Instructions       General Comments      Pertinent Vitals/ Pain       Pain Assessment:  (did not rate but c/o stomach pain when  transitioning into sitting) Pain Intervention(s): Monitored during session  Home Living                                          Prior Functioning/Environment              Frequency  Min 3X/week        Progress Toward Goals  OT Goals(current goals can now be found in the care plan section)  Progress towards OT goals: Progressing toward goals     Plan Discharge plan remains appropriate    Co-evaluation                 End of Session Equipment Utilized During Treatment: Gait belt   Activity Tolerance Patient tolerated treatment well   Patient Left in chair;with call bell/phone within reach;with family/visitor present   Nurse Communication          Time: [id number] OT Time Calculation (min): 18 min  Charges: OT General Charges $OT Visit: 1 Procedure OT Treatments $Self Care/Home Management : 8-22 mins  Janice Coffin, COTA/L 09/01/2016, 10:31 AM

## 2016-09-01 NOTE — Progress Notes (Signed)
   Subjective: [name] [name] is feeling improved this morning, eating breakfast comfortably. Her husband remains at bedside and agrees that her mental status seems much better than it has been at home.  Objective:  Vital signs in last 24 hours: Vitals:   09/01/16 0300 09/01/16 0426 09/01/16 0559 09/01/16 0838  BP:  (!) 93/51    Pulse:  86    Resp:  18    Temp:  98.6 F (37 C)    TempSrc:  Oral    SpO2: 90% 92%  93%  Weight:   230 lb 6.1 oz (104.5 kg)    GENERAL- alert, co-operative, NAD HEENT- Atraumatic, no JVD CARDIAC- RRR, no murmurs, rubs or gallops. RESP- CTAB, no wheezes or crackles, air movement throughout ABDOMEN- Large protuberent abdomen with tenderness to palpation, fluid wave, no appreciable drainage from prior paracentesis site NEURO- No obvious Cr N abnormality, strength upper and lower extremities- 5/5, Sensation intact globally EXTREMITIES- persistent b/l pedal pitting edema with overlying skin changes including scaling, compression hose to shin level SKIN- Friable skin with several bruises on arm extensor surfaces PSYCH- Affect remains somewhat flat, she is oriented    Assessment/Plan:  55 year old female with MHx significant for cirrhosis secondary to hepatitis C (complicated by thrombocytopenia, esophageal varices and hepatic encephalopathy), COPD on nightly 2L oxygen therapy, hypertension and cervical cancer s/p TAH who presents for evaluation of a one-week history of abdominal pain and fullness, nausea and weeping leg wounds.  Cirrhosis2/2 HCV s/p Rx and EtOH use, Complicated by Portal Hypertension, Esophageal Varicies, hepatic encephalopathy, ascites, hypotension She is alert and oriented now after titration up to 3 loose bowel movements yesterday for HE. She continues to have very lage ascites limiting mobility and work of breathing so we will try for therapeutic paracentesis today with [place]. Her BP remains consistently soft complicated by some episodes of  orthostatic dizziness so the benefit in variceal bleeding prophylaxis versus worsening these risk is unclear while her systemic pressure remains so low. -We'll try therapeutic paracentesis today. -Continue lactulose 20g 3 TID -Continue Rifaximin 550 mg BID -Continue spironolactone 50 mg daily -Continue Lasix 20 mg BID  Altered Mental Status, Likely Hepatic Encephalopathy. Improved since admission, now alert and oriented.  Hypokalemia. Likely exacerbated by her loop diuretic as well as decompensated liver failure. Will plan to d/c with daily oral supplementation.  History of frequent falls.His PT and OT recommending home health. Availability limited by insurance status. Her husband is near 24 hour supervision but health aide may be of benefit.  COPD.Not in acute exacerbation.She is on 2 L home oxygen she states that she mostly uses at night,. -Continue current therapy with Albuterol PRN, Dulera and Spiriva.   Depression and Anxiety Continue home medications of Doxepin, Hydroxyzine, Ativan 1 mg BID PRNand Trazodone QHS,   Positive FOBT.She needs a outpatient referral to GI.For repeat EGD and colonoscopy. Her last EGD was in 2012 and colonoscopy was in 2010,with a recommendation to repeat in 5 years because of multiple polyps. If intervention is possible at varices may also decrease the need for nonselective beta-blockers as prophylaxis which could help her hypotension.  Dispo: Anticipated discharge to home later today after therapeutic paracentesis.    Collier Salina, MD PGY-II Internal Medicine Resident Pager# [phone] 09/01/2016, 9:32 AM

## 2016-09-04 ENCOUNTER — Ambulatory Visit: Payer: Self-pay

## 2016-09-05 ENCOUNTER — Encounter: Payer: Self-pay | Admitting: Pulmonary Disease

## 2016-09-05 ENCOUNTER — Ambulatory Visit: Payer: Self-pay

## 2016-09-06 ENCOUNTER — Telehealth (HOSPITAL_COMMUNITY): Payer: Self-pay

## 2016-09-06 ENCOUNTER — Telehealth: Payer: Self-pay | Admitting: *Deleted

## 2016-09-06 NOTE — Telephone Encounter (Signed)
Please call pt daughter back  

## 2016-09-06 NOTE — Telephone Encounter (Signed)
F/U with pt - stated Dr Benjamine Mola had called her but her daughter wants to be called - stated pt did not understand totally what was told to her. Message given to Dr Benjamine Mola.

## 2016-09-06 NOTE — Telephone Encounter (Addendum)
Call rom pt's daughter, Lenna Sciara - states pt is having excessive drainage from where a tube was placed  in her liver.Soaking thru gauzes; has to use a towel. States her mother having difficulty moving around the house even going to the bathroom she needs help. She missed her appt yesterday. States they do not know what to do.

## 2016-09-06 NOTE — Telephone Encounter (Signed)
   Reason for call:   I received a call from Ms. Hannelore Heuring's Daughter Melissa at 1:32 PM indicating her mother was having continued fluid drainage from her right side soaking through several dressings and using a towel to absorb fluid.   Pertinent Data:   The patient underwent a therapeutic paracentesis on Sunday 11/5 for symptom relief of her large ascites.  This drainage continues to be transparent fluid that is slowly leaking. She thinks this has decreased in rate today but is not quantified.  She missed her clinic appointment on Thursday because of difficulty with transportation.   Assessment / Plan / Recommendations:   I spoke with the patient and her daughter each directly by phone. I recommended placing a gauze with vasoline or similar over the paracentesis site and covering with an occlusive dressing. If not available tape under moderate tension. I recommended the skin could be dried and wiped with an alcohol swab prior to placing this if there is any current fluid accumulation.  I recommended prompt reassessment at [facility] Monday or Tuesday whether there is continued drainage or not.  If this plan becomes for any reason I recommended calling the clinic again to discuss with on call resident.  Warning indications were given if they notice any fever, confusion, or development of inflammation around the drainage site to present to the ED over the weekend for immediate assessment. This would be concerning for a bacterial peritonitis secondary to her procedure.   Collier Salina, MD   09/06/2016, 5:37 PM

## 2016-09-06 NOTE — Telephone Encounter (Signed)
Talked to Dr Benjamine Mola who saw her in the hospital; said she had paracentesis x 2 done.

## 2016-09-06 NOTE — Telephone Encounter (Signed)
Patient is calling for a refill on medications. She has not been seen since May and has no showed 2 appointments. I called patient to discuss and she informed me she has been having a lot of health problems, she was recently released from the hospital and has a catheter in her liver. She is unable to really get around at this time, but said that she will make a follow up when she is feeling better. Do you want to refill? Please review and advise, thank you

## 2016-09-06 NOTE — Telephone Encounter (Signed)
Cirrhotic patient discharged from IMTS 5 days ago with apparent drainage from abdomen. I don't see any documentation of an indwelling tube in her liver. She did have a paracentesis that may have left a wound that is leaking. I spoke with Dr. Benjamine Mola who discharged the patient, he will call her and troubleshoot the drainage. I suggest trying vaseline soaked gauze with an occlusive bandage. Repeat paracentesis would help relieve the pressure if the abdomen is tight, can be arranged in the clinic, this afternoon or Monday potentially. Any worsening mental status would prompt concern for procedure related bacterial peritonitis and would need evaluation in the ED. High risk situation for re-admission.

## 2016-09-11 ENCOUNTER — Other Ambulatory Visit (HOSPITAL_COMMUNITY): Payer: Self-pay

## 2016-09-11 DIAGNOSIS — F331 Major depressive disorder, recurrent, moderate: Secondary | ICD-10-CM

## 2016-09-11 MED ORDER — HYDROXYZINE HCL 25 MG PO TABS: 25.0000 mg | ORAL_TABLET | Freq: Three times a day (TID) | ORAL | 0 refills | Status: DC

## 2016-09-11 MED ORDER — LORAZEPAM 1 MG PO TABS: 1.0000 mg | ORAL_TABLET | Freq: Two times a day (BID) | ORAL | 0 refills | Status: DC

## 2016-09-11 MED ORDER — TRAZODONE HCL 50 MG PO TABS: 50.0000 mg | ORAL_TABLET | Freq: Every day | ORAL | 0 refills | Status: DC

## 2016-09-11 MED ORDER — BUSPIRONE HCL 15 MG PO TABS: ORAL_TABLET | ORAL | 0 refills | Status: DC

## 2016-09-11 NOTE — Telephone Encounter (Signed)
Per Dr. Casimiro Needle, I called in a one month supply of medication, we will check with her again in a month

## 2016-10-01 ENCOUNTER — Other Ambulatory Visit (HOSPITAL_COMMUNITY): Payer: Self-pay | Admitting: Psychiatry

## 2016-10-01 ENCOUNTER — Other Ambulatory Visit: Payer: Self-pay | Admitting: Pulmonary Disease

## 2016-10-03 ENCOUNTER — Ambulatory Visit: Payer: Self-pay

## 2016-10-04 ENCOUNTER — Encounter: Payer: Self-pay | Admitting: Pulmonary Disease

## 2016-10-04 ENCOUNTER — Inpatient Hospital Stay (HOSPITAL_COMMUNITY)
Admission: AD | Admit: 2016-10-04 | Discharge: 2016-10-05 | DRG: 433 | Disposition: A | Discharge status: Home Health | Payer: Medicaid Other | Source: Ambulatory Visit | Attending: Internal Medicine | Admitting: Internal Medicine

## 2016-10-04 ENCOUNTER — Inpatient Hospital Stay (HOSPITAL_COMMUNITY): Payer: Medicaid Other

## 2016-10-04 ENCOUNTER — Ambulatory Visit (INDEPENDENT_AMBULATORY_CARE_PROVIDER_SITE_OTHER): Payer: Medicaid Other | Admitting: Pulmonary Disease

## 2016-10-04 VITALS — BP 130/81 | HR 103 | Temp 97.8°F | Wt 225.1 lb

## 2016-10-04 DIAGNOSIS — I5030 Unspecified diastolic (congestive) heart failure: Secondary | ICD-10-CM | POA: Diagnosis present

## 2016-10-04 DIAGNOSIS — Y92009 Unspecified place in unspecified non-institutional (private) residence as the place of occurrence of the external cause: Secondary | ICD-10-CM

## 2016-10-04 DIAGNOSIS — R278 Other lack of coordination: Secondary | ICD-10-CM | POA: Diagnosis present

## 2016-10-04 DIAGNOSIS — F329 Major depressive disorder, single episode, unspecified: Secondary | ICD-10-CM | POA: Diagnosis present

## 2016-10-04 DIAGNOSIS — Z9181 History of falling: Secondary | ICD-10-CM

## 2016-10-04 DIAGNOSIS — G8929 Other chronic pain: Secondary | ICD-10-CM

## 2016-10-04 DIAGNOSIS — R140 Abdominal distension (gaseous): Secondary | ICD-10-CM

## 2016-10-04 DIAGNOSIS — Z87891 Personal history of nicotine dependence: Secondary | ICD-10-CM

## 2016-10-04 DIAGNOSIS — K7031 Alcoholic cirrhosis of liver with ascites: Secondary | ICD-10-CM

## 2016-10-04 DIAGNOSIS — R0602 Shortness of breath: Secondary | ICD-10-CM

## 2016-10-04 DIAGNOSIS — Z79899 Other long term (current) drug therapy: Secondary | ICD-10-CM

## 2016-10-04 DIAGNOSIS — K219 Gastro-esophageal reflux disease without esophagitis: Secondary | ICD-10-CM | POA: Diagnosis present

## 2016-10-04 DIAGNOSIS — Z886 Allergy status to analgesic agent status: Secondary | ICD-10-CM

## 2016-10-04 DIAGNOSIS — Z79891 Long term (current) use of opiate analgesic: Secondary | ICD-10-CM | POA: Diagnosis not present

## 2016-10-04 DIAGNOSIS — R188 Other ascites: Principal | ICD-10-CM

## 2016-10-04 DIAGNOSIS — B1920 Unspecified viral hepatitis C without hepatic coma: Secondary | ICD-10-CM | POA: Diagnosis present

## 2016-10-04 DIAGNOSIS — K766 Portal hypertension: Secondary | ICD-10-CM | POA: Diagnosis present

## 2016-10-04 DIAGNOSIS — W19XXXA Unspecified fall, initial encounter: Secondary | ICD-10-CM | POA: Insufficient documentation

## 2016-10-04 DIAGNOSIS — Z888 Allergy status to other drugs, medicaments and biological substances status: Secondary | ICD-10-CM

## 2016-10-04 DIAGNOSIS — J9601 Acute respiratory failure with hypoxia: Secondary | ICD-10-CM | POA: Diagnosis present

## 2016-10-04 DIAGNOSIS — F1021 Alcohol dependence, in remission: Secondary | ICD-10-CM

## 2016-10-04 DIAGNOSIS — W010XXA Fall on same level from slipping, tripping and stumbling without subsequent striking against object, initial encounter: Secondary | ICD-10-CM | POA: Diagnosis present

## 2016-10-04 DIAGNOSIS — F419 Anxiety disorder, unspecified: Secondary | ICD-10-CM | POA: Diagnosis present

## 2016-10-04 DIAGNOSIS — R296 Repeated falls: Secondary | ICD-10-CM | POA: Diagnosis present

## 2016-10-04 DIAGNOSIS — K7040 Alcoholic hepatic failure without coma: Principal | ICD-10-CM | POA: Diagnosis present

## 2016-10-04 DIAGNOSIS — I8500 Esophageal varices without bleeding: Secondary | ICD-10-CM | POA: Diagnosis present

## 2016-10-04 DIAGNOSIS — I110 Hypertensive heart disease with heart failure: Secondary | ICD-10-CM | POA: Diagnosis present

## 2016-10-04 DIAGNOSIS — R4189 Other symptoms and signs involving cognitive functions and awareness: Secondary | ICD-10-CM | POA: Diagnosis present

## 2016-10-04 DIAGNOSIS — K7030 Alcoholic cirrhosis of liver without ascites: Secondary | ICD-10-CM | POA: Diagnosis not present

## 2016-10-04 DIAGNOSIS — S41101A Unspecified open wound of right upper arm, initial encounter: Secondary | ICD-10-CM | POA: Diagnosis present

## 2016-10-04 DIAGNOSIS — K7290 Hepatic failure, unspecified without coma: Secondary | ICD-10-CM | POA: Diagnosis not present

## 2016-10-04 DIAGNOSIS — K7682 Hepatic encephalopathy: Secondary | ICD-10-CM

## 2016-10-04 DIAGNOSIS — J449 Chronic obstructive pulmonary disease, unspecified: Secondary | ICD-10-CM | POA: Diagnosis present

## 2016-10-04 DIAGNOSIS — Z9114 Patient's other noncompliance with medication regimen: Secondary | ICD-10-CM | POA: Diagnosis not present

## 2016-10-04 DIAGNOSIS — G894 Chronic pain syndrome: Secondary | ICD-10-CM

## 2016-10-04 DIAGNOSIS — S71102A Unspecified open wound, left thigh, initial encounter: Secondary | ICD-10-CM | POA: Diagnosis not present

## 2016-10-04 DIAGNOSIS — S81802A Unspecified open wound, left lower leg, initial encounter: Secondary | ICD-10-CM

## 2016-10-04 DIAGNOSIS — K7460 Unspecified cirrhosis of liver: Secondary | ICD-10-CM

## 2016-10-04 LAB — CBC WITH DIFFERENTIAL/PLATELET
BASOS ABS: 0 10*3/uL (ref 0.0–0.1)
Basophils Relative: 1 %
Eosinophils Absolute: 0.3 10*3/uL (ref 0.0–0.7)
Eosinophils Relative: 3 %
HEMATOCRIT: 39.5 % (ref 36.0–46.0)
HEMOGLOBIN: 13.4 g/dL (ref 12.0–15.0)
LYMPHS PCT: 28 %
Lymphs Abs: 2.1 10*3/uL (ref 0.7–4.0)
MCH: 32.7 pg (ref 26.0–34.0)
MCHC: 33.9 g/dL (ref 30.0–36.0)
MCV: 96.3 fL (ref 78.0–100.0)
Monocytes Absolute: 1.1 10*3/uL — ABNORMAL HIGH (ref 0.1–1.0)
Monocytes Relative: 15 %
NEUTROS PCT: 53 %
Neutro Abs: 3.8 10*3/uL (ref 1.7–7.7)
Platelets: 120 10*3/uL — ABNORMAL LOW (ref 150–400)
RBC: 4.1 MIL/uL (ref 3.87–5.11)
RDW: 16.5 % — ABNORMAL HIGH (ref 11.5–15.5)
WBC: 7.3 10*3/uL (ref 4.0–10.5)

## 2016-10-04 LAB — COMPREHENSIVE METABOLIC PANEL
ALT: 21 U/L (ref 14–54)
AST: 52 U/L — AB (ref 15–41)
Albumin: 2.2 g/dL — ABNORMAL LOW (ref 3.5–5.0)
Alkaline Phosphatase: 158 U/L — ABNORMAL HIGH (ref 38–126)
Anion gap: 8 (ref 5–15)
BILIRUBIN TOTAL: 3.6 mg/dL — AB (ref 0.3–1.2)
BUN: 5 mg/dL — AB (ref 6–20)
CO2: 22 mmol/L (ref 22–32)
CREATININE: 0.7 mg/dL (ref 0.44–1.00)
Calcium: 8.1 mg/dL — ABNORMAL LOW (ref 8.9–10.3)
Chloride: 106 mmol/L (ref 101–111)
GFR calc Af Amer: >60 mL/min (ref >60)
GFR calc non Af Amer: >60 mL/min (ref >60)
GLUCOSE: 92 mg/dL (ref 65–99)
Potassium: 3.5 mmol/L (ref 3.5–5.1)
Sodium: 136 mmol/L (ref 135–145)
TOTAL PROTEIN: 6.4 g/dL — AB (ref 6.5–8.1)

## 2016-10-04 LAB — LACTATE DEHYDROGENASE, PLEURAL OR PERITONEAL FLUID: LD FL: 31 U/L — AB (ref 3–23)

## 2016-10-04 LAB — PROTEIN, BODY FLUID: Total protein, fluid: <3 g/dL

## 2016-10-04 LAB — GLUCOSE, SEROUS FLUID: GLUCOSE FL: 109 mg/dL

## 2016-10-04 LAB — PROTIME-INR
INR: 2.14
Prothrombin Time: 24.3 seconds — ABNORMAL HIGH (ref 11.4–15.2)

## 2016-10-04 LAB — ALBUMIN, FLUID (OTHER): Albumin, Fluid: <1 g/dL

## 2016-10-04 MED ORDER — ONDANSETRON HCL 4 MG PO TABS: 4.0000 mg | ORAL_TABLET | Freq: Four times a day (QID) | ORAL | Status: DC | PRN

## 2016-10-04 MED ORDER — SPIRONOLACTONE 25 MG PO TABS
100.0000 mg | ORAL_TABLET | Freq: Every day | ORAL | Status: DC
  Administered 2016-10-04 – 2016-10-05 (×2): 100 mg via ORAL
  Filled 2016-10-04 (×2): qty 4

## 2016-10-04 MED ORDER — HYDROXYZINE HCL 25 MG PO TABS
25.0000 mg | ORAL_TABLET | Freq: Three times a day (TID) | ORAL | Status: DC
  Administered 2016-10-04 – 2016-10-05 (×3): 25 mg via ORAL
  Filled 2016-10-04 (×3): qty 1

## 2016-10-04 MED ORDER — TRAMADOL HCL 50 MG PO TABS: 50.0000 mg | ORAL_TABLET | Freq: Four times a day (QID) | ORAL | 0 refills | Status: DC | PRN

## 2016-10-04 MED ORDER — FLUTICASONE PROPIONATE 50 MCG/ACT NA SUSP: 1.0000 | Freq: Every day | NASAL | Status: DC | PRN

## 2016-10-04 MED ORDER — TRAZODONE HCL 50 MG PO TABS
50.0000 mg | ORAL_TABLET | Freq: Every day | ORAL | Status: DC
  Administered 2016-10-04: 50 mg via ORAL
  Filled 2016-10-04: qty 1

## 2016-10-04 MED ORDER — RIFAXIMIN 550 MG PO TABS
550.0000 mg | ORAL_TABLET | Freq: Two times a day (BID) | ORAL | Status: DC
  Administered 2016-10-04 – 2016-10-05 (×2): 550 mg via ORAL
  Filled 2016-10-04 (×2): qty 1

## 2016-10-04 MED ORDER — TIOTROPIUM BROMIDE MONOHYDRATE 18 MCG IN CAPS: 18.0000 ug | ORAL_CAPSULE | Freq: Every day | RESPIRATORY_TRACT | 3 refills | Status: DC

## 2016-10-04 MED ORDER — POTASSIUM CHLORIDE CRYS ER 20 MEQ PO TBCR
20.0000 meq | EXTENDED_RELEASE_TABLET | Freq: Every day | ORAL | Status: DC
  Administered 2016-10-04 – 2016-10-05 (×2): 20 meq via ORAL
  Filled 2016-10-04 (×2): qty 1

## 2016-10-04 MED ORDER — ALBUTEROL SULFATE (2.5 MG/3ML) 0.083% IN NEBU: 2.5000 mg | INHALATION_SOLUTION | Freq: Four times a day (QID) | RESPIRATORY_TRACT | 3 refills | Status: DC | PRN

## 2016-10-04 MED ORDER — FUROSEMIDE 20 MG PO TABS
20.0000 mg | ORAL_TABLET | Freq: Two times a day (BID) | ORAL | Status: DC
  Administered 2016-10-04 – 2016-10-05 (×2): 20 mg via ORAL
  Filled 2016-10-04 (×3): qty 1

## 2016-10-04 MED ORDER — MOMETASONE FURO-FORMOTEROL FUM 200-5 MCG/ACT IN AERO: 2.0000 | INHALATION_SPRAY | Freq: Two times a day (BID) | RESPIRATORY_TRACT | 2 refills | Status: AC

## 2016-10-04 MED ORDER — RIFAXIMIN 550 MG PO TABS: ORAL_TABLET | ORAL | 6 refills | Status: DC

## 2016-10-04 MED ORDER — PROPRANOLOL HCL 20 MG PO TABS
20.0000 mg | ORAL_TABLET | Freq: Every day | ORAL | Status: DC
  Administered 2016-10-04: 20 mg via ORAL
  Filled 2016-10-04 (×2): qty 1

## 2016-10-04 MED ORDER — DICLOFENAC SODIUM 1 % TD GEL: 4.0000 g | Freq: Four times a day (QID) | TRANSDERMAL | Status: DC | PRN

## 2016-10-04 MED ORDER — ONDANSETRON HCL 4 MG/2ML IJ SOLN: 4.0000 mg | Freq: Four times a day (QID) | INTRAMUSCULAR | Status: DC | PRN

## 2016-10-04 MED ORDER — PANTOPRAZOLE SODIUM 40 MG PO TBEC
40.0000 mg | DELAYED_RELEASE_TABLET | Freq: Every day | ORAL | Status: DC
  Administered 2016-10-04: 40 mg via ORAL
  Filled 2016-10-04: qty 1

## 2016-10-04 MED ORDER — TRAMADOL HCL 50 MG PO TABS
50.0000 mg | ORAL_TABLET | Freq: Four times a day (QID) | ORAL | Status: DC | PRN
  Administered 2016-10-05: 50 mg via ORAL
  Filled 2016-10-04: qty 1

## 2016-10-04 MED ORDER — TIOTROPIUM BROMIDE MONOHYDRATE 18 MCG IN CAPS
18.0000 ug | ORAL_CAPSULE | Freq: Every day | RESPIRATORY_TRACT | Status: DC
  Administered 2016-10-05: 18 ug via RESPIRATORY_TRACT
  Filled 2016-10-04: qty 5

## 2016-10-04 MED ORDER — BUSPIRONE HCL 15 MG PO TABS
15.0000 mg | ORAL_TABLET | Freq: Two times a day (BID) | ORAL | Status: DC
  Administered 2016-10-04 – 2016-10-05 (×2): 15 mg via ORAL
  Filled 2016-10-04 (×2): qty 1

## 2016-10-04 MED ORDER — LORAZEPAM 1 MG PO TABS
1.0000 mg | ORAL_TABLET | Freq: Two times a day (BID) | ORAL | Status: DC
  Administered 2016-10-04 – 2016-10-05 (×2): 1 mg via ORAL
  Filled 2016-10-04 (×2): qty 1

## 2016-10-04 MED ORDER — PANTOPRAZOLE SODIUM 40 MG PO TBEC: 40.0000 mg | DELAYED_RELEASE_TABLET | Freq: Every day | ORAL | 1 refills | Status: DC

## 2016-10-04 MED ORDER — LACTULOSE 10 GM/15ML PO SOLN
20.0000 g | Freq: Three times a day (TID) | ORAL | Status: DC
  Administered 2016-10-04 – 2016-10-05 (×3): 20 g via ORAL
  Filled 2016-10-04 (×3): qty 30

## 2016-10-04 MED ORDER — MOMETASONE FURO-FORMOTEROL FUM 200-5 MCG/ACT IN AERO
2.0000 | INHALATION_SPRAY | Freq: Two times a day (BID) | RESPIRATORY_TRACT | Status: DC
  Administered 2016-10-04 – 2016-10-05 (×2): 2 via RESPIRATORY_TRACT
  Filled 2016-10-04: qty 8.8

## 2016-10-04 MED ORDER — DEXTROSE 5 % IV SOLN
1.0000 g | INTRAVENOUS | Status: DC
  Administered 2016-10-04: 1 g via INTRAVENOUS
  Filled 2016-10-04 (×2): qty 10

## 2016-10-04 MED ORDER — SPIRONOLACTONE 50 MG PO TABS: 50.0000 mg | ORAL_TABLET | Freq: Every day | ORAL | 1 refills | Status: DC

## 2016-10-04 MED ORDER — PROPRANOLOL HCL 20 MG PO TABS: 20.0000 mg | ORAL_TABLET | Freq: Every day | ORAL | 3 refills | Status: DC

## 2016-10-04 NOTE — Assessment & Plan Note (Addendum)
Nonhealing left anterior thigh wound. No purulent drainage. Some erythema but no increased warmth. Dry dressings for now. Consider wound care consult while she is hospitalized.

## 2016-10-04 NOTE — Progress Notes (Signed)
Report called to [facility] [id number]

## 2016-10-04 NOTE — Assessment & Plan Note (Signed)
She would like refill of her tramadol. Will prescribe 4 tablets for now. She may have the rest of her prescription (usual is 120 tablets per month) when she is discharged from the hospital.

## 2016-10-04 NOTE — Telephone Encounter (Signed)
Called tramadol #4, 0 refills to [facility]

## 2016-10-04 NOTE — H&P (Signed)
Date: 10/04/2016               Patient Name:  [name] [name] MRN: [id number]  DOB: 07-02-1961 Age / Sex: 55 y.o., female   PCP: Milagros Loll, MD         Medical Service: Internal Medicine Teaching Service         Attending Physician: Dr. Aldine Contes, MD    First Contact: Dr. Velna Ochs Pager: [phone]  Second Contact: Dr. Maryellen Pile  Pager: [phone]       After Hours (After 5p/  First Contact Pager: [phone]  weekends / holidays): Second Contact Pager: [phone]   Chief Complaint: Falls  History of Present Illness: Patient is a 55 yo F pmhx of HTN, hep C, alcoholic cirrhosis, COPD, GERD, depression/anxiety presenting from clinic with recurrent falls and worsening abdominal ascites concerning for hepatic encephalopathy and SBP. Patient was recently admitted 11/1 for hepatic encephalopathy and discharged on 20 mg TID of lactulose. She reported in clinic that she was compliant with her lactulose and had been taking it daily, however, on admission her husband reported that her lactulose never got filled by the pharmacy and was not in her medication bag at home. Per husband, patient has been having 1-2 BM a day. Patient was a very poor historian, but reported multiple recurrent falls since her discharge one month ago. Most recently, she fell yesterday in her house. She said she couldn't pick up her feet up and tripped over the rug. Prior to the fall she said she felt like her head was spinning, but she denied LOC and remembers the event. She did report hitting her head when she fell but denies other trauma. She denies SOB, and chest pain. She denies any recent illnesses. She denies fevers but endorses chills at home. Patient says for the past week she has felt very weak and has been mostly bed bound. She says she has gained a lot of weight over the past month. She reportedly missed one of her doctors appointment because she was too embarrassed and didn't have any clothes that fit  her. Her mother went out and bought her clothes so that she could come to clinic today. She is currently sober, last drink was many years ago.   Meds:  Current Meds  Medication Sig  . albuterol (PROVENTIL) (2.5 MG/3ML) 0.083% nebulizer solution Take 3 mLs (2.5 mg total) by nebulization every 6 (six) hours as needed for wheezing or shortness of breath.     Allergies: Allergies as of 10/04/2016 - Review Complete 10/04/2016  Allergen Reaction Noted  . Ibuprofen Other (See Comments) 02/26/2016  . Tylenol [acetaminophen] Other (See Comments) 03/29/2015  . Amitriptyline Other (See Comments) 01/22/2013   Past Medical History:  Diagnosis Date  . Adverse drug effect - [place] 03/07/2015  . Alcoholic cirrhosis ([place])   . Allergy   . Anxiety   . Arthritis   . cervical Cancer 1999  . Cholelithiasis   . COPD (chronic obstructive pulmonary disease) ([place])   . Decompensated hepatic cirrhosis ([place]) 03/07/2015   Side effect of [place]era  . Depression   . Esophageal varices ([place])   . Esophagitis 2010  . Fatty liver   . Gastritis   . GERD (gastroesophageal reflux disease)   . Heart palpitations   . Hepatitis C    chronic hepatitis C and Steatohepatitis (hep grade 2, stage 2-3) per 05/13/08 liver biopsy  . Hypertension   . Obesity   .  Portal hypertension ([place])   . Shortness of breath   . Splenomegaly   . Tubulovillous adenoma of colon   . Visual hallucination    since 09/2010/notes 10/30/2012    Family History:   Social History: Lives at home with her husband. Former smoker. 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 Denies illicit drug use.   Review of Systems: A complete ROS was negative except as per HPI.   Physical Exam: Blood pressure 124/68, pulse 96, temperature 97.8 F (36.6 C), temperature source Oral, resp. rate 20, height 5\' 4"  (1.626 m), weight 222 lb 1.6 oz (100.7 kg), SpO2 94 %. Constitutional: NAD, appears comfortable HEENT: Atraumatic, normocephalic. PERRL, anicteric sclera.    Cardiovascular: RRR, no murmurs, rubs, or gallops.  Pulmonary/Chest: CTAB, no wheezes, rales, or rhonchi.  Abdominal: Distended (evaluated s/p paracentesis), soft and non tender. Normal bowel sounds. Extremities: + mild asterixis. Bilateral venous stasis dermatitis with open ulcerations on the Left lower extremity. Right upper extremity with a 3cm open wound, surrounding erythema, no drainage, central cavity with good granulation tissue. Right big toe with necrotic ulceration, no drainage.  Neurological: A&Ox3, CN II - XII grossly intact.  Skin: Large ecchymosis on central back Psychiatric: Normal mood and affect      Assessment & Plan by Problem:  Ascites: With grade 1 hepatic encephalopathy and mild asterixis on exam. Presented from clinic with worsening abdominal distention and weight gain for the past month in the setting of lactulose non compliance. Multiple recurrent falls over this time period. She is afebrile here with stable vitals, no leukocytosis on admission labs. But given her history of fatigue, chills, and being mostly bed bound at home, there is concern for SBP. S/p diagnostic paracentesis on admission with 1.7L removed. Will cover empirically with abx.  -- S/p paracentesis with 1.7L removed, f/u lab results  -- Restarted home lactulose 20 g TID -- Rifaximin 550 mg BID -- Ceftriaxone 1 g q24 hours  -- Continue home propanolol 20 mg daily for esophageal varices  -- Continue home spironolactone 100 mg daily  Right Upper Extremity Wound: Secondary to fall. No signs of infection at this time. -- Wound care consult   Recurrent Falls: Likely due to hepatic encephalopathy. Management as above. -- PT / OT consult  -- Continue home Voltaren gel  HFpEF: -- Continue home Lasix 20 mg BID  COPD: -- Continue home Spiriva daily  -- Continue home Dulera  BID  Anxiety: -- Continue home ativan 1 mg BID -- Continue home atarax 25 mg TID -- Continue home trazadone 50 mg QHS --  Continue Buspar 15 mg BID  GERD:  -- Continue home protonix 40 mg daily   Chronic Pain:  -- Continue home tramadol 50 mg q6 prn  FEN: no fluids, replete lytes prn, regular diet VTE ppx: SCDs Code Status: FULL   Dispo: Admit patient to Inpatient with expected length of stay greater than 2 midnights.  Signed: Velna Ochs, MD 10/04/2016, 7:04 PM  Pager: [phone]

## 2016-10-04 NOTE — Procedures (Signed)
Paracentesis Procedure Note  Indications: Ascites  Procedure Details  Informed consent was obtained after explanation of the risks and benefits of the procedure, refer to the consent documentation.  Time-out was performed immediately prior to the procedure.  The head of the bed was placed 30-45 degrees above level and the meniscus of the ascites was evaluated by bedside ultrasound and a large area of ascitic fluid was identified in the LLQ.  Local anesthesia with 1 percent lidocaine was introduced subcutaneously then deep to the skin until the parietal peritoneum was anesthetized. A parcentesis needle was introduced into this site until ascitic fluid was encountered.  Ascitic fluid and the needle were removed with minimal bleeding.  A sterile bandage was placed after holding pressure.   Findings: 1700 mL of yellow ascites fluid was obtained.  The ascites fluid was sent for gram stain, culture and further analysis.        Condition:   The patient tolerated the procedure well and remains in the same condition as pre-procedure.  Complications: None; patient tolerated the procedure well.

## 2016-10-04 NOTE — Assessment & Plan Note (Signed)
Several falls at home. She reports that her legs feel weaker from having the increased edema. Would have PT evaluate her after diuresis.

## 2016-10-04 NOTE — Patient Instructions (Addendum)
Please come in tonight for admission to the hospital. If you worsen or develop any new symptoms before your bed is available, please go to the emergency room.

## 2016-10-04 NOTE — Assessment & Plan Note (Addendum)
Assessment: Distended abdomen. Likely reaccumulated ascites.  Plan:  She declined in office paracentesis. I strongly advised her to be admitted tonight. Will put in admission bed request. She will need dx and tx paracentesis with probable albumin replacement. At this time, she would like to go home and come back for admission. I warned her about the risks of doing so and of not coming in for hospitalization. She understands the risks and benefits. She opted to go home first then come back in to the hospital. Obtain CMP, CBC, PT/INR

## 2016-10-04 NOTE — Progress Notes (Signed)
Pt is a direct admit. Assisted pt from wheelchair to bed with walker. Pt is very weak. Pt stated, "I fall every day at home". Will initiate fall precautions.

## 2016-10-04 NOTE — Progress Notes (Signed)
   CC: fall, bilateral leg pain  HPI:  [name] [name] is a 55 y.o. with hypertension, hepatitis C, alcohol cirrhosis, COPD, GERD, depression/anxiety presenting for follow up of hepatic encephalopathy and fall.  Recently hospitalized 11/1 for hepatic encephalopathy.   Hepatic encephalopathy: Discharged on 20mg  TID of lactulose. She reports she has been taking this. She has only been having 1 BM per day. She takes the lactulose 2-3 times a day. She denies confusion or sleepiness.   Cirrhosis: She reports increased abdominal distention.  She fell yesterday. She was walking. She felt her legs collapsed - left leg. She thinks she hit her head. She denies LOC. Unwitnessed. She has BLE edema. No chest pain or dyspnea. Denies orthopnea or PND. She uses her albuterol 2-3 times per day.   She has a right thigh wound from a fall 3 weeks ago. Thin white yellowish drainage. Some erythema.   Past Medical History:  Diagnosis Date  . Adverse drug effect - [facility] 03/07/2015  . Alcoholic cirrhosis ([place])   . Allergy   . Anxiety   . Arthritis   . cervical Cancer 1999  . Cholelithiasis   . COPD (chronic obstructive pulmonary disease) ([place])   . Decompensated hepatic cirrhosis ([place]) 03/07/2015   Side effect of [place]  . Depression   . Esophageal varices ([place])   . Esophagitis 2010  . Fatty liver   . Gastritis   . GERD (gastroesophageal reflux disease)   . Heart palpitations   . Hepatitis C    chronic hepatitis C and Steatohepatitis (hep grade 2, stage 2-3) per 05/13/08 liver biopsy  . Hypertension   . Obesity   . Portal hypertension ([place])   . Shortness of breath   . Splenomegaly   . Tubulovillous adenoma of colon   . Visual hallucination    since 09/2010/notes 10/30/2012    Review of Systems:   +Subjective fevers and chills She has had some blood in her stools. A lot 4-5 days ago.  No dysuria or hematuria  Physical Exam:  Vitals:   10/04/16 1356  BP: 130/81  Pulse: (!) 103  Temp:  97.8 F (36.6 C)  SpO2: (!) 87%  Weight: 225 lb 1.6 oz (102.1 kg)   General Apperance: NAD HEENT: Normocephalic, atraumatic, anicteric sclera Neck: Supple, trachea midline Lungs: Clear to auscultation bilaterally. No wheezes, rhonchi or rales. Audible upper airway wheezes. Heart: Regular rate and rhythm, no murmur/rub/gallop Abdomen: Soft, nontender, +distended with fluid wave, no rebound/guarding Extremities: Warm and well perfused, 2+ edema to upper thighs Skin: 4cm open curved wound with serous drainage and some surrounding erythema Neurologic: Mildly lethargic but interactive. Oriented x 3. No gross deficits.  Assessment & Plan:   See Encounters Tab for problem based charting.  Patient discussed with Dr. Evette Doffing

## 2016-10-04 NOTE — Assessment & Plan Note (Addendum)
Her O2 sat was 87-89% at rest. She is not in COPD exacerbation at this time. This is related to volume overload from her cirrhosis.   Check CMP Would check CXR Diuresis Hospitalization and will need ambulatory sats and consider home O2.

## 2016-10-04 NOTE — Progress Notes (Signed)
Pharmacy Antibiotic Note  [name] [name] is a 55 y.o. female with hepatitis C, alcohol cirrhosis, admitted on 10/04/2016 s/p a fall, and b/l leg pain. Recent admission for hepatic encephalopathy in November. Pharmacy is consulted to start rocephin for SBP prophylaxis    Plan: Rocephin 1g IV Q 24 hrs x 7 days Pharmacy sign off.  Height: 5\' 4"  (162.6 cm) Weight: 222 lb 1.6 oz (100.7 kg) IBW/kg (Calculated) : 54.7  Temp (24hrs), Avg:97.8 F (36.6 C), Min:97.8 F (36.6 C), Max:97.8 F (36.6 C)   Recent Labs Lab 10/04/16 1530  WBC 7.3  CREATININE 0.70    Estimated Creatinine Clearance: 89.5 mL/min (by C-G formula based on SCr of 0.7 mg/dL).    Allergies  Allergen Reactions  . Ibuprofen Other (See Comments)    ulcers  . Tylenol [Acetaminophen] Other (See Comments)    2/2 liver damage   . Amitriptyline Other (See Comments)    hallucinations     Antimicrobials this admission: Rocephin 12/8 >>   Microbiology results:   Thank you for allowing pharmacy to be a part of this patient's care.  Maryanna Shape, PharmD, BCPS  Clinical Pharmacist  Pager: [phone]   10/04/2016 6:50 PM

## 2016-10-04 NOTE — Assessment & Plan Note (Signed)
Assessment: She is not having goal number of bowel movements. Her HE is grade I to II.   Plan: Continue rifaximin 550mg  BID and increase lactulose to QID to goal BM 2-3 a day.

## 2016-10-05 ENCOUNTER — Encounter (HOSPITAL_COMMUNITY): Payer: Self-pay

## 2016-10-05 DIAGNOSIS — K7290 Hepatic failure, unspecified without coma: Secondary | ICD-10-CM

## 2016-10-05 DIAGNOSIS — R0602 Shortness of breath: Secondary | ICD-10-CM

## 2016-10-05 LAB — COMPREHENSIVE METABOLIC PANEL
ALT: 15 U/L (ref 14–54)
ANION GAP: 5 (ref 5–15)
AST: 36 U/L (ref 15–41)
Albumin: 1.5 g/dL — ABNORMAL LOW (ref 3.5–5.0)
Alkaline Phosphatase: 112 U/L (ref 38–126)
BUN: 6 mg/dL (ref 6–20)
CHLORIDE: 107 mmol/L (ref 101–111)
CO2: 26 mmol/L (ref 22–32)
Calcium: 7.6 mg/dL — ABNORMAL LOW (ref 8.9–10.3)
Creatinine, Ser: 0.71 mg/dL (ref 0.44–1.00)
GFR calc non Af Amer: >60 mL/min (ref >60)
Glucose, Bld: 131 mg/dL — ABNORMAL HIGH (ref 65–99)
POTASSIUM: 3.8 mmol/L (ref 3.5–5.1)
SODIUM: 138 mmol/L (ref 135–145)
Total Bilirubin: 2.5 mg/dL — ABNORMAL HIGH (ref 0.3–1.2)
Total Protein: 4.4 g/dL — ABNORMAL LOW (ref 6.5–8.1)

## 2016-10-05 LAB — CBC
HEMATOCRIT: 31.6 % — AB (ref 36.0–46.0)
HEMOGLOBIN: 10.4 g/dL — AB (ref 12.0–15.0)
MCH: 31.6 pg (ref 26.0–34.0)
MCHC: 32.9 g/dL (ref 30.0–36.0)
MCV: 96 fL (ref 78.0–100.0)
Platelets: 97 10*3/uL — ABNORMAL LOW (ref 150–400)
RBC: 3.29 MIL/uL — AB (ref 3.87–5.11)
RDW: 16.6 % — ABNORMAL HIGH (ref 11.5–15.5)
WBC: 4.6 10*3/uL (ref 4.0–10.5)

## 2016-10-05 LAB — PROTIME-INR
INR: 2.4
Prothrombin Time: 26.6 seconds — ABNORMAL HIGH (ref 11.4–15.2)

## 2016-10-05 LAB — TSH: TSH: 5.18 u[IU]/mL — AB (ref 0.350–4.500)

## 2016-10-05 MED ORDER — SPIRONOLACTONE 100 MG PO TABS: 100.0000 mg | ORAL_TABLET | Freq: Every day | ORAL | 2 refills | Status: DC

## 2016-10-05 MED ORDER — LACTULOSE 10 GM/15ML PO SOLN: 20.0000 g | Freq: Three times a day (TID) | ORAL | 2 refills | Status: DC

## 2016-10-05 MED ORDER — RIFAXIMIN 550 MG PO TABS: 550.0000 mg | ORAL_TABLET | Freq: Two times a day (BID) | ORAL | 2 refills | Status: AC

## 2016-10-05 MED ORDER — TRAMADOL HCL 50 MG PO TABS: 50.0000 mg | ORAL_TABLET | Freq: Four times a day (QID) | ORAL | 0 refills | Status: DC | PRN

## 2016-10-05 NOTE — Progress Notes (Signed)
[name] [name] to be D/C'd Home per MD order.  Discussed with the patient and all questions fully answered.  VSS, Skin clean, dry and intact without evidence of skin break down, no evidence of skin tears noted. IV catheter discontinued intact. Site without signs and symptoms of complications. Dressing and pressure applied.  An After Visit Summary was printed and given to the patient. Patient received prescription and informed about scripts sent over to [facility].   D/c education completed with patient/family including follow up instructions, medication list, d/c activities limitations if indicated, with other d/c instructions as indicated by MD - patient able to verbalize understanding, all questions fully answered.   Patient instructed to return to ED, call 911, or call MD for any changes in condition.   Patient escorted via [place], and D/C home via private auto.  [name] [name] 10/05/2016 5:25 PM    Medication List    TAKE these medications   albuterol 108 (90 Base) MCG/ACT inhaler Commonly known as:  PROVENTIL HFA;VENTOLIN HFA Inhale 2 puffs into the lungs every 6 (six) hours as needed for wheezing or shortness of breath.   albuterol (2.5 MG/3ML) 0.083% nebulizer solution Commonly known as:  PROVENTIL Take 3 mLs (2.5 mg total) by nebulization every 6 (six) hours as needed for wheezing or shortness of breath.   budesonide-formoterol 80-4.5 MCG/ACT inhaler Commonly known as:  SYMBICORT Inhale 2 puffs into the lungs 2 (two) times daily.   busPIRone 15 MG tablet Commonly known as:  BUSPAR 15 mg in the morning then 30 mg in the evening What changed:  how much to take  how to take this  when to take this  additional instructions   doxepin 25 MG capsule Commonly known as:  SINEQUAN Take 25 mg by mouth at bedtime.   hydrOXYzine 25 MG tablet Commonly known as:  ATARAX/VISTARIL Take 1 tablet (25 mg total) by mouth 3 (three) times daily.   lactulose 10 GM/15ML  solution Commonly known as:  CHRONULAC Take 30 mLs (20 g total) by mouth 3 (three) times daily.   LORazepam 1 MG tablet Commonly known as:  ATIVAN Take 1 tablet (1 mg total) by mouth 2 (two) times daily.   Melatonin 1 MG Caps Take 1 mg by mouth at bedtime.   meloxicam 7.5 MG tablet Commonly known as:  MOBIC Take 7.5 mg by mouth daily.   mometasone-formoterol 200-5 MCG/ACT Aero Commonly known as:  DULERA Inhale 2 puffs into the lungs 2 (two) times daily.   pantoprazole 40 MG tablet Commonly known as:  PROTONIX Take 1 tablet (40 mg total) by mouth daily.   potassium chloride SA 20 MEQ tablet Commonly known as:  K-DUR,KLOR-CON Take 1 tablet (20 mEq total) by mouth daily.   propranolol 20 MG tablet Commonly known as:  INDERAL Take 1 tablet (20 mg total) by mouth daily.   rifaximin 550 MG Tabs tablet Commonly known as:  XIFAXAN TAKE 1 TABLET (550 MG TOTAL) BY MOUTH TWO   (TWO) TIMES DAILY. What changed:  Another medication with the same name was added. Make sure you understand how and when to take each.   rifaximin 550 MG Tabs tablet Commonly known as:  XIFAXAN Take 1 tablet (550 mg total) by mouth 2 (two) times daily. What changed:  You were already taking a medication with the same name, and this prescription was added. Make sure you understand how and when to take each.   spironolactone 100 MG tablet Commonly known as:  ALDACTONE Take 1 tablet (100 mg total) by mouth daily. Start taking on:  10/06/2016 What changed:  medication strength  how much to take   tiotropium 18 MCG inhalation capsule Commonly known as:  SPIRIVA HANDIHALER Place 1 capsule (18 mcg total) into inhaler and inhale daily.   traMADol 50 MG tablet Commonly known as:  ULTRAM Take 1 tablet (50 mg total) by mouth every 6 (six) hours as needed for severe pain.   traZODone 50 MG tablet Commonly known as:  DESYREL Take 1 tablet (50 mg total) by mouth at bedtime.   VOLTAREN 1 % Gel Generic  drug:  diclofenac sodium APPLY 4 G TOPICALLY 4 (FOUR) TIMES DAILY.            Durable Medical Equipment        Start     Ordered   10/05/16 1453  For home use only DME standard manual wheelchair with seat cushion  Once    Comments:  Patient suffers from hepatic encephalopathy which impairs their ability to perform daily activities like bathing, dressing, feeding, grooming and toileting in the home.  A walker will not resolve  issue with performing activities of daily living. A wheelchair will allow patient to safely perform daily activities. Patient can safely propel the wheelchair in the home or has a caregiver who can provide assistance.  Accessories: elevating leg rests (ELRs), wheel locks, extensions and anti-tippers.   10/05/16 1453

## 2016-10-05 NOTE — Evaluation (Signed)
Physical Therapy Evaluation Patient Details Name: [name] [name] MRN: [id number] DOB: 06-07-1961 Today's Date: 10/05/2016   History of Present Illness  Pt is a 55 y/o female admitted secondary to recurrent falls and worsening abdominal ascites. PMH including but not limited to HTN, Hep C, cirrohsis and COPD.  Clinical Impression  Pt presented supine in bed with HOB elevated, awake and willing to participate in therapy session. Prior to admission, pt required assistance from husband for dressing, transfers and to ambulate very short distances within the home. Pt was dependent for all ADLs. Pt currently requires mod-max A for bed mobility and mod-max A to maintain upright sitting position. Pt refusing to perform transfers or stand during evaluation despite max encouragement from therapist and husband. Pt adamantly refusing SNF or rehab of any sort, but agreeable to HHPT services. Pt would continue to benefit from skilled physical therapy services at this time while admitted and after d/c to address her below listed limitations in order to improve her overall safety and independence with functional mobility.      Follow Up Recommendations SNF (pt currently refusing SNF; pt needs HH PT, HH OT, HH aide)    Equipment Recommendations  Wheelchair (measurements PT);Wheelchair cushion (measurements PT)    Recommendations for Other Services       Precautions / Restrictions Precautions Precautions: Fall Restrictions Weight Bearing Restrictions: No      Mobility  Bed Mobility Overal bed mobility: Needs Assistance Bed Mobility: Supine to Sit;Sit to Supine     Supine to sit: Mod assist;Max assist;HOB elevated Sit to supine: Mod assist   General bed mobility comments: pt required increased time, mod A with bilateral LEs and max A at trunk to achieve sitting EOB. Mod A with upper body and bilateral LEs to return to supine  Transfers                 General transfer comment: pt refusing  to perform any transfer or stand at this time, despite max encouragement from therapist and husband  Ambulation/Gait                Stairs            Wheelchair Mobility    Modified Rankin (Stroke Patients Only)       Balance Overall balance assessment: Needs assistance;History of Falls Sitting-balance support: Feet supported;Bilateral upper extremity supported Sitting balance-Leahy Scale: Zero Sitting balance - Comments: pt required mod-max A to maintain upright sitting balance at this time. pt only tolerating sitting upright for approximately 5 minutes Postural control: Posterior lean                                   Pertinent Vitals/Pain Pain Assessment: Faces Faces Pain Scale: Hurts even more Pain Location: abdomen Pain Descriptors / Indicators: Sore;Grimacing;Guarding;Moaning Pain Intervention(s): Monitored during session;Repositioned    Home Living Family/patient expects to be discharged to:: Private residence Living Arrangements: Spouse/significant other Available Help at Discharge: Family;Available PRN/intermittently   Home Access: Level entry     Home Layout: One level Home Equipment: Walker - 4 wheels;Cane - single point;Shower seat Additional Comments: Per husband, he performs all IADLs. He states that pt stays in bed most of the day and that he assists her with short walks in the house. He reported that she is unable to use the  [facility] or RW secondary to being too weak.    Prior Function Level of Independence:  Needs assistance   Gait / Transfers Assistance Needed: pt's husband stated that he ambulates short distances with pt in the home, with her holding onto him with both UEs.  ADL's / Homemaking Assistance Needed: Pt is dependent on husband for IADLs. Pt's husband states that she bathes herself and he assists her with dressing.        Hand Dominance        Extremity/Trunk Assessment   Upper Extremity Assessment: Generalized  weakness           Lower Extremity Assessment: Generalized weakness;RLE deficits/detail;LLE deficits/detail RLE Deficits / Details: pt unable to lift LE against gravity in bed or in supported sitting. Sensation grossly intact. LLE Deficits / Details: pt unable to lift LE against gravity in supine or in supported sitting. Sensation grossly intact.     Communication   Communication: No difficulties  Cognition Arousal/Alertness: Awake/alert Behavior During Therapy: WFL for tasks assessed/performed Overall Cognitive Status: Within Functional Limits for tasks assessed                      General Comments      Exercises     Assessment/Plan    PT Assessment Patient needs continued PT services  PT Problem List Decreased strength;Decreased activity tolerance;Decreased balance;Decreased mobility;Decreased coordination;Decreased knowledge of use of DME;Decreased safety awareness;Pain          PT Treatment Interventions DME instruction;Gait training;Functional mobility training;Therapeutic activities;Therapeutic exercise;Balance training;Neuromuscular re-education;Patient/family education    PT Goals (Current goals can be found in the Care Plan section)  Acute Rehab PT Goals Patient Stated Goal: return home PT Goal Formulation: With patient/family Time For Goal Achievement: 10/19/16 Potential to Achieve Goals: Fair    Frequency Min 3X/week   Barriers to discharge        Co-evaluation               End of Session   Activity Tolerance: Patient limited by pain;Patient limited by fatigue Patient left: in bed;with call bell/phone within reach;with family/visitor present;with bed alarm set Nurse Communication: Mobility status         Time: [id number] PT Time Calculation (min) (ACUTE ONLY): 21 min   Charges:   PT Evaluation $PT Eval Moderate Complexity: 1 Procedure     PT G CodesClearnce Sorrel [name] [name] 10/05/2016, 2:39 PM [name] [name], [place],  DPT [phone]

## 2016-10-05 NOTE — Discharge Summary (Signed)
Name: [name] [name] MRN: [id number] DOB: [birth year] 56 y.o. PCP: Milagros Loll, MD  Date of Admission: 10/04/2016  3:45 PM Date of Discharge: 10/05/2016 Attending Physician: Aldine Contes, MD  Discharge Diagnosis: 1. Hepatic Encephalopathy   Discharge Medications: Allergies as of 10/05/2016      Reactions   Ibuprofen Other (See Comments)   ulcers   Tylenol [acetaminophen] Other (See Comments)   2/2 liver damage   Amitriptyline Other (See Comments)   hallucinations      Medication List    TAKE these medications   albuterol 108 (90 Base) MCG/ACT inhaler Commonly known as:  PROVENTIL HFA;VENTOLIN HFA Inhale 2 puffs into the lungs every 6 (six) hours as needed for wheezing or shortness of breath.   albuterol (2.5 MG/3ML) 0.083% nebulizer solution Commonly known as:  PROVENTIL Take 3 mLs (2.5 mg total) by nebulization every 6 (six) hours as needed for wheezing or shortness of breath.   budesonide-formoterol 80-4.5 MCG/ACT inhaler Commonly known as:  SYMBICORT Inhale 2 puffs into the lungs 2 (two) times daily.   doxepin 25 MG capsule Commonly known as:  SINEQUAN Take 25 mg by mouth at bedtime.   lactulose 10 GM/15ML solution Commonly known as:  CHRONULAC Take 30 mLs (20 g total) by mouth 3 (three) times daily.   Melatonin 1 MG Caps Take 1 mg by mouth at bedtime.   meloxicam 7.5 MG tablet Commonly known as:  MOBIC Take 7.5 mg by mouth daily.   mometasone-formoterol 200-5 MCG/ACT Aero Commonly known as:  DULERA Inhale 2 puffs into the lungs 2 (two) times daily.   pantoprazole 40 MG tablet Commonly known as:  PROTONIX Take 1 tablet (40 mg total) by mouth daily.   potassium chloride SA 20 MEQ tablet Commonly known as:  K-DUR,KLOR-CON Take 1 tablet (20 mEq total) by mouth daily.   propranolol 20 MG tablet Commonly known as:  INDERAL Take 1 tablet (20 mg total) by mouth daily.   rifaximin 550 MG Tabs tablet Commonly known as:  XIFAXAN TAKE 1 TABLET (550  MG TOTAL) BY MOUTH TWO   (TWO) TIMES DAILY. What changed:  Another medication with the same name was added. Make sure you understand how and when to take each.   rifaximin 550 MG Tabs tablet Commonly known as:  XIFAXAN Take 1 tablet (550 mg total) by mouth 2 (two) times daily. What changed:  You were already taking a medication with the same name, and this prescription was added. Make sure you understand how and when to take each.   spironolactone 100 MG tablet Commonly known as:  ALDACTONE Take 1 tablet (100 mg total) by mouth daily. What changed:  medication strength  how much to take   tiotropium 18 MCG inhalation capsule Commonly known as:  SPIRIVA HANDIHALER Place 1 capsule (18 mcg total) into inhaler and inhale daily.   traMADol 50 MG tablet Commonly known as:  ULTRAM Take 1 tablet (50 mg total) by mouth every 6 (six) hours as needed for severe pain.   VOLTAREN 1 % Gel Generic drug:  diclofenac sodium APPLY 4 G TOPICALLY 4 (FOUR) TIMES DAILY.       Disposition and follow-up:   [name] was discharged from [facility] in Stable condition.  At the hospital follow up visit please address:  1.  Hepatic Encephalopathy: Due to non compliance with lactulose x 1 month. Please encourage medication compliance.   2.  Labs / imaging needed at time of follow-up: None  3.  Pending labs/ test needing follow-up: Ascites fluid culture negative for growth (final)  Follow-up Appointments: Follow-up Information    [place]. Schedule an appointment as soon as possible for a visit in 1 week(s).   Why:  If you do not hear from the clinic on Monday, please call to schedule an appointment Contact information: [address] [place] [facility] Course by problem list:  1. Hepatic Encephalopathy: In the setting of alcoholic cirrhosis and worsening abdominal ascites. Patient was admitted  from clinic after presenting with recurrent falls and worsening abdominal ascites concerning for hepatic encephalopathy. She was recently admitted 11/1 for hepatic encephalopathy and discharged on 20 mg TID of lactulose. She reported in clinic that she was compliant with her lactulose and had been taking it daily, however, on admission her husband reported that her lactulose never got filled by the pharmacy and was not in her medication bag at home. On evaluation she was found to have grade 1 hepatic encephalopathy with mild cognitive deficits and grade 1 asterixis on exam. She was afebrile and hemodynamically stable. No leukocytosis. She underwent bedside ultrasound guided paracentesis with 1.7 L of fluid removed. She was started on empiric ceftriaxone and her home lactulose was restarted. Home rifaximin, spironolactone, and propanolol (varices) were also continued. Ascitic fluid analysis showed no signs of infection and ceftriaxone was subsequently discontinued. She was discharged on lasix 20 mg BID, lactulose 20 g TID, and rifaximin 550 mg BID. New scripts were sent to her pharmacy. Home PT/OT/HH were also ordered. Discharged with plans for PCP follow up. Encouraged medication compliance.   Discharge Vitals:   BP 94/62 (BP Location: Left Arm)   Pulse 77   Temp 98 F (36.7 C) (Oral)   Resp 17   Ht 5\' 4"  (1.626 m)   Wt 222 lb 1.5 oz (100.7 kg)   SpO2 (!) 84%   BMI 38.12 kg/m   Pertinent Labs, Studies, and Procedures:   10/04/2016 DG Chest 2 View: IMPRESSION: Left pleural effusion and adjacent atelectasis.  Discharge Instructions: Discharge Instructions    Call MD for:  persistant nausea and vomiting    Complete by:  As directed    Call MD for:  severe uncontrolled pain    Complete by:  As directed    Call MD for:  temperature >100.4    Complete by:  As directed    Diet - low sodium heart healthy    Complete by:  As directed    Discharge instructions    Complete by:  As directed     [name],  The fluid in your belly is coming from your liver cirrhosis. I would like you to take the Lasix 20 mg twice a day (in the morning and afternoon; at least 6 hours before bed) as well as the Spironolactone 100 mg daily. This will help you pee off any excess fluid. It will be important for you to follow up in clinic so we can continue to adjust your medications as needed to keep the fluid from causing problems.   I have also sent in a prescription for Lactulose and Rifaximin. These are very important medications to keep you from accumulating toxins that the liver normally filters out but your liver can't do because of the damage. I have sent in a prescription for Lactulose 20 g three times a day. Our goal is to get you to  have 2-3 bowel movements every day. If you are having more than than you can adjust the dose and take less with the goal to have 2-3 bowel movements. The Rifaximin is twice a day and does not need any adjusting.   I have sent a message to our clinic. They will call you Monday to schedule a follow up appointment. If you do not hear from the clinic Monday, please call to schedule an appointment. It is very important you be seen after the hospitalization so we can continue to adjust your medications as needed.   Increase activity slowly    Complete by:  As directed       Signed: Velna Ochs, MD 10/12/2016, 2:48 PM   Pager: [phone]

## 2016-10-05 NOTE — Progress Notes (Addendum)
   Subjective:  Feeling better this morning. Confusion improving. Still reports feeling cold but not having any chills. Breathing improved after paracentesis yesterday. Husband at bedside asking about her lactulose prescription and wants to make sure she has a Rx before leaving the hospital.   Objective:  Vital signs in last 24 hours: Vitals:   10/05/16 0311 10/05/16 0501 10/05/16 0820 10/05/16 0943  BP:  126/65  (!) 95/56  Pulse:  98  79  Resp:  20    Temp:  98.2 F (36.8 C)    TempSrc:  Oral    SpO2:  95% 94% (!) 79%  Weight: 222 lb 1.5 oz (100.7 kg)     Height:       Constitutional: NAD, appears comfortable Cardiovascular: RRR, no murmurs, rubs, or gallops.  Pulmonary/Chest: CTAB, no wheezes, rales, or rhonchi.  Abdominal: Distended, soft and non tender. Normal bowel sounds. Extremities: Bilateral venous stasis dermatitis with open ulcerations on the Left lower extremity. Right upper extremity with a 3cm open wound, surrounding erythema, no drainage, central cavity with good granulation tissue. Right big toe with necrotic ulceration, no drainage.  Neurological: A&Ox3, CN II - XII grossly intact.  Skin: Large ecchymosis on central back Psychiatric: Normal mood and affect  Assessment/Plan:  Ascites with Hepatic Encephalopathy: Underwent paracentesis yesterday with 1.7 L fluid removed. Symptomatically improved following procedure with improved SOB and abdominal discomfort. Started on ceftriaxone after procedure for SBP prophylaxis. Vital signs stable overnight; afebrile, mildly tachycardic in the 90s, BP 0000000 systolic. Peritoneal fluid gram stain with WBC present, primarily mononuclear with no organisms seen. Culture with NGTD. Low suspicion for SBP at this time and will D/C ABX. SAAG 1.2 consistent with portal hypertension. Appears to have not been getting her Lactulose from the pharmacy. Restarted Lactulose and Rifaximin here. AAOx3 today. Some slow mentation but improving.  --  S/p paracentesis with 1.7L removed, f/u culture results  -- Continue home lactulose 20 g TID -- Rifaximin 550 mg BID -- D/C Ceftriaxone, no signs of SBP currently   -- Continue home propanolol 20 mg daily for esophageal varices  -- Continue spironolactone 100 mg daily and Lasix 40 mg daily  Elevated TSH TSH 5.18 today. Will need repeat TSH and T4 at follow up.   Right Upper Extremity Wound: Secondary to fall. No signs of infection at this time. -- Wound care consult appreciated  Recurrent Falls: Likely due to hepatic encephalopathy. Management as above. -- PT / OT consult  -- Continue home Voltaren gel  HFpEF: -- Continue home Lasix 20 mg BID  COPD: -- Continue home Spiriva daily  -- Continue home Dulera  BID  Anxiety: -- Continue home ativan 1 mg BID -- Continue home atarax 25 mg TID -- Continue home trazadone 50 mg QHS -- Continue Buspar 15 mg BID  GERD:  -- Continue home protonix 40 mg daily   Chronic Pain:  -- Continue home tramadol 50 mg q6 prn  FEN: no fluids, replete lytes prn, regular diet VTE ppx: SCDs Code Status: FULL   Dispo: Anticipated discharge this afternoon.  Maryellen Pile, MD 10/05/2016, 1:42 PM Pager: [phone]

## 2016-10-08 ENCOUNTER — Telehealth: Payer: Self-pay | Admitting: Pulmonary Disease

## 2016-10-08 LAB — BODY FLUID CULTURE: CULTURE: NO GROWTH

## 2016-10-08 NOTE — Progress Notes (Signed)
Internal Medicine Clinic Attending  I saw and evaluated the patient.  I personally confirmed the key portions of the history and exam documented by Dr. Krall and I reviewed pertinent patient test results.  The assessment, diagnosis, and plan were formulated together and I agree with the documentation in the resident's note.  

## 2016-10-08 NOTE — Telephone Encounter (Signed)
APT. REMINDER CALL, NO ANSWER, NO VOICEMAIL °

## 2016-10-09 ENCOUNTER — Ambulatory Visit (INDEPENDENT_AMBULATORY_CARE_PROVIDER_SITE_OTHER): Payer: Medicaid Other | Admitting: Pulmonary Disease

## 2016-10-09 ENCOUNTER — Encounter: Payer: Self-pay | Admitting: Pulmonary Disease

## 2016-10-09 VITALS — BP 107/71 | HR 75 | Temp 98.2°F | Ht 64.0 in | Wt 218.0 lb

## 2016-10-09 DIAGNOSIS — K7031 Alcoholic cirrhosis of liver with ascites: Secondary | ICD-10-CM | POA: Diagnosis not present

## 2016-10-09 DIAGNOSIS — F1021 Alcohol dependence, in remission: Secondary | ICD-10-CM | POA: Diagnosis not present

## 2016-10-09 DIAGNOSIS — Z87891 Personal history of nicotine dependence: Secondary | ICD-10-CM

## 2016-10-09 DIAGNOSIS — Z5189 Encounter for other specified aftercare: Secondary | ICD-10-CM | POA: Diagnosis not present

## 2016-10-09 DIAGNOSIS — K7682 Hepatic encephalopathy: Secondary | ICD-10-CM

## 2016-10-09 DIAGNOSIS — J9601 Acute respiratory failure with hypoxia: Secondary | ICD-10-CM | POA: Diagnosis not present

## 2016-10-09 DIAGNOSIS — R188 Other ascites: Principal | ICD-10-CM

## 2016-10-09 DIAGNOSIS — Z9889 Other specified postprocedural states: Secondary | ICD-10-CM

## 2016-10-09 DIAGNOSIS — K7290 Hepatic failure, unspecified without coma: Secondary | ICD-10-CM

## 2016-10-09 DIAGNOSIS — K7460 Unspecified cirrhosis of liver: Secondary | ICD-10-CM

## 2016-10-09 DIAGNOSIS — K7040 Alcoholic hepatic failure without coma: Secondary | ICD-10-CM

## 2016-10-09 MED ORDER — FUROSEMIDE 80 MG PO TABS: 80.0000 mg | ORAL_TABLET | Freq: Every day | ORAL | 0 refills | Status: DC

## 2016-10-09 NOTE — Patient Instructions (Signed)
Take Lasix once a day Take the rest of your medications as prescribed Follow up in 1-2 weeks

## 2016-10-09 NOTE — Progress Notes (Signed)
   CC: Cirrhosis follow up  HPI:  [name] [name] is a 55 y.o. woman with hypertension, hepatitis C, alcohol cirrhosis, COPD, GERD, depression/anxiety presenting for follow up of hepatic encephalopathy.  She was hospitalized 12/8. She underwent paracentesis with 1.7 L removed. No SBP. She has been taking her lactulose and reports 2-3 bowel movements per day. She reports her abdomen feels improved. She has some leaking from the paracentesis site.   Past Medical History:  Diagnosis Date  . Adverse drug effect - [facility] 03/07/2015  . Alcoholic cirrhosis ([place])   . Allergy   . Anxiety   . Arthritis   . cervical Cancer 1999  . Cholelithiasis   . COPD (chronic obstructive pulmonary disease) ([place])   . Decompensated hepatic cirrhosis ([place]) 03/07/2015   Side effect of Vikera  . Depression   . Esophageal varices ([place])   . Esophagitis 2010  . Fatty liver   . Gastritis   . GERD (gastroesophageal reflux disease)   . Heart palpitations   . Hepatitis C    chronic hepatitis C and Steatohepatitis (hep grade 2, stage 2-3) per 05/13/08 liver biopsy  . Hypertension   . Obesity   . Portal hypertension ([place])   . Shortness of breath   . Splenomegaly   . Tubulovillous adenoma of colon   . Visual hallucination    since 09/2010/notes 10/30/2012    Review of Systems:   Denies fevers or chills Denies chest pain  Physical Exam:  Vitals:   10/09/16 1349  BP: 107/71  Pulse: 75  Temp: 98.2 F (36.8 C)  TempSrc: Oral  SpO2: 90%  Weight: 218 lb (98.9 kg)  Height: 5\' 4"  (1.626 m)   General Apperance: NAD HEENT: Normocephalic, atraumatic, anicteric sclera Neck: Supple, trachea midline Lungs: Clear to auscultation bilaterally. No wheezes, rhonchi or rales Heart: Regular rate and rhythm, no murmur/rub/[name] Abdomen: Soft, nontender, +distended with fluid wave, no rebound/guarding Extremities: Warm and well perfused, 2+ edema to upper thighs Skin: Site of paracentesis without obvious  drainage. No surrounding erythema. Neurologic: Alert and interactive. Oriented x 3. No gross deficits.  Assessment & Plan:   See Encounters Tab for problem based charting.  Patient discussed with Dr. Evette Doffing

## 2016-10-10 ENCOUNTER — Other Ambulatory Visit (HOSPITAL_COMMUNITY): Payer: Self-pay

## 2016-10-10 DIAGNOSIS — F331 Major depressive disorder, recurrent, moderate: Secondary | ICD-10-CM

## 2016-10-10 LAB — BMP8+ANION GAP
ANION GAP: 20 mmol/L — AB (ref 10.0–18.0)
BUN / CREAT RATIO: 9 (ref 9–23)
BUN: 6 mg/dL (ref 6–24)
CO2: 19 mmol/L (ref 18–29)
Calcium: 8.8 mg/dL (ref 8.7–10.2)
Chloride: 109 mmol/L — ABNORMAL HIGH (ref 96–106)
Creatinine, Ser: 0.69 mg/dL (ref 0.57–1.00)
GFR calc Af Amer: 112 mL/min/{1.73_m2} (ref >59)
GFR, EST NON AFRICAN AMERICAN: 97 mL/min/{1.73_m2} (ref >59)
Glucose: 126 mg/dL — ABNORMAL HIGH (ref 65–99)
Potassium: 4.2 mmol/L (ref 3.5–5.2)
SODIUM: 148 mmol/L — AB (ref 134–144)

## 2016-10-10 MED ORDER — BUSPIRONE HCL 15 MG PO TABS: 15.0000 mg | ORAL_TABLET | ORAL | 0 refills | Status: DC

## 2016-10-10 MED ORDER — LORAZEPAM 1 MG PO TABS: 1.0000 mg | ORAL_TABLET | Freq: Two times a day (BID) | ORAL | 0 refills | Status: DC

## 2016-10-10 MED ORDER — TRAZODONE HCL 50 MG PO TABS: 50.0000 mg | ORAL_TABLET | Freq: Every day | ORAL | 0 refills | Status: DC

## 2016-10-10 MED ORDER — HYDROXYZINE HCL 25 MG PO TABS: 25.0000 mg | ORAL_TABLET | Freq: Three times a day (TID) | ORAL | 0 refills | Status: DC

## 2016-10-10 NOTE — Assessment & Plan Note (Signed)
Assessment: O2 sat at rest today 90%. Minimally ambulatory but has not had ambulatory sats tested.  Plan: Attempt to check ambulatory sats at follow up in 1 week

## 2016-10-10 NOTE — Assessment & Plan Note (Signed)
Assessment: Mental status improved. Having 2-3 BM per day.  Plan: Continue lactulose with goal BM 2-3 per day

## 2016-10-10 NOTE — Progress Notes (Signed)
Internal Medicine Clinic Attending  Case discussed with Dr. Krall at the time of the visit.  We reviewed the resident's history and exam and pertinent patient test results.  I agree with the assessment, diagnosis, and plan of care documented in the resident's note.  

## 2016-10-10 NOTE — Assessment & Plan Note (Signed)
Assessment: Abdomen still has ascites but less tense. MELD score of 20.  Plan:  Will likely need to offer paracentesis at next visit She has follow up scheduled with her hepatologist soon Increase Lasix to 80mg  daily Continue spironolactone 100mg  daily

## 2016-10-14 ENCOUNTER — Telehealth: Payer: Self-pay | Admitting: Licensed Clinical Social Worker

## 2016-10-14 DIAGNOSIS — K7290 Hepatic failure, unspecified without coma: Secondary | ICD-10-CM

## 2016-10-14 DIAGNOSIS — K7682 Hepatic encephalopathy: Secondary | ICD-10-CM

## 2016-10-14 DIAGNOSIS — J9601 Acute respiratory failure with hypoxia: Secondary | ICD-10-CM

## 2016-10-14 NOTE — Telephone Encounter (Addendum)
Ms. Buehrer was referred to CSW as pt was ordered [facility] PT during hospitalization.  CSW inquired with PCP, if [facility] RN is needed; as pt's insurance may not cover [facility] PT based on diagnosis on original order. CSW placed call to Ms. Magdaleno to inquire if pt had additional knowledge.  Message left requesting return call.

## 2016-10-16 ENCOUNTER — Ambulatory Visit: Payer: Self-pay

## 2016-10-17 NOTE — Telephone Encounter (Signed)
CSW placed call to [name] to f/u on [facility] PT/OT referral placed during hospitalization.  PCP requested f/u as services had not been initiated.  CSW spoke with Case Management during hospitalization.  CSW left message with request for pt to contact this worker for referral f/u and agency choice for [facility] services.  Awaiting response back from [name].

## 2016-10-18 NOTE — Telephone Encounter (Signed)
CSW placed call to [name].  Pt confirms she has not had [facility] services since hospital d/c.  Ms. Wohlwend has no agency preference.  Referral to [facility] awaiting confirmation.

## 2016-10-23 ENCOUNTER — Ambulatory Visit: Payer: Self-pay

## 2016-10-23 ENCOUNTER — Encounter: Payer: Self-pay | Admitting: Pulmonary Disease

## 2016-10-23 NOTE — Telephone Encounter (Signed)
[name] [name] able to accept.  [name] [name] has been unable to make contact during holiday weekend.

## 2016-10-30 ENCOUNTER — Telehealth: Payer: Self-pay

## 2016-10-30 NOTE — Telephone Encounter (Signed)
Attempted to call patient regarding referral phone busy no voice mail.

## 2016-11-05 ENCOUNTER — Encounter: Payer: Self-pay | Admitting: Internal Medicine

## 2016-11-07 ENCOUNTER — Telehealth: Payer: Self-pay | Admitting: Pulmonary Disease

## 2016-11-07 NOTE — Telephone Encounter (Signed)
APT. REMINDER CALL, LMTCB °

## 2016-11-08 ENCOUNTER — Ambulatory Visit: Payer: Self-pay

## 2016-11-11 ENCOUNTER — Other Ambulatory Visit: Payer: Self-pay | Admitting: Pulmonary Disease

## 2016-11-11 ENCOUNTER — Other Ambulatory Visit (HOSPITAL_COMMUNITY): Payer: Self-pay | Admitting: Psychiatry

## 2016-11-11 DIAGNOSIS — F331 Major depressive disorder, recurrent, moderate: Secondary | ICD-10-CM

## 2016-11-14 ENCOUNTER — Ambulatory Visit (HOSPITAL_COMMUNITY): Payer: Self-pay | Admitting: Psychiatry

## 2016-11-18 ENCOUNTER — Encounter: Payer: Self-pay | Admitting: Internal Medicine

## 2016-11-18 ENCOUNTER — Telehealth: Payer: Self-pay

## 2016-11-18 NOTE — Telephone Encounter (Signed)
Call to patient regarding referral for home health. Made patient aware that [facility] closed her case because they were unable to contact her.  Patient reports reports she still wants health health. Advised patient that I will resend referral to [facility] the will contact her by phone

## 2016-11-25 ENCOUNTER — Telehealth: Payer: Self-pay | Admitting: Pulmonary Disease

## 2016-11-25 NOTE — Telephone Encounter (Signed)
Received confirmation back that certified letter was signed for on 11/21/16.

## 2016-11-28 ENCOUNTER — Ambulatory Visit (HOSPITAL_COMMUNITY)
Admission: RE | Admit: 2016-11-28 | Discharge: 2016-11-28 | Disposition: A | Discharge status: Home / Self Care | Payer: Medicaid Other | Source: Ambulatory Visit | Attending: Pulmonary Disease | Admitting: Pulmonary Disease

## 2016-11-28 ENCOUNTER — Encounter: Payer: Self-pay | Admitting: Pulmonary Disease

## 2016-11-28 ENCOUNTER — Ambulatory Visit (INDEPENDENT_AMBULATORY_CARE_PROVIDER_SITE_OTHER): Payer: Medicaid Other | Admitting: Pulmonary Disease

## 2016-11-28 VITALS — BP 112/81 | HR 71 | Temp 97.8°F | Ht 63.0 in

## 2016-11-28 DIAGNOSIS — W19XXXA Unspecified fall, initial encounter: Secondary | ICD-10-CM

## 2016-11-28 DIAGNOSIS — M5030 Other cervical disc degeneration, unspecified cervical region: Secondary | ICD-10-CM | POA: Insufficient documentation

## 2016-11-28 DIAGNOSIS — E039 Hypothyroidism, unspecified: Secondary | ICD-10-CM | POA: Diagnosis not present

## 2016-11-28 DIAGNOSIS — F1021 Alcohol dependence, in remission: Secondary | ICD-10-CM

## 2016-11-28 DIAGNOSIS — R51 Headache: Secondary | ICD-10-CM | POA: Insufficient documentation

## 2016-11-28 DIAGNOSIS — M4802 Spinal stenosis, cervical region: Secondary | ICD-10-CM | POA: Insufficient documentation

## 2016-11-28 DIAGNOSIS — Z9181 History of falling: Secondary | ICD-10-CM | POA: Diagnosis not present

## 2016-11-28 DIAGNOSIS — B182 Chronic viral hepatitis C: Secondary | ICD-10-CM | POA: Diagnosis not present

## 2016-11-28 DIAGNOSIS — M542 Cervicalgia: Secondary | ICD-10-CM | POA: Diagnosis not present

## 2016-11-28 DIAGNOSIS — Z87891 Personal history of nicotine dependence: Secondary | ICD-10-CM

## 2016-11-28 DIAGNOSIS — N179 Acute kidney failure, unspecified: Secondary | ICD-10-CM | POA: Diagnosis not present

## 2016-11-28 DIAGNOSIS — R296 Repeated falls: Secondary | ICD-10-CM | POA: Diagnosis not present

## 2016-11-28 DIAGNOSIS — K7682 Hepatic encephalopathy: Secondary | ICD-10-CM

## 2016-11-28 DIAGNOSIS — J9601 Acute respiratory failure with hypoxia: Secondary | ICD-10-CM

## 2016-11-28 DIAGNOSIS — J90 Pleural effusion, not elsewhere classified: Secondary | ICD-10-CM | POA: Insufficient documentation

## 2016-11-28 DIAGNOSIS — K7290 Hepatic failure, unspecified without coma: Secondary | ICD-10-CM | POA: Diagnosis not present

## 2016-11-28 DIAGNOSIS — K7031 Alcoholic cirrhosis of liver with ascites: Secondary | ICD-10-CM | POA: Diagnosis not present

## 2016-11-28 DIAGNOSIS — Z79899 Other long term (current) drug therapy: Secondary | ICD-10-CM

## 2016-11-28 LAB — CBC WITH DIFFERENTIAL/PLATELET
BASOS PCT: 0 %
Basophils Absolute: 0 10*3/uL (ref 0.0–0.1)
EOS PCT: 2 %
Eosinophils Absolute: 0.2 10*3/uL (ref 0.0–0.7)
HEMATOCRIT: 38.2 % (ref 36.0–46.0)
Hemoglobin: 12.6 g/dL (ref 12.0–15.0)
LYMPHS PCT: 16 %
Lymphs Abs: 1.5 10*3/uL (ref 0.7–4.0)
MCH: 32.6 pg (ref 26.0–34.0)
MCHC: 33 g/dL (ref 30.0–36.0)
MCV: 99 fL (ref 78.0–100.0)
MONO ABS: 1.2 10*3/uL — AB (ref 0.1–1.0)
MONOS PCT: 12 %
NEUTROS ABS: 6.8 10*3/uL (ref 1.7–7.7)
Neutrophils Relative %: 70 %
PLATELETS: 139 10*3/uL — AB (ref 150–400)
RBC: 3.86 MIL/uL — ABNORMAL LOW (ref 3.87–5.11)
RDW: 20.9 % — AB (ref 11.5–15.5)
WBC: 9.7 10*3/uL (ref 4.0–10.5)

## 2016-11-28 LAB — BODY FLUID CELL COUNT WITH DIFFERENTIAL: WBC FLUID: 10 uL (ref 0–1000)

## 2016-11-28 LAB — COMPREHENSIVE METABOLIC PANEL
ALK PHOS: 186 U/L — AB (ref 38–126)
ALT: 21 U/L (ref 14–54)
AST: 50 U/L — AB (ref 15–41)
Albumin: 2.2 g/dL — ABNORMAL LOW (ref 3.5–5.0)
Anion gap: 9 (ref 5–15)
BUN: 15 mg/dL (ref 6–20)
CALCIUM: 8.3 mg/dL — AB (ref 8.9–10.3)
CHLORIDE: 105 mmol/L (ref 101–111)
CO2: 26 mmol/L (ref 22–32)
Creatinine, Ser: 1.4 mg/dL — ABNORMAL HIGH (ref 0.44–1.00)
GFR, EST AFRICAN AMERICAN: 47 mL/min — AB (ref >60)
GFR, EST NON AFRICAN AMERICAN: 41 mL/min — AB (ref >60)
Glucose, Bld: 144 mg/dL — ABNORMAL HIGH (ref 65–99)
Potassium: 3.5 mmol/L (ref 3.5–5.1)
Sodium: 140 mmol/L (ref 135–145)
Total Bilirubin: 3.5 mg/dL — ABNORMAL HIGH (ref 0.3–1.2)
Total Protein: 6.7 g/dL (ref 6.5–8.1)

## 2016-11-28 LAB — PROTIME-INR
INR: 1.62
PROTHROMBIN TIME: 19.4 s — AB (ref 11.4–15.2)

## 2016-11-28 LAB — T4, FREE: Free T4: 1.05 ng/dL (ref 0.61–1.12)

## 2016-11-28 LAB — TSH: TSH: 20.583 u[IU]/mL — ABNORMAL HIGH (ref 0.350–4.500)

## 2016-11-28 MED ORDER — LEVOTHYROXINE SODIUM 50 MCG PO TABS: 50.0000 ug | ORAL_TABLET | Freq: Every day | ORAL | 1 refills | Status: DC

## 2016-11-28 MED ORDER — TRAMADOL HCL 50 MG PO TABS: 50.0000 mg | ORAL_TABLET | Freq: Four times a day (QID) | ORAL | 0 refills | Status: DC | PRN

## 2016-11-28 NOTE — Patient Instructions (Addendum)
Start taking the thyroid medication daily before breakfast.  Take your lactulose three times a day. You should be having two to three bowel movements per day. Follow up in 3 to 4 weeks

## 2016-11-28 NOTE — Assessment & Plan Note (Addendum)
Assessment: Ascites due to her alcoholic cirrhosis. Abdomen more distended today. Does report taking the higher dose of Lasix.  Plan: Stat CMP, PT/INR, CBC If INR ok, will do paracentesis in clinic Needs to follow up with her hepatologist  ADDENDUM: INR 1.6 and WBC within normal limits. Plt improved to 139 from 97.  Paracentesis Procedure Note  Indications:  Ascites  Procedure Details  Informed consent was obtained after explanation of the risks and benefits of the procedure, refer to the consent documentation.  Time-out was performed immediately prior to the procedure.  The head of the bed was placed 30-45 degrees above level and the meniscus of the ascites was evaluated by bedside ultrasound and a large area of ascitic fluid was identified in the right lower quadrant.  Local anesthesia with 1 percent lidocaine was introduced subcutaneously then deep to the skin until the parietal peritoneum was anesthetized. A parcentesis needle was introduced into this site until ascitic fluid was encountered.  Ascitic fluid and the needle were removed with minimal bleeding.  A sterile bandage was placed after holding pressure.   Findings: 4058mL of straw colored ascites fluid was obtained.  The ascites fluid was sent for cell count and culture.        Condition:   The patient tolerated the procedure well and remains in the same condition as pre-procedure.  Complications: None; patient tolerated the procedure well.

## 2016-11-28 NOTE — Assessment & Plan Note (Signed)
Assessment: Cr up to 1.4 from a baseline of 0.7. Likely due to higher dose of Lasix. Could also be from her liver disease.  Plan: Will continue her Lasix at current dose and closely monitor her renal function.

## 2016-11-28 NOTE — Assessment & Plan Note (Addendum)
Seems to be worse as she is sleepier and has more confusion at home. Increased falls. Has 1-2 BM per day. Worsening probably due to noncompliance of her lactulose. No fevers, WBC normal and abdomen nontender making infection/SBP unlikely.  Will have her increase her Lactulose to 3 times per day for 2-3 BM per day.

## 2016-11-28 NOTE — Assessment & Plan Note (Signed)
Child Class C and MELD score of 20.  Encouraged her to follow up with her hepatologist.

## 2016-11-28 NOTE — Assessment & Plan Note (Addendum)
HPI: She has been falling frequently at home. Last fell this morning, and she is unable to tell me what she hit but does note that her neck hurts. She is holding her head in an extended position. She has been having left sided head pain. She has had problems with her memory. Of note, she is coagulopathic with her liver disease and last PT/INR abnormal. Tender to palpation along cervical spine. Oriented x 2.  Assessment: Concern for cervical spine pathology. Also concern for head trauma.   Plan:  -Obtain CT head and cervical spine without contrast stat. -She wants PCS, will place referral  ADDENDUM: CT head and cervical spine without acute abnormalities. Noted moderate pleural effusion. This was seen on CXR in December. Will monitor for now as she is asymptomatic from it. Likely related to her liver disease.  Per RN report, patient's husband demanded tramadol refill prescription when patient was taken back out to waiting room.   Patient also told me that her family makes fun of her at home.   Will work on getting home health eval of home environment. May need adult protective services if we cannot get a home health eval.

## 2016-11-28 NOTE — Progress Notes (Signed)
   CC: fall  HPI:  [name] [name] is a 56 year old woman with history of hypertension, hepatitis C, alcohol cirrhosis, COPD, GERD, depression/anxiety presenting for follow up of ascites.  She has not seen her liver doctor. Was supposed to see him last week but could not make it due to the snow.   She has been falling frequently - at least twice a week. Usually is trying to walk when this happens. No chest pain or trouble breathing prior to falling. She hurt her neck this morning with her fall. She is otherwise nonambulatory. She has head pain on the left side. She occasionally has dizziness before she falls - feels like spinning. She feels that her left leg is weaker than her right. She has had problems with her memory. Having nightmares. She takes lactulose one time a day. She has 1-2 BM a day - formed. Denies not having a BM in a day. Feels like everyone is making fun of her. Her abdominal distention is worsening. She takes Lasix every day.    Past Medical History:  Diagnosis Date  . Adverse drug effect - [facility] 03/07/2015  . Alcoholic cirrhosis ([place])   . Allergy   . Anxiety   . Arthritis   . cervical Cancer 1999  . Cholelithiasis   . COPD (chronic obstructive pulmonary disease) ([place])   . Decompensated hepatic cirrhosis ([place]) 03/07/2015   Side effect of Vikera  . Depression   . Esophageal varices ([place])   . Esophagitis 2010  . Fatty liver   . Gastritis   . GERD (gastroesophageal reflux disease)   . Heart palpitations   . Hepatitis C    chronic hepatitis C and Steatohepatitis (hep grade 2, stage 2-3) per 05/13/08 liver biopsy  . Hypertension   . Obesity   . Portal hypertension ([place])   . Shortness of breath   . Splenomegaly   . Tubulovillous adenoma of colon   . Visual hallucination    since 09/2010/notes 10/30/2012    Review of Systems:   She has chills but no fevers She has abdominal pain  Physical Exam:  Vitals:   11/28/16 1025  BP: 112/81  Pulse: 71  Temp: 97.8 F  (36.6 C)  TempSrc: Oral  SpO2: 96%  Height: 5\' 3"  (1.6 m)   General Apperance: NAD HEENT: Normocephalic, mildly icteric sclera Neck: Tender to palpation along C spine, trachea midline Lungs: Clear to auscultation bilaterally. No wheezes, rhonchi or rales. Breathing comfortably on room air Heart: Regular rate and rhythm Abdomen: Mildly tense, nontender, +distended, no rebound/guarding Extremities: Warm and well perfused, 2+ BLE edema Skin: Ecchymoses on arms Neurologic: Somewhat sleepy but interactive. Oriented to location and self (does not remember year but does not month). 4/5 bilateral upper extremity. Weak hip flexors. 3 to 4/5 knee extension bilaterally   Assessment & Plan:   See Encounters Tab for problem based charting.  Patient discussed with Dr. Daryll Drown

## 2016-11-28 NOTE — Assessment & Plan Note (Signed)
TSH today >10 at 20.583.  Check Free T4 and start Synthroid 11mcg daily.  Recheck TSH in 6 weeks

## 2016-11-28 NOTE — Assessment & Plan Note (Signed)
Resting O2 sat within normal limits. Cannot evaluate ambulatory sat at this time because she is unable to ambulate more than a few steps.

## 2016-11-29 ENCOUNTER — Encounter (HOSPITAL_COMMUNITY): Payer: Self-pay

## 2016-11-29 ENCOUNTER — Emergency Department (HOSPITAL_COMMUNITY): Payer: Medicaid Other

## 2016-11-29 ENCOUNTER — Inpatient Hospital Stay (HOSPITAL_COMMUNITY)
Admission: EM | Admit: 2016-11-29 | Discharge: 2016-12-01 | DRG: 442 | Disposition: A | Discharge status: Home / Self Care | Payer: Medicaid Other | Attending: Internal Medicine | Admitting: Internal Medicine

## 2016-11-29 DIAGNOSIS — D689 Coagulation defect, unspecified: Secondary | ICD-10-CM | POA: Diagnosis present

## 2016-11-29 DIAGNOSIS — K219 Gastro-esophageal reflux disease without esophagitis: Secondary | ICD-10-CM | POA: Diagnosis present

## 2016-11-29 DIAGNOSIS — K7031 Alcoholic cirrhosis of liver with ascites: Secondary | ICD-10-CM | POA: Diagnosis present

## 2016-11-29 DIAGNOSIS — R278 Other lack of coordination: Secondary | ICD-10-CM | POA: Diagnosis present

## 2016-11-29 DIAGNOSIS — R4182 Altered mental status, unspecified: Secondary | ICD-10-CM | POA: Diagnosis present

## 2016-11-29 DIAGNOSIS — J449 Chronic obstructive pulmonary disease, unspecified: Secondary | ICD-10-CM | POA: Diagnosis present

## 2016-11-29 DIAGNOSIS — Z7951 Long term (current) use of inhaled steroids: Secondary | ICD-10-CM

## 2016-11-29 DIAGNOSIS — Z9114 Patient's other noncompliance with medication regimen: Secondary | ICD-10-CM

## 2016-11-29 DIAGNOSIS — B1920 Unspecified viral hepatitis C without hepatic coma: Secondary | ICD-10-CM | POA: Diagnosis present

## 2016-11-29 DIAGNOSIS — N179 Acute kidney failure, unspecified: Secondary | ICD-10-CM | POA: Diagnosis present

## 2016-11-29 DIAGNOSIS — F1010 Alcohol abuse, uncomplicated: Secondary | ICD-10-CM | POA: Diagnosis present

## 2016-11-29 DIAGNOSIS — E876 Hypokalemia: Secondary | ICD-10-CM | POA: Diagnosis present

## 2016-11-29 DIAGNOSIS — J811 Chronic pulmonary edema: Secondary | ICD-10-CM | POA: Diagnosis present

## 2016-11-29 DIAGNOSIS — R32 Unspecified urinary incontinence: Secondary | ICD-10-CM | POA: Diagnosis present

## 2016-11-29 DIAGNOSIS — F419 Anxiety disorder, unspecified: Secondary | ICD-10-CM | POA: Diagnosis present

## 2016-11-29 DIAGNOSIS — Z87891 Personal history of nicotine dependence: Secondary | ICD-10-CM

## 2016-11-29 DIAGNOSIS — J90 Pleural effusion, not elsewhere classified: Secondary | ICD-10-CM | POA: Diagnosis present

## 2016-11-29 DIAGNOSIS — Z883 Allergy status to other anti-infective agents status: Secondary | ICD-10-CM

## 2016-11-29 DIAGNOSIS — Z79891 Long term (current) use of opiate analgesic: Secondary | ICD-10-CM

## 2016-11-29 DIAGNOSIS — I8500 Esophageal varices without bleeding: Secondary | ICD-10-CM | POA: Diagnosis present

## 2016-11-29 DIAGNOSIS — I10 Essential (primary) hypertension: Secondary | ICD-10-CM | POA: Diagnosis present

## 2016-11-29 DIAGNOSIS — Z9119 Patient's noncompliance with other medical treatment and regimen: Secondary | ICD-10-CM

## 2016-11-29 DIAGNOSIS — F329 Major depressive disorder, single episode, unspecified: Secondary | ICD-10-CM | POA: Diagnosis present

## 2016-11-29 DIAGNOSIS — E039 Hypothyroidism, unspecified: Secondary | ICD-10-CM | POA: Diagnosis present

## 2016-11-29 DIAGNOSIS — K766 Portal hypertension: Secondary | ICD-10-CM | POA: Diagnosis present

## 2016-11-29 DIAGNOSIS — K7290 Hepatic failure, unspecified without coma: Principal | ICD-10-CM | POA: Diagnosis present

## 2016-11-29 LAB — CBC
HCT: 38.1 % (ref 36.0–46.0)
Hemoglobin: 12.8 g/dL (ref 12.0–15.0)
MCH: 32.8 pg (ref 26.0–34.0)
MCHC: 33.6 g/dL (ref 30.0–36.0)
MCV: 97.7 fL (ref 78.0–100.0)
PLATELETS: 117 10*3/uL — AB (ref 150–400)
RBC: 3.9 MIL/uL (ref 3.87–5.11)
RDW: 20.6 % — AB (ref 11.5–15.5)
WBC: 8.1 10*3/uL (ref 4.0–10.5)

## 2016-11-29 LAB — BLOOD GAS, ARTERIAL
ACID-BASE EXCESS: 2.8 mmol/L — AB (ref 0.0–2.0)
BICARBONATE: 26.2 mmol/L (ref 20.0–28.0)
DRAWN BY: 422461
O2 Content: 2 L/min
O2 Saturation: 90.2 %
PH ART: 7.459 — AB (ref 7.350–7.450)
PO2 ART: 62.5 mmHg — AB (ref 83.0–108.0)
Patient temperature: 98.6
pCO2 arterial: 37.4 mmHg (ref 32.0–48.0)

## 2016-11-29 LAB — PATHOLOGIST SMEAR REVIEW

## 2016-11-29 LAB — URINALYSIS, ROUTINE W REFLEX MICROSCOPIC
GLUCOSE, UA: NEGATIVE mg/dL
HGB URINE DIPSTICK: NEGATIVE
KETONES UR: NEGATIVE mg/dL
LEUKOCYTES UA: NEGATIVE
Nitrite: NEGATIVE
PROTEIN: NEGATIVE mg/dL
Specific Gravity, Urine: 1.024 (ref 1.005–1.030)
pH: 7 (ref 5.0–8.0)

## 2016-11-29 LAB — CBG MONITORING, ED: Glucose-Capillary: 70 mg/dL (ref 65–99)

## 2016-11-29 LAB — COMPREHENSIVE METABOLIC PANEL
ALK PHOS: 134 U/L — AB (ref 38–126)
ALT: 23 U/L (ref 14–54)
AST: 58 U/L — AB (ref 15–41)
Albumin: 2.3 g/dL — ABNORMAL LOW (ref 3.5–5.0)
Anion gap: 5 (ref 5–15)
BUN: 17 mg/dL (ref 6–20)
CALCIUM: 8.3 mg/dL — AB (ref 8.9–10.3)
CO2: 29 mmol/L (ref 22–32)
CREATININE: 0.87 mg/dL (ref 0.44–1.00)
Chloride: 107 mmol/L (ref 101–111)
Glucose, Bld: 97 mg/dL (ref 65–99)
Potassium: 3.8 mmol/L (ref 3.5–5.1)
Sodium: 141 mmol/L (ref 135–145)
Total Bilirubin: 5.3 mg/dL — ABNORMAL HIGH (ref 0.3–1.2)
Total Protein: 6.6 g/dL (ref 6.5–8.1)

## 2016-11-29 LAB — LIPASE, BLOOD: Lipase: 26 U/L (ref 11–51)

## 2016-11-29 LAB — AMMONIA: AMMONIA: 60 umol/L — AB (ref 9–35)

## 2016-11-29 MED ORDER — METHYLPREDNISOLONE SODIUM SUCC 125 MG IJ SOLR
125.0000 mg | Freq: Once | INTRAMUSCULAR | Status: AC
  Administered 2016-11-29: 125 mg via INTRAVENOUS
  Filled 2016-11-29: qty 2

## 2016-11-29 MED ORDER — LACTULOSE 10 GM/15ML PO SOLN
30.0000 g | Freq: Once | ORAL | Status: AC
  Administered 2016-11-29: 30 g via ORAL
  Filled 2016-11-29: qty 60

## 2016-11-29 MED ORDER — IPRATROPIUM-ALBUTEROL 0.5-2.5 (3) MG/3ML IN SOLN
3.0000 mL | Freq: Once | RESPIRATORY_TRACT | Status: AC
  Administered 2016-11-29: 3 mL via RESPIRATORY_TRACT
  Filled 2016-11-29: qty 3

## 2016-11-29 NOTE — ED Triage Notes (Signed)
PT RECEIVED FROM HOME VIA EMS FOR CHRONIC ASCITES. PER EMS, WHEN THEY ARRIVED TO THE RESIDENCE, THE PT WAS LYING IN URINE AND BREATHING HEAVILY WITH OCCASIONAL GRUNTING. PT HAD A PARACENTESIS YESTERDAY. PT AAOX1.

## 2016-11-29 NOTE — ED Notes (Signed)
Brother to the bedside and reports that the patient has been in the bed all day today, not really eating or drinking, only saying "yes" or "no" to questions.

## 2016-11-29 NOTE — ED Notes (Signed)
Attempted 2nd iv start, unsuccessful x 1 attempt.
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
## 2016-11-30 ENCOUNTER — Other Ambulatory Visit: Payer: Self-pay

## 2016-11-30 ENCOUNTER — Observation Stay (HOSPITAL_BASED_OUTPATIENT_CLINIC_OR_DEPARTMENT_OTHER): Payer: Medicaid Other

## 2016-11-30 DIAGNOSIS — K7290 Hepatic failure, unspecified without coma: Secondary | ICD-10-CM | POA: Diagnosis present

## 2016-11-30 DIAGNOSIS — F1021 Alcohol dependence, in remission: Secondary | ICD-10-CM | POA: Diagnosis not present

## 2016-11-30 DIAGNOSIS — I10 Essential (primary) hypertension: Secondary | ICD-10-CM

## 2016-11-30 DIAGNOSIS — J449 Chronic obstructive pulmonary disease, unspecified: Secondary | ICD-10-CM

## 2016-11-30 DIAGNOSIS — Z886 Allergy status to analgesic agent status: Secondary | ICD-10-CM | POA: Diagnosis not present

## 2016-11-30 DIAGNOSIS — Z79891 Long term (current) use of opiate analgesic: Secondary | ICD-10-CM | POA: Diagnosis not present

## 2016-11-30 DIAGNOSIS — Z79899 Other long term (current) drug therapy: Secondary | ICD-10-CM

## 2016-11-30 DIAGNOSIS — J811 Chronic pulmonary edema: Secondary | ICD-10-CM | POA: Diagnosis present

## 2016-11-30 DIAGNOSIS — D689 Coagulation defect, unspecified: Secondary | ICD-10-CM | POA: Diagnosis present

## 2016-11-30 DIAGNOSIS — F329 Major depressive disorder, single episode, unspecified: Secondary | ICD-10-CM | POA: Diagnosis present

## 2016-11-30 DIAGNOSIS — I8510 Secondary esophageal varices without bleeding: Secondary | ICD-10-CM

## 2016-11-30 DIAGNOSIS — Z8249 Family history of ischemic heart disease and other diseases of the circulatory system: Secondary | ICD-10-CM

## 2016-11-30 DIAGNOSIS — B1920 Unspecified viral hepatitis C without hepatic coma: Secondary | ICD-10-CM | POA: Diagnosis present

## 2016-11-30 DIAGNOSIS — K766 Portal hypertension: Secondary | ICD-10-CM

## 2016-11-30 DIAGNOSIS — Z87891 Personal history of nicotine dependence: Secondary | ICD-10-CM

## 2016-11-30 DIAGNOSIS — E876 Hypokalemia: Secondary | ICD-10-CM | POA: Diagnosis present

## 2016-11-30 DIAGNOSIS — Z9119 Patient's noncompliance with other medical treatment and regimen: Secondary | ICD-10-CM | POA: Diagnosis not present

## 2016-11-30 DIAGNOSIS — F1010 Alcohol abuse, uncomplicated: Secondary | ICD-10-CM | POA: Diagnosis present

## 2016-11-30 DIAGNOSIS — K7031 Alcoholic cirrhosis of liver with ascites: Secondary | ICD-10-CM

## 2016-11-30 DIAGNOSIS — R278 Other lack of coordination: Secondary | ICD-10-CM | POA: Diagnosis present

## 2016-11-30 DIAGNOSIS — N179 Acute kidney failure, unspecified: Secondary | ICD-10-CM | POA: Diagnosis present

## 2016-11-30 DIAGNOSIS — Z7951 Long term (current) use of inhaled steroids: Secondary | ICD-10-CM

## 2016-11-30 DIAGNOSIS — F419 Anxiety disorder, unspecified: Secondary | ICD-10-CM | POA: Diagnosis present

## 2016-11-30 DIAGNOSIS — Z9889 Other specified postprocedural states: Secondary | ICD-10-CM

## 2016-11-30 DIAGNOSIS — E669 Obesity, unspecified: Secondary | ICD-10-CM | POA: Diagnosis not present

## 2016-11-30 DIAGNOSIS — K219 Gastro-esophageal reflux disease without esophagitis: Secondary | ICD-10-CM | POA: Diagnosis present

## 2016-11-30 DIAGNOSIS — I8500 Esophageal varices without bleeding: Secondary | ICD-10-CM | POA: Diagnosis present

## 2016-11-30 DIAGNOSIS — Z800 Family history of malignant neoplasm of digestive organs: Secondary | ICD-10-CM

## 2016-11-30 DIAGNOSIS — E039 Hypothyroidism, unspecified: Secondary | ICD-10-CM | POA: Diagnosis present

## 2016-11-30 DIAGNOSIS — J90 Pleural effusion, not elsewhere classified: Secondary | ICD-10-CM

## 2016-11-30 DIAGNOSIS — B182 Chronic viral hepatitis C: Secondary | ICD-10-CM

## 2016-11-30 DIAGNOSIS — R4182 Altered mental status, unspecified: Secondary | ICD-10-CM | POA: Diagnosis present

## 2016-11-30 DIAGNOSIS — Z8049 Family history of malignant neoplasm of other genital organs: Secondary | ICD-10-CM

## 2016-11-30 DIAGNOSIS — Z9981 Dependence on supplemental oxygen: Secondary | ICD-10-CM | POA: Diagnosis not present

## 2016-11-30 DIAGNOSIS — Z803 Family history of malignant neoplasm of breast: Secondary | ICD-10-CM

## 2016-11-30 DIAGNOSIS — Z9114 Patient's other noncompliance with medication regimen: Secondary | ICD-10-CM

## 2016-11-30 DIAGNOSIS — R32 Unspecified urinary incontinence: Secondary | ICD-10-CM | POA: Diagnosis present

## 2016-11-30 DIAGNOSIS — Z888 Allergy status to other drugs, medicaments and biological substances status: Secondary | ICD-10-CM | POA: Diagnosis not present

## 2016-11-30 LAB — ECHOCARDIOGRAM COMPLETE
Height: 64 in
Weight: 3169.6 oz

## 2016-11-30 LAB — COMPREHENSIVE METABOLIC PANEL
ALT: 23 U/L (ref 14–54)
ANION GAP: 9 (ref 5–15)
AST: 57 U/L — ABNORMAL HIGH (ref 15–41)
Albumin: 1.9 g/dL — ABNORMAL LOW (ref 3.5–5.0)
Alkaline Phosphatase: 101 U/L (ref 38–126)
BILIRUBIN TOTAL: 5 mg/dL — AB (ref 0.3–1.2)
BUN: 14 mg/dL (ref 6–20)
CO2: 24 mmol/L (ref 22–32)
Calcium: 8.6 mg/dL — ABNORMAL LOW (ref 8.9–10.3)
Chloride: 108 mmol/L (ref 101–111)
Creatinine, Ser: 0.8 mg/dL (ref 0.44–1.00)
Glucose, Bld: 160 mg/dL — ABNORMAL HIGH (ref 65–99)
POTASSIUM: 3.7 mmol/L (ref 3.5–5.1)
Sodium: 141 mmol/L (ref 135–145)
Total Protein: 5.7 g/dL — ABNORMAL LOW (ref 6.5–8.1)

## 2016-11-30 LAB — CBC
HEMATOCRIT: 35 % — AB (ref 36.0–46.0)
HEMOGLOBIN: 11.7 g/dL — AB (ref 12.0–15.0)
MCH: 32.8 pg (ref 26.0–34.0)
MCHC: 33.4 g/dL (ref 30.0–36.0)
MCV: 98 fL (ref 78.0–100.0)
Platelets: 96 10*3/uL — ABNORMAL LOW (ref 150–400)
RBC: 3.57 MIL/uL — AB (ref 3.87–5.11)
RDW: 21 % — AB (ref 11.5–15.5)
WBC: 5.7 10*3/uL (ref 4.0–10.5)

## 2016-11-30 LAB — INFLUENZA PANEL BY PCR (TYPE A & B)
INFLBPCR: NEGATIVE
Influenza A By PCR: NEGATIVE

## 2016-11-30 LAB — PHOSPHORUS: Phosphorus: 4 mg/dL (ref 2.5–4.6)

## 2016-11-30 LAB — PROTIME-INR
INR: 1.76
Prothrombin Time: 20.8 seconds — ABNORMAL HIGH (ref 11.4–15.2)

## 2016-11-30 LAB — GLUCOSE, CAPILLARY: Glucose-Capillary: 164 mg/dL — ABNORMAL HIGH (ref 65–99)

## 2016-11-30 LAB — ETHANOL

## 2016-11-30 LAB — VITAMIN B12: Vitamin B-12: 1128 pg/mL — ABNORMAL HIGH (ref 180–914)

## 2016-11-30 LAB — APTT: APTT: 39 s — AB (ref 24–36)

## 2016-11-30 LAB — MAGNESIUM: MAGNESIUM: 1.4 mg/dL — AB (ref 1.7–2.4)

## 2016-11-30 LAB — PROCALCITONIN: Procalcitonin: <0.1 ng/mL

## 2016-11-30 MED ORDER — FUROSEMIDE 80 MG PO TABS
80.0000 mg | ORAL_TABLET | Freq: Every day | ORAL | Status: DC
  Administered 2016-11-30 – 2016-12-01 (×2): 80 mg via ORAL
  Filled 2016-11-30 (×2): qty 1

## 2016-11-30 MED ORDER — RIFAXIMIN 550 MG PO TABS
550.0000 mg | ORAL_TABLET | Freq: Two times a day (BID) | ORAL | Status: DC
  Administered 2016-11-30 – 2016-12-01 (×3): 550 mg via ORAL
  Filled 2016-11-30 (×3): qty 1

## 2016-11-30 MED ORDER — ALBUTEROL SULFATE (2.5 MG/3ML) 0.083% IN NEBU: 2.5000 mg | INHALATION_SOLUTION | Freq: Four times a day (QID) | RESPIRATORY_TRACT | Status: DC | PRN

## 2016-11-30 MED ORDER — POTASSIUM CHLORIDE CRYS ER 20 MEQ PO TBCR
20.0000 meq | EXTENDED_RELEASE_TABLET | Freq: Every day | ORAL | Status: DC
  Administered 2016-11-30: 20 meq via ORAL
  Filled 2016-11-30: qty 1

## 2016-11-30 MED ORDER — ORAL CARE MOUTH RINSE
15.0000 mL | Freq: Two times a day (BID) | OROMUCOSAL | Status: DC
  Administered 2016-11-30 – 2016-12-01 (×3): 15 mL via OROMUCOSAL

## 2016-11-30 MED ORDER — ACETAMINOPHEN 325 MG PO TABS: 650.0000 mg | ORAL_TABLET | Freq: Four times a day (QID) | ORAL | Status: DC | PRN

## 2016-11-30 MED ORDER — PROPRANOLOL HCL 20 MG PO TABS
20.0000 mg | ORAL_TABLET | Freq: Every day | ORAL | Status: DC
  Administered 2016-11-30 – 2016-12-01 (×2): 20 mg via ORAL
  Filled 2016-11-30 (×2): qty 1

## 2016-11-30 MED ORDER — DOXEPIN HCL 25 MG PO CAPS
25.0000 mg | ORAL_CAPSULE | Freq: Every day | ORAL | Status: DC
  Administered 2016-11-30: 25 mg via ORAL
  Filled 2016-11-30 (×2): qty 1

## 2016-11-30 MED ORDER — ACETAMINOPHEN 650 MG RE SUPP: 650.0000 mg | Freq: Four times a day (QID) | RECTAL | Status: DC | PRN

## 2016-11-30 MED ORDER — PROMETHAZINE HCL 25 MG PO TABS
12.5000 mg | ORAL_TABLET | Freq: Four times a day (QID) | ORAL | Status: DC | PRN
  Administered 2016-11-30: 12.5 mg via ORAL
  Filled 2016-11-30: qty 1

## 2016-11-30 MED ORDER — PANTOPRAZOLE SODIUM 40 MG PO TBEC
40.0000 mg | DELAYED_RELEASE_TABLET | Freq: Every day | ORAL | Status: DC
  Administered 2016-11-30 – 2016-12-01 (×2): 40 mg via ORAL
  Filled 2016-11-30 (×2): qty 1

## 2016-11-30 MED ORDER — SPIRONOLACTONE 25 MG PO TABS
100.0000 mg | ORAL_TABLET | Freq: Once | ORAL | Status: AC
  Administered 2016-11-30: 100 mg via ORAL
  Filled 2016-11-30: qty 4

## 2016-11-30 MED ORDER — MOMETASONE FURO-FORMOTEROL FUM 200-5 MCG/ACT IN AERO
2.0000 | INHALATION_SPRAY | Freq: Two times a day (BID) | RESPIRATORY_TRACT | Status: DC
  Administered 2016-11-30 – 2016-12-01 (×3): 2 via RESPIRATORY_TRACT
  Filled 2016-11-30: qty 8.8

## 2016-11-30 MED ORDER — SPIRONOLACTONE 25 MG PO TABS
200.0000 mg | ORAL_TABLET | Freq: Every day | ORAL | Status: DC
  Administered 2016-12-01: 200 mg via ORAL
  Filled 2016-11-30: qty 8

## 2016-11-30 MED ORDER — MOMETASONE FURO-FORMOTEROL FUM 100-5 MCG/ACT IN AERO: 2.0000 | INHALATION_SPRAY | Freq: Two times a day (BID) | RESPIRATORY_TRACT | Status: DC

## 2016-11-30 MED ORDER — SPIRONOLACTONE 25 MG PO TABS
100.0000 mg | ORAL_TABLET | Freq: Every day | ORAL | Status: DC
  Administered 2016-11-30: 100 mg via ORAL
  Filled 2016-11-30: qty 4

## 2016-11-30 MED ORDER — HEPARIN SODIUM (PORCINE) 5000 UNIT/ML IJ SOLN
5000.0000 [IU] | Freq: Three times a day (TID) | INTRAMUSCULAR | Status: DC
  Administered 2016-11-30 – 2016-12-01 (×4): 5000 [IU] via SUBCUTANEOUS
  Filled 2016-11-30 (×5): qty 1

## 2016-11-30 MED ORDER — LEVOTHYROXINE SODIUM 50 MCG PO TABS
50.0000 ug | ORAL_TABLET | Freq: Every day | ORAL | Status: DC
  Administered 2016-11-30 – 2016-12-01 (×2): 50 ug via ORAL
  Filled 2016-11-30 (×2): qty 1

## 2016-11-30 MED ORDER — LACTULOSE 10 GM/15ML PO SOLN
30.0000 g | ORAL | Status: DC
  Administered 2016-11-30 – 2016-12-01 (×12): 30 g via ORAL
  Filled 2016-11-30 (×11): qty 45

## 2016-11-30 MED ORDER — IPRATROPIUM-ALBUTEROL 0.5-2.5 (3) MG/3ML IN SOLN
3.0000 mL | Freq: Once | RESPIRATORY_TRACT | Status: AC
  Administered 2016-11-30: 3 mL via RESPIRATORY_TRACT
  Filled 2016-11-30: qty 3

## 2016-11-30 MED ORDER — TIOTROPIUM BROMIDE MONOHYDRATE 18 MCG IN CAPS
18.0000 ug | ORAL_CAPSULE | Freq: Every day | RESPIRATORY_TRACT | Status: DC
  Administered 2016-11-30 – 2016-12-01 (×2): 18 ug via RESPIRATORY_TRACT
  Filled 2016-11-30: qty 5

## 2016-11-30 NOTE — H&P (Signed)
Date: 11/30/2016               Patient Name:  Tani Grays MRN: [id number]  DOB: 09-23-1961 Age / Sex: 56 y.o., female   PCP: Milagros Loll, MD         Medical Service: Internal Medicine Teaching Service         Attending Physician: Dr. Oval Linsey, MD    First Contact: Dr. Mollie Germany Pager: [phone]  Second Contact: Dr. Maryellen Pile Pager: [phone]       After Hours (After 5p/  First Contact Pager: [phone]  weekends / holidays): Second Contact Pager: [phone]   Chief Complaint: AMS  History of Present Illness: Ms. Illana Boyce is a 56 y.o. female with a h/o of hypertension, hepatitis C, alcohol cirrhosis, COPD, GERD, depression/anxiety, who presents with acute AMS.Patient was oriented only to her first name not to place or date. There were no family present at the time of interview for collateral information. The following history of present illness was collected from review of the medical record.  [facility]/2016 with complaint of worsening ascites. At that time she was noted to have some changes to her mental status being more somnolent and sleepy. She was also noted to have significant worsening distention of her abdomen with ascites. Review of the medical record demonstrates that at that time she was taking lactulose only once daily and was having only one to 2 bowel movements a day which were formed. Patient had not been following up with her hepatologist and aunt had missed her last appointment due to the snowy weather. During outpatient the patient received paracentesis during which 4 L of yellow ascitic fluid was withdrawn and sent for analysis. That analysis demonstrated no signs of SBP. Patient also received stat blood work which demonstrated mild AKI which was thought to be prerenal due to increase in her Lasix dose recently. Finally she noted that she had been unsteady on her feet and falling recently.  Patient has baseline coagulopathy from her liver failure and as a result underwent CT scan to rule out intracranial bleed which was negative.  Following day the patient was brought to the [facility] emergency department via EMS at the request of her family who reported that she was unarousable and somnolent in her bed at home. Patient was reportedly incontinent of stool and urine and had not been taking her home medications. Her family was concerned that this was the result of inadequate care provided by her husband who had been neglecting her. Family denies any fevers or vomiting. They deny any blood in her stool. The patient reportedly has COPD and is significantly wheezy at baseline. Laboratory evaluation in the emergency department revealed improved serum creatinine with transaminases consistent with studies performed in the clinic the day prior. Ammonia level was obtained which was elevated at 60.  Meds: Prescriptions Prior to Admission  Medication Sig Dispense Refill Last Dose  . albuterol (PROVENTIL HFA;VENTOLIN HFA) 108 (90 Base) MCG/ACT inhaler Inhale 2 puffs into the lungs every 6 (six) hours as needed for wheezing or shortness of breath.   unknown  . albuterol (PROVENTIL) (2.5 MG/3ML) 0.083% nebulizer solution Take 3 mLs (2.5 mg total) by nebulization every 6 (six) hours as needed for wheezing or shortness of breath. 75 mL 3 unknown  . budesonide-formoterol (SYMBICORT) 80-4.5 MCG/ACT inhaler Inhale 2 puffs into the lungs 2 (two) times daily.   11/28/2016  at Unknown time  . busPIRone (BUSPAR) 15 MG tablet Take 1-2 tablets (15-30 mg total) by mouth See admin instructions. 15 mg in the morning then 30 mg in the evening 90 tablet 0 11/28/2016 at Unknown time  . doxepin (SINEQUAN) 25 MG capsule Take 25 mg by mouth at bedtime.   11/28/2016 at Unknown time  . furosemide (LASIX) 80 MG tablet TAKE 1 TABLET (80 MG TOTAL) BY MOUTH DAILY. 14 tablet 0 11/28/2016 at Unknown time  . hydrOXYzine (ATARAX/VISTARIL)  25 MG tablet Take 1 tablet (25 mg total) by mouth 3 (three) times daily. 90 tablet 0 11/28/2016 at Unknown time  . lactulose (CHRONULAC) 10 GM/15ML solution Take 30 mLs (20 g total) by mouth 3 (three) times daily. 240 mL 2 11/28/2016 at Unknown time  . levothyroxine (SYNTHROID) 50 MCG tablet Take 1 tablet (50 mcg total) by mouth daily before breakfast. 30 tablet 1 11/28/2016 at Unknown time  . LORazepam (ATIVAN) 1 MG tablet Take 1 tablet (1 mg total) by mouth 2 (two) times daily. 60 tablet 0 11/28/2016 at Unknown time  . Melatonin 1 MG CAPS Take 1 mg by mouth at bedtime.   11/28/2016 at Unknown time  . meloxicam (MOBIC) 7.5 MG tablet Take 7.5 mg by mouth daily.   11/28/2016 at Unknown time  . mometasone-formoterol (DULERA) 200-5 MCG/ACT AERO Inhale 2 puffs into the lungs 2 (two) times daily. 13 g 2 11/28/2016 at Unknown time  . pantoprazole (PROTONIX) 40 MG tablet Take 1 tablet (40 mg total) by mouth daily. 90 tablet 1 11/28/2016 at Unknown time  . potassium chloride SA (K-DUR,KLOR-CON) 20 MEQ tablet Take 1 tablet (20 mEq total) by mouth daily. 90 tablet 0 11/28/2016 at Unknown time  . propranolol (INDERAL) 20 MG tablet Take 1 tablet (20 mg total) by mouth daily. 90 tablet 3 11/28/2016 at Unknown time  . rifaximin (XIFAXAN) 550 MG TABS tablet Take 1 tablet (550 mg total) by mouth 2 (two) times daily. 60 tablet 2 11/28/2016 at Unknown time  . spironolactone (ALDACTONE) 100 MG tablet Take 1 tablet (100 mg total) by mouth daily. 30 tablet 2 11/28/2016 at Unknown time  . tiotropium (SPIRIVA HANDIHALER) 18 MCG inhalation capsule Place 1 capsule (18 mcg total) into inhaler and inhale daily. 30 capsule 3 11/28/2016 at Unknown time  . traMADol (ULTRAM) 50 MG tablet Take 1 tablet (50 mg total) by mouth every 6 (six) hours as needed for severe pain. 120 tablet 0 unknown  . traZODone (DESYREL) 50 MG tablet Take 1 tablet (50 mg total) by mouth at bedtime. 30 tablet 0 11/28/2016 at Unknown time  . VOLTAREN 1 % GEL APPLY 4 G TOPICALLY 4  (FOUR) TIMES DAILY. 100 g 3 unknown   Allergies: Allergies as of 11/29/2016 - Review Complete 11/29/2016  Allergen Reaction Noted  . Ibuprofen Other (See Comments) 02/26/2016  . Tylenol [acetaminophen] Other (See Comments) 03/29/2015  . Amitriptyline Other (See Comments) 01/22/2013   Past Medical History:  Diagnosis Date  . Adverse drug effect - [place] 03/07/2015  . Alcoholic cirrhosis ([place])   . Allergy   . Anxiety   . Arthritis   . cervical Cancer 1999  . Cholelithiasis   . COPD (chronic obstructive pulmonary disease) ([place])   . Decompensated hepatic cirrhosis ([place]) 03/07/2015   Side effect of [place]  . Depression   . Esophageal varices ([place])   . Esophagitis 2010  . Fatty liver   . Gastritis   . GERD (gastroesophageal reflux disease)   .  Heart palpitations   . Hepatitis C    chronic hepatitis C and Steatohepatitis (hep grade 2, stage 2-3) per 05/13/08 liver biopsy  . Hypertension   . Obesity   . Portal hypertension ([place])   . Shortness of breath   . Splenomegaly   . Tubulovillous adenoma of colon   . Visual hallucination    since 09/2010/notes 10/30/2012    Family History: Family History  Problem Relation Age of Onset  . Cervical cancer Sister   . Breast cancer Maternal Aunt   . Heart disease Father 59    died from MI  . Breast cancer Father   . Colon cancer Paternal Aunt 29  . Cervical cancer Daughter    Social History: Social History  Substance Use Topics  . Smoking status: Former Smoker    Packs/day: 0.10    Years: 40.00    Types: Cigarettes    Quit date: 03/28/2012  . Smokeless tobacco: Never Used  . Alcohol use No   Review of Systems: A complete ROS was negative except as per HPI. Review of Systems  Unable to perform ROS: Mental status change    Physical Exam: Vitals:   11/30/16 0100 11/30/16 0101 11/30/16 0130 11/30/16 0211  BP: 117/69 117/69 114/75 118/71  Pulse: 67 70 72 80  Resp: 17 16 15 14   Temp:      TempSrc:      SpO2: 100% 100%  100% 100%  Weight:      Height:       Physical Exam  Constitutional: She appears lethargic. She appears ill. No distress.  Obese  HENT:  Head: Normocephalic and atraumatic.  Right Ear: Hearing normal.  Left Ear: Hearing normal.  Nose: Nose normal.  Mouth/Throat: Mucous membranes are normal.  Eyes: Pupils are equal, round, and reactive to light. Scleral icterus is present.  Cardiovascular: Normal rate, regular rhythm, S1 normal, S2 normal and intact distal pulses.  Exam reveals no gallop.   No murmur heard. Pulmonary/Chest: Effort normal and breath sounds normal. No respiratory distress. She has no wheezes. She has no rhonchi. She has no rales. She exhibits no tenderness. Breasts are symmetrical.  Poor exam limited by lack of pt cooperation  Abdominal: Soft. Normal appearance and bowel sounds are normal. She exhibits shifting dullness, distension and fluid wave. She exhibits no ascites. There is hepatomegaly. There is no tenderness. There is no rigidity, no rebound, no guarding and no CVA tenderness.  Bandage covering paracentesis site on the RUQ, no surrounding erythema or TTP.  Musculoskeletal: She exhibits edema (3+ BLE to the thigh). She exhibits no tenderness.  Neurological: She appears lethargic. She is disoriented (Knows first name only).  Skin: Skin is warm, dry and intact. She is not diaphoretic.  Psychiatric: Her affect is blunt. Her speech is slurred. She is slowed. She is inattentive.   Labs: CBC:  Recent Labs Lab 11/28/16 1055 11/29/16 2107  WBC 9.7 8.1  NEUTROABS 6.8  --   HGB 12.6 12.8  HCT 38.2 38.1  MCV 99.0 97.7  PLT 139* 123XX123*   Basic Metabolic Panel:  Recent Labs Lab 11/28/16 1050 11/29/16 2107  NA 140 141  K 3.5 3.8  CL 105 107  CO2 26 29  GLUCOSE 144* 97  BUN 15 17  CREATININE 1.40* 0.87  CALCIUM 8.3* 8.3*   Coagulation Studies:  Recent Labs  11/28/16 1055  LABPROT 19.4*  INR 1.62   Liver Function Tests:  Recent Labs Lab  11/28/16 1050 11/29/16 2107  AST 50* 58*  ALT 21 23  ALKPHOS 186* 134*  BILITOT 3.5* 5.3*  PROT 6.7 6.6  ALBUMIN 2.2* 2.3*    Recent Labs Lab 11/29/16 2107  LIPASE 26    Recent Labs Lab 11/29/16 2107  AMMONIA 60*   CBG: Lab Results  Component Value Date   HGBA1C 5.0 02/15/2016    Recent Labs Lab 11/29/16 2145  GLUCAP 81   Microbiology: Results for orders placed or performed in visit on 11/28/16  Body fluid culture     Status: None (Preliminary result)   Collection Time: 11/28/16  2:30 PM  Result Value Ref Range Status   Specimen Description FLUID PERITONEAL  Final   Special Requests NONE  Final   Gram Stain   Final    RARE WBC PRESENT,BOTH PMN AND MONONUCLEAR NO ORGANISMS SEEN    Culture NO GROWTH < 24 HOURS  Final   Report Status PENDING  Incomplete   Urinalysis    Component Value Date/Time   COLORURINE AMBER (A) 11/29/2016 2107   APPEARANCEUR CLEAR 11/29/2016 2107   LABSPEC 1.024 11/29/2016 2107   PHURINE 7.0 11/29/2016 2107   GLUCOSEU NEGATIVE 11/29/2016 2107   HGBUR NEGATIVE 11/29/2016 2107   BILIRUBINUR SMALL (A) 11/29/2016 2107   KETONESUR NEGATIVE 11/29/2016 2107   PROTEINUR NEGATIVE 11/29/2016 2107   UROBILINOGEN 1.0 03/05/2015 1830   NITRITE NEGATIVE 11/29/2016 2107   LEUKOCYTESUR NEGATIVE 11/29/2016 2107   Imaging: Ct Head Wo Contrast  Result Date: 11/29/2016 CLINICAL DATA:  Chronic ascites, altered mental status, status post paracentesis yesterday. History of cirrhosis. EXAM: CT HEAD WITHOUT CONTRAST TECHNIQUE: Contiguous axial images were obtained from the base of the skull through the vertex without intravenous contrast. COMPARISON:  CT HEAD November 28, 2016 FINDINGS: BRAIN: The ventricles and sulci are mildly prominent for age. No intraparenchymal hemorrhage, mass effect nor midline shift. No acute large vascular territory infarcts. Patchy periventricular white matter hypodensities are similar. No abnormal extra-axial fluid collections.  Basal cisterns are patent. VASCULAR: Trace calcific atherosclerosis carotid siphons. SKULL/SOFT TISSUES: No skull fracture. No significant soft tissue swelling. ORBITS/SINUSES: The included ocular globes and orbital contents are normal.The mastoid aircells and included paranasal sinuses are well-aerated. OTHER: None. IMPRESSION: No acute intracranial process. Stable examination: Mild parenchymal brain volume loss for age and mild chronic small vessel ischemic disease. Electronically Signed   By: Elon Alas M.D.   On: 11/29/2016 23:14   CT Head and Cervical Spine Wo Contrast  Result Date: 11/28/2016 CLINICAL DATA:  56 year old female status post multiple falls in the last few weeks with head injury. Posterior neck and right side head pain. Initial encounter. EXAM: CT HEAD WITHOUT CONTRAST CT CERVICAL SPINE WITHOUT CONTRAST TECHNIQUE: Multidetector CT imaging of the head and cervical spine was performed following the standard protocol without intravenous contrast. Multiplanar CT image reconstructions of the cervical spine were also generated. COMPARISON:  Head CT without contrast 08/29/2016. Cervical spine radiographs 07/13/2011. FINDINGS: CT HEAD FINDINGS Brain: Cerebral volume is stable and within normal limits. No midline shift, ventriculomegaly, mass effect, evidence of mass lesion, intracranial hemorrhage or evidence of cortically based acute infarction. Gray-white matter differentiation is within normal limits throughout the brain. Vascular: Calcified atherosclerosis at the skull base. Skull: Hyperostosis of the calvarium, normal variant. No skull fracture identified. Sinuses/Orbits: Visualized paranasal sinuses and mastoids are stable and well pneumatized. Other: No scalp hematoma identified. Visualized orbit soft tissues are within normal limits. CT CERVICAL SPINE FINDINGS Alignment: Increase straightening of cervical lordosis since 2012. Cervicothoracic  junction alignment is within normal limits.  Bilateral posterior element alignment is within normal limits. Skull base and vertebrae: Visualized skull base is intact. No atlanto-occipital dissociation. No cervical spine fracture. Soft tissues and spinal canal: No prevertebral fluid or swelling. No visible canal hematoma. Negative noncontrast neck soft tissues. Disc levels: Chronic disc and endplate degeneration from C3-C4 to C6-C7. Disc space loss and progressive in [place] irregularity since 2012. Up to mild associated degenerative cervical spinal stenosis such as at C4-C5. Upper chest: Visible upper thoracic levels appear intact. Calcified aortic atherosclerosis. No superior mediastinal lymphadenopathy. Mild dependent pulmonary atelectasis. Layering left pleural effusion up to 18 mm thick in the left lung apex. IMPRESSION: 1. No acute intracranial abnormality. Negative non contrast CT appearance of the brain, stable since 2017. 2. No acute fracture or listhesis identified in the cervical spine. Widespread chronic cervical disc and endplate degeneration with up to mild associated degenerative cervical spinal stenosis. 3. Layering left pleural effusion, suspect at least moderate in size. Recommend follow-up PA and lateral chest radiographs which can be compared to 10/04/2016. Electronically Signed   By: Genevie Ann M.D.   On: 11/28/2016 15:18   Dg Chest Port 1 View  Result Date: 11/29/2016 CLINICAL DATA:  Altered mental status. EXAM: PORTABLE CHEST 1 VIEW COMPARISON:  Radiograph 10/04/2016 FINDINGS: Vascular congestion. There is peribronchial cuffing. Left pleural effusion and adjacent basilar opacity, similar to prior exam. Unchanged heart size and mediastinal contours. Atherosclerosis of the aortic arch. No pneumothorax. No acute osseous abnormalities are seen. IMPRESSION: Vascular congestion. Peribronchial cuffing may be infectious or pulmonary edema. Left pleural effusion with basilar opacity, likely atelectasis, grossly unchanged in size from prior.  Electronically Signed   By: Jeb Levering M.D.   On: 11/29/2016 22:10   Assessment & Plan by Problem: Active Problems:   Altered mental status  Ms. Eddye Wiesenfeld is a 56 y.o. female with Hep C and EtOH cirrhosis w/ ESLD, hepatic encephalopathy, and COPD who presents with acute AMS.  1) Hepatic Encephalopathy: most likely hepatic 2/2 poor compliance w/ home lactulose. May be related to hypothyroid state w/ TSH significantly elevated, however, Free T4 wnl. Chemistries w/o significant evidence of other metabolic encephalopathy. No clinical signs of infections w/o fever, leukocytosis, or dirty UA. ? pulm process on CXR. LV paracentesis performed 11/28/16 was negative for SBP. Head imaging was negative. Would like to titrate lactulose to 4-5 BM daily. TBili mildly elevated. Abdomen distended w/ significant ascites. - admit to med-surg - lactulose PO, titrate to 4-5 loose BM daily - continue rifaxamin 550mg  PO BID - continue home lasix 80mg  qD and spiro 100mg  qD - consider repeat therapeutic paracentesis after MS clears - follow CBC  2) COPD / Pulm edema: Pt w/o respiratory distress, O2 sats good on RA. Baseline wheezing, poor compliance w/ home meds: Dulera BID, Spiriva qD, albuterol PRN. CTAB on exam, but difficult to assess lung bases d/t poor pt cooperation. CXR demonstrates peribronchial cuffing which may be 2/2 infection vs pulm edema. No h/o HF or echocardiogram. Chronic left pleural effusion noted, unchanged from prior, likely hepatic in origin. - continue home COPD meds - nebs PRN - repeat CXR in AM - pro-calcitonin - consider Abx pending repeat CXR and pro-cal - echocardiogram  3) ESLD: Hep C and EtOH cirrhosis. Coagulopathy w/ depression platelets and elevated PT. Encephalopathy. Esophageal varices and portal gastropathy noted on EGD in 2012, no h/o GIB. - continue home propranolol ppx - continue home PPI  4) HTN: BP well controlled  currently. Continue home lasix and spiro.  DVT  PPx - heparin  Code Status - Full  Dispo: Admit patient to Inpatient with expected length of stay greater than 2 midnights.  Signed: Holley Raring, MD 11/30/2016, 3:33 AM  Pager: [phone]

## 2016-11-30 NOTE — Progress Notes (Signed)
Pt had 4 loose stools and has vomited twice. MD notified and reported that lactulose can be held for the rest of the day since pt has reached goal of 4 stools.

## 2016-11-30 NOTE — Progress Notes (Signed)
   Subjective: Patient continues to be altered this morning. Oriented to self but not place or time. She has no complaints this morning. Afebrile overnight.   Objective:  Vital signs in last 24 hours: Vitals:   11/30/16 0211 11/30/16 0349 11/30/16 0509 11/30/16 0851  BP: 118/71  105/64   Pulse: 80  75   Resp: 14  20   Temp:   98.2 F (36.8 C)   TempSrc:      SpO2: 100%  93% 92%  Weight:  89.9 kg (198 lb 1.6 oz)    Height:  5\' 4"  (1.626 m)     Physical Exam Constitutional: Obese lady in NAD, appears comfortable Cardiovascular: RRR, no murmurs, rubs, or gallops.  Pulmonary/Chest: CTAB, no wheezes, rales, or rhonchi.  Abdominal: Distended but soft and non tender. Normal bowel sounds. No fluid wave.   Extremities: Warm and well perfused. Distal pulses intact. 2-3+ pitting edema to the knees bilaterally.  Neurological: Alert and oriented to self, but not place or time. CN II - XII grossly intact.  Psychiatric: Slow cognition, slow to respond   Assessment/Plan:  [name] [name] is a 56 y.o. 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  Altered Mental Status: Most likely 2/2 to hepatic encephalopathy and medication non compliance with her home lactulose. Patient has a history of hep C and alcoholic liver disease with prior hospitalization for lactulose non compliance. Other possible contributing factors include sedating medication (ativan, atarax, trazodone, melatonin) which were held on admission. Patient has a history of hypothyroidism and TSH was elevated at recent office visit however free T4 was normal. Unclear the significance of this subclinical hypothyroid state in the setting of acute illness. There are no signs of infection or SBP at this time (no fever, no leukocytosis) and recent paracentesis on 11/28/16 without evidence of infection (negative gram stain and culture negative for growth thus far).  -- CT head negative  --  Continue lactulose & rifaxamin  -- Increase home spironolactone to 200 mg daily -- Continue lasix 80 mg daily  -- Consider repeat therapeutic and/or diagnostic paracentesis if mental status does not improve -- Will touch base with husband on medication compliance and clarify which of the sedating medications on her home list she is actually taking  -- Follow fever curve, wbc   Hx COPD: Patient is asymptomatic and afebrile. S/p solumedrol in ED. Admission CXR concerning for pulm edema. No hx of HF.  -- Echo pending  -- Duonebs, albuterol nebs prn  -- Continue Dulera BID -- Continue Spiriva daily   End Stage Liver Disease: Hep C and alcoholic cirrhosis. Coagulopathy w/ depression platelets and elevated PT. Encephalopathy. Esophageal varices and portal gastropathy noted on EGD in 2012, no h/o GIB. -- continue home propranolol ppx -- continue home PPI  FEN: No fluids, replete lytes prn, heart healthy diet  VTE ppx: Sub q heparin  Code Status: FULL  Dispo: Anticipated discharge in approximately 1-2 days pending improvement in AMS and further work up.   Velna Ochs, MD 11/30/2016, 10:46 AM Pager: [phone]

## 2016-11-30 NOTE — Progress Notes (Signed)
Physical Therapy Evaluation Patient Details Name: [name] [name] MRN: [id number] DOB: [birth year] Today's Date: 11/30/2016   History of Present Illness  Patient presents with AMS. Per patients bhusband she is ussualy more alert and oriented. She can answer basic questions. PMH: HTN, HEP C espohageal varicies, COPD, anxiety,arthritis   Clinical Impression  At baseline the patients husband helped the patient with all transfers and all daily living tasks. At this time he was able to assist her out of bed and to take a few steps. She is a fall risk. Her mental status is effecting her ability to follow commands. As that improves hopefully her mobility will as well. Therapy will continue to follow her acutely. She may benefit from Home health on discharge but if her mental status improves she may be near baseline.     Follow Up Recommendations No PT follow up    Equipment Recommendations       Recommendations for Other Services       Precautions / Restrictions Precautions Precautions: Fall Restrictions Weight Bearing Restrictions: No      Mobility  Bed Mobility Overal bed mobility: Needs Assistance Bed Mobility: Supine to Sit;Sit to Supine     Supine to sit: Min assist Sit to supine: Mod assist   General bed mobility comments: husband assisted her to the edge of the bed and therapy and husband assisted her back to the bed.   Transfers Overall transfer level: Needs assistance Equipment used: 1 person hand held assist Transfers: Sit to/from Stand Sit to Stand: Min assist         General transfer comment: Husband held her hands and she was able to stand with some assistance. He reports this is baseline at home   Ambulation/Gait Ambulation/Gait assistance: Min assist Ambulation Distance (Feet): 3 Feet Assistive device: 1 person hand held assist     Gait velocity interpretation: Below normal speed for age/gender General Gait Details: After 3' the patient stopped walking. Her  husband reported that when she does this at times she will sit down wereever she is. Therapy and husband able to get her back to bed with cuing.   Stairs            Wheelchair Mobility    Modified Rankin (Stroke Patients Only) Modified Rankin (Stroke Patients Only) Pre-Morbid Rankin Score: Moderately severe disability Modified Rankin: Moderately severe disability     Balance Overall balance assessment: Needs assistance Sitting-balance support: Bilateral upper extremity supported Sitting balance-Leahy Scale: Poor     Standing balance support: Bilateral upper extremity supported Standing balance-Leahy Scale: Poor                               Pertinent Vitals/Pain Pain Assessment: Faces Faces Pain Scale: No hurt    Home Living Family/patient expects to be discharged to:: Private residence Living Arrangements: Spouse/significant other Available Help at Discharge: Family;Available PRN/intermittently Type of Home: Apartment Home Access: Level entry     Home Layout: One level Home Equipment: Walker - 4 wheels;Cane - single point;Shower seat Additional Comments: Per husband, he performs all IADLs. He states that pt stays in bed most of the day and that he assists her with short walks in the house. He reported that she is unable to use the [facility] or RW secondary to being too weak.    Prior Function Level of Independence: Needs assistance   Gait / Transfers Assistance Needed: pt's husband stated that he ambulates  short distances with pt in the home, with her holding onto him with both UEs.  ADL's / Homemaking Assistance Needed: Pt is dependent on husband for IADLs. Pt's husband states that she bathes herself and he assists her with dressing.        Hand Dominance   Dominant Hand: Right    Extremity/Trunk Assessment   Upper Extremity Assessment Upper Extremity Assessment: Difficult to assess due to impaired cognition    Lower Extremity Assessment Lower  Extremity Assessment: Difficult to assess due to impaired cognition       Communication   Communication: Expressive difficulties;Receptive difficulties  Cognition Arousal/Alertness: Lethargic Behavior During Therapy: Flat affect Overall Cognitive Status: Impaired/Different from baseline                 General Comments: pr husband she is ussualy more responsive     General Comments      Exercises     Assessment/Plan    PT Assessment Patient needs continued PT services  PT Problem List Decreased strength;Decreased activity tolerance;Decreased balance;Decreased mobility;Decreased coordination;Decreased cognition;Decreased knowledge of use of DME;Decreased safety awareness;Pain          PT Treatment Interventions Gait training;Functional mobility training;Therapeutic activities;Therapeutic exercise;Balance training;Neuromuscular re-education    PT Goals (Current goals can be found in the Care Plan section)  Acute Rehab PT Goals Patient Stated Goal: to go home  PT Goal Formulation: With patient/family Time For Goal Achievement: 12/07/16 Potential to Achieve Goals: Fair    Frequency Min 3X/week   Barriers to discharge        Co-evaluation               End of Session Equipment Utilized During Treatment: Gait belt Activity Tolerance: Patient limited by lethargy (husband declined to get the patient into the chair. ) Patient left: in bed;with bed alarm set;with family/visitor present Nurse Communication: Mobility status    Functional Assessment Tool Used: clinical decision making  Functional Limitation: Mobility: Walking and moving around Mobility: Walking and Moving Around Current Status [id number]): At least 60 percent but less than 80 percent impaired, limited or restricted Mobility: Walking and Moving Around Goal Status [phone]): At least 60 percent but less than 80 percent impaired, limited or restricted    Time: 1330-1350 PT Time Calculation (min) (ACUTE  ONLY): 20 min   Charges:   PT Evaluation $PT Eval Moderate Complexity: 1 Procedure     PT G Codes:   PT G-Codes **NOT FOR INPATIENT CLASS** Functional Assessment Tool Used: clinical decision making  Functional Limitation: Mobility: Walking and moving around Mobility: Walking and Moving Around Current Status [id number]): At least 60 percent but less than 80 percent impaired, limited or restricted Mobility: Walking and Moving Around Goal Status [phone]): At least 60 percent but less than 80 percent impaired, limited or restricted    Carney Living PT DPT  11/30/2016, 2:57 PM

## 2016-12-01 DIAGNOSIS — Z886 Allergy status to analgesic agent status: Secondary | ICD-10-CM

## 2016-12-01 DIAGNOSIS — Z888 Allergy status to other drugs, medicaments and biological substances status: Secondary | ICD-10-CM

## 2016-12-01 DIAGNOSIS — K7290 Hepatic failure, unspecified without coma: Principal | ICD-10-CM

## 2016-12-01 LAB — COMPREHENSIVE METABOLIC PANEL
ALK PHOS: 77 U/L (ref 38–126)
ALT: 19 U/L (ref 14–54)
AST: 44 U/L — ABNORMAL HIGH (ref 15–41)
Albumin: 1.7 g/dL — ABNORMAL LOW (ref 3.5–5.0)
Anion gap: 6 (ref 5–15)
BUN: 17 mg/dL (ref 6–20)
CALCIUM: 8.6 mg/dL — AB (ref 8.9–10.3)
CHLORIDE: 113 mmol/L — AB (ref 101–111)
CO2: 26 mmol/L (ref 22–32)
CREATININE: 0.72 mg/dL (ref 0.44–1.00)
GFR calc Af Amer: >60 mL/min (ref >60)
GFR calc non Af Amer: >60 mL/min (ref >60)
GLUCOSE: 142 mg/dL — AB (ref 65–99)
Potassium: 3 mmol/L — ABNORMAL LOW (ref 3.5–5.1)
SODIUM: 145 mmol/L (ref 135–145)
Total Bilirubin: 3.6 mg/dL — ABNORMAL HIGH (ref 0.3–1.2)
Total Protein: 5.2 g/dL — ABNORMAL LOW (ref 6.5–8.1)

## 2016-12-01 LAB — CBC
HCT: 31.9 % — ABNORMAL LOW (ref 36.0–46.0)
Hemoglobin: 10.6 g/dL — ABNORMAL LOW (ref 12.0–15.0)
MCH: 32.6 pg (ref 26.0–34.0)
MCHC: 33.2 g/dL (ref 30.0–36.0)
MCV: 98.2 fL (ref 78.0–100.0)
PLATELETS: 104 10*3/uL — AB (ref 150–400)
RBC: 3.25 MIL/uL — AB (ref 3.87–5.11)
RDW: 21.3 % — ABNORMAL HIGH (ref 11.5–15.5)
WBC: 6.9 10*3/uL (ref 4.0–10.5)

## 2016-12-01 LAB — GLUCOSE, CAPILLARY: Glucose-Capillary: 117 mg/dL — ABNORMAL HIGH (ref 65–99)

## 2016-12-01 MED ORDER — POTASSIUM CHLORIDE CRYS ER 20 MEQ PO TBCR: 40.0000 meq | EXTENDED_RELEASE_TABLET | Freq: Every day | ORAL | 0 refills | Status: DC

## 2016-12-01 MED ORDER — SPIRONOLACTONE 100 MG PO TABS: 200.0000 mg | ORAL_TABLET | Freq: Every day | ORAL | 0 refills | Status: DC

## 2016-12-01 MED ORDER — POTASSIUM CHLORIDE CRYS ER 20 MEQ PO TBCR
40.0000 meq | EXTENDED_RELEASE_TABLET | Freq: Two times a day (BID) | ORAL | Status: DC
  Administered 2016-12-01: 40 meq via ORAL
  Filled 2016-12-01: qty 2

## 2016-12-01 NOTE — Discharge Instructions (Signed)
[name] [name],  It is very important that you take the Lactulose at home. You need to have 3-4 bowel movements EVERY DAY to ensure the toxins do not build up in your system and make you confused. If you do not take the lactulose, you will become confused and end up back in the hospital.   Please take all of your medications as prescribed and follow up in clinic in 1 week.   Hepatic Encephalopathy Introduction Hepatic encephalopathy is a loss of brain function from advanced liver disease. The effects of the condition depend on the type of liver damage and how severe it is. In some cases, hepatic encephalopathy can be reversed. What are the causes? The exact cause of hepatic encephalopathy is not known. What increases the risk? You have a higher risk of getting this condition if your liver is damaged. When the liver is damaged harmful substances called toxins can build up in the body. Certain toxins, such as ammonia, can harm your brain. Conditions that can cause liver damage include:  An infection.  Dehydration.  Intestinal bleeding.  Drinking too much alcohol.  Taking certain medicines, including tranquilizers, water pills (diuretics), antidepressants, or sleeping pills. What are the signs or symptoms? Signs and symptoms may develop suddenly. Or, they may develop slowly and get worse gradually. Symptoms can range from mild to severe. Mild Hepatic Encephalopathy  Mild confusion.  Personality and mood changes.  Anxiety and agitation.  Drowsiness.  Loss of mental abilities.  Musty or sweet-smelling breath. Worsening or Severe Hepatic Encephalopathy  Slowed movement.  Slurred speech.  Extreme personality changes.  Disorientation.  Abnormal shaking or flapping of the hands.  Coma. How is this diagnosed? To make a diagnosis, your health care provider will do a physical exam. To rule out other causes of your signs and symptoms, he or she may order tests. You may  have:  Blood tests. These may be done to check your ammonia level, measure how long it takes your blood to clot, and check for infection.  Liver function tests. These may be done to check how well your liver is working.  MRI and CT scans. These may be done to check for a brain disorder.  Electroencephalogram (EEG). This may be done to measure the electrical activity in your brain. How is this treated? The first step in treatment is identifying and treating possible triggers. The next step is involves taking medicine to lower the level of toxins in the body and to prevent ammonia from building up. You may need to take:  Antibiotics to reduce the ammonia-producing bacteria in your gut.  Lactulose to help flush ammonia from the gut. Follow these instructions at home: Eating and drinking  Follow a low-protein diet that includes plenty of fruits, vegetables, and whole grains, as directed by your health care provider. Ammonia is produced when you digest high-protein foods.  Work with a Microbiologist or with your health care provider to make sure you are getting the right balance of protein and minerals.  Drink enough fluids to keep your urine clear or pale yellow. Drinking plenty of water helps prevent constipation.  Do not drink alcohol or use illegal drugs. Medicines  Only take medicine as directed by your health care provider.  If you were prescribed an antibiotic medicine, finish it all even if you start to feel better.  Do not start any new medicines, including over-the-counter medicines, without first checking with your health care provider. Contact a health care provider if:  You have new symptoms.  Your symptoms change.  Your symptoms get worse.  You have a fever.  You are constipated.  You have persistent nausea, vomiting, or diarrhea. Get help right away if:  You become very confused or drowsy.  You vomit blood or material that looks like coffee grounds.  Your stool  is bloody or black or looks like tar. This information is not intended to replace advice given to you by your health care provider. Make sure you discuss any questions you have with your health care provider. Document Released: 12/24/2006 Document Revised: 03/21/2016 Document Reviewed: 06/01/2014  2017 Elsevier  Lactulose oral solution What is this medicine? LACTULOSE (LAK tyoo lose) is a laxative derived from lactose. It helps to treat chronic constipation and to treat or prevent hepatic encephalopathy or coma. These are brain disorders that result from liver disease. This medicine may be used for other purposes; ask your health care provider or pharmacist if you have questions. COMMON BRAND NAME(S): Acilac, Cephulac, Cholac, Chronulac, Constilac, Constulose, Enulose, Generlac What should I tell my health care provider before I take this medicine? They need to know if you have any of these conditions: -need a galactose-free diet -scheduled for surgery -an unusual or allergic reaction to lactulose, other sugars, medicines, foods, dyes or preservatives -pregnant or trying to get pregnant -breast-feeding How should I use this medicine? Take this medicine mouth. Follow the directions on the prescription label. Shake well before using. Use a specially marked spoon or container to measure your medicine. Ask your pharmacist if you do not have one. Household spoons are not accurate. Take your doses at regular intervals. Do not take your medicine more often than directed. You may be directed to take this medicine rectally. If so, you must follow specific directions from your doctor or healthcare professional. Please contact him or her. Talk to your pediatrician regarding the use of this medicine in children. Special care may be needed. Overdosage: If you think you have taken too much of this medicine contact a poison control center or emergency room at once. NOTE: This medicine is only for you. Do not  share this medicine with others. What if I miss a dose? If you miss a dose, take it as soon as you can. If it is almost time for your next dose, take only that dose. Do not take double or extra doses. What may interact with this medicine? -antacids -neomycin -other laxatives This list may not describe all possible interactions. Give your health care provider a list of all the medicines, herbs, non-prescription drugs, or dietary supplements you use. Also tell them if you smoke, drink alcohol, or use illegal drugs. Some items may interact with your medicine. What should I watch for while using this medicine? This medicine may not produce any result for 24 to 48 hours. Do not take this medicine for longer than directed by your doctor or health care professional. Drink plenty of water with each dose of this medicine. What side effects may I notice from receiving this medicine? Side effects that you should report to your doctor or health care professional as soon as possible: -diarrhea Side effects that usually do not require medical attention (report to your doctor or health care professional if they continue or are bothersome): -belching, flatulence -nausea or vomiting -stomach pain or discomfort This list may not describe all possible side effects. Call your doctor for medical advice about side effects. You may report side effects to FDA at 1-800-FDA-1088.  Where should I keep my medicine? Keep out of the reach of children. This medicine may darken in color under normal storage conditions. This is because of the sugar solution and does not affect the way the medicine works. If the solution becomes extremely dark in color, contact your health care professional before use. Store at room temperature between 15 and 30 degrees C (59 and 86 degrees F). Do not freeze. Keep container tightly closed. Throw away any unused medicine after the expiration date. NOTE: This sheet is a summary. It may not cover  all possible information. If you have questions about this medicine, talk to your doctor, pharmacist, or health care provider.  2017 Elsevier/Gold Standard (2008-04-19 16:04:57)

## 2016-12-01 NOTE — Care Management Note (Addendum)
Case Management Note  Patient Details  Name: [name] [name] MRN: [id number] Date of Birth: April 17, 1961  Subjective/Objective: 56 y.o. 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   Pt lives with spouse [name] [phone] who has been very helpful in assisting with planning HH needs. They have chosen [facility] to provide [facility] for disease management. CM explained Medicaid will not allow HHOT as stand alone service and PT is not allowed as she does not have diagnosis criteria for HHPT. Pt and spouse will call Medicaid on Monday and ask for available assist. No DME required as she would be unsafe with RW and already uses wheeled chair in home. 13:49: Made RN aware of need for change in [facility] order per Medicaid guidelines.                  Action/Plan: Anticipate discharge home today. No further CM needs but will be available should additional discharge needs arise.   Expected Discharge Date:  12/01/16               Expected Discharge Plan:  [place]  In-House Referral:  NA  Discharge planning Services  CM Consult  Post Acute Care Choice:  Home Health Choice offered to:  Patient, Spouse [name] [phone])  DME Arranged:  N/A DME Agency:  NA  HH Arranged:  RN, OT [place] Agency:  [place]  Status of Service:  In process, will continue to follow  If discussed at Long Length of Stay Meetings, dates discussed:    Additional Comments:  Delrae Sawyers, RN 12/01/2016, 1:43 PM

## 2016-12-01 NOTE — Progress Notes (Signed)
Pt discharged home via taxi.

## 2016-12-01 NOTE — Progress Notes (Addendum)
   Subjective:  [name] [name] is much improved today. She is alert and oriented to person place and time. She is able to hold a conversation. Had several bowel movements yesterday. No complaints today. Would like to go home.  Objective:  Vital signs in last 24 hours: Vitals:   11/30/16 2053 12/01/16 0234 12/01/16 0504 12/01/16 0742  BP: (!) 103/59  (!) 92/58   Pulse: 70  (!) 59   Resp: 20  19   Temp: 98.4 F (36.9 C)  98.1 F (36.7 C)   TempSrc: Oral     SpO2: 95%  91% 93%  Weight:  198 lb 1.5 oz (89.9 kg)    Height:       Physical Exam Physical Exam  Constitutional: She is oriented to person, place, and time and well-developed, well-nourished, and in no distress.  Obese female in no acute distress   Cardiovascular: Normal rate, regular rhythm and normal heart sounds.   Pulmonary/Chest: Effort normal and breath sounds normal.  Abdominal: Soft. Bowel sounds are normal. She exhibits distension. There is no tenderness. There is no rebound and no guarding.  No fluid wave  Musculoskeletal: She exhibits edema.  Neurological: She is alert and oriented to person, place, and time.  No asterixis present  Skin: Skin is warm and dry.   Assessment/Plan:  [name] is a 56 y.o. 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  Altered Mental Status due to Hepatic Encephalopathy from medication non-compliance:  Nursing notes indicate 4 bowel moveemtns yesterday. She is much more alert and awake today. Holding a conversation. Patient has a history of hep C and alcoholic liver disease with prior hospitalization for lactulose non compliance. There are no signs of infection or SBP at this time (no fever, no leukocytosis) and recent paracentesis on 11/28/16 without evidence of infection (negative gram stain and culture negative for growth thus far).  -- Continue lactulose & rifaxamin  -- Continue home spironolactone to 200 mg daily --  Continue lasix 80 mg daily   Hypokalemia:  K 3.0 today, likely 2/2 diarrhea. KDUR 40 mEq bid today.  Hx COPD: Patient is asymptomatic and afebrile. S/p solumedrol in ED. Admission CXR concerning for pulm edema. No hx of HF.  -- Echo with EF 55-60%.  -- Duonebs, albuterol nebs prn  -- Continue Dulera BID -- Continue Spiriva daily   End Stage Liver Disease: Hep C and alcoholic cirrhosis. Coagulopathy w/ depression platelets and elevated PT. Encephalopathy. Esophageal varices and portal gastropathy noted on EGD in 2012, no h/o GIB. -- continue home propranolol ppx -- continue home PPI  FEN: No fluids, replete lytes prn, heart healthy diet  VTE ppx: Sub q heparin  Code Status: FULL  Dispo: Discharge home today with close follow up outpatient  Maryellen Pile, MD 12/01/2016, 9:54 AM Pager: [phone]

## 2016-12-01 NOTE — Progress Notes (Signed)
NURSING PROGRESS NOTE  [name] [name] [id number] Discharge Data: 12/01/2016 7:32 PM Attending Provider: Oval Linsey, MD [id number], Anderson Malta, MD     [name] to be D/C'd Home per MD order.  Discussed with the patient the After Visit Summary and all questions fully answered. All IV's discontinued with no bleeding noted. All belongings returned to patient for patient to take home.   Last Vital Signs:  Blood pressure (!) 92/58, pulse (!) 59, temperature 98.1 F (36.7 C), resp. rate 19, height 5\' 4"  (1.626 m), weight 89.9 kg (198 lb 1.5 oz), SpO2 93 %.  Discharge Medication List Allergies as of 12/01/2016      Reactions   Ibuprofen Other (See Comments)   ulcers   Tylenol [acetaminophen] Other (See Comments)   2/2 liver damage   Amitriptyline Other (See Comments)   hallucinations      Medication List    STOP taking these medications   traZODone 50 MG tablet Commonly known as:  DESYREL     TAKE these medications   albuterol 108 (90 Base) MCG/ACT inhaler Commonly known as:  PROVENTIL HFA;VENTOLIN HFA Inhale 2 puffs into the lungs every 6 (six) hours as needed for wheezing or shortness of breath.   albuterol (2.5 MG/3ML) 0.083% nebulizer solution Commonly known as:  PROVENTIL Take 3 mLs (2.5 mg total) by nebulization every 6 (six) hours as needed for wheezing or shortness of breath.   budesonide-formoterol 80-4.5 MCG/ACT inhaler Commonly known as:  SYMBICORT Inhale 2 puffs into the lungs 2 (two) times daily.   busPIRone 15 MG tablet Commonly known as:  BUSPAR Take 1-2 tablets (15-30 mg total) by mouth See admin instructions. 15 mg in the morning then 30 mg in the evening   doxepin 25 MG capsule Commonly known as:  SINEQUAN Take 25 mg by mouth at bedtime.   furosemide 80 MG tablet Commonly known as:  LASIX TAKE 1 TABLET (80 MG TOTAL) BY MOUTH DAILY.   hydrOXYzine 25 MG tablet Commonly known as:  ATARAX/VISTARIL Take 1 tablet (25 mg total) by mouth 3 (three) times  daily.   lactulose 10 GM/15ML solution Commonly known as:  CHRONULAC Take 30 mLs (20 g total) by mouth 3 (three) times daily.   levothyroxine 50 MCG tablet Commonly known as:  SYNTHROID Take 1 tablet (50 mcg total) by mouth daily before breakfast.   LORazepam 1 MG tablet Commonly known as:  ATIVAN Take 1 tablet (1 mg total) by mouth 2 (two) times daily.   Melatonin 1 MG Caps Take 1 mg by mouth at bedtime.   meloxicam 7.5 MG tablet Commonly known as:  MOBIC Take 7.5 mg by mouth daily.   mometasone-formoterol 200-5 MCG/ACT Aero Commonly known as:  DULERA Inhale 2 puffs into the lungs 2 (two) times daily.   pantoprazole 40 MG tablet Commonly known as:  PROTONIX Take 1 tablet (40 mg total) by mouth daily.   potassium chloride SA 20 MEQ tablet Commonly known as:  K-DUR,KLOR-CON Take 2 tablets (40 mEq total) by mouth daily. What changed:  how much to take   propranolol 20 MG tablet Commonly known as:  INDERAL Take 1 tablet (20 mg total) by mouth daily.   rifaximin 550 MG Tabs tablet Commonly known as:  XIFAXAN Take 1 tablet (550 mg total) by mouth 2 (two) times daily.   spironolactone 100 MG tablet Commonly known as:  ALDACTONE Take 2 tablets (200 mg total) by mouth daily. Start taking on:  12/02/2016 What changed:  how much  to take   tiotropium 18 MCG inhalation capsule Commonly known as:  SPIRIVA HANDIHALER Place 1 capsule (18 mcg total) into inhaler and inhale daily.   traMADol 50 MG tablet Commonly known as:  ULTRAM Take 1 tablet (50 mg total) by mouth every 6 (six) hours as needed for severe pain.   VOLTAREN 1 % Gel Generic drug:  diclofenac sodium APPLY 4 G TOPICALLY 4 (FOUR) TIMES DAILY.

## 2016-12-01 NOTE — Progress Notes (Signed)
Paged that patient's mother called and did not wish the patient to be discharge home today. She felt that she did not have a safe home environment and the husband could not take care of her. Appears to be some strife among the patient's family and her husband. Reassessed patient and her mental status is improved and she reports feeling safe to go home and would like to go home today. Will proceed with discharge home today.

## 2016-12-01 NOTE — Evaluation (Signed)
Occupational Therapy Evaluation Patient Details Name: [name] [name] MRN: [id number] DOB: August 17, 1961 Today's Date: 12/01/2016    History of Present Illness Patient presents with AMS. Per patients bhusband she is ussualy more alert and oriented. She can answer basic questions. PMH: HTN, HEP C espohageal varicies, COPD, anxiety,arthritis    Clinical Impression   Per husband, pt required assist with ADL PTA. Currently pt able to perform stand pivot to chair with mod assist, requires min assist for seated UB ADL, and max assist for LB ADL. Pt planning to d/c home with 24/7 assist from her husband. Recommending [place], [place] aide, and LCSW for follow up to maximize independence and safety with ADL and functional mobility upon return home. Pt would benefit from continued skilled OT to address established goals.  Per RN, family reporting concerns with husband caring for pt adequately at home. Husband does admit some difficulty with assisting pt with bathing at times and would appreciate an aide to come out to the house for additional assist. Feel LCSW consult at home would be appropriate to follow up due to family concerns with hygiene and caregiving abilities of pts husband.    Follow Up Recommendations  Home health OT;Supervision/Assistance - 24 hour;Other (comment) ([place] aide, LCSW)    Equipment Recommendations  None recommended by OT    Recommendations for Other Services       Precautions / Restrictions Precautions Precautions: Fall Restrictions Weight Bearing Restrictions: No      Mobility Bed Mobility Overal bed mobility: Needs Assistance Bed Mobility: Supine to Sit     Supine to sit: Min assist;HOB elevated     General bed mobility comments: Min assist for balance with trunk elevation to sitting. Increased time with HOB elevated and use of bed rail  Transfers Overall transfer level: Needs assistance Equipment used: 1 person hand held assist Transfers: Sit to/from [employer] Sit to Stand: Min assist Stand pivot transfers: Mod assist       General transfer comment: Cues for sequencing and safety. Pt with posterior lean during transfer    Balance Overall balance assessment: Needs assistance;History of Falls Sitting-balance support: Feet supported;No upper extremity supported Sitting balance-Leahy Scale: Fair     Standing balance support: Bilateral upper extremity supported Standing balance-Leahy Scale: Poor                              ADL Overall ADL's : Needs assistance/impaired Eating/Feeding: Set up;Sitting   Grooming: Set up;Supervision/safety;Sitting   Upper Body Bathing: Minimal assistance;Sitting   Lower Body Bathing: Maximal assistance;Sit to/from stand   Upper Body Dressing : Minimal assistance;Sitting Upper Body Dressing Details (indicate cue type and reason): to doff/don hospital gown Lower Body Dressing: Maximal assistance;Sit to/from stand   Toilet Transfer: Moderate assistance;Stand-pivot;BSC (1 person hand held assist) Toilet Transfer Details (indicate cue type and reason): Simulated by stand pivot to chair         Functional mobility during ADLs: Moderate assistance;Cueing for safety;Cueing for sequencing (hand held assist)       Vision     Perception     Praxis      Pertinent Vitals/Pain Pain Assessment: No/denies pain     Hand Dominance Right   Extremity/Trunk Assessment Upper Extremity Assessment Upper Extremity Assessment: Generalized weakness   Lower Extremity Assessment Lower Extremity Assessment: Defer to PT evaluation   Cervical / Trunk Assessment Cervical / Trunk Assessment: Normal   Communication Communication Communication: No  difficulties   Cognition Arousal/Alertness: Awake/alert Behavior During Therapy: Flat affect Overall Cognitive Status: History of cognitive impairments - at baseline                 General Comments: Per husband, pt is at cognitive  baseline.   General Comments       Exercises       Shoulder Instructions      Home Living Family/patient expects to be discharged to:: Private residence Living Arrangements: Spouse/significant other Available Help at Discharge: Family;Available 24 hours/day Type of Home: Apartment Home Access: Level entry     Home Layout: One level     Bathroom Shower/Tub: Tub/shower unit Shower/tub characteristics: Curtain Biochemist, clinical: Standard     Home Equipment: Environmental consultant - 4 wheels;Cane - single point;Shower seat          Prior Functioning/Environment Level of Independence: Needs assistance  Gait / Transfers Assistance Needed: per husband, pt has good days and bad days. On her good days she is able to ambulate without assist. On her bad days she needs physical assist for mobility (holding onto her husbands bil UE). ADL's / Homemaking Assistance Needed: Husband reports he assists with all ADL. Admits difficulty with getting pt bathed properly.            OT Problem List: Decreased strength;Decreased activity tolerance;Impaired balance (sitting and/or standing);Decreased cognition;Decreased safety awareness;Decreased knowledge of use of DME or AE;Decreased knowledge of precautions;Obesity   OT Treatment/Interventions: Self-care/ADL training;Therapeutic exercise;Energy conservation;DME and/or AE instruction;Therapeutic activities;Patient/family education;Balance training;Cognitive remediation/compensation    OT Goals(Current goals can be found in the care plan section) Acute Rehab OT Goals Patient Stated Goal: to go home  OT Goal Formulation: With patient/family Time For Goal Achievement: 12/15/16 Potential to Achieve Goals: Good ADL Goals Pt Will Perform Grooming: with supervision;sitting Pt Will Perform Upper Body Bathing: with supervision;sitting Pt Will Perform Lower Body Bathing: with min assist;sit to/from stand Pt Will Transfer to Toilet: with min  assist;ambulating;bedside commode Pt Will Perform Toileting - Clothing Manipulation and hygiene: with min assist;sit to/from stand  OT Frequency: Min 2X/week   Barriers to D/C:            Co-evaluation              End of Session Equipment Utilized During Treatment: Oxygen Nurse Communication: Mobility status;Other (comment) (discussed f/u recommendations)  Activity Tolerance: Patient tolerated treatment well Patient left: in chair;with call bell/phone within reach;with chair alarm set;with family/visitor present   Time: [id number] OT Time Calculation (min): 21 min Charges:  OT General Charges $OT Visit: 1 Procedure OT Evaluation $OT Eval Moderate Complexity: 1 Procedure G-Codes:     Binnie Kand M.S., OTR/L Pager: [phone]  12/01/2016, 1:15 PM

## 2016-12-01 NOTE — Progress Notes (Signed)
Pt's family stated they would not come get pt for discharge. Message left with SW to further assist.

## 2016-12-01 NOTE — Progress Notes (Signed)
Followed up with the MD if Lactulose should continue to be held. MD says to continue to hold. Will continue to monitor.

## 2016-12-01 NOTE — Progress Notes (Signed)
Contacted MD to clarify lactulose order. MD stated to give one dose of lactulose and hold others.

## 2016-12-02 LAB — BODY FLUID CULTURE: Culture: NO GROWTH

## 2016-12-03 NOTE — Progress Notes (Signed)
Internal Medicine Clinic Attending  Case discussed with Dr. Randell Patient soon after the resident saw the patient.  We reviewed the resident's history and exam and pertinent patient test results.  I agree with the assessment, diagnosis, and plan of care documented in the resident's note.  I staffed the patient with Dr. Randell Patient, but did not perform procedure.  This was done with Dr. Azucena Freed supervision.

## 2016-12-03 NOTE — Discharge Summary (Signed)
Name: [name] [name] MRN: [id number] DOB: 1960/11/12 56 y.o. PCP: Milagros Loll, MD  Date of Admission: 11/29/2016  8:40 PM Date of Discharge: 12/01/2016 Attending Physician: Oval Linsey, MD  Discharge Diagnosis: 1. Hepatic Encephalopathy  Discharge Medications: Allergies as of 12/01/2016      Reactions   Ibuprofen Other (See Comments)   ulcers   Tylenol [acetaminophen] Other (See Comments)   2/2 liver damage   Amitriptyline Other (See Comments)   hallucinations      Medication List    STOP taking these medications   traZODone 50 MG tablet Commonly known as:  DESYREL     TAKE these medications   albuterol 108 (90 Base) MCG/ACT inhaler Commonly known as:  PROVENTIL HFA;VENTOLIN HFA Inhale 2 puffs into the lungs every 6 (six) hours as needed for wheezing or shortness of breath.   albuterol (2.5 MG/3ML) 0.083% nebulizer solution Commonly known as:  PROVENTIL Take 3 mLs (2.5 mg total) by nebulization every 6 (six) hours as needed for wheezing or shortness of breath.   budesonide-formoterol 80-4.5 MCG/ACT inhaler Commonly known as:  SYMBICORT Inhale 2 puffs into the lungs 2 (two) times daily.   busPIRone 15 MG tablet Commonly known as:  BUSPAR Take 1-2 tablets (15-30 mg total) by mouth See admin instructions. 15 mg in the morning then 30 mg in the evening   doxepin 25 MG capsule Commonly known as:  SINEQUAN Take 25 mg by mouth at bedtime.   furosemide 80 MG tablet Commonly known as:  LASIX TAKE 1 TABLET (80 MG TOTAL) BY MOUTH DAILY.   hydrOXYzine 25 MG tablet Commonly known as:  ATARAX/VISTARIL Take 1 tablet (25 mg total) by mouth 3 (three) times daily.   lactulose 10 GM/15ML solution Commonly known as:  CHRONULAC Take 30 mLs (20 g total) by mouth 3 (three) times daily.   levothyroxine 50 MCG tablet Commonly known as:  SYNTHROID Take 1 tablet (50 mcg total) by mouth daily before breakfast.   LORazepam 1 MG tablet Commonly known as:  ATIVAN Take 1  tablet (1 mg total) by mouth 2 (two) times daily.   Melatonin 1 MG Caps Take 1 mg by mouth at bedtime.   meloxicam 7.5 MG tablet Commonly known as:  MOBIC Take 7.5 mg by mouth daily.   mometasone-formoterol 200-5 MCG/ACT Aero Commonly known as:  DULERA Inhale 2 puffs into the lungs 2 (two) times daily.   pantoprazole 40 MG tablet Commonly known as:  PROTONIX Take 1 tablet (40 mg total) by mouth daily.   potassium chloride SA 20 MEQ tablet Commonly known as:  K-DUR,KLOR-CON Take 2 tablets (40 mEq total) by mouth daily. What changed:  how much to take   propranolol 20 MG tablet Commonly known as:  INDERAL Take 1 tablet (20 mg total) by mouth daily.   rifaximin 550 MG Tabs tablet Commonly known as:  XIFAXAN Take 1 tablet (550 mg total) by mouth 2 (two) times daily.   spironolactone 100 MG tablet Commonly known as:  ALDACTONE Take 2 tablets (200 mg total) by mouth daily. What changed:  how much to take   tiotropium 18 MCG inhalation capsule Commonly known as:  SPIRIVA HANDIHALER Place 1 capsule (18 mcg total) into inhaler and inhale daily.   traMADol 50 MG tablet Commonly known as:  ULTRAM Take 1 tablet (50 mg total) by mouth every 6 (six) hours as needed for severe pain.   VOLTAREN 1 % Gel Generic drug:  diclofenac sodium APPLY 4 G  TOPICALLY 4 (FOUR) TIMES DAILY.       Disposition and follow-up:   [name] [name] was discharged from [facility] in Stable condition.  At the hospital follow up visit please address:  1.  Hepatic Encephalopathy: Mental status resolved with resumption of Lactulose. Discussed compliance with medications at length with patient and husband. Please assess patient's compliance   2.  Labs / imaging needed at time of follow-up: none  3.  Pending labs/ test needing follow-up: none  Follow-up Appointments: Follow-up Information    [facility] Follow up.   Why:  HHRN will be provided by above agency and  a representative will be in touch to arrange initial visit. They should contact you within 24-48 hours after discharge.  Contact information: [address] [place] [address] [place] Hospital Course by problem list:  1. Hepatic Encephalopathy: [name] [name] presented with increasing somnolence and confusion for the past day. She had been seen in clinic 2 days prior and paracentesis done at that time showed no evidence of SBP. She had a recent fall and CT head was obtained din the ED without evidence of subdural hematoma or intracranial hemorrhage. She was restarted on lactulose and achieved 4 bowel movements. Subsequently, had return to normal mental status and was AAOx3 and conversing well with resolution of her asterixis. Had long discussion with patient and husband on the importance of her medications, specifically her lactulose. Husband reported that he was allowing her to take care of her own medications and she did not always take them. They indicated they will plan on scheduling the medications every day and try to achieve 3-4 bowel movements daily. She was discharged with follow up in [facility] in the next week.   Discharge Vitals:   BP (!) 92/58 (BP Location: Right Arm)   Pulse (!) 59   Temp 98.1 F (36.7 C)   Resp 19   Ht 5\' 4"  (1.626 m)   Wt 198 lb 1.5 oz (89.9 kg)   SpO2 93%   BMI 34.00 kg/m    Discharge Instructions: Discharge Instructions    Increase activity slowly    Complete by:  As directed       Signed: Maryellen Pile, MD 12/03/2016, 8:47 AM   Pager: [phone]

## 2016-12-04 ENCOUNTER — Telehealth: Payer: Self-pay | Admitting: *Deleted

## 2016-12-04 NOTE — Telephone Encounter (Signed)
Pt calls and states she just got out of [facility] and never wants to go back to that hosp, she states it was horrible, she seems to thinks that [facility] at [facility] sent [facility].  She states she needs depends very badly and also supplies for wound care She states HH cancelled yesterday but they are to call her today She is given an appt w/ dr Randell Patient for tomorrow for HFU and to see what assistance she may need  Sending to dr Randell Patient, margaret jennings rn and chilon boone

## 2016-12-05 ENCOUNTER — Ambulatory Visit (INDEPENDENT_AMBULATORY_CARE_PROVIDER_SITE_OTHER): Payer: Medicaid Other | Admitting: Pulmonary Disease

## 2016-12-05 ENCOUNTER — Encounter: Payer: Self-pay | Admitting: Pulmonary Disease

## 2016-12-05 VITALS — BP 117/72 | HR 74 | Temp 98.3°F | Wt 191.0 lb

## 2016-12-05 DIAGNOSIS — K7290 Hepatic failure, unspecified without coma: Secondary | ICD-10-CM

## 2016-12-05 DIAGNOSIS — Z9889 Other specified postprocedural states: Secondary | ICD-10-CM

## 2016-12-05 DIAGNOSIS — K7682 Hepatic encephalopathy: Secondary | ICD-10-CM

## 2016-12-05 DIAGNOSIS — Z79891 Long term (current) use of opiate analgesic: Secondary | ICD-10-CM | POA: Diagnosis not present

## 2016-12-05 DIAGNOSIS — Z5189 Encounter for other specified aftercare: Secondary | ICD-10-CM | POA: Diagnosis present

## 2016-12-05 DIAGNOSIS — F1120 Opioid dependence, uncomplicated: Secondary | ICD-10-CM

## 2016-12-05 DIAGNOSIS — E876 Hypokalemia: Secondary | ICD-10-CM | POA: Diagnosis not present

## 2016-12-05 DIAGNOSIS — G8929 Other chronic pain: Secondary | ICD-10-CM

## 2016-12-05 LAB — CULTURE, BLOOD (ROUTINE X 2)
Culture: NO GROWTH
Culture: NO GROWTH

## 2016-12-05 MED ORDER — MELOXICAM 7.5 MG PO TABS: 7.5000 mg | ORAL_TABLET | Freq: Two times a day (BID) | ORAL | 1 refills | Status: DC | PRN
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
## 2016-12-05 NOTE — Telephone Encounter (Signed)
Pt calling requesting to speak with helen about yesterday conversation
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
## 2016-12-05 NOTE — Patient Instructions (Addendum)
Please follow up in 3 weeks.

## 2016-12-05 NOTE — Progress Notes (Signed)
Medicine Attending: I examined this patient on the day of her visit and personally supervised and assisted with a 4 liter paracentesis. No complications.

## 2016-12-06 ENCOUNTER — Telehealth: Payer: Self-pay

## 2016-12-06 LAB — BMP8+ANION GAP
ANION GAP: 19 mmol/L — AB (ref 10.0–18.0)
BUN/Creatinine Ratio: 15 (ref 9–23)
BUN: 11 mg/dL (ref 6–24)
CALCIUM: 8.5 mg/dL — AB (ref 8.7–10.2)
CHLORIDE: 95 mmol/L — AB (ref 96–106)
CO2: 21 mmol/L (ref 18–29)
Creatinine, Ser: 0.71 mg/dL (ref 0.57–1.00)
GFR calc Af Amer: 109 mL/min/{1.73_m2} (ref >59)
GFR calc non Af Amer: 95 mL/min/{1.73_m2} (ref >59)
GLUCOSE: 147 mg/dL — AB (ref 65–99)
POTASSIUM: 2.8 mmol/L — AB (ref 3.5–5.2)
Sodium: 135 mmol/L (ref 134–144)

## 2016-12-06 MED ORDER — FUROSEMIDE 80 MG PO TABS: 80.0000 mg | ORAL_TABLET | Freq: Every day | ORAL | 2 refills | Status: DC

## 2016-12-06 MED ORDER — POTASSIUM CHLORIDE ER 20 MEQ PO TBCR: 20.0000 meq | EXTENDED_RELEASE_TABLET | Freq: Two times a day (BID) | ORAL | 1 refills | Status: DC

## 2016-12-06 MED ORDER — POTASSIUM CHLORIDE ER 20 MEQ PO TBCR: 20.0000 meq | EXTENDED_RELEASE_TABLET | Freq: Two times a day (BID) | ORAL | 0 refills | Status: DC

## 2016-12-06 MED ORDER — INCONTINENCE BRIEF LARGE MISC: 0 refills | Status: DC

## 2016-12-06 MED ORDER — POTASSIUM CHLORIDE ER 20 MEQ PO TBCR: 20.0000 meq | EXTENDED_RELEASE_TABLET | Freq: Two times a day (BID) | ORAL | 1 refills | Status: AC

## 2016-12-06 MED ORDER — POTASSIUM CHLORIDE ER 20 MEQ PO TBCR: 20.0000 meq | EXTENDED_RELEASE_TABLET | Freq: Three times a day (TID) | ORAL | 1 refills | Status: DC

## 2016-12-06 NOTE — Assessment & Plan Note (Signed)
Assessment: Seems to have improved.  Plan:  Continue lactulose and rifaximin Rx for incontinence briefs Home health to start coming to her house on Wednesday

## 2016-12-06 NOTE — Telephone Encounter (Signed)
Clarified script for K+, 62meq take 1 tab twice daily #60 w/ 1 refill, script sent states #30, please correct medlist, thanks

## 2016-12-06 NOTE — Telephone Encounter (Signed)
Thanks

## 2016-12-06 NOTE — Addendum Note (Signed)
Addended by: Jacques Earthly T on: 12/06/2016 12:51 PM   Modules accepted: Orders

## 2016-12-06 NOTE — Telephone Encounter (Signed)
[name] [name] from [place] requesting to speak with a nurse regarding Potassium Chloride ER 20 MEQ TBCR. Please call back.

## 2016-12-06 NOTE — Assessment & Plan Note (Addendum)
Potassium decreased to 2.8. She was only taking one tablet of 75meq per day. Will increase her to Potassium Chloride 10meq BID. Discussed with patient on phone.

## 2016-12-06 NOTE — Assessment & Plan Note (Signed)
[name] [name] is on chronic opioid therapy for chronic pain. The date of the controlled substances contract is referenced in the [place] and / or the overview. Date of pain contract was 2013. As part of the treatment plan, the [place] controlled substance database is checked at least twice yearly and the database results are appropriate (I had to call and discuss with her pharmacy - I think she changed her last name). I have last reviewed the results on 12/05/2016.   The last UDS was on 11/23/2015 and results are as expected. Patient needs at least a yearly UDS.   The patient is on tramadol (Ultram) strength 50mg , 120 per 30 days. Adjunctive treatment includes NSAID's. This regimen allows [name] [name] to function and does not cause excessive sedation or other side effects.  "The benefits of continuing opioid therapy outweigh the risks and chronic opioids will be continued. Ongoing education about safe opioid treatment is provided  Interventions today include: No early refill on Tramadol. May request next refill March 1 UDS after she restarts tramadol Needs updated contract

## 2016-12-12 ENCOUNTER — Other Ambulatory Visit: Payer: Self-pay | Admitting: *Deleted

## 2016-12-12 ENCOUNTER — Telehealth: Payer: Self-pay

## 2016-12-12 MED ORDER — ALBUTEROL SULFATE HFA 108 (90 BASE) MCG/ACT IN AERS: 2.0000 | INHALATION_SPRAY | Freq: Four times a day (QID) | RESPIRATORY_TRACT | 1 refills | Status: DC | PRN

## 2016-12-12 NOTE — Telephone Encounter (Signed)
That's fine for the diag. Could also use hypokalemia.  Does she have Dulera at home? She should just be on Dulera or Symbicort, not both.  Thanks!

## 2016-12-12 NOTE — Telephone Encounter (Signed)
[facility] calls and ask: Pt does not have 2 of her inhalers, have sent refill request for those Labs to be drawn today, stat, needs diag code, cannot find in chart, request from dr Randell Patient for lab but gave the hepatic encephalopathy code to her, is this ok? [place] [phone]

## 2016-12-12 NOTE — Telephone Encounter (Signed)
[facility]oke with [facility] to see if patient is receiving services, they reported they are going out today between 11-12 pm verbal order given to draw stat BMET they report they will draw lab today

## 2016-12-13 ENCOUNTER — Other Ambulatory Visit: Payer: Self-pay

## 2016-12-13 MED ORDER — LACTULOSE 10 GM/15ML PO SOLN: 20.0000 g | Freq: Three times a day (TID) | ORAL | 2 refills | Status: DC

## 2016-12-19 NOTE — Progress Notes (Signed)
Internal Medicine Clinic Attending  Case discussed with Dr. Krall soon after the resident saw the patient.  We reviewed the resident's history and exam and pertinent patient test results.  I agree with the assessment, diagnosis, and plan of care documented in the resident's note. 

## 2016-12-23 ENCOUNTER — Telehealth: Payer: Self-pay | Admitting: Internal Medicine
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
## 2016-12-24 ENCOUNTER — Other Ambulatory Visit: Payer: Self-pay | Admitting: Pulmonary Disease

## 2016-12-24 MED ORDER — TRAMADOL HCL 50 MG PO TABS: 50.0000 mg | ORAL_TABLET | Freq: Four times a day (QID) | ORAL | 0 refills | Status: DC | PRN

## 2016-12-24 NOTE — Telephone Encounter (Signed)
Pt asking for tramadol refill today. Doesn't want to wait til tomorrow asking for nurse to call.

## 2016-12-24 NOTE — Telephone Encounter (Signed)
Called to pharm 

## 2016-12-25 ENCOUNTER — Ambulatory Visit (INDEPENDENT_AMBULATORY_CARE_PROVIDER_SITE_OTHER): Payer: Medicaid Other | Admitting: Pulmonary Disease

## 2016-12-25 ENCOUNTER — Telehealth: Payer: Self-pay | Admitting: *Deleted

## 2016-12-25 ENCOUNTER — Encounter: Payer: Self-pay | Admitting: Pulmonary Disease

## 2016-12-25 VITALS — BP 99/68 | HR 77 | Temp 97.4°F | Ht 64.0 in | Wt 183.9 lb

## 2016-12-25 DIAGNOSIS — K7682 Hepatic encephalopathy: Secondary | ICD-10-CM

## 2016-12-25 DIAGNOSIS — N179 Acute kidney failure, unspecified: Secondary | ICD-10-CM | POA: Diagnosis not present

## 2016-12-25 DIAGNOSIS — G8929 Other chronic pain: Secondary | ICD-10-CM

## 2016-12-25 DIAGNOSIS — Z79891 Long term (current) use of opiate analgesic: Secondary | ICD-10-CM

## 2016-12-25 DIAGNOSIS — K7031 Alcoholic cirrhosis of liver with ascites: Secondary | ICD-10-CM

## 2016-12-25 DIAGNOSIS — M549 Dorsalgia, unspecified: Secondary | ICD-10-CM

## 2016-12-25 DIAGNOSIS — K7290 Hepatic failure, unspecified without coma: Secondary | ICD-10-CM | POA: Diagnosis not present

## 2016-12-25 DIAGNOSIS — F1120 Opioid dependence, uncomplicated: Secondary | ICD-10-CM

## 2016-12-25 DIAGNOSIS — F1021 Alcohol dependence, in remission: Secondary | ICD-10-CM

## 2016-12-25 DIAGNOSIS — E876 Hypokalemia: Secondary | ICD-10-CM

## 2016-12-25 MED ORDER — LACTULOSE 10 GM/15ML PO SOLN: 20.0000 g | Freq: Three times a day (TID) | ORAL | 2 refills | Status: DC

## 2016-12-25 MED ORDER — TRAMADOL HCL 50 MG PO TABS: 50.0000 mg | ORAL_TABLET | Freq: Two times a day (BID) | ORAL | 0 refills | Status: AC | PRN

## 2016-12-25 NOTE — Assessment & Plan Note (Signed)
Abdomen much softer today. Will continue Lasix 80mg  daily and spironolactone 200mg  daily.

## 2016-12-25 NOTE — Assessment & Plan Note (Signed)
Mental status greatly improved. Having 3 BM per day per her report. Refilled lactulose.

## 2016-12-25 NOTE — Assessment & Plan Note (Signed)
Assessment: [name] [name] has gone the majority of the month without her tramadol.   Plan: I believe that we can safely decrease her dose to 50mg  q12hr prn #60. She has agreed to trying this.

## 2016-12-25 NOTE — Patient Instructions (Addendum)
Please keep taking your medications as prescribed Follow up in 4 weeks

## 2016-12-25 NOTE — Telephone Encounter (Signed)
Thanks for the update

## 2016-12-25 NOTE — Progress Notes (Signed)
   CC: back pain  HPI:  [name] is a 56 y.o. woman with history of hypertension, hepatitis C, alcohol cirrhosis, COPD, GERD, depression/anxiety presenting with back pain.  She would like to have her tramadol restarted for her chronic back pain. Having 3 bowel movements a day and taking her lactulose three times a day. She feels like her mental status is more clear.    Past Medical History:  Diagnosis Date  . Adverse drug effect - [facility] 03/07/2015  . Alcoholic cirrhosis ([place])   . Allergy   . Anxiety   . Arthritis   . cervical Cancer 1999  . Cholelithiasis   . COPD (chronic obstructive pulmonary disease) ([place])   . Decompensated hepatic cirrhosis ([place]) 03/07/2015   Side effect of Vikera  . Depression   . Esophageal varices ([place])   . Esophagitis 2010  . Fatty liver   . Gastritis   . GERD (gastroesophageal reflux disease)   . Heart palpitations   . Hepatitis C    chronic hepatitis C and Steatohepatitis (hep grade 2, stage 2-3) per 05/13/08 liver biopsy  . Hypertension   . Obesity   . Portal hypertension ([place])   . Shortness of breath   . Splenomegaly   . Tubulovillous adenoma of colon   . Visual hallucination    since 09/2010/notes 10/30/2012    Review of Systems:   No fevers but has chills No chest pain No dyspnea  Physical Exam:  Vitals:   12/25/16 1543  BP: 99/68  Pulse: 77  Temp: 97.4 F (36.3 C)  TempSrc: Oral  SpO2: 96%  Weight: 183 lb 14.4 oz (83.4 kg)  Height: 5\' 4"  (1.626 m)   General Apperance: NAD HEENT: Normocephalic, atraumatic, anicteric sclera Neck: Supple, trachea midline Lungs: Clear to auscultation bilaterally. No wheezes, rhonchi or rales. Breathing comfortably Heart: Regular rate and rhythm, no murmur/rub/gallop Abdomen: Soft, nontender, nondistended, no rebound/guarding Extremities: Warm and well perfused, 1-2+ BLE edema Skin: No rashes or lesions Neurologic: Alert and interactive. No gross deficits.  Assessment & Plan:    See Encounters Tab for problem based charting.  Patient discussed with Dr. Eppie Gibson
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
## 2016-12-25 NOTE — Assessment & Plan Note (Addendum)
Recheck BMP today. Resolved.

## 2016-12-26 LAB — BMP8+ANION GAP
Anion Gap: 19 mmol/L — ABNORMAL HIGH (ref 10.0–18.0)
BUN / CREAT RATIO: 20 (ref 9–23)
BUN: 27 mg/dL — ABNORMAL HIGH (ref 6–24)
CO2: 19 mmol/L (ref 18–29)
CREATININE: 1.37 mg/dL — AB (ref 0.57–1.00)
Calcium: 8.9 mg/dL (ref 8.7–10.2)
Chloride: 94 mmol/L — ABNORMAL LOW (ref 96–106)
GFR calc Af Amer: 49 mL/min/{1.73_m2} — ABNORMAL LOW (ref >59)
GFR calc non Af Amer: 43 mL/min/{1.73_m2} — ABNORMAL LOW (ref >59)
GLUCOSE: 150 mg/dL — AB (ref 65–99)
POTASSIUM: 4.7 mmol/L (ref 3.5–5.2)
SODIUM: 132 mmol/L — AB (ref 134–144)

## 2016-12-26 NOTE — Progress Notes (Signed)
Case discussed with Dr. Krall soon after the resident saw the patient.  We reviewed the resident's history and exam and pertinent patient test results.  I agree with the assessment, diagnosis, and plan of care documented in the resident's note. 

## 2016-12-26 NOTE — Assessment & Plan Note (Signed)
Cr fluctuates between 0.7 and 1.4. Will monitor closely. Recheck BMP at follow up

## 2016-12-27 ENCOUNTER — Telehealth: Payer: Self-pay | Admitting: Pulmonary Disease

## 2016-12-27 NOTE — Telephone Encounter (Signed)
Pt states she has not been eating and has not had a BM since wed, she is not eating well only a bite here and there explained she needs to eat or there is nothing to come out, states she doesn't have any help really now so its hard, will send to margaret and pcp

## 2016-12-27 NOTE — Telephone Encounter (Signed)
Would like a call back. Pt has not had a bowel movement since Wednesday.

## 2016-12-30 NOTE — Telephone Encounter (Signed)
Please schedule [name] [name] into the [facility] at the next available non-overbook slot she is able to make to follow-up.  Thank You.

## 2016-12-30 NOTE — Telephone Encounter (Signed)
Pt scheduled for 12/31/16 at 2:45

## 2016-12-31 ENCOUNTER — Other Ambulatory Visit (HOSPITAL_COMMUNITY): Payer: Self-pay | Admitting: Psychiatry

## 2016-12-31 ENCOUNTER — Ambulatory Visit: Payer: Self-pay

## 2017-01-02 ENCOUNTER — Ambulatory Visit: Payer: Self-pay

## 2017-01-02 ENCOUNTER — Other Ambulatory Visit: Payer: Self-pay | Admitting: Pulmonary Disease

## 2017-01-02 NOTE — Telephone Encounter (Signed)
Pt requesting a refill on lactulose (CHRONULAC) 10

## 2017-01-06 NOTE — Telephone Encounter (Signed)
Per dr Dareen Piano, pt needs f/u appt asap, cancelled 3/6 appt

## 2017-01-07 ENCOUNTER — Telehealth: Payer: Self-pay | Admitting: Pulmonary Disease

## 2017-01-07 NOTE — Telephone Encounter (Signed)
Please call nurse discharging due to no response from patient

## 2017-01-07 NOTE — Telephone Encounter (Signed)
Agree. [name] [name]!

## 2017-01-07 NOTE — Telephone Encounter (Signed)
Pt is on her own, w/o any help at home, [facility] called today and stated she had not been able to see pt for last 3 visits because no one answers the door and pt is not rtc, she was asking to discharge pt, I called pt and spoke to her she stated she wants to continue [facility] care and spoken to [facility] several times today, HHN donita states no one at [facility] had spoken w/ pt today, she had attempted and got no response. I ask HHN to attempt 1 more time to see pt after speaking w/ pt and she stating she wanted HH to continue If HHN cannot see pt due to pt's lack of scheduling could we possibly check into APS for pt? I do think she has diminished capabilities of self care.

## 2017-01-08 ENCOUNTER — Encounter (HOSPITAL_COMMUNITY): Payer: Self-pay

## 2017-01-08 ENCOUNTER — Emergency Department (HOSPITAL_COMMUNITY): Payer: Medicaid Other

## 2017-01-08 ENCOUNTER — Inpatient Hospital Stay (HOSPITAL_COMMUNITY)
Admission: EM | Admit: 2017-01-08 | Discharge: 2017-01-14 | DRG: 432 | Disposition: A | Discharge status: Home / Self Care | Payer: Medicaid Other | Attending: Infectious Disease | Admitting: Infectious Disease

## 2017-01-08 ENCOUNTER — Telehealth: Payer: Self-pay

## 2017-01-08 ENCOUNTER — Telehealth: Payer: Self-pay | Admitting: Pulmonary Disease

## 2017-01-08 DIAGNOSIS — I8510 Secondary esophageal varices without bleeding: Secondary | ICD-10-CM

## 2017-01-08 DIAGNOSIS — Z79899 Other long term (current) drug therapy: Secondary | ICD-10-CM

## 2017-01-08 DIAGNOSIS — Z7951 Long term (current) use of inhaled steroids: Secondary | ICD-10-CM | POA: Diagnosis not present

## 2017-01-08 DIAGNOSIS — D696 Thrombocytopenia, unspecified: Secondary | ICD-10-CM | POA: Diagnosis present

## 2017-01-08 DIAGNOSIS — F17210 Nicotine dependence, cigarettes, uncomplicated: Secondary | ICD-10-CM | POA: Diagnosis present

## 2017-01-08 DIAGNOSIS — D72829 Elevated white blood cell count, unspecified: Secondary | ICD-10-CM | POA: Diagnosis not present

## 2017-01-08 DIAGNOSIS — F419 Anxiety disorder, unspecified: Secondary | ICD-10-CM | POA: Diagnosis present

## 2017-01-08 DIAGNOSIS — Z9889 Other specified postprocedural states: Secondary | ICD-10-CM | POA: Diagnosis not present

## 2017-01-08 DIAGNOSIS — Z886 Allergy status to analgesic agent status: Secondary | ICD-10-CM | POA: Diagnosis not present

## 2017-01-08 DIAGNOSIS — Z8049 Family history of malignant neoplasm of other genital organs: Secondary | ICD-10-CM

## 2017-01-08 DIAGNOSIS — E875 Hyperkalemia: Secondary | ICD-10-CM

## 2017-01-08 DIAGNOSIS — E039 Hypothyroidism, unspecified: Secondary | ICD-10-CM | POA: Diagnosis present

## 2017-01-08 DIAGNOSIS — K7460 Unspecified cirrhosis of liver: Secondary | ICD-10-CM | POA: Diagnosis present

## 2017-01-08 DIAGNOSIS — Z9189 Other specified personal risk factors, not elsewhere classified: Secondary | ICD-10-CM

## 2017-01-08 DIAGNOSIS — C539 Malignant neoplasm of cervix uteri, unspecified: Secondary | ICD-10-CM

## 2017-01-08 DIAGNOSIS — Z888 Allergy status to other drugs, medicaments and biological substances status: Secondary | ICD-10-CM

## 2017-01-08 DIAGNOSIS — K8020 Calculus of gallbladder without cholecystitis without obstruction: Secondary | ICD-10-CM | POA: Diagnosis not present

## 2017-01-08 DIAGNOSIS — K7290 Hepatic failure, unspecified without coma: Secondary | ICD-10-CM | POA: Diagnosis present

## 2017-01-08 DIAGNOSIS — I8511 Secondary esophageal varices with bleeding: Secondary | ICD-10-CM | POA: Diagnosis present

## 2017-01-08 DIAGNOSIS — I10 Essential (primary) hypertension: Secondary | ICD-10-CM | POA: Diagnosis present

## 2017-01-08 DIAGNOSIS — Z8541 Personal history of malignant neoplasm of cervix uteri: Secondary | ICD-10-CM

## 2017-01-08 DIAGNOSIS — E871 Hypo-osmolality and hyponatremia: Secondary | ICD-10-CM | POA: Diagnosis present

## 2017-01-08 DIAGNOSIS — Z8619 Personal history of other infectious and parasitic diseases: Secondary | ICD-10-CM | POA: Diagnosis not present

## 2017-01-08 DIAGNOSIS — K920 Hematemesis: Secondary | ICD-10-CM | POA: Diagnosis present

## 2017-01-08 DIAGNOSIS — K7682 Hepatic encephalopathy: Secondary | ICD-10-CM | POA: Diagnosis present

## 2017-01-08 DIAGNOSIS — F1021 Alcohol dependence, in remission: Secondary | ICD-10-CM

## 2017-01-08 DIAGNOSIS — J449 Chronic obstructive pulmonary disease, unspecified: Secondary | ICD-10-CM | POA: Diagnosis present

## 2017-01-08 DIAGNOSIS — F329 Major depressive disorder, single episode, unspecified: Secondary | ICD-10-CM | POA: Diagnosis present

## 2017-01-08 DIAGNOSIS — K7031 Alcoholic cirrhosis of liver with ascites: Principal | ICD-10-CM | POA: Diagnosis present

## 2017-01-08 DIAGNOSIS — R188 Other ascites: Secondary | ICD-10-CM

## 2017-01-08 DIAGNOSIS — B182 Chronic viral hepatitis C: Secondary | ICD-10-CM | POA: Diagnosis present

## 2017-01-08 DIAGNOSIS — Z800 Family history of malignant neoplasm of digestive organs: Secondary | ICD-10-CM

## 2017-01-08 DIAGNOSIS — K922 Gastrointestinal hemorrhage, unspecified: Secondary | ICD-10-CM | POA: Diagnosis present

## 2017-01-08 DIAGNOSIS — F418 Other specified anxiety disorders: Secondary | ICD-10-CM | POA: Diagnosis not present

## 2017-01-08 DIAGNOSIS — D62 Acute posthemorrhagic anemia: Secondary | ICD-10-CM | POA: Diagnosis present

## 2017-01-08 LAB — I-STAT CHEM 8, ED
BUN: 20 mg/dL (ref 6–20)
CALCIUM ION: 1.11 mmol/L — AB (ref 1.15–1.40)
CREATININE: 0.9 mg/dL (ref 0.44–1.00)
Chloride: 94 mmol/L — ABNORMAL LOW (ref 101–111)
GLUCOSE: 214 mg/dL — AB (ref 65–99)
HCT: 34 % — ABNORMAL LOW (ref 36.0–46.0)
HEMOGLOBIN: 11.6 g/dL — AB (ref 12.0–15.0)
Potassium: 5.6 mmol/L — ABNORMAL HIGH (ref 3.5–5.1)
Sodium: 127 mmol/L — ABNORMAL LOW (ref 135–145)
TCO2: 21 mmol/L (ref 0–100)

## 2017-01-08 LAB — BASIC METABOLIC PANEL
ANION GAP: 10 (ref 5–15)
BUN: 20 mg/dL (ref 6–20)
CO2: 18 mmol/L — AB (ref 22–32)
Calcium: 8.2 mg/dL — ABNORMAL LOW (ref 8.9–10.3)
Chloride: 102 mmol/L (ref 101–111)
Creatinine, Ser: 0.86 mg/dL (ref 0.44–1.00)
GFR calc Af Amer: >60 mL/min (ref >60)
GLUCOSE: 123 mg/dL — AB (ref 65–99)
POTASSIUM: 5.3 mmol/L — AB (ref 3.5–5.1)
Sodium: 130 mmol/L — ABNORMAL LOW (ref 135–145)

## 2017-01-08 LAB — COMPREHENSIVE METABOLIC PANEL
ALT: 38 U/L (ref 14–54)
AST: 50 U/L — ABNORMAL HIGH (ref 15–41)
Albumin: 2.6 g/dL — ABNORMAL LOW (ref 3.5–5.0)
Alkaline Phosphatase: 146 U/L — ABNORMAL HIGH (ref 38–126)
Anion gap: 10 (ref 5–15)
BUN: 17 mg/dL (ref 6–20)
CHLORIDE: 96 mmol/L — AB (ref 101–111)
CO2: 19 mmol/L — ABNORMAL LOW (ref 22–32)
Calcium: 9.2 mg/dL (ref 8.9–10.3)
Creatinine, Ser: 1 mg/dL (ref 0.44–1.00)
Glucose, Bld: 219 mg/dL — ABNORMAL HIGH (ref 65–99)
POTASSIUM: 5.6 mmol/L — AB (ref 3.5–5.1)
Sodium: 125 mmol/L — ABNORMAL LOW (ref 135–145)
Total Bilirubin: 3.3 mg/dL — ABNORMAL HIGH (ref 0.3–1.2)
Total Protein: 6.4 g/dL — ABNORMAL LOW (ref 6.5–8.1)

## 2017-01-08 LAB — CBC WITH DIFFERENTIAL/PLATELET
BASOS ABS: 0.2 10*3/uL — AB (ref 0.0–0.1)
Basophils Relative: 1 %
EOS PCT: 1 %
Eosinophils Absolute: 0.2 10*3/uL (ref 0.0–0.7)
HEMATOCRIT: 31.2 % — AB (ref 36.0–46.0)
HEMOGLOBIN: 10.4 g/dL — AB (ref 12.0–15.0)
Lymphocytes Relative: 12 %
Lymphs Abs: 2.4 10*3/uL (ref 0.7–4.0)
MCH: 34.1 pg — ABNORMAL HIGH (ref 26.0–34.0)
MCHC: 33.3 g/dL (ref 30.0–36.0)
MCV: 102.3 fL — ABNORMAL HIGH (ref 78.0–100.0)
MONO ABS: 1.8 10*3/uL — AB (ref 0.1–1.0)
Monocytes Relative: 9 %
NEUTROS PCT: 77 %
Neutro Abs: 15.6 10*3/uL — ABNORMAL HIGH (ref 1.7–7.7)
Platelets: 169 10*3/uL (ref 150–400)
RBC: 3.05 MIL/uL — AB (ref 3.87–5.11)
RDW: 16.8 % — ABNORMAL HIGH (ref 11.5–15.5)
WBC: 20.2 10*3/uL — AB (ref 4.0–10.5)

## 2017-01-08 LAB — HEMOGLOBIN AND HEMATOCRIT, BLOOD
HEMATOCRIT: 26.3 % — AB (ref 36.0–46.0)
Hemoglobin: 8.9 g/dL — ABNORMAL LOW (ref 12.0–15.0)

## 2017-01-08 LAB — PROTIME-INR
INR: 1.42
PROTHROMBIN TIME: 17.4 s — AB (ref 11.4–15.2)

## 2017-01-08 LAB — AMMONIA: AMMONIA: 33 umol/L (ref 9–35)

## 2017-01-08 LAB — POC OCCULT BLOOD, ED: Fecal Occult Bld: POSITIVE — AB

## 2017-01-08 LAB — ABO/RH: ABO/RH(D): O POS

## 2017-01-08 MED ORDER — BUSPIRONE HCL 10 MG PO TABS
15.0000 mg | ORAL_TABLET | ORAL | Status: DC
  Filled 2017-01-08: qty 2

## 2017-01-08 MED ORDER — PIPERACILLIN-TAZOBACTAM 3.375 G IVPB
3.3750 g | Freq: Once | INTRAVENOUS | Status: AC
  Administered 2017-01-08: 3.375 g via INTRAVENOUS
  Filled 2017-01-08: qty 50

## 2017-01-08 MED ORDER — DEXTROSE 50 % IV SOLN
50.0000 mL | Freq: Once | INTRAVENOUS | Status: AC
  Administered 2017-01-08: 50 mL via INTRAVENOUS
  Filled 2017-01-08: qty 50

## 2017-01-08 MED ORDER — MOMETASONE FURO-FORMOTEROL FUM 200-5 MCG/ACT IN AERO
2.0000 | INHALATION_SPRAY | Freq: Two times a day (BID) | RESPIRATORY_TRACT | Status: DC
Start: 2017-01-08 — End: 2017-01-14
  Administered 2017-01-09 – 2017-01-14 (×10): 2 via RESPIRATORY_TRACT
  Filled 2017-01-08 (×2): qty 8.8

## 2017-01-08 MED ORDER — SODIUM CHLORIDE 0.9 % IV BOLUS (SEPSIS)
1000.0000 mL | Freq: Once | INTRAVENOUS | Status: AC
  Administered 2017-01-08: 1000 mL via INTRAVENOUS

## 2017-01-08 MED ORDER — OCTREOTIDE LOAD VIA INFUSION
50.0000 ug | Freq: Once | INTRAVENOUS | Status: AC
  Administered 2017-01-08: 50 ug via INTRAVENOUS
  Filled 2017-01-08: qty 25

## 2017-01-08 MED ORDER — LEVOTHYROXINE SODIUM 50 MCG PO TABS
50.0000 ug | ORAL_TABLET | Freq: Every day | ORAL | Status: DC
  Administered 2017-01-09 – 2017-01-10 (×2): 50 ug via ORAL
  Filled 2017-01-08 (×2): qty 1

## 2017-01-08 MED ORDER — RIFAXIMIN 550 MG PO TABS
550.0000 mg | ORAL_TABLET | Freq: Two times a day (BID) | ORAL | Status: DC
  Administered 2017-01-08 – 2017-01-14 (×12): 550 mg via ORAL
  Filled 2017-01-08 (×12): qty 1

## 2017-01-08 MED ORDER — INSULIN ASPART 100 UNIT/ML IV SOLN: 10.0000 [IU] | Freq: Once | INTRAVENOUS | Status: DC

## 2017-01-08 MED ORDER — PANTOPRAZOLE SODIUM 40 MG IV SOLR
40.0000 mg | Freq: Once | INTRAVENOUS | Status: AC
  Administered 2017-01-08: 40 mg via INTRAVENOUS
  Filled 2017-01-08: qty 40

## 2017-01-08 MED ORDER — SODIUM BICARBONATE 8.4 % IV SOLN
50.0000 meq | Freq: Once | INTRAVENOUS | Status: AC
  Administered 2017-01-08: 50 meq via INTRAVENOUS
  Filled 2017-01-08: qty 50

## 2017-01-08 MED ORDER — DOXEPIN HCL 25 MG PO CAPS
25.0000 mg | ORAL_CAPSULE | Freq: Every day | ORAL | Status: DC
  Administered 2017-01-09 – 2017-01-13 (×6): 25 mg via ORAL
  Filled 2017-01-08 (×8): qty 1

## 2017-01-08 MED ORDER — INSULIN ASPART 100 UNIT/ML ~~LOC~~ SOLN
10.0000 [IU] | Freq: Once | SUBCUTANEOUS | Status: AC
Start: 2017-01-08 — End: 2017-01-08
  Administered 2017-01-08: 10 [IU] via INTRAVENOUS
  Filled 2017-01-08: qty 1

## 2017-01-08 MED ORDER — PANTOPRAZOLE SODIUM 40 MG IV SOLR
40.0000 mg | Freq: Two times a day (BID) | INTRAVENOUS | Status: DC
  Administered 2017-01-08 – 2017-01-10 (×4): 40 mg via INTRAVENOUS
  Filled 2017-01-08 (×4): qty 40

## 2017-01-08 MED ORDER — LACTULOSE 10 GM/15ML PO SOLN
20.0000 g | Freq: Three times a day (TID) | ORAL | Status: DC
  Administered 2017-01-08 – 2017-01-14 (×15): 20 g via ORAL
  Filled 2017-01-08 (×16): qty 30

## 2017-01-08 MED ORDER — BUSPIRONE HCL 15 MG PO TABS
15.0000 mg | ORAL_TABLET | Freq: Every day | ORAL | Status: DC
  Administered 2017-01-09 – 2017-01-14 (×6): 15 mg via ORAL
  Filled 2017-01-08 (×6): qty 1

## 2017-01-08 MED ORDER — MELOXICAM 7.5 MG PO TABS
7.5000 mg | ORAL_TABLET | Freq: Two times a day (BID) | ORAL | Status: DC | PRN
  Administered 2017-01-09: 7.5 mg via ORAL
  Filled 2017-01-08 (×2): qty 1

## 2017-01-08 MED ORDER — DEXTROSE 5 % IV SOLN
2.0000 g | INTRAVENOUS | Status: DC
  Administered 2017-01-08 – 2017-01-09 (×2): 2 g via INTRAVENOUS
  Filled 2017-01-08 (×2): qty 2

## 2017-01-08 MED ORDER — SODIUM CHLORIDE 0.9% FLUSH
3.0000 mL | Freq: Two times a day (BID) | INTRAVENOUS | Status: DC
  Administered 2017-01-08 – 2017-01-13 (×7): 3 mL via INTRAVENOUS

## 2017-01-08 MED ORDER — DICLOFENAC SODIUM 1 % TD GEL
4.0000 g | Freq: Four times a day (QID) | TRANSDERMAL | Status: DC
  Administered 2017-01-12 – 2017-01-14 (×9): 4 g via TOPICAL
  Filled 2017-01-08: qty 100

## 2017-01-08 MED ORDER — VANCOMYCIN HCL IN DEXTROSE 1-5 GM/200ML-% IV SOLN
1000.0000 mg | Freq: Once | INTRAVENOUS | Status: AC
  Administered 2017-01-08: 1000 mg via INTRAVENOUS
  Filled 2017-01-08: qty 200

## 2017-01-08 MED ORDER — SODIUM CHLORIDE 0.9 % IV SOLN
1.0000 g | Freq: Once | INTRAVENOUS | Status: AC
  Administered 2017-01-09: 1 g via INTRAVENOUS
  Filled 2017-01-08: qty 10

## 2017-01-08 MED ORDER — SODIUM POLYSTYRENE SULFONATE 15 GM/60ML PO SUSP
15.0000 g | Freq: Once | ORAL | Status: AC
  Administered 2017-01-09: 15 g via ORAL
  Filled 2017-01-08: qty 60

## 2017-01-08 MED ORDER — ALBUTEROL SULFATE (2.5 MG/3ML) 0.083% IN NEBU: 2.5000 mg | INHALATION_SOLUTION | Freq: Four times a day (QID) | RESPIRATORY_TRACT | Status: DC | PRN

## 2017-01-08 MED ORDER — BUSPIRONE HCL 15 MG PO TABS
30.0000 mg | ORAL_TABLET | Freq: Every day | ORAL | Status: DC
  Administered 2017-01-08 – 2017-01-13 (×6): 30 mg via ORAL
  Filled 2017-01-08 (×6): qty 2

## 2017-01-08 MED ORDER — SODIUM CHLORIDE 0.9 % IV SOLN
50.0000 ug/h | INTRAVENOUS | Status: DC
  Administered 2017-01-08 – 2017-01-13 (×8): 50 ug/h via INTRAVENOUS
  Filled 2017-01-08 (×18): qty 1

## 2017-01-08 MED ORDER — HYDROXYZINE HCL 25 MG PO TABS
25.0000 mg | ORAL_TABLET | Freq: Three times a day (TID) | ORAL | Status: DC
  Administered 2017-01-08 – 2017-01-14 (×17): 25 mg via ORAL
  Filled 2017-01-08 (×18): qty 1

## 2017-01-08 MED ORDER — TIOTROPIUM BROMIDE MONOHYDRATE 18 MCG IN CAPS
18.0000 ug | ORAL_CAPSULE | Freq: Every day | RESPIRATORY_TRACT | Status: DC
  Administered 2017-01-09 – 2017-01-14 (×6): 18 ug via RESPIRATORY_TRACT
  Filled 2017-01-08 (×2): qty 5

## 2017-01-08 NOTE — ED Notes (Signed)
No addl lab draw,  Pt enroute to inpatient floor. 

## 2017-01-08 NOTE — Telephone Encounter (Signed)
Pt is in ED.

## 2017-01-08 NOTE — Telephone Encounter (Signed)
Call to patient she reports she is having bloody diarrhea, and vomited up a little blood states her liver is hurting and her heart is beating fast. She denies chest pain. Advised patient to see PCP she stated she could not come today, advised patient to go to ER or call 911 she stated she did not want to at this time she might do it later. Encouraged patient that is symptoms worsen to please call 911 or go to ER immediately.

## 2017-01-08 NOTE — ED Triage Notes (Signed)
Patient reports worsening Shortness of Breath since yesterday. Patient reports multiple occasions of vomiting and/or coughing up blood. The patient reports taking her home medications as scheduled.

## 2017-01-08 NOTE — H&P (Signed)
Date: 01/08/2017               Patient Name:  [name] [name] MRN: [id number]  DOB: 1960-11-20 Age / Sex: 56 y.o., female   PCP: Milagros Loll, MD         Medical Service: Internal Medicine Teaching Service         Attending Physician: Dr. Lucious Groves, DO    First Contact: Dr. Danford Bad Pager: [phone]  Second Contact: Dr. Benjamine Mola Pager: [phone]       After Hours (After 5p/  First Contact Pager: [phone]  weekends / holidays): Second Contact Pager: [phone]   Chief Complaint: Shortness of breath and hematemesis for one day.  History of Present Illness: [name] [name] is a 56 y.o.lady,PMHx Is significant for alcoholic liver cirrhosis with esophageal varices, COPD, cervical cancer, presented to ED with complaint of worsening shortness of breath and hematemesis for one day.  Patient states that she noticed worsening in her usual shortness of breath for last few days, she developed nausea and vomiting, vomited pure blood 3 times yesterday and 2 times this morning. She also complain of having right upper quadrant pain describing as her liver pain, she did complain worse that she is having generalized body aches but on further questioning saying that her liver hurts. She also endorses some chills, denies any fever, no change in her chronic cough, no nasal congestion, no headache, no chest pain. Patient states that she is being compliant with her medicines, having one heart movement every day, occasionally have hematochezia, denies melena. She also complained of dysuria but denies hematuria.  In ED she was found to have leukocytosis and hyperkalemia with peak T waves in anterolateral leads, hemoglobin stable and no witnessed hematemesis. She was given insulin 10 units along with 50% dextrose in ED.  Meds:  Current Meds  Medication Sig  . albuterol (PROVENTIL HFA;VENTOLIN HFA) 108 (90 Base) MCG/ACT inhaler Inhale 2 puffs into the lungs every 6 (six) hours as needed for wheezing or shortness of  breath.  Marland Kitchen albuterol (PROVENTIL) (2.5 MG/3ML) 0.083% nebulizer solution Take 3 mLs (2.5 mg total) by nebulization every 6 (six) hours as needed for wheezing or shortness of breath.  . busPIRone (BUSPAR) 15 MG tablet Take 1-2 tablets (15-30 mg total) by mouth See admin instructions. 15 mg in the morning then 30 mg in the evening  . doxepin (SINEQUAN) 25 MG capsule Take 25 mg by mouth at bedtime.  . furosemide (LASIX) 80 MG tablet Take 1 tablet (80 mg total) by mouth daily.  . hydrOXYzine (ATARAX/VISTARIL) 25 MG tablet Take 1 tablet (25 mg total) by mouth 3 (three) times daily.  Marland Kitchen lactulose (CHRONULAC) 10 GM/15ML solution Take 30 mLs (20 g total) by mouth 3 (three) times daily.  Marland Kitchen levothyroxine (SYNTHROID, LEVOTHROID) 50 MCG tablet TAKE 1 TABLET (50 MCG TOTAL) BY MOUTH DAILY BEFORE BREAKFAST.  Marland Kitchen LORazepam (ATIVAN) 1 MG tablet Take 1 tablet (1 mg total) by mouth 2 (two) times daily.  . Melatonin 1 MG CAPS Take 1 mg by mouth at bedtime.  . meloxicam (MOBIC) 7.5 MG tablet Take 1 tablet (7.5 mg total) by mouth 2 (two) times daily as needed for pain.  . mometasone-formoterol (DULERA) 200-5 MCG/ACT AERO Inhale 2 puffs into the lungs 2 (two) times daily.  . pantoprazole (PROTONIX) 40 MG tablet Take 1 tablet (40 mg total) by mouth daily.  . Potassium Chloride ER 20 MEQ TBCR Take 20 mEq by mouth 2 (two)  times daily.  . propranolol (INDERAL) 20 MG tablet Take 1 tablet (20 mg total) by mouth daily.  . rifaximin (XIFAXAN) 550 MG TABS tablet Take 1 tablet (550 mg total) by mouth 2 (two) times daily.  Marland Kitchen spironolactone (ALDACTONE) 100 MG tablet Take 2 tablets (200 mg total) by mouth daily.  Marland Kitchen tiotropium (SPIRIVA HANDIHALER) 18 MCG inhalation capsule Place 1 capsule (18 mcg total) into inhaler and inhale daily.  . traMADol (ULTRAM) 50 MG tablet Take 1 tablet (50 mg total) by mouth every 12 (twelve) hours as needed for severe pain.  Marland Kitchen VOLTAREN 1 % GEL APPLY 4 G TOPICALLY 4 (FOUR) TIMES DAILY.      Allergies: Allergies as of 01/08/2017 - Review Complete 01/08/2017  Allergen Reaction Noted  . Ibuprofen Other (See Comments) 02/26/2016  . Tylenol [acetaminophen] Other (See Comments) 03/29/2015  . Amitriptyline Other (See Comments) 01/22/2013   Past Medical History:  Diagnosis Date  . Adverse drug effect - [place] 03/07/2015  . Alcoholic cirrhosis ([place])   . Allergy   . Anxiety   . Arthritis   . cervical Cancer 1999  . Cholelithiasis   . COPD (chronic obstructive pulmonary disease) ([place])   . Decompensated hepatic cirrhosis ([place]) 03/07/2015   Side effect of Vikera  . Depression   . Esophageal varices ([name])   . Esophagitis 2010  . Fatty liver   . Gastritis   . GERD (gastroesophageal reflux disease)   . Heart palpitations   . Hepatitis C    chronic hepatitis C and Steatohepatitis (hep grade 2, stage 2-3) per 05/13/08 liver biopsy  . Hypertension   . Obesity   . Portal hypertension ([place])   . Shortness of breath   . Splenomegaly   . Tubulovillous adenoma of colon   . Visual hallucination    since 09/2010/notes 10/30/2012    Family History: Daughter and sister has cervical cancer. Multiple Aunts and uncles had liver cirrhosis.  Social History: Current smoker, decreased to 1-2 cigarettes per day, used to smoke 1 pack per day. Quit alcohol drinking 3-4 years ago. Denies any illicit drug use.  Review of Systems: A complete ROS was negative except as per HPI.   Physical Exam: Blood pressure 99/67, pulse 85, temperature 97.8 F (36.6 C), temperature source Oral, resp. rate (!) 22, height 5\' 4"  (1.626 m), weight 165 lb 2 oz (74.9 kg), SpO2 94 %. Vitals:   01/08/17 2124 01/08/17 2130 01/08/17 2145 01/08/17 2228  BP: 96/59 99/64 118/70 99/67  Pulse: 88 90 92 85  Resp: 13 22 26  (!) 22  Temp:    97.8 F (36.6 C)  TempSrc:    Oral  SpO2: 92% 91% 91% 94%  Weight:    165 lb 2 oz (74.9 kg)  Height:    5\' 4"  (1.626 m)   General: Vital signs reviewed.  Patient is  well-developed,Appears confused but was able to answer questions appropriately, in no acute distress and cooperative with exam.  Head: Normocephalic and atraumatic. Eyes: EOMI, conjunctivae normal, mild scleral icterus.  Neck: Supple, trachea midline, normal ROM, no JVD, masses, thyromegaly, or carotid bruit present.  Cardiovascular: RRR, S1 normal, S2 normal, no murmurs, gallops, or rubs. Pulmonary/Chest: Clear to auscultation bilaterally, no wheezes, rales, or rhonchi. Abdominal: Soft, non-tender, non-distended, BS +, no masses, organomegaly, or guarding present.  Extremities: 1+ pitting  lower extremity edema bilaterally up to mid legs, mild asterixis noted , clean bandage on left lower leg, pulses symmetric and intact bilaterally. No cyanosis  or clubbing. Neurological: A&O x3, Strength is normal and symmetric bilaterally, cranial nerve II-XII are grossly intact, no focal motor deficit, sensory intact to light touch bilaterally.  Skin: Warm, dry and intact. No rashes or erythema.  Labs. CMP Latest Ref Rng & Units 01/08/2017 01/08/2017 01/08/2017  Glucose 65 - 99 mg/dL 123(H) 214(H) 219(H)  BUN 6 - 20 mg/dL 20 20 17   Creatinine 0.44 - 1.00 mg/dL 0.86 0.90 1.00  Sodium 135 - 145 mmol/L 130(L) 127(L) 125(L)  Potassium 3.5 - 5.1 mmol/L 5.3(H) 5.6(H) 5.6(H)  Chloride 101 - 111 mmol/L 102 94(L) 96(L)  CO2 22 - 32 mmol/L 18(L) - 19(L)  Calcium 8.9 - 10.3 mg/dL 8.2(L) - 9.2  Total Protein 6.5 - 8.1 g/dL - - 6.4(L)  Total Bilirubin 0.3 - 1.2 mg/dL - - 3.3(H)  Alkaline Phos 38 - 126 U/L - - 146(H)  AST 15 - 41 U/L - - 50(H)  ALT 14 - 54 U/L - - 38   CBC    Component Value Date/Time   WBC 20.2 (H) 01/08/2017 1617   RBC 3.05 (L) 01/08/2017 1617   HGB 8.9 (L) 01/08/2017 2239   HCT 26.3 (L) 01/08/2017 2239   PLT 169 01/08/2017 1617   MCV 102.3 (H) 01/08/2017 1617   MCH 34.1 (H) 01/08/2017 1617   MCHC 33.3 01/08/2017 1617   RDW 16.8 (H) 01/08/2017 1617   LYMPHSABS 2.4 01/08/2017 1617    MONOABS 1.8 (H) 01/08/2017 1617   EOSABS 0.2 01/08/2017 1617   BASOSABS 0.2 (H) 01/08/2017 1617   Ammonia. 33 PT/INR. 17.4 / 1.42 FOBT. Positive  EKG: Sinus rhythm with peaked T waves in anterolateral leads. New as compared to previous EKG.  CXR: FINDINGS: Stable normal cardiac silhouette. Aortic atherosclerosis with calcification. Clear lungs. No pleural effusion or pneumothorax. Degenerative changes of the thoracic spine.  IMPRESSION: No active disease.  Assessment & Plan by Problem: [name] [name] is a 56 y.o.lady,PMHx Is significant for alcoholic liver cirrhosis with esophageal varices, COPD, cervical cancer, presented to ED with complaint of worsening shortness of breath and hematemesis for one day.  Upper GI bleed with history of esophageal varices. She did complained of having hematemesis, she had an history of esophageal varices and her FOBT is positive. She denies any ongoing nausea and vomiting. No witnessed hematemesis and her hemoglobin is stable. Creatinine remained within normal limit.Her blood pressure is soft. She got octreotide in ED. -Admit to step down because of softer blood pressure. -Monitor for any ongoing hematemesis. -Monitor CBC. -Protonix IV 40 mg twice daily. -GI consult if needed.  Hyperkalemia.She was found to be hyperkalemic with potassium of 5.6 and new peaked T waves in anterolateral leads. Denies any chest pain. She was given 10 units of insulin and one ampule of 50% dextrose in ED. -Repeat BMP-shows potassium of 5.3, she was given calcium gluconate and Kayexalate. -Repeat BMP in the morning. -Repeat EKG in the morning.  Hyponatremia. Can be multifactorial because of her cirrhosis and vomiting. Sodium was 125 on presentation. She got 3 L of normal saline bolus in ED. -Repeat BMP-shows sodium of 130. -Repeat BMP in the morning, no more IV fluid.  Leukocytosis/sepsis. She does meet criteria for sepsis being hypotensive and having white cell count  of 20.2. She remained afebrile. No obvious source of infection. SBP or UTI can be a possibility. She was given a dose of vancomycin and Zosyn in ED. She was also given 3 L of normal saline bolus. Which did improve her  blood pressure. She might need paracentesis  to rule out SBP. -UA-pending. -Blood cultures-pending -Ultrasound abdomen. -Ceftriaxone for possible SBP/ UTI/ vericeal bleed.  Alcoholic liver cirrhosis.Her child Pugh score of 10, which make her class C and MELD-Na score of 24-14-15% mortality within 90 days. She has previous admissions because of encephalopathy. Currently she looks confused, but was able to answer questions appropriately. Her ammonia level is within normal limits. She states that she is compliant with her lactulose and rifaximin, but having one hard bowel movement daily, instead of 2-3 soft bowel movements. -Continue home dose of lactulose 20 g 3 times daily-titrate to have 2-3 soft bowel movements daily. -Continue rifaximin-550 mg twice daily.  Hypothyroidism. Her last TSH done on 11/28/2016 was 20.583, with free T4 of 1.05. She might need adjustment in her levothyroxine dose. -Repeat TSH and free T4. -Continue home dose of Synthroid 50 MCG daily.  COPD.No current exacerbation. She was saturating well on room air and chest exam is without any wheezing. -Continue home dose of Spiriva, Dulera and albuterol.  History of anxiety/depression. No current exacerbation. -Continue home dose of doxepin.  DVT prophylaxis. SCD CODE STATUS. Full Diet. Nothing by mouth.  Dispo: Admit patient to Inpatient with expected length of stay greater than 2 midnights.  Signed: Lorella Nimrod, MD 01/08/2017, 11:41 PM  Pager: [phone]
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
## 2017-01-08 NOTE — Telephone Encounter (Signed)
Patient call state she vomit blood and diarrhea. Tried to get triage but patient hung up. Please contact

## 2017-01-09 ENCOUNTER — Encounter (HOSPITAL_COMMUNITY): Payer: Self-pay | Admitting: *Deleted

## 2017-01-09 ENCOUNTER — Encounter (HOSPITAL_COMMUNITY)
Admission: EM | Disposition: A | Discharge status: Home / Self Care | Payer: Self-pay | Source: Home / Self Care | Attending: Infectious Disease

## 2017-01-09 ENCOUNTER — Inpatient Hospital Stay (HOSPITAL_COMMUNITY): Payer: Medicaid Other | Admitting: Anesthesiology

## 2017-01-09 ENCOUNTER — Inpatient Hospital Stay (HOSPITAL_COMMUNITY): Payer: Medicaid Other

## 2017-01-09 DIAGNOSIS — B182 Chronic viral hepatitis C: Secondary | ICD-10-CM

## 2017-01-09 DIAGNOSIS — K8020 Calculus of gallbladder without cholecystitis without obstruction: Secondary | ICD-10-CM

## 2017-01-09 DIAGNOSIS — D696 Thrombocytopenia, unspecified: Secondary | ICD-10-CM

## 2017-01-09 HISTORY — PX: ESOPHAGOGASTRODUODENOSCOPY (EGD) WITH PROPOFOL: SHX5813

## 2017-01-09 LAB — COMPREHENSIVE METABOLIC PANEL
ALT: 35 U/L (ref 14–54)
ANION GAP: 6 (ref 5–15)
AST: 44 U/L — ABNORMAL HIGH (ref 15–41)
Albumin: 2 g/dL — ABNORMAL LOW (ref 3.5–5.0)
Alkaline Phosphatase: 84 U/L (ref 38–126)
BUN: 27 mg/dL — ABNORMAL HIGH (ref 6–20)
CHLORIDE: 104 mmol/L (ref 101–111)
CO2: 18 mmol/L — AB (ref 22–32)
CREATININE: 0.91 mg/dL (ref 0.44–1.00)
Calcium: 8.4 mg/dL — ABNORMAL LOW (ref 8.9–10.3)
GFR calc Af Amer: >60 mL/min (ref >60)
Glucose, Bld: 150 mg/dL — ABNORMAL HIGH (ref 65–99)
POTASSIUM: 5.3 mmol/L — AB (ref 3.5–5.1)
SODIUM: 128 mmol/L — AB (ref 135–145)
Total Bilirubin: 2.5 mg/dL — ABNORMAL HIGH (ref 0.3–1.2)
Total Protein: 4.7 g/dL — ABNORMAL LOW (ref 6.5–8.1)

## 2017-01-09 LAB — CBC
HCT: 22.7 % — ABNORMAL LOW (ref 36.0–46.0)
HEMOGLOBIN: 7.9 g/dL — AB (ref 12.0–15.0)
MCH: 35.6 pg — ABNORMAL HIGH (ref 26.0–34.0)
MCHC: 34.8 g/dL (ref 30.0–36.0)
MCV: 102.3 fL — ABNORMAL HIGH (ref 78.0–100.0)
PLATELETS: 103 10*3/uL — AB (ref 150–400)
RBC: 2.22 MIL/uL — AB (ref 3.87–5.11)
RDW: 17.3 % — ABNORMAL HIGH (ref 11.5–15.5)
WBC: 12.2 10*3/uL — AB (ref 4.0–10.5)

## 2017-01-09 LAB — MRSA PCR SCREENING: MRSA by PCR: NEGATIVE

## 2017-01-09 LAB — TSH: TSH: 0.77 u[IU]/mL (ref 0.350–4.500)

## 2017-01-09 LAB — URINALYSIS, ROUTINE W REFLEX MICROSCOPIC
Bilirubin Urine: NEGATIVE
GLUCOSE, UA: 50 mg/dL — AB
Hgb urine dipstick: NEGATIVE
Ketones, ur: NEGATIVE mg/dL
LEUKOCYTES UA: NEGATIVE
NITRITE: NEGATIVE
PH: 5 (ref 5.0–8.0)
Protein, ur: NEGATIVE mg/dL
Specific Gravity, Urine: 1.015 (ref 1.005–1.030)

## 2017-01-09 LAB — HEMOGLOBIN AND HEMATOCRIT, BLOOD
HCT: 27.7 % — ABNORMAL LOW (ref 36.0–46.0)
HEMOGLOBIN: 9.5 g/dL — AB (ref 12.0–15.0)

## 2017-01-09 LAB — T4, FREE: FREE T4: 1.32 ng/dL — AB (ref 0.61–1.12)

## 2017-01-09 LAB — PREPARE RBC (CROSSMATCH)

## 2017-01-09 SURGERY — ESOPHAGOGASTRODUODENOSCOPY (EGD) WITH PROPOFOL
Anesthesia: General

## 2017-01-09 MED ORDER — CAMPHOR-MENTHOL 0.5-0.5 % EX LOTN
TOPICAL_LOTION | CUTANEOUS | Status: DC | PRN
  Administered 2017-01-09: 13:00:00 via TOPICAL
  Administered 2017-01-10: 1 via TOPICAL
  Filled 2017-01-09: qty 222

## 2017-01-09 MED ORDER — LIDOCAINE 2% (20 MG/ML) 5 ML SYRINGE
INTRAMUSCULAR | Status: DC | PRN
  Administered 2017-01-09: 60 mg via INTRAVENOUS

## 2017-01-09 MED ORDER — FENTANYL CITRATE (PF) 100 MCG/2ML IJ SOLN
INTRAMUSCULAR | Status: DC | PRN
  Administered 2017-01-09: 50 ug via INTRAVENOUS

## 2017-01-09 MED ORDER — MAGIC MOUTHWASH W/LIDOCAINE
5.0000 mL | Freq: Four times a day (QID) | ORAL | Status: DC | PRN
  Administered 2017-01-09 – 2017-01-10 (×2): 5 mL via ORAL
  Filled 2017-01-09 (×3): qty 5

## 2017-01-09 MED ORDER — PHENYLEPHRINE 40 MCG/ML (10ML) SYRINGE FOR IV PUSH (FOR BLOOD PRESSURE SUPPORT)
PREFILLED_SYRINGE | INTRAVENOUS | Status: DC | PRN
  Administered 2017-01-09 (×3): 80 ug via INTRAVENOUS

## 2017-01-09 MED ORDER — SODIUM CHLORIDE 0.9 % IV SOLN
INTRAVENOUS | Status: DC
  Administered 2017-01-09: 13:00:00 via INTRAVENOUS

## 2017-01-09 MED ORDER — CETAPHIL MOISTURIZING EX LOTN
TOPICAL_LOTION | Freq: Two times a day (BID) | CUTANEOUS | Status: DC
  Administered 2017-01-09: 1 via TOPICAL
  Administered 2017-01-09 – 2017-01-10 (×2): via TOPICAL
  Administered 2017-01-10: 1 via TOPICAL
  Administered 2017-01-11 – 2017-01-12 (×3): via TOPICAL
  Administered 2017-01-12: 1 via TOPICAL
  Administered 2017-01-13 – 2017-01-14 (×3): via TOPICAL
  Filled 2017-01-09: qty 473

## 2017-01-09 MED ORDER — CHLORHEXIDINE GLUCONATE 0.12 % MT SOLN
15.0000 mL | Freq: Two times a day (BID) | OROMUCOSAL | Status: DC
  Administered 2017-01-09 – 2017-01-14 (×11): 15 mL via OROMUCOSAL
  Filled 2017-01-09 (×11): qty 15

## 2017-01-09 MED ORDER — ONDANSETRON HCL 4 MG/2ML IJ SOLN
INTRAMUSCULAR | Status: DC | PRN
  Administered 2017-01-09: 4 mg via INTRAVENOUS

## 2017-01-09 MED ORDER — PROPOFOL 10 MG/ML IV BOLUS
INTRAVENOUS | Status: DC | PRN
  Administered 2017-01-09: 120 mg via INTRAVENOUS

## 2017-01-09 MED ORDER — FENTANYL CITRATE (PF) 100 MCG/2ML IJ SOLN: 25.0000 ug | INTRAMUSCULAR | Status: DC | PRN

## 2017-01-09 MED ORDER — LACTATED RINGERS IV SOLN
INTRAVENOUS | Status: DC | PRN
  Administered 2017-01-09: 14:00:00 via INTRAVENOUS

## 2017-01-09 MED ORDER — MIDAZOLAM HCL 5 MG/5ML IJ SOLN
INTRAMUSCULAR | Status: DC | PRN
  Administered 2017-01-09: 2 mg via INTRAVENOUS

## 2017-01-09 MED ORDER — PROMETHAZINE HCL 25 MG/ML IJ SOLN: 6.2500 mg | INTRAMUSCULAR | Status: DC | PRN

## 2017-01-09 MED ORDER — SUCCINYLCHOLINE CHLORIDE 200 MG/10ML IV SOSY
PREFILLED_SYRINGE | INTRAVENOUS | Status: DC | PRN
  Administered 2017-01-09: 80 mg via INTRAVENOUS

## 2017-01-09 SURGICAL SUPPLY — 15 items

## 2017-01-09 NOTE — Progress Notes (Signed)
Patient c/o "liver pain" that "comes and goes".  States she "just wants it to go away" and that the medications we have ordered "don't work".  MD notified.  MD states they would like for her to try the PRN dose of meloxicam before ordering anything else.  Information relayed to patient and patient educated.  Patient agreed to taking medication but still insists that it "won't work".

## 2017-01-09 NOTE — Progress Notes (Signed)
Subjective: [name] [name] was seen and evaluated today at bedside. Feels better since admission. Denies any more bloody bowel movements or hematemesis. Reports compliance with her lactulose and rifaxamin at home. Abdominal pain improved. Denies distention, fevers or chills.   Objective:  Vital signs in last 24 hours: Vitals:   01/09/17 0400 01/09/17 0500 01/09/17 0600 01/09/17 0900  BP: (!) 87/49 (!) 94/53 (!) 86/53 103/63  Pulse: 83 79 89 100  Resp: 17 16 16  (!) 22  Temp:      TempSrc:      SpO2: 93% 93% 93% 91%  Weight:  169 lb 1.5 oz (76.7 kg)    Height:       General: Chronically-ill appearing caucasian female resting comfortably in bed. In no acute distress. HENT: PERRL. EOMI. No conjunctival injection, icterus or ptosis. Oropharynx clear, mucous membranes moist.  Cardiovascular: Regular rate and rhythm. No murmur or rub appreciated. Pulmonary: CTA BL, no wheezing. Unlabored breathing.  Abdomen: Soft, non-tender and non-distended. No guarding or rigidity. +bowel sounds.  Extremities: Some peripheral edema BL LE. Otherwise warm and well perfused.  Neuro: Strength and sensation grossly intact.  Psych: Mood anxious and affect was mood congruent. Responds to questions appropriately however tangential speech at times.    Assessment/Plan:  Principal Problem:   Gastrointestinal hemorrhage with hematemesis Active Problems:   History of hepatitis C   Anxiety   Essential hypertension, benign   Esophageal varices in cirrhosis (HCC)   Hepatic encephalopathy (HCC)   Cirrhosis (HCC)   Hypothyroidism   Hyperkalemia   GI bleed  Gastrointestinal Hemorrhage with Hematemesis, Upper GI, +FOBT: Received Octreotide in ED. [name] [name] denies any more hematemesis, hematochezia nor melena since admission. Abdominal pain improved. Blood pressures have been on the softer side however she has significant cirrhosis and often is within this range. Hb trended down over the past 24 hours ( 11.6 -->  8.9 --> 7.9) however she did receive about 3L IVF over this time. Leukocytosis of 20.2 on admission c/w UGIB. She has known history of esophageal varices as documented on EGD and CT, and is on propranolol 20 mg daily at home. Could not find documentation in EMR of prior upper GI bleed. Her last scope appears to have been in 2012. Chronically thrombocytopenic. We have consulted GI for their advice in management of this patient. ?Banding? -NPO -On propranolol 20 mg once daily. Could consider increasing this if HR were to tolerate. -IV protonix 40 mg BID -Monitor CBC -GI on board, appreciate their recommendations. Appears she will go for EGD today +/- banding. Greatly appreciate GI's assistance! -Avoid antiplatelets and anticoags.  Doubt Sepsis: Leukocytosis could be explained by upper GI bleed and she is chronically with soft blood pressures secondary to cirrhosis. She is afebrile and not tachycardic. Will keep patient on IV Ceftriaxone however question necessity of vancomycin. 2-view chest x-ray reviewed and was not convincing for PNA.   Alcohol-Associated Cirrhosis, Hepatitis C (SVR12 in 2016), Hx of Elevated AFP, Thrombocytopenia: Given her leukocytosis on presentation, there was concern for SBP in this patient with GI bleed + known cirrhosis and varices. She was started on empiric IV Ceftriaxone. On exam today, abdomen was without tenderness and it was quite soft and not distended. Abdominal US today with minimal ascites however unsurprisingly did demonstrate cirrhosis and cholelithiasis without changes in her chronic mild gallbladder wall thickening. She has been evaluated by [facility] for transplant in early 2017 at which time her MELD score was 17.  She is considered  to be a poor transplant candidate due to poor social support and also underlying psychiatric dx.  -Leukocytosis on admission at 20.2, now 12.2 -IV Ceftriaxone  -Clinical suspicion for SBP low although we are considering diagnostic para given  her leukocytosis. Would appreciate GI recs regarding this -AFP 09/2011 13.6 --> 54.6 (03/2012).    COPD: Lungs clear and remains saturating well. Will continue home Spiriva, Dulera and Albuterol.   Hypothyroidism: Currently on synthroid 50 mcg daily. This appears to be recently diagnosed. Unclear if was initially subclinical hypothyroidism at time of diagnosis as there was no free T4.   Hyperkalemia, Hyponatremia: 5.6 on admission, repeat this morning 5.3 after calcium gluconate and kayexalate. EKG with questionable peaked t-waves. Sodium 125 on admission, 128 this morning. Will repeat BMET in AM.  Dispo: Anticipated discharge pending GI recommendations.   Justus Duerr, DO 01/09/2017, 11:16 AM Pager: [phone]

## 2017-01-09 NOTE — Op Note (Signed)
[facility] Patient Name: [name] [name] Procedure Date : 01/09/2017 MRN: [id number] Attending MD: Otis Brace , MD Date of Birth: 24-Jan-1961 CSN: [id number] Age: 56 Admit Type: Inpatient Procedure:                Upper GI endoscopy Indications:              Hematemesis Providers:                Otis Brace, MD, Vista Lawman, RN, William Dalton, Technician Referring MD:              Medicines:                General Anesthesia Complications:            No immediate complications. Estimated Blood Loss:     Estimated blood loss was minimal. Procedure:                Pre-Anesthesia Assessment:                           - Prior to the procedure, a History and Physical                            was performed, and patient medications and                            allergies were reviewed. The patient's tolerance of                            previous anesthesia was also reviewed. The risks                            and benefits of the procedure and the sedation                            options and risks were discussed with the patient.                            All questions were answered, and informed consent                            was obtained. Prior Anticoagulants: The patient has                            taken no previous anticoagulant or antiplatelet                            agents. ASA Grade Assessment: III - A patient with                            severe systemic disease. After reviewing the risks                            and  benefits, the patient was deemed in                            satisfactory condition to undergo the procedure.                           After obtaining informed consent, the endoscope was                            passed under direct vision. Throughout the                            procedure, the patient's blood pressure, pulse, and                            oxygen saturations were  monitored continuously. The                            EG-[id number] ([id number]) scope was introduced through the                            mouth, and advanced to the second part of duodenum.                            The upper GI endoscopy was technically difficult                            and complex due to presence of food. The patient                            tolerated the procedure well. Scope In: Scope Out: Findings:      Multiple large (> 5 mm) varices were found in the lower third of the       esophagus, 30 to 37 cm from the incisors. They were large in size.       Stigmata of recent bleeding were evident and red wale signs were       present. [Prior Treatment]. Five bands were successfully placed with       incomplete eradication of varices. There was no bleeding during, and at       the end, of the procedure.      A large amount of food (residue) was found in the gastric body.      Food (residue) was found in the duodenal bulb.      The second portion of the duodenum was normal. Impression:               - Recently bleeding large (> 5 mm) esophageal                            varices. Incompletely eradicated. Banded.                           - A large amount of food (residue) in the stomach.                           -  Retained food in the duodenum.                           - Normal second portion of the duodenum.                           - No specimens collected. Moderate Sedation:      none Recommendation:           - Return patient to hospital ward for ongoing care.                           - NPO.                           - Continue present medications.                           - Repeat upper endoscopy in 3 weeks for retreatment. Procedure Code(s):        --- Professional ---                           [phone], Esophagogastroduodenoscopy, flexible,                            transoral; with band ligation of esophageal/gastric                             varices Diagnosis Code(s):        --- Professional ---                           I85.01, Esophageal varices with bleeding                           K92.0, Hematemesis CPT copyright 2016 [employer]. All rights reserved. The codes documented in this report are preliminary and upon coder review may  be revised to meet current compliance requirements. Otis Brace, MD Otis Brace, MD 01/09/2017 3:04:17 PM Number of Addenda: 0

## 2017-01-09 NOTE — Telephone Encounter (Signed)
Thanks [name] [name]!
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
## 2017-01-09 NOTE — Anesthesia Postprocedure Evaluation (Addendum)
Anesthesia Post Note  Patient: [name] [name]  Procedure(s) Performed: Procedure(s) (LRB): ESOPHAGOGASTRODUODENOSCOPY (EGD) WITH PROPOFOL (N/A)  Patient location during evaluation: PACU Anesthesia Type: General Level of consciousness: awake and alert Pain management: pain level controlled Vital Signs Assessment: post-procedure vital signs reviewed and stable Respiratory status: spontaneous breathing, nonlabored ventilation, respiratory function stable and patient connected to nasal cannula oxygen Cardiovascular status: blood pressure returned to baseline and stable Postop Assessment: no signs of nausea or vomiting Anesthetic complications: no       Last Vitals:  Vitals:   01/09/17 1547 01/09/17 1645  BP: (!) 96/54 118/73  Pulse: 93 94  Resp: 17 (!) 21  Temp:  36.6 C    Last Pain:  Vitals:   01/09/17 1645  TempSrc: Oral  PainSc:                  Karley Pho S

## 2017-01-09 NOTE — Progress Notes (Signed)
OT Cancellation Note  Patient Details Name: [name] [name] MRN: [id number] DOB: 1961/08/13   Cancelled Treatment:    Reason Eval/Treat Not Completed: Patient at procedure or test/ unavailable. Will follow.  Malka So 01/09/2017, 1:34 PM

## 2017-01-09 NOTE — Brief Op Note (Signed)
01/08/2017 - 01/09/2017  4:21 PM  PATIENT:  [name] [name]  56 y.o. female  PRE-OPERATIVE DIAGNOSIS:  Variceal bleed  POST-OPERATIVE DIAGNOSIS:  esophageal varices, 5 bands placed; retained food in stomach  PROCEDURE:  Procedure(s): ESOPHAGOGASTRODUODENOSCOPY (EGD) WITH PROPOFOL (N/A)  SURGEON:  Surgeon(s) and Role:    * Otis Brace, MD - Primary  Recommendations/findings - EGD showed very large esophageal varices with stigmata of recent bleeding. - Total of 5 bands placed.  - Large amount of food in the stomach limiting visualization. - Continue IV octreotide and antibiotics for total 72 hours. - Due to PPI for now. - By mouth today, clear liquid diet from tomorrow. - monitor H&H. GI will follow.

## 2017-01-09 NOTE — Care Management Note (Signed)
Case Management Note  Patient Details  Name: [name] [name] MRN: [id number] Date of Birth: 1961-03-31  Subjective/Objective:  Pt admitted on 01/08/17 with upper GI bleed with history of cirrhosis and hepatitis C.  PTA, pt independent of ADLS.                     Action/Plan: Will follow for discharge planning as pt progresses.  Upper endo/possible band ligation today.    Expected Discharge Date:                  Expected Discharge Plan:  Home/Self Care  In-House Referral:     Discharge planning Services  CM Consult  Post Acute Care Choice:    Choice offered to:     DME Arranged:    DME Agency:     HH Arranged:    HH Agency:     Status of Service:  In process, will continue to follow  If discussed at Long Length of Stay Meetings, dates discussed:    Additional Comments:  Ella Bodo, RN 01/09/2017, 4:01 PM

## 2017-01-09 NOTE — Anesthesia Preprocedure Evaluation (Signed)
Anesthesia Evaluation  Patient identified by MRN, date of birth, ID band Patient awake    Reviewed: Allergy & Precautions, NPO status , Patient's Chart, lab work & pertinent test results  Airway Mallampati: II  TM Distance: >3 FB Neck ROM: Full    Dental no notable dental hx.    Pulmonary neg pulmonary ROS, former smoker,    Pulmonary exam normal breath sounds clear to auscultation       Cardiovascular hypertension, negative cardio ROS Normal cardiovascular exam Rhythm:Regular Rate:Normal     Neuro/Psych negative neurological ROS  negative psych ROS   GI/Hepatic negative GI ROS, (+) Cirrhosis   Esophageal Varices    , Hepatitis -, CAlcoholic cirrhosis    Endo/Other  Hypothyroidism   Renal/GU negative Renal ROS  negative genitourinary   Musculoskeletal negative musculoskeletal ROS (+)   Abdominal   Peds negative pediatric ROS (+)  Hematology  (+) anemia ,   Anesthesia Other Findings   Reproductive/Obstetrics negative OB ROS                            Anesthesia Physical Anesthesia Plan  ASA: III  Anesthesia Plan: General   Post-op Pain Management:    Induction: Intravenous  Airway Management Planned: Oral ETT  Additional Equipment:   Intra-op Plan:   Post-operative Plan: Extubation in OR  Informed Consent: I have reviewed the patients History and Physical, chart, labs and discussed the procedure including the risks, benefits and alternatives for the proposed anesthesia with the patient or authorized representative who has indicated his/her understanding and acceptance.   Dental advisory given  Plan Discussed with: CRNA and Surgeon  Anesthesia Plan Comments:         Anesthesia Quick Evaluation

## 2017-01-09 NOTE — Progress Notes (Signed)
BP 80/49.  MD notified.  Verbal orders obtained to keep MAP about 60. Will continue to monitor closely.

## 2017-01-09 NOTE — Anesthesia Procedure Notes (Signed)
Procedure Name: Intubation Date/Time: 01/09/2017 2:36 PM Performed by: Oletta Lamas Pre-anesthesia Checklist: Patient identified, Emergency Drugs available, Suction available and Patient being monitored Patient Re-evaluated:Patient Re-evaluated prior to inductionOxygen Delivery Method: Circle System Utilized Preoxygenation: Pre-oxygenation with 100% oxygen Intubation Type: IV induction Ventilation: Mask ventilation without difficulty Laryngoscope Size: Mac and 3 Grade View: Grade I Tube type: Oral Number of attempts: 1 Airway Equipment and Method: Stylet Placement Confirmation: ETT inserted through vocal cords under direct vision,  positive ETCO2 and breath sounds checked- equal and bilateral Secured at: 22 cm Tube secured with: Tape Dental Injury: Teeth and Oropharynx as per pre-operative assessment

## 2017-01-09 NOTE — Transfer of Care (Addendum)
Immediate Anesthesia Transfer of Care Note  Patient: [name] [name]  Procedure(s) Performed: Procedure(s): ESOPHAGOGASTRODUODENOSCOPY (EGD) WITH PROPOFOL (N/A)  Patient Location: Endoscopy Unit  Anesthesia Type:General  Level of Consciousness: awake, alert  and oriented  Airway & Oxygen Therapy: Patient Spontanous Breathing and Patient connected to face mask oxygen  Post-op Assessment: Report given to RN, Post -op Vital signs reviewed and stable and Patient moving all extremities X 4  Post vital signs: Reviewed and stable  Last Vitals:  Vitals:   01/09/17 1234 01/09/17 1338  BP: 97/76 112/66  Pulse: (!) 109 (!) 103  Resp: (!) 22 (!) 24  Temp: 36.7 C 36.8 C    Last Pain:  Vitals:   01/09/17 1338  TempSrc: Oral  PainSc:       Patients Stated Pain Goal: 0 (46/80/32 1224)  Complications: No apparent anesthesia complications

## 2017-01-09 NOTE — Consult Note (Signed)
Referring Provider:  Dr. Danford Bad  Primary Care Physician:  Jacques Earthly, MD Primary Gastroenterologist:  Althia Forts followed by [facility] Dr. Orvis Brill. / Used to followed by Dr. Maurene Capes  Reason for Consultation:  Possible variceal bleed  HPI: [name] [name] is a 56 y.o. 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 According to patient, she was doing relatively okay until yesterday when she started having dry heaves followed by nonbloody vomiting. This was followed by vomiting of bright red blood. Patient subsequently started noticing dark stools. Also complaining of generalized abdominal discomfort.    EGD 2012 notes small esophageal varices. Not sure of previous colonoscopy. No family history of colon cancer. Hepatitis C genotype 1. Status post treatment with Samuella Cota and Ribavirin  in 2016, negative HCV PCR 02/2015  Past Medical History:  Diagnosis Date  . Adverse drug effect - [place] 03/07/2015  . Alcoholic cirrhosis ([place])   . Allergy   . Anxiety   . Arthritis   . cervical Cancer 1999  . Cholelithiasis   . COPD (chronic obstructive pulmonary disease) ([place])   . Decompensated hepatic cirrhosis ([place]) 03/07/2015   Side effect of [place]  . Depression   . Esophageal varices ([place])   . Esophagitis 2010  . Fatty liver   . Gastritis   . GERD (gastroesophageal reflux disease)   . Heart palpitations   . Hepatitis C    chronic hepatitis C and Steatohepatitis (hep grade 2, stage 2-3) per 05/13/08 liver biopsy  . Hypertension   . Obesity   . Portal hypertension ([place])   . Shortness of breath   . Splenomegaly   . Tubulovillous adenoma of colon   . Visual hallucination    since 09/2010/notes 10/30/2012    Past Surgical History:  Procedure Laterality Date  . ABDOMINAL HYSTERECTOMY  1999  . cervical cancer surgery    . ESOPHAGOGASTRODUODENOSCOPY  10/18/2011   Procedure:  ESOPHAGOGASTRODUODENOSCOPY (EGD);  Surgeon: Owens Loffler, MD;  Location: [facility];  Service: Endoscopy;  Laterality: N/A;  . TUBAL LIGATION  1982    Prior to Admission medications   Medication Sig Start Date End Date Taking? Authorizing Provider  albuterol (PROVENTIL HFA;VENTOLIN HFA) 108 (90 Base) MCG/ACT inhaler Inhale 2 puffs into the lungs every 6 (six) hours as needed for wheezing or shortness of breath. 12/12/16  Yes Milagros Loll, MD  albuterol (PROVENTIL) (2.5 MG/3ML) 0.083% nebulizer solution Take 3 mLs (2.5 mg total) by nebulization every 6 (six) hours as needed for wheezing or shortness of breath. 10/04/16  Yes Milagros Loll, MD  busPIRone (BUSPAR) 15 MG tablet Take 1-2 tablets (15-30 mg total) by mouth See admin instructions. 15 mg in the morning then 30 mg in the evening 10/10/16  Yes Norma Fredrickson, MD  doxepin (SINEQUAN) 25 MG capsule Take 25 mg by mouth at bedtime.   Yes Historical Provider, MD  furosemide (LASIX) 80 MG tablet Take 1 tablet (80 mg total) by mouth daily. 12/06/16  Yes Milagros Loll, MD  hydrOXYzine (ATARAX/VISTARIL) 25 MG tablet Take 1 tablet (25 mg total) by mouth 3 (three) times daily. 10/10/16  Yes Norma Fredrickson, MD  lactulose (CHRONULAC) 10 GM/15ML solution Take 30 mLs (20 g total) by mouth 3 (three) times daily. 12/25/16  Yes Milagros Loll, MD  levothyroxine (SYNTHROID, LEVOTHROID) 50 MCG tablet TAKE 1 TABLET (50 MCG TOTAL) BY MOUTH DAILY BEFORE BREAKFAST. 01/03/17  Yes Nischal Dareen Piano,  MD  LORazepam (ATIVAN) 1 MG tablet Take 1 tablet (1 mg total) by mouth 2 (two) times daily. 10/10/16  Yes Norma Fredrickson, MD  Melatonin 1 MG CAPS Take 1 mg by mouth at bedtime.   Yes Historical Provider, MD  meloxicam (MOBIC) 7.5 MG tablet Take 1 tablet (7.5 mg total) by mouth 2 (two) times daily as needed for pain. 12/05/16  Yes Milagros Loll, MD  mometasone-formoterol [facility]) 200-5 MCG/ACT AERO Inhale 2 puffs into the lungs 2 (two) times daily. 10/04/16  Yes  Milagros Loll, MD  pantoprazole (PROTONIX) 40 MG tablet Take 1 tablet (40 mg total) by mouth daily. 10/04/16  Yes Milagros Loll, MD  Potassium Chloride ER 20 MEQ TBCR Take 20 mEq by mouth 2 (two) times daily. 12/06/16  Yes Milagros Loll, MD  propranolol (INDERAL) 20 MG tablet Take 1 tablet (20 mg total) by mouth daily. 10/04/16  Yes Milagros Loll, MD  rifaximin (XIFAXAN) 550 MG TABS tablet Take 1 tablet (550 mg total) by mouth 2 (two) times daily. 10/05/16  Yes Maryellen Pile, MD  spironolactone (ALDACTONE) 100 MG tablet Take 2 tablets (200 mg total) by mouth daily. 12/02/16  Yes Maryellen Pile, MD  tiotropium (SPIRIVA HANDIHALER) 18 MCG inhalation capsule Place 1 capsule (18 mcg total) into inhaler and inhale daily. 10/04/16  Yes Milagros Loll, MD  traMADol (ULTRAM) 50 MG tablet Take 1 tablet (50 mg total) by mouth every 12 (twelve) hours as needed for severe pain. 12/25/16  Yes Milagros Loll, MD  VOLTAREN 1 % GEL APPLY 4 G TOPICALLY 4 (FOUR) TIMES DAILY. 09/05/16  Yes Milagros Loll, MD  Incontinence Supply Disposable (INCONTINENCE BRIEF LARGE) MISC Use as instructed. 12/06/16   Milagros Loll, MD    Scheduled Meds: . busPIRone  15 mg Oral Daily  . busPIRone  30 mg Oral QHS  . cefTRIAXone (ROCEPHIN)  IV  2 g Intravenous Q24H  . cetaphil   Topical BID  . chlorhexidine  15 mL Mouth Rinse BID  . diclofenac sodium  4 g Topical QID  . doxepin  25 mg Oral QHS  . hydrOXYzine  25 mg Oral TID  . lactulose  20 g Oral TID  . levothyroxine  50 mcg Oral QAC breakfast  . mometasone-formoterol  2 puff Inhalation BID  . pantoprazole (PROTONIX) IV  40 mg Intravenous Q12H  . rifaximin  550 mg Oral BID  . sodium chloride flush  3 mL Intravenous Q12H  . tiotropium  18 mcg Inhalation Daily   Continuous Infusions: . sodium chloride 20 mL/hr at 01/09/17 1240  . octreotide  (SANDOSTATIN)    IV infusion 50 mcg/hr (01/09/17 0600)   PRN Meds:.albuterol, camphor-menthol, magic mouthwash w/lidocaine,  meloxicam  Allergies as of 01/08/2017 - Review Complete 01/08/2017  Allergen Reaction Noted  . Ibuprofen Other (See Comments) 02/26/2016  . Tylenol [acetaminophen] Other (See Comments) 03/29/2015  . Amitriptyline Other (See Comments) 01/22/2013    Family History  Problem Relation Age of Onset  . Cervical cancer Sister   . Breast cancer Maternal Aunt   . Heart disease Father 68    died from MI  . Breast cancer Father   . Colon cancer Paternal Aunt 37  . Cervical cancer Daughter     Social History   Social History  . Marital status: Legally Separated    Spouse name: N/A  . Number of children: N/A  . Years of education: N/A   Occupational History  .  Not on file.   Social History Main Topics  . Smoking status: Former Smoker    Packs/day: 0.10    Years: 40.00    Types: Cigarettes    Quit date: 03/28/2012  . Smokeless tobacco: Never Used  . Alcohol use No     Comment: Former Scientist, clinical (histocompatibility and immunogenetics)  . Drug use: No  . Sexual activity: Not on file   Other Topics Concern  . Not on file   Social History Narrative  . No narrative on file    Review of Systems:  Review of Systems  Constitutional: Negative for chills and fever.  HENT: Negative for hearing loss and tinnitus.   Eyes: Negative for pain and discharge.  Respiratory: Positive for cough. Negative for hemoptysis.   Cardiovascular: Negative for chest pain and palpitations.  Gastrointestinal: Positive for blood in stool, melena, nausea and vomiting.  Genitourinary: Negative for dysuria and urgency.  Musculoskeletal: Positive for myalgias. Negative for falls and neck pain.  Skin: Negative for rash.  Neurological: Positive for weakness. Negative for speech change and seizures.  Endo/Heme/Allergies: Does not bruise/bleed easily.  Psychiatric/Behavioral: Negative for hallucinations and suicidal ideas.    Physical Exam: Vital signs: Vitals:   01/09/17 1126 01/09/17 1234  BP:  97/76  Pulse: (!) 101 (!) 109  Resp: 20 (!) 22   Temp:  98 F (36.7 C)   Last BM Date: 01/09/17   Physical Exam  Constitutional: She is oriented to person, place, and time and well-developed, well-nourished, and in no distress.  HENT:  Head: Normocephalic.  Mouth/Throat: Oropharynx is clear and moist.  Eyes: EOM are normal. Scleral icterus is present.  Neck: Normal range of motion. Neck supple.  Cardiovascular: Normal rate and regular rhythm.   Pulmonary/Chest: Effort normal and breath sounds normal. No respiratory distress. She has no wheezes.  Abdominal: Soft. Bowel sounds are normal. She exhibits distension. She exhibits no mass. There is no tenderness. There is no rebound and no guarding.  Generalized discomfort on bilateral upper quadrants.  Musculoskeletal: Normal range of motion.  Neurological: She is alert and oriented to person, place, and time.  Skin: Skin is dry. No pallor.  Psychiatric: Memory and affect normal.    GI:  Lab Results:  Recent Labs  01/08/17 1617 01/08/17 1639 01/08/17 2239 01/09/17 0308  WBC 20.2*  --   --  12.2*  HGB 10.4* 11.6* 8.9* 7.9*  HCT 31.2* 34.0* 26.3* 22.7*  PLT 169  --   --  103*   BMET  Recent Labs  01/08/17 1617 01/08/17 1639 01/08/17 2239 01/09/17 0308  NA 125* 127* 130* 128*  K 5.6* 5.6* 5.3* 5.3*  CL 96* 94* 102 104  CO2 19*  --  18* 18*  GLUCOSE 219* 214* 123* 150*  BUN 17 20 20  27*  CREATININE 1.00 0.90 0.86 0.91  CALCIUM 9.2  --  8.2* 8.4*   LFT  Recent Labs  01/09/17 0308  PROT 4.7*  ALBUMIN 2.0*  AST 44*  ALT 35  ALKPHOS 84  BILITOT 2.5*   PT/INR  Recent Labs  01/08/17 1657  LABPROT 17.4*  INR 1.42     Studies/Results: X-ray Chest Pa And Lateral  Result Date: 01/09/2017 CLINICAL DATA:  Ascites.  Vomiting.  Weakness. EXAM: CHEST  2 VIEW COMPARISON:  Chest radiograph from one day prior. FINDINGS: Stable cardiomediastinal silhouette with normal heart size and aortic atherosclerosis. No pneumothorax. No pleural effusion. No pulmonary edema.  Mild hazy left lung base opacity. IMPRESSION: 1. Mild hazy  left lung base opacity, favor atelectasis, cannot exclude a component of aspiration or pneumonia. 2. Aortic atherosclerosis. Electronically Signed   By: Ilona Sorrel M.D.   On: 01/09/2017 08:21   US Abdomen Complete  Result Date: 01/09/2017 CLINICAL DATA:  Abdominal pain for 1 year. Ascites. History of hepatitis-C cancer reduces. EXAM: ABDOMEN ULTRASOUND COMPLETE COMPARISON:  08/29/2016 FINDINGS: Gallbladder: Multiple stones are again seen in the gallbladder measuring up to 1.1 cm in size. Mild gallbladder wall thickening is unchanged, measuring up to 5 mm in thickness and nonspecific but which may be secondary to chronic liver disease and ascites. No sonographic Murphy sign noted by sonographer. Common bile duct: Diameter: 6 mm, upper limits of normal. Liver: Nodular contour with heterogeneous echotexture diffusely. No focal lesion identified. IVC: No abnormality visualized, although the infrahepatic portion was not seen. Pancreas: Not visualized. Spleen: Size and appearance within normal limits. Right Kidney: Length: 10.4 cm. Echogenicity within normal limits. No mass or hydronephrosis visualized. Left Kidney: Length: 12.1 cm. Suboptimally visualized without mass or hydronephrosis identified. Abdominal aorta: No aneurysm visualized proximally. Mid and distal abdominal aorta obscured by bowel gas. Other findings: Small volume ascites. IMPRESSION: 1. Cirrhosis without focal liver lesion identified. 2. Cholelithiasis with unchanged mild gallbladder wall thickening. 3. Small volume ascites. Electronically Signed   By: Logan Bores M.D.   On: 01/09/2017 09:17   Dg Chest Port 1 View  Result Date: 01/08/2017 CLINICAL DATA:  56 y/o F; shortness of breath. Multiple episodes of vomiting and/or hemoptysis. EXAM: PORTABLE CHEST 1 VIEW COMPARISON:  12/17/2016 chest radiograph. FINDINGS: Stable normal cardiac silhouette. Aortic atherosclerosis with  calcification. Clear lungs. No pleural effusion or pneumothorax. Degenerative changes of the thoracic spine. IMPRESSION: No active disease. Electronically Signed   By: Kristine Garbe M.D.   On: 01/08/2017 16:57    Impression/Plan: - Hematemesis and melena with  history of cirrhosis. Need to rule out variceal bleed. - Cirrhosis from chronic hepatitis C. Genotype 1A. S/P Rx with Samuella Cota and Ribavirin  in 2016  - Small esophageal varices based on 2012 endoscopy by Dr. Owens Loffler. - Acute blood loss anemia - COPD  Recommendations ---------------------------- - EGD today with possible band ligation. Risks benefits alternatives discussed with the patient. Verbalized understanding. - Continue octreotide, PPI and antibiotics. - Ultrasound showed cirrhosis and no liver lesion and only small volume of ascites. - Patient was advised to follow up with primary GI Dr.  Orvis Brill at [facility]  after discharge. Old records reviewed and summarized. - GI will follow.   LOS: 1 day   Otis Brace  MD, FACP 01/09/2017, 12:42 PM  Pager [phone] If no answer or after 5 PM call [phone]

## 2017-01-09 NOTE — Plan of Care (Signed)
Problem: Skin Integrity: Goal: Risk for impaired skin integrity will decrease Outcome: Progressing RN observed patient had placed herself on the bedpan and heard patient state "I'm just going to sleep with this thing under me".  RN educated patient that this is not a good idea because the plastic from the bedpan can cause skin breakdown and redness.  Patient stated understanding of the education saying "Oh, I didn't know that".  Patient encouraged to call for help whenever bedpan or BSC is needed.

## 2017-01-09 NOTE — Telephone Encounter (Signed)
Sent to dr 's hoffman, molt and rice as [name] [name]

## 2017-01-10 ENCOUNTER — Ambulatory Visit (HOSPITAL_COMMUNITY): Payer: Self-pay | Admitting: Psychiatry

## 2017-01-10 DIAGNOSIS — Z608 Other problems related to social environment: Secondary | ICD-10-CM

## 2017-01-10 LAB — CBC
HEMATOCRIT: 23.7 % — AB (ref 36.0–46.0)
HEMOGLOBIN: 8 g/dL — AB (ref 12.0–15.0)
MCH: 35.4 pg — ABNORMAL HIGH (ref 26.0–34.0)
MCHC: 33.8 g/dL (ref 30.0–36.0)
MCV: 104.9 fL — ABNORMAL HIGH (ref 78.0–100.0)
Platelets: 107 10*3/uL — ABNORMAL LOW (ref 150–400)
RBC: 2.26 MIL/uL — AB (ref 3.87–5.11)
RDW: 17.8 % — ABNORMAL HIGH (ref 11.5–15.5)
WBC: 11.3 10*3/uL — AB (ref 4.0–10.5)

## 2017-01-10 LAB — BASIC METABOLIC PANEL
ANION GAP: 9 (ref 5–15)
BUN: 27 mg/dL — ABNORMAL HIGH (ref 6–20)
CHLORIDE: 106 mmol/L (ref 101–111)
CO2: 20 mmol/L — AB (ref 22–32)
CREATININE: 0.97 mg/dL (ref 0.44–1.00)
Calcium: 8.7 mg/dL — ABNORMAL LOW (ref 8.9–10.3)
GFR calc non Af Amer: >60 mL/min (ref >60)
Glucose, Bld: 157 mg/dL — ABNORMAL HIGH (ref 65–99)
POTASSIUM: 3.9 mmol/L (ref 3.5–5.1)
Sodium: 135 mmol/L (ref 135–145)

## 2017-01-10 MED ORDER — DEXTROSE 5 % IV SOLN
2.0000 g | Freq: Once | INTRAVENOUS | Status: AC
  Administered 2017-01-10: 2 g via INTRAVENOUS
  Filled 2017-01-10: qty 2

## 2017-01-10 MED ORDER — PANTOPRAZOLE SODIUM 40 MG PO TBEC
40.0000 mg | DELAYED_RELEASE_TABLET | Freq: Two times a day (BID) | ORAL | Status: DC
  Administered 2017-01-10 – 2017-01-14 (×8): 40 mg via ORAL
  Filled 2017-01-10 (×8): qty 1

## 2017-01-10 NOTE — Care Management Note (Addendum)
Case Management Note  Patient Details  Name: [name] [name] MRN: [id number] Date of Birth: 09-11-1961  Subjective/Objective:  Pt admitted on 01/08/17 with upper GI bleed with history of cirrhosis and hepatitis C.  PTA, pt independent of ADLS. She is active with [facility] for [facility], she would like to continue this.  Will need resumption orders for [facility]. ( Medicaid does not cover HHPT unless patient has had a FX or CVA and she can not afford to pay private for HHPT).  Patient's medicaid covers medication, she has PCP, and she states her mother will be transporting her home at dc, also she states social services provides transportation for her MD apts, but she has to give the 3 day notice.  NCM will cont to follow for dc needs.                                Action/Plan:   Expected Discharge Date:                  Expected Discharge Plan:  Home/Self Care  In-House Referral:     Discharge planning Services  CM Consult  Post Acute Care Choice:  Resumption of Svcs/PTA Provider Choice offered to:  Patient  DME Arranged:    DME Agency:     HH Arranged:  RN [place] Agency:  [place]  Status of Service:  In process, will continue to follow  If discussed at Long Length of Stay Meetings, dates discussed:    Additional Comments:  Zenon Mayo, RN 01/10/2017, 11:07 AM

## 2017-01-10 NOTE — Progress Notes (Addendum)
[facility] Progress Note  [name] [name] 56 y.o. 04-30-1961  CC:  Variceal bleed   Subjective: EGD yesterday showed very large esophageal varices with stigmata of recent bleeding. She underwent 5 bands placement. No bowel movement overnight or this morning. Patient is feeling better. Denied nausea, vomiting or abdominal pain.  ROS : Negative for nausea, vomiting or abdominal pain.   Objective: Vital signs in last 24 hours: Vitals:   01/10/17 0608 01/10/17 0810  BP: 139/89 (!) 131/94  Pulse: (!) 101 77  Resp: (!) 21 15  Temp:  98.2 F (36.8 C)    Physical Exam:  Constitutional: She is oriented to person, place, and time and well-developed, well-nourished, and in no distress.  HENT:  Head: Normocephalic.  Mouth/Throat: Oropharynx is clear and moist.  Eyes: EOM are normal. Scleral icterus is present.  Neck: Normal range of motion. Neck supple.  Cardiovascular: Normal rate and regular rhythm.   Pulmonary/Chest: Effort normal and breath sounds normal. No respiratory distress. She has no wheezes.  Abdominal: Soft. Bowel sounds are normal. Nondistended. She exhibits no mass. There is no tenderness. There is no rebound and no guarding.  Psychiatric: Memory and affect normal.    Lab Results:  Recent Labs  01/08/17 2239 01/09/17 0308  NA 130* 128*  K 5.3* 5.3*  CL 102 104  CO2 18* 18*  GLUCOSE 123* 150*  BUN 20 27*  CREATININE 0.86 0.91  CALCIUM 8.2* 8.4*    Recent Labs  01/08/17 1617 01/09/17 0308  AST 50* 44*  ALT 38 35  ALKPHOS 146* 84  BILITOT 3.3* 2.5*  PROT 6.4* 4.7*  ALBUMIN 2.6* 2.0*    Recent Labs  01/08/17 1617  01/09/17 0308 01/09/17 1245 01/10/17 0430  WBC 20.2*  --  12.2*  --  11.3*  NEUTROABS 15.6*  --   --   --   --   HGB 10.4*  < > 7.9* 9.5* 8.0*  HCT 31.2*  < > 22.7* 27.7* 23.7*  MCV 102.3*  --  102.3*  --  104.9*  PLT 169  --  103*  --  107*  < > = values in this interval not displayed.  Recent Labs  01/08/17 1657   LABPROT 17.4*  INR 1.42      Assessment/Plan: - Esophageal variceal bleed. EGD showed very large esophageal varices with stigmata of recent bleeding. Status post 5 bands placement. 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 S/P Rx with Samuella Cota and Ribavirin  in 2016  - History of hepatic encephalopathy - Thrombocytopenia - Normal LFTs. From underlying decompensated cirrhosis - Acute blood loss anemia - COPD  Recommendations ---------------------------- - Continue octreotide, PPI and antibiotics for total 72 hours. - D/C NSAIDs.  - Repeat CBC this afternoon as well as tomorrow morning. - Ultrasound showed cirrhosis and no liver lesion and only small volume of ascites. - Advance diet to full liquid, if tolerated - advance diet to low sodium. - Patient was advised to follow up with primary GI Dr.  Orvis Brill at [facility]  after discharge.  - GI will follow.   Otis Brace MD, FACP 01/10/2017, 8:53 AM  Pager [phone]  If no answer or after 5 PM call [phone]

## 2017-01-10 NOTE — Evaluation (Signed)
Occupational Therapy Evaluation Patient Details Name: [name] [name] MRN: [id number] DOB: 23-Apr-1961 Today's Date: 01/10/2017    History of Present Illness Pt is a 56 y/o female admitted from home secondary to hematemesis and SOB with an upper GI bleed and history of esophageal varices. PMH including but not limited to Hep C, alcoholic liver cirrhosis, COPD, HTN and cervical cancer.   Clinical Impression   Pt was able to perform self care and ambulation modified independently prior to admission. Her family helped with IADL. Pt presents slow processing and decreased standing balance requiring supervision to min guard assist for ADL and ADL transfers. Will follow acutely.    Follow Up Recommendations  No OT follow up    Equipment Recommendations  None recommended by OT    Recommendations for Other Services       Precautions / Restrictions Precautions Precautions: Fall Restrictions Weight Bearing Restrictions: No      Mobility Bed Mobility   General bed mobility comments: pt in chair  Transfers Overall transfer level: Needs assistance Equipment used: Rolling walker (2 wheeled) Transfers: Sit to/from Stand Sit to Stand: Supervision Stand pivot transfers: Min guard       General transfer comment: ambulated in room with min guard assist and no device, pt reaching for furniture, but no LOB, pt plans to use her walker at home    Balance Overall balance assessment: Needs assistance Sitting-balance support: Feet supported Sitting balance-Leahy Scale: Good     Standing balance support: During functional activity;Bilateral upper extremity supported Standing balance-Leahy Scale: Poor Standing balance comment: able to walk without RW, but safer with it                            ADL Overall ADL's : Needs assistance/impaired Eating/Feeding: Independent;Sitting   Grooming: Wash/dry hands;Standing;Supervision/safety   Upper Body Bathing: Set up;Sitting   Lower  Body Bathing: Supervison/ safety;Sit to/from stand   Upper Body Dressing : Set up;Sitting   Lower Body Dressing: Supervision/safety;Sit to/from stand   Toilet Transfer: Min guard;Ambulation   Toileting- Clothing Manipulation and Hygiene: Supervision/safety;Sit to/from stand       Functional mobility during ADLs: Min guard General ADL Comments: Pt bends over to reach her feet for bathing and dressing. Reports her shower seat is ill-fitting in her tub.     Vision Patient Visual Report: No change from baseline       Perception     Praxis      Pertinent Vitals/Pain Pain Assessment: No/denies pain     Hand Dominance Right   Extremity/Trunk Assessment Upper Extremity Assessment Upper Extremity Assessment: Overall WFL for tasks assessed   Lower Extremity Assessment Lower Extremity Assessment: Defer to PT evaluation       Communication Communication Communication: No difficulties   Cognition Arousal/Alertness: Awake/alert Behavior During Therapy: WFL for tasks assessed/performed Overall Cognitive Status: Impaired/Different from baseline Area of Impairment: Problem solving             Problem Solving: Slow processing;Requires tactile cues;Difficulty sequencing     General Comments       Exercises       Shoulder Instructions      Home Living Family/patient expects to be discharged to:: Private residence Living Arrangements: Alone Available Help at Discharge: Family;Available PRN/intermittently Type of Home: Apartment Home Access: Stairs to enter Entrance Stairs-Number of Steps: 2 Entrance Stairs-Rails: None Home Layout: One level     Bathroom Shower/Tub: Tub/shower unit   [employer]  Toilet: Standard     Home Equipment: Walker - 4 wheels;Cane - single point;Shower seat;Grab bars - tub/shower          Prior Functioning/Environment Level of Independence: Needs assistance  Gait / Transfers Assistance Needed: pt reported that she did not use an AD  to ambulate PTA ADL's / Homemaking Assistance Needed: family assists with IADL, pt able to perform self care modified independently            OT Problem List: Impaired balance (sitting and/or standing)      OT Treatment/Interventions: Self-care/ADL training;DME and/or AE instruction;Patient/family education;Balance training    OT Goals(Current goals can be found in the care plan section) Acute Rehab OT Goals Patient Stated Goal: to go home tomorrow OT Goal Formulation: With patient Time For Goal Achievement: 01/24/17 Potential to Achieve Goals: Good ADL Goals Pt Will Perform Grooming: with modified independence;standing Pt Will Perform Lower Body Bathing: with modified independence;sit to/from stand Pt Will Perform Lower Body Dressing: with modified independence;sit to/from stand Pt Will Transfer to Toilet: with modified independence;ambulating;regular height toilet Pt Will Perform Toileting - Clothing Manipulation and hygiene: with modified independence;sit to/from stand Pt Will Perform Tub/Shower Transfer: Tub transfer;ambulating;with supervision;shower seat;rolling walker  OT Frequency: Min 2X/week   Barriers to D/C:            Co-evaluation              End of Session Equipment Utilized During Treatment: Gait belt Nurse Communication: Mobility status  Activity Tolerance: Patient tolerated treatment well Patient left: in chair;with call bell/phone within reach;with nursing/sitter in room;with chair alarm set  OT Visit Diagnosis: Unsteadiness on feet (R26.81);Other abnormalities of gait and mobility (R26.89)                ADL either performed or assessed with clinical judgement  Time: 1256-1318 OT Time Calculation (min): 22 min Charges:  OT General Charges $OT Visit: 1 Procedure OT Evaluation $OT Eval Moderate Complexity: 1 Procedure G-Codes:      Malka So 01/10/2017, 1:27 PM  [phone]

## 2017-01-10 NOTE — Progress Notes (Signed)
Physical Therapy Treatment Patient Details Name: [name] [name] MRN: [id number] DOB: 1961/02/01 Today's Date: 01/10/2017    History of Present Illness Pt is a 56 y/o female admitted from home secondary to hematemesis and SOB with an upper GI bleed and history of esophageal varices. PMH including but not limited to Hep C, alcoholic liver cirrhosis, COPD, HTN and cervical cancer.    PT Comments    Pt presented supine in bed with HOB elevated, awake and willing to participate in therapy session. Pt requesting to transfer to [facility] at beginning of session with use of RW and min guard. Pt more steady on her feet this session but still requiring RW for safety. All VSS throughout. Pt would continue to benefit from skilled physical therapy services at this time while admitted and after d/c to address her limitations in order to improve her overall safety and independence with functional mobility.     Follow Up Recommendations  Home health PT     Equipment Recommendations  None recommended by PT    Recommendations for Other Services       Precautions / Restrictions Precautions Precautions: Fall Restrictions Weight Bearing Restrictions: No    Mobility  Bed Mobility Overal bed mobility: Needs Assistance Bed Mobility: Supine to Sit     Supine to sit: Min guard;HOB elevated     General bed mobility comments: increased time, use of bed rails, min guard for safety  Transfers Overall transfer level: Needs assistance Equipment used: Rolling walker (2 wheeled) Transfers: Sit to/from [facility] Sit to Stand: Min guard Stand pivot transfers: Min guard       General transfer comment: increased time, VC'ing for bilateral hand placement and min guard for safety. Pt performed sit-to-stand x1 from bed x1 from [facility] and x1 from recliner chair.   Ambulation/Gait Ambulation/Gait assistance: Min guard Ambulation Distance (Feet): 100 Feet Assistive device: Rolling walker (2  wheeled) Gait Pattern/deviations: Step-through pattern;Decreased stride length;Trunk flexed Gait velocity: decreased, able to regulate speed somewhat with verbal cueing to increase stride length Gait velocity interpretation: Below normal speed for age/gender General Gait Details: modest instability with ambulation; however, no LOB or need for physical assistance, min guard for safety.    Stairs            Wheelchair Mobility    Modified Rankin (Stroke Patients Only)       Balance Overall balance assessment: Needs assistance Sitting-balance support: Feet supported Sitting balance-Leahy Scale: Good     Standing balance support: During functional activity;Bilateral upper extremity supported Standing balance-Leahy Scale: Poor Standing balance comment: pt reliant on bilateral UEs on RW                    Cognition Arousal/Alertness: Awake/alert Behavior During Therapy: WFL for tasks assessed/performed Overall Cognitive Status: Within Functional Limits for tasks assessed                      Exercises      General Comments        Pertinent Vitals/Pain Pain Assessment: No/denies pain    Home Living                      Prior Function            PT Goals (current goals can now be found in the care plan section) Acute Rehab PT Goals PT Goal Formulation: With patient Time For Goal Achievement: 01/23/17 Potential to Achieve Goals: Good  Progress towards PT goals: Progressing toward goals    Frequency    Min 3X/week      PT Plan Current plan remains appropriate    Co-evaluation             End of Session Equipment Utilized During Treatment: Gait belt Activity Tolerance: Patient tolerated treatment well Patient left: in chair;with call bell/phone within reach;with chair alarm set Nurse Communication: Mobility status PT Visit Diagnosis: Other abnormalities of gait and mobility (R26.89);Unsteadiness on feet (R26.81)      Time: 3557-3220 PT Time Calculation (min) (ACUTE ONLY): 36 min  Charges:  $Gait Training: 8-22 mins $Therapeutic Activity: 8-22 mins                    G CodesClearnce Sorrel Beryl Balz 01/19/17, 12:02 PM Sherie Don, Gray, DPT [phone]

## 2017-01-10 NOTE — Care Management Note (Signed)
Case Management Note  Patient Details  Name: Nyelli Samara MRN: [id number] Date of Birth: [birth year]  Subjective/Objective:                    Action/Plan:   Expected Discharge Date:                  Expected Discharge Plan:  [place]  In-House Referral:     Discharge planning Services  CM Consult  Post Acute Care Choice:  Resumption of Svcs/PTA Provider, Home Health Choice offered to:  Patient  DME Arranged:    DME Agency:     HH Arranged:  RN [employer] Agency:  [place]  Status of Service:  Completed, signed off  If discussed at [place] of Stay Meetings, dates discussed:    Additional Comments:  Marilu Favre, RN 01/10/2017, 1:42 PM

## 2017-01-10 NOTE — Progress Notes (Signed)
   Subjective: [name] [name] was seen and evaluated today at bedside. Feels better since admission. Denies any more bloody bowel movements or hematemesis since admission. EGD today with banding of her large recently bleed esophageal varices.    Objective:  Vital signs in last 24 hours: Vitals:   01/10/17 0200 01/10/17 0358 01/10/17 0400 01/10/17 0608  BP: 106/63  121/75 139/89  Pulse: 87  99 (!) 101  Resp: 20  18 (!) 21  Temp:   98.4 F (36.9 C)   TempSrc:   Oral   SpO2: 92%  93% 92%  Weight:  168 lb 6.9 oz (76.4 kg)    Height:       General: Chronically-ill appearing caucasian female resting comfortably in bed. In no acute distress. HENT: EOMI. No conjunctival injection or ptosis. Scleral icterus appreciated.  Cardiovascular: Regular rate and rhythm. No murmur or rub appreciated. Pulmonary: CTA BL, no wheezing. Unlabored breathing.  Abdomen: Soft, non-tender and non-distended. No guarding or rigidity. +bowel sounds.  Psych: Mood normal and affect was mood congruent. Responds to questions appropriately.  Assessment/Plan:  Principal Problem:   Gastrointestinal hemorrhage with hematemesis Active Problems:   History of hepatitis C   Anxiety   Essential hypertension, benign   Esophageal varices in cirrhosis (HCC)   Hepatic encephalopathy (HCC)   Cirrhosis (HCC)   Hypothyroidism   Hyperkalemia   GI bleed  Gastrointestinal Hemorrhage with Hematemesis, Upper GI, +FOBT: s/p EGD with banding of her large esophageal varices. These appeared to have recently bled and she had large volume BRB in stomach. Reports she's had a bloody bowel movement today however denies any abdominal pain, nausea or vomiting. She was reassured this was likely residual blood in GI tract from prior bleeding which should resolve in a few days. Hb 8 today.  -GI on board and recommend continuing Octreotide, PPI and abx x 72 hrs. Will give 1 final dose of Ceftriaxone today and d/c after. -Change IV Protonix -->  PO -Propranolol 20 mg. Could consider increasing if HR tolerates -Monitor CBC. Will be checked BID. Transfuse if <8.  -Advance diet to liquids -Avoid antiplatelets and anticoags.  -Continue to appreciate recs from GI.  -Leukocytosis improved at 11.3 today, likely from acute upper GI bleed. Doubt infection  Alcohol-Associated Cirrhosis, Hepatitis C (SVR12 in 2016), Hx of Elevated AFP, Thrombocytopenia: Started on prophylactic IV ceftriaxone on admission. US showed only minimal ascites. Her abdominal exam appears benign and without distention nor tenderness. She has been evaluated by [facility] for transplant in early 2017 at which time her MELD score was 17.  She is considered to be a poor transplant candidate due to poor social support and also underlying psychiatric dx.  -AFP 09/2011 13.6 --> 54.6 (03/2012)   -Continue to monitor abdominal exams -She reports she will follow with her transplant physician, Dr. Patsy [name], at [facility]   COPD: Lungs clear and remains saturating well. Will continue home Spiriva, Dulera and Albuterol.   Hypothyroidism: Currently on synthroid 50 mcg daily  Hyperkalemia, Hyponatremia: 5.6 on admission, repeat this morning 5.3 after calcium gluconate and kayexalate. EKG with questionable peaked t-waves. BMET pending.  -F/u bmet  Dispo: Anticipated discharge tomorrow.   [name] Cardosa, DO 01/10/2017, 7:25 AM Pager: [phone]

## 2017-01-10 NOTE — Progress Notes (Signed)
Admitted patient to 6N20. Patient is alert and oriented, not in any distress. VSS . Will continue to monitor.

## 2017-01-11 DIAGNOSIS — Z9889 Other specified postprocedural states: Secondary | ICD-10-CM

## 2017-01-11 DIAGNOSIS — Z8619 Personal history of other infectious and parasitic diseases: Secondary | ICD-10-CM

## 2017-01-11 LAB — CBC
HCT: 24.1 % — ABNORMAL LOW (ref 36.0–46.0)
Hemoglobin: 8 g/dL — ABNORMAL LOW (ref 12.0–15.0)
MCH: 35.1 pg — AB (ref 26.0–34.0)
MCHC: 33.2 g/dL (ref 30.0–36.0)
MCV: 105.7 fL — AB (ref 78.0–100.0)
Platelets: 87 10*3/uL — ABNORMAL LOW (ref 150–400)
RBC: 2.28 MIL/uL — AB (ref 3.87–5.11)
RDW: 17.8 % — ABNORMAL HIGH (ref 11.5–15.5)
WBC: 10.2 10*3/uL (ref 4.0–10.5)

## 2017-01-11 MED ORDER — DEXTROSE 5 % IV SOLN
2.0000 g | Freq: Once | INTRAVENOUS | Status: AC
  Administered 2017-01-11: 2 g via INTRAVENOUS
  Filled 2017-01-11 (×2): qty 2

## 2017-01-11 MED ORDER — TRAMADOL HCL 50 MG PO TABS
50.0000 mg | ORAL_TABLET | Freq: Two times a day (BID) | ORAL | Status: DC | PRN
  Administered 2017-01-11 – 2017-01-14 (×5): 50 mg via ORAL
  Filled 2017-01-11 (×5): qty 1

## 2017-01-11 MED ORDER — ACETAMINOPHEN 325 MG PO TABS: 650.0000 mg | ORAL_TABLET | Freq: Three times a day (TID) | ORAL | Status: DC | PRN

## 2017-01-11 NOTE — Progress Notes (Signed)
   Subjective: [name] [name] was seen and evaluated today at bedside. Continues to feel well. Had large bowel movement last night with old blood per patient. Denies any abdominal pain, nausea or vomiting. No more hematemesis.   Objective:  Vital signs in last 24 hours: Vitals:   01/10/17 1230 01/10/17 1316 01/10/17 2126 01/11/17 0428  BP: 117/84 127/85 118/72 136/85  Pulse: (!) 104 96 95 (!) 107  Resp: 18 18 17 17   Temp: 98.2 F (36.8 C) 98.4 F (36.9 C) 98.1 F (36.7 C) 98.7 F (37.1 C)  TempSrc: Oral Oral Oral Oral  SpO2: 91%  94% 94%  Weight:      Height:       General: Chronically-ill appearing caucasian female resting comfortably in bed. In no acute distress. HENT: EOMI. No conjunctival injection or ptosis. Scleral icterus appreciated.  Cardiovascular: Regular rate and rhythm. No murmur or rub appreciated. Pulmonary: CTA BL, no wheezing. Unlabored breathing.  Abdomen: Soft, non-tender and non-distended. No guarding or rigidity. +bowel sounds.  Psych: Mood normal and affect was mood congruent. Responds to questions appropriately.  Assessment/Plan:  Principal Problem:   Gastrointestinal hemorrhage with hematemesis Active Problems:   History of hepatitis C   Anxiety   Essential hypertension, benign   Esophageal varices in cirrhosis (HCC)   Hepatic encephalopathy (HCC)   Cirrhosis (HCC)   Hypothyroidism   Hyperkalemia   GI bleed  Gastrointestinal Hemorrhage with Hematemesis, Upper GI, +FOBT: s/p EGD with banding of her large esophageal varices. These appeared to have recently bled and she had large volume BRB in stomach. Bowel movement overnight with old blood per patient. She denies any further hematemesis or bright red blood per rectum. GI on board and we appreciate their recommendations. They rec keeping patient another day and continuing Abx another day to ensure stability 72hrs following procedure. She [place] currently tolerating diet without issue.  -Vital signs  stable -1 more dose of IV Ceftriaxone -IV Octreotide per GI, will likely d/c tomrrow -Hb stable today at 8.0! -Will repeat CBC tomorrow AM -Po protonix  -Continue diet  Alcohol-Associated Cirrhosis, Hepatitis C (SVR12 in 2016), Hx of Elevated AFP, Thrombocytopenia: Started on prophylactic IV ceftriaxone on admission. US showed only minimal ascites. Her abdominal exam appears benign and without distention nor tenderness. She has been evaluated by [facility] for transplant in early 2017 at which time her MELD score was 17.  She is considered to be a poor transplant candidate.  -AFP 09/2011 13.6 --> 54.6 (03/2012)   -Continue to monitor abdominal exams -She reports she will follow Dr. Patsy Baltimore, at [facility] upon d/c  COPD: Lungs clear and remains saturating well. Will continue home Spiriva, Dulera and Albuterol.   Hypothyroidism: Was on synthroid 50 mcg however this was d/c overnight by her PCP, Dr. Randell Patient.    Dispo: Anticipated discharge tomorrow.   Lima Chillemi, DO 01/11/2017, 11:22 AM Pager: [phone]

## 2017-01-11 NOTE — Progress Notes (Signed)
[facility] Gastroenterology Progress Note  [name] [name] 56 y.o. [birth year]  CC:  Variceal bleed   Subjective:  Patient feeling better this morning. Bowel movement last night showed some old blood according to patient. Discussed with nursing staff. No evidence of bleeding this morning. Patient is tolerating full liquid diet. Patient is complaining of weakness  ROS : Negative for nausea, vomiting or abdominal pain.   Objective: Vital signs in last 24 hours: Vitals:   01/10/17 2126 01/11/17 0428  BP: 118/72 136/85  Pulse: 95 (!) 107  Resp: 17 17  Temp: 98.1 F (36.7 C) 98.7 F (37.1 C)    Physical Exam:  Constitutional: She is oriented to person, place, and time and well-developed, well-nourished, and in no distress.  HENT:  Head: Normocephalic.  Mouth/Throat: Oropharynx is clear and moist.  Eyes: EOM are normal. Scleral icterus is present.  Neck: Normal range of motion. Neck supple.  Cardiovascular: Normal rate and regular rhythm.   Pulmonary/Chest: Effort normal and breath sounds normal. No respiratory distress. She has no wheezes.  Abdominal: Soft. Bowel sounds are normal. Nondistended. She exhibits no mass. There is no tenderness. There is no rebound and no guarding.  Psychiatric: Memory and affect normal.    Lab Results:  Recent Labs  01/09/17 0308 01/10/17 2201  NA 128* 135  K 5.3* 3.9  CL 104 106  CO2 18* 20*  GLUCOSE 150* 157*  BUN 27* 27*  CREATININE 0.91 0.97  CALCIUM 8.4* 8.7*    Recent Labs  01/08/17 1617 01/09/17 0308  AST 50* 44*  ALT 38 35  ALKPHOS 146* 84  BILITOT 3.3* 2.5*  PROT 6.4* 4.7*  ALBUMIN 2.6* 2.0*    Recent Labs  01/08/17 1617  01/10/17 0430 01/11/17 0543  WBC 20.2*  < > 11.3* 10.2  NEUTROABS 15.6*  --   --   --   HGB 10.4*  < > 8.0* 8.0*  HCT 31.2*  < > 23.7* 24.1*  MCV 102.3*  < > 104.9* 105.7*  PLT 169  < > 107* 87*  < > = values in this interval not displayed.  Recent Labs  01/08/17 1657  LABPROT 17.4*  INR 1.42       Assessment/Plan: - Esophageal variceal bleed. EGD 01/09/2017 showed very large esophageal varices with stigmata of recent bleeding. Status post 5 bands placement. 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 S/P Rx with Samuella Cota and Ribavirin  in 2016  - History of hepatic encephalopathy - Thrombocytopenia - Normal LFTs. From underlying decompensated cirrhosis - Acute blood loss anemia - COPD  Recommendations ---------------------------- - Continue octreotide today. May be able to discontinue tomorrow if hemoglobin remains stable. - continue  antibiotics while in the hospital. Continue PPI - Although patient would benefit from nonselective beta blocker, I would avoid it for now given history of COPD and noncompliance. - Advance diet to GI soft/ low-sodium diet  - Patient was advised to follow up with primary GI Dr.  Orvis Brill at [name]  after discharge.  - GI will follow.   Otis Brace MD, FACP 01/11/2017, 9:13 AM  Pager [phone]  If no answer or after 5 PM call [phone]

## 2017-01-12 LAB — CBC
HEMATOCRIT: 22.3 % — AB (ref 36.0–46.0)
Hemoglobin: 7.3 g/dL — ABNORMAL LOW (ref 12.0–15.0)
MCH: 35.3 pg — ABNORMAL HIGH (ref 26.0–34.0)
MCHC: 32.7 g/dL (ref 30.0–36.0)
MCV: 107.7 fL — ABNORMAL HIGH (ref 78.0–100.0)
Platelets: 74 10*3/uL — ABNORMAL LOW (ref 150–400)
RBC: 2.07 MIL/uL — AB (ref 3.87–5.11)
RDW: 18 % — ABNORMAL HIGH (ref 11.5–15.5)
WBC: 9.2 10*3/uL (ref 4.0–10.5)

## 2017-01-12 LAB — BPAM RBC
Blood Product Expiration Date: 201803212359
Blood Product Expiration Date: 201804072359
Unit Type and Rh: 5100
Unit Type and Rh: 5100

## 2017-01-12 LAB — TYPE AND SCREEN
ABO/RH(D): O POS
ANTIBODY SCREEN: NEGATIVE
UNIT DIVISION: 0
Unit division: 0

## 2017-01-12 MED ORDER — LORAZEPAM 1 MG PO TABS
1.0000 mg | ORAL_TABLET | Freq: Two times a day (BID) | ORAL | Status: DC | PRN
  Administered 2017-01-12 – 2017-01-14 (×5): 1 mg via ORAL
  Filled 2017-01-12 (×5): qty 1

## 2017-01-12 NOTE — Progress Notes (Signed)
Occupational Therapy Treatment Patient Details Name: [name] [name] MRN: [id number] DOB: 05-13-1961 Today's Date: 01/12/2017    History of present illness Pt is a 56 y/o female admitted from home secondary to hematemesis and SOB with an upper GI bleed and history of esophageal varices. PMH including but not limited to Hep C, alcoholic liver cirrhosis, COPD, HTN and cervical cancer.   OT comments  Pt progressing towards acute OT goals. Focus of session was working on activity tolerance during ADLs. Ambulated household distance mostly at supervision level, min guard as she fatigued towards end of session.   Follow Up Recommendations  No OT follow up    Equipment Recommendations  None recommended by OT    Recommendations for Other Services      Precautions / Restrictions Precautions Precautions: Fall Restrictions Weight Bearing Restrictions: No       Mobility Bed Mobility Overal bed mobility: Needs Assistance Bed Mobility: Supine to Sit     Supine to sit: Min guard;HOB elevated        Transfers Overall transfer level: Needs assistance Equipment used: None (pushed IV pole) Transfers: Sit to/from Stand Sit to Stand: Supervision Stand pivot transfers: Supervision;Min guard            Balance           Standing balance support: Single extremity supported;During functional activity Standing balance-Leahy Scale: Poor Standing balance comment: single extremity support utilized                   ADL Overall ADL's : Needs assistance/impaired                         Toilet Transfer: Supervision/safety;Ambulation Toilet Transfer Details (indicate cue type and reason): simulated with transfer to chair. single extremity support         Functional mobility during ADLs: Supervision/safety;Min guard;Rolling walker General ADL Comments: Pt using IV pole for steadying assist to walk household distance in the hallway. Supervision to min guard as pt  fatigued towards end of ambulation.       Vision                     Perception     Praxis      Cognition   Behavior During Therapy: [facility] for tasks assessed/performed Overall Cognitive Status: No family/caregiver present to determine baseline cognitive functioning Area of Impairment: Problem solving              Problem Solving: Slow processing;Difficulty sequencing        Exercises     Shoulder Instructions       General Comments      Pertinent Vitals/ Pain       Pain Assessment: Faces Faces Pain Scale: Hurts little more Pain Location: back/right hip area towards end of walking in hall Pain Descriptors / Indicators: Discomfort;Guarding Pain Intervention(s): Limited activity within patient's tolerance;Monitored during session;Repositioned  Home Living                                          Prior Functioning/Environment              Frequency  Min 2X/week        Progress Toward Goals  OT Goals(current goals can now be found in the care plan section)  Progress towards OT goals: Progressing toward  goals  Acute Rehab OT Goals Patient Stated Goal: to go home tomorrow OT Goal Formulation: With patient Time For Goal Achievement: 01/24/17 Potential to Achieve Goals: Good ADL Goals Pt Will Perform Grooming: with modified independence;standing Pt Will Perform Lower Body Bathing: with modified independence;sit to/from stand Pt Will Perform Lower Body Dressing: with modified independence;sit to/from stand Pt Will Transfer to Toilet: with modified independence;ambulating;regular height toilet Pt Will Perform Toileting - Clothing Manipulation and hygiene: with modified independence;sit to/from stand Pt Will Perform Tub/Shower Transfer: Tub transfer;ambulating;with supervision;shower seat;rolling walker  Plan Discharge plan remains appropriate    Co-evaluation                 End of Session Equipment Utilized During  Treatment: Gait belt  OT Visit Diagnosis: Unsteadiness on feet (R26.81);Other abnormalities of gait and mobility (R26.89)   Activity Tolerance Patient tolerated treatment well   Patient Left in chair;with call bell/phone within reach   Nurse Communication Mobility status        Time: 6237-6283 OT Time Calculation (min): 16 min  Charges: OT General Charges $OT Visit: 1 Procedure OT Treatments $Self Care/Home Management : 8-22 mins     Hortencia Pilar 01/12/2017, 4:50 PM

## 2017-01-12 NOTE — Progress Notes (Signed)
   Subjective: [name] [name] feels well today and states her bowel movements are becoming more brown than black now. However, morning labs suggest decreased hemoglobin and she remains tachycardic so we will continue observation with repeat lab studies while monitoring clinically for bleeding.  Objective:  Vital signs in last 24 hours: Vitals:   01/11/17 2016 01/11/17 2134 01/12/17 0634 01/12/17 1349  BP:  108/69 112/71 130/80  Pulse:  (!) 115 (!) 110 (!) 119  Resp:  18 17 18   Temp:  98.6 F (37 C) 98.5 F (36.9 C) 98.1 F (36.7 C)  TempSrc:  Oral Oral Oral  SpO2: 96% 94% 95% 96%  Weight:   166 lb 0.1 oz (75.3 kg)   Height:       General: Chronically-ill appearing caucasian female sitting in bedside chair. In no acute distress. HENT: EOMI. Scleral icterus appreciated.  Cardiovascular: Mild tachycardia, regular rhythm. No murmur or rub appreciated. Extremities: 2+ pitting edema in bilateral lower extremities below knees Pulmonary: CTA BL, no wheezing. Unlabored breathing.  Abdomen: Soft, non-tender and non-distended. +bowel sounds throughout.  Psych: Good mood and very pleasant conversation this morning. She states she feels very clear minded today.  Assessment/Plan:  #Hemorrhage with Hematemesis from esophageal varices: s/p EGD with banding of her large esophageal varices. These appeared to have recently bled and she had large volume BRB in stomach. Bowel movement overnight with old blood per patient it is clearing up somewhat today. Drop in Hgb overnight is concerning despite no clinical report of blood loss. We will continue to observe today. -Continuing octreotide today -Hb decreased 8.0 to 7.3 -Will repeat CBC tomorrow AM -PO protonix BID  -Continue diet, NPO @[place]  in case repeat GI procedures needed with clinical bleeding or decreasing Hgb  #Alcohol-Associated Cirrhosis #Hepatitis C (SVR12 in 2016) #Hx of Elevated AFP #Thrombocytopenia: Started on prophylactic IV ceftriaxone  on admission. US showed only minimal ascites and there is no clinical evidence for peritonitis at this time. She has been evaluated by [facility] for transplant in early 2017 at which time her MELD score was 17.  She is considered to be a poor transplant candidate due to some nonmedical factors. She has abstained from alcohol for years but does continue to smoke cigarettes recently.  -AFP 09/2011 13.6 --> 54.6 (03/2012)   -Continue to monitor abdominal exams -She reports she will follow Dr. Patsy [place], at [facility] upon d/c  COPD: Not in acute exacerbation. Will continue home Spiriva, Dulera and Albuterol.   Dispo: Anticipate discharge tomorrow if no evidence of blood loss, versus additional GI workup   Collier Salina, MD PGY-II Internal Medicine Resident Pager# [phone] 01/12/2017, 2:15 PM

## 2017-01-12 NOTE — Progress Notes (Signed)
[facility] Gastroenterology Progress Note  [name] [name] 56 y.o. [birth year]  CC:  Variceal bleed   Subjective:  Patient feeling better this morning. Tolerating soft diet. Last bowel movement yesterday evening was brown in color. Patient currently ambulating in the room without any difficulty. Denied any nausea or vomiting. Small drop in hemoglobin noted. Patient remains tachycardic  ROS : Negative for nausea, vomiting or abdominal pain.   Objective: Vital signs in last 24 hours: Vitals:   01/11/17 2134 01/12/17 0634  BP: 108/69 112/71  Pulse: (!) 115 (!) 110  Resp: 18 17  Temp: 98.6 F (37 C) 98.5 F (36.9 C)    Physical Exam:  Constitutional: She is oriented to person, place, and time and well-developed, well-nourished, and in no distress.  HENT:  Head: Normocephalic.  Mouth/Throat: Oropharynx is clear and moist.  Eyes: EOM are normal. Scleral icterus is present.  Neck: Normal range of motion. Neck supple.  Cardiovascular: Normal rate and regular rhythm.   Pulmonary/Chest: Effort normal and breath sounds normal. No respiratory distress. She has no wheezes.  Abdominal: Soft. Bowel sounds are normal. Nondistended. She exhibits no mass. There is no tenderness. There is no rebound and no guarding.  Psychiatric: Memory and affect normal.    Lab Results:  Recent Labs  01/10/17 2201  NA 135  K 3.9  CL 106  CO2 20*  GLUCOSE 157*  BUN 27*  CREATININE 0.97  CALCIUM 8.7*   No results for input(s): AST, ALT, ALKPHOS, BILITOT, PROT, ALBUMIN in the last 72 hours.  Recent Labs  01/11/17 0543 01/12/17 0540  WBC 10.2 9.2  HGB 8.0* 7.3*  HCT 24.1* 22.3*  MCV 105.7* 107.7*  PLT 87* 74*   No results for input(s): LABPROT, INR in the last 72 hours.    Assessment/Plan: - Esophageal variceal bleed. EGD 01/09/2017 showed very large esophageal varices with stigmata of recent bleeding. Status post 5 bands placement. 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 S/P Rx with Samuella Cota and Ribavirin  in 2016  - History of hepatic encephalopathy - Thrombocytopenia - Normal LFTs. From underlying decompensated cirrhosis - Acute blood loss anemia - COPD  Recommendations ---------------------------- - Small drop in hemoglobin noted. BUN also trending up. Patient  last bowel movement yesterday evening was brown in color - Continue octreotide for one more day  - Change diet to full liquid. NPO PM . May need repeat EGD or TIPS  if evidence of active bleeding  - continue  antibiotics while in the hospital. Continue PPI -  - Although patient would benefit from nonselective beta blocker, I would avoid it for now given history of COPD and noncompliance. - Patient was advised to follow up with primary GI Dr.  Orvis Brill at [facility]  after discharge.  - GI will follow.   Otis Brace MD, [place] 01/12/2017, 9:47 AM  Pager [phone]  If no answer or after 5 PM call [phone]

## 2017-01-13 LAB — CBC
HEMATOCRIT: 23.4 % — AB (ref 36.0–46.0)
Hemoglobin: 7.7 g/dL — ABNORMAL LOW (ref 12.0–15.0)
MCH: 34.8 pg — AB (ref 26.0–34.0)
MCHC: 32.9 g/dL (ref 30.0–36.0)
MCV: 105.9 fL — AB (ref 78.0–100.0)
Platelets: 77 10*3/uL — ABNORMAL LOW (ref 150–400)
RBC: 2.21 MIL/uL — ABNORMAL LOW (ref 3.87–5.11)
RDW: 17.4 % — AB (ref 11.5–15.5)
WBC: 9.3 10*3/uL (ref 4.0–10.5)

## 2017-01-13 LAB — CULTURE, BLOOD (ROUTINE X 2)
CULTURE: NO GROWTH
Culture: NO GROWTH

## 2017-01-13 MED ORDER — FUROSEMIDE 80 MG PO TABS
80.0000 mg | ORAL_TABLET | Freq: Every day | ORAL | Status: DC
  Administered 2017-01-13 – 2017-01-14 (×2): 80 mg via ORAL
  Filled 2017-01-13 (×2): qty 1

## 2017-01-13 MED ORDER — WHITE PETROLATUM GEL
Status: AC
  Administered 2017-01-13: 23:00:00
  Filled 2017-01-13: qty 1

## 2017-01-13 MED ORDER — SPIRONOLACTONE 100 MG PO TABS
200.0000 mg | ORAL_TABLET | Freq: Every day | ORAL | Status: DC
  Administered 2017-01-13 – 2017-01-14 (×2): 200 mg via ORAL
  Filled 2017-01-13 (×2): qty 2

## 2017-01-13 NOTE — Progress Notes (Signed)
Subjective: Reports weakness and generalized malaise. No hematemesis or hematochezia. Dark brown stools.  Objective: Vital signs in last 24 hours: Temp:  [97.3 F (36.3 C)-98.1 F (36.7 C)] 98.1 F (36.7 C) (03/19 0533) Pulse Rate:  [96-119] 96 (03/19 0533) Resp:  [18] 18 (03/19 0533) BP: (113-130)/(68-80) 113/71 (03/19 0533) SpO2:  [91 %-97 %] 91 % (03/19 0758) Weight change:  Last BM Date: 01/12/17  PE: GEN:  Chronically ill-appearing, much older-appearing than stated age ABD:  Soft, protuberant, no obvious ascites EXT:  No edema; multiple ecchymoses and spider angiomata  Lab Results: CBC    Component Value Date/Time   WBC 9.3 01/13/2017 0533   RBC 2.21 (L) 01/13/2017 0533   HGB 7.7 (L) 01/13/2017 0533   HCT 23.4 (L) 01/13/2017 0533   PLT 77 (L) 01/13/2017 0533   MCV 105.9 (H) 01/13/2017 0533   MCH 34.8 (H) 01/13/2017 0533   MCHC 32.9 01/13/2017 0533   RDW 17.4 (H) 01/13/2017 0533   LYMPHSABS 2.4 01/08/2017 1617   MONOABS 1.8 (H) 01/08/2017 1617   EOSABS 0.2 01/08/2017 1617   BASOSABS 0.2 (H) 01/08/2017 1617   CMP     Component Value Date/Time   NA 135 01/10/2017 2201   NA 132 (L) 12/25/2016 1631   K 3.9 01/10/2017 2201   CL 106 01/10/2017 2201   CO2 20 (L) 01/10/2017 2201   GLUCOSE 157 (H) 01/10/2017 2201   BUN 27 (H) 01/10/2017 2201   BUN 27 (H) 12/25/2016 1631   CREATININE 0.97 01/10/2017 2201   CREATININE 0.59 05/11/2015 1433   CALCIUM 8.7 (L) 01/10/2017 2201   PROT 4.7 (L) 01/09/2017 0308   ALBUMIN 2.0 (L) 01/09/2017 0308   AST 44 (H) 01/09/2017 0308   ALT 35 01/09/2017 0308   ALKPHOS 84 01/09/2017 0308   BILITOT 2.5 (H) 01/09/2017 0308   GFRNONAA >60 01/10/2017 2201   GFRNONAA >89 05/11/2015 1433   GFRAA >60 01/10/2017 2201   GFRAA >89 05/11/2015 1433   Assessment:  1.  Cirrhosis, decompensated (bleeding), HCV and alcohol mediated. 2.  HCV, treatment cure per patient. 3.  Alcohol use, abstinent now per patient. 4.  Variceal bleeding with  EVBL 01/09/17. 5.  Anemia without overt bleeding.  Hgb stable over past 24 hours.  Plan:  1.  Advance diet to full liquids. 2.  Don't see need for emergent endoscopy at this time, although patient will need expedited outpatient endoscopy for further EVBL with her primary GI MD as outpatient in 1-2 weeks. 3.  Stop octreotide. 4.  Transition pantoprazole to oral. 5.  Not candidate for non-selective BB due to her COPD. 6.  Follow CBC once-a-day. 7.  [facility] will follow; hopefully patient can go home in the next 1-2 days.   [name] [name] 01/13/2017, 9:43 AM   Pager [phone] If no answer or after 5 PM call [phone]

## 2017-01-13 NOTE — Progress Notes (Signed)
Physical Therapy Treatment Patient Details Name: [name] [name] MRN: [id number] DOB: June 19, 1961 Today's Date: 01/13/2017    History of Present Illness Pt is a 56 y/o female admitted from home secondary to hematemesis and SOB with an upper GI bleed and history of esophageal varices. PMH including but not limited to Hep C, alcoholic liver cirrhosis, COPD, HTN and cervical cancer.    PT Comments    Pt admitted with above diagnosis. Pt currently with functional limitations due to balance and endurance deficits. Pt was able to ambulate in hallway with incr distance today with slight instability with challenges needing min guard assist for safety.  Feel that pt should use RW for ultimate safety at home and pt does have a RW.  Will follow acutely.   Pt will benefit from skilled PT to increase their independence and safety with mobility to allow discharge to the venue listed below.     Follow Up Recommendations  No PT follow up (Pt has no HHPT benefits with insurance)     Equipment Recommendations  None recommended by PT    Recommendations for Other Services       Precautions / Restrictions Precautions Precautions: Fall Restrictions Weight Bearing Restrictions: No    Mobility  Bed Mobility Overal bed mobility: Needs Assistance Bed Mobility: Supine to Sit     Supine to sit: HOB elevated;Supervision     General bed mobility comments: Able to come to EOb without assist.   Transfers Overall transfer level: Needs assistance Equipment used: None Transfers: Sit to/from Stand Sit to Stand: Supervision         General transfer comment: ambulated with min guard assist and no device, pt reaching for furniture, but no LOB, pt can use her walker at home.  With challenges of turning head side to side and up and down pt drifts right and left however she did not lose balance.  Pt DOE 3/4 with activity but with pursed lip breathing, breathing improves.  Pt was able to keep sats >94%  throughout on RA.   Ambulation/Gait Ambulation/Gait assistance: Min guard Ambulation Distance (Feet): 198 Feet Assistive device: Rolling walker (2 wheeled) Gait Pattern/deviations: Step-through pattern;Decreased stride length;Drifts right/left;Trunk flexed Gait velocity: decreased, able to regulate speed somewhat with verbal cueing to increase stride length   General Gait Details: modest instability with ambulation; however, no significant LOB or need for physical assistance, min guard for safety. Fatigues quickly.    Stairs            Wheelchair Mobility    Modified Rankin (Stroke Patients Only)       Balance Overall balance assessment: Needs assistance Sitting-balance support: Feet supported Sitting balance-Leahy Scale: Good     Standing balance support: Single extremity supported;During functional activity Standing balance-Leahy Scale: Poor Standing balance comment: single extremity support utilized at times vs no device.  Mild instability noted with need for UE suppport.                    Cognition Arousal/Alertness: Awake/alert Behavior During Therapy: WFL for tasks assessed/performed Overall Cognitive Status: No family/caregiver present to determine baseline cognitive functioning Area of Impairment: Problem solving         Safety/Judgement: Decreased awareness of safety   Problem Solving: Slow processing;Difficulty sequencing      Exercises General Exercises - Lower Extremity Ankle Circles/Pumps: AROM;Both;10 reps;Supine Quad Sets: AROM;Both;10 reps;Seated Long Arc Quad: AROM;Both;10 reps;Seated Hip Flexion/Marching: AROM;Both;10 reps;Seated    General Comments  Pertinent Vitals/Pain Pain Assessment: Faces Faces Pain Scale: Hurts little more Pain Location: back/right hip area towards end of walking in hall Pain Descriptors / Indicators: Discomfort;Guarding Pain Intervention(s): Limited activity within patient's tolerance;Monitored  during session;Repositioned  VSS with pt O2 sat 94% on RA with DOE 3/4.  Home Living                      Prior Function            PT Goals (current goals can now be found in the care plan section) Progress towards PT goals: Progressing toward goals    Frequency    Min 3X/week      PT Plan Current plan remains appropriate    Co-evaluation             End of Session Equipment Utilized During Treatment: Gait belt Activity Tolerance: Patient tolerated treatment well Patient left: in chair;with call bell/phone within reach;with chair alarm set Nurse Communication: Mobility status PT Visit Diagnosis: Other abnormalities of gait and mobility (R26.89);Unsteadiness on feet (R26.81)     Time: 2449-7530 PT Time Calculation (min) (ACUTE ONLY): 20 min  Charges:  $Gait Training: 8-22 mins                    G Codes:       Godfrey Pick Laykin Rainone 01/23/2017, 11:17 AM  [facility] [phone] (pager)

## 2017-01-13 NOTE — Progress Notes (Signed)
   Subjective: Ms. Knittel was seen and evaluated today. She complains of generalized weakness and fatigue today. Denied any hematemesis or hematochezia and endorses dark brown stools.   Objective:  Vital signs in last 24 hours: Vitals:   01/12/17 2124 01/13/17 0533 01/13/17 0757 01/13/17 0758  BP: 113/68 113/71    Pulse: 98 96    Resp: 18 18    Temp: 97.3 F (36.3 C) 98.1 F (36.7 C)    TempSrc: Oral Oral    SpO2:  93% 91% 91%  Weight:      Height:       General: Fatigued. Chronically-ill appearing caucasian female resting in bedside chair. In no acute distress. HENT: EOMI. No conjunctival injection or ptosis. Cardiovascular: Tachycardic but otherwise regular. No murmur or rub appreciated. Pulmonary: Faint bibasilar crackles. Unlabored breathing.  Abdomen: Soft, non-tender and non-distended. No guarding or rigidity. +bowel sounds.  Extremities: 1-2+ pitting edema.  Psych: Mood anxious and affect was mood congruent. Responds to questions appropriately however seems fatigued  Assessment/Plan:  Principal Problem:   Gastrointestinal hemorrhage with hematemesis Active Problems:   History of hepatitis C   Anxiety   Essential hypertension, benign   Esophageal varices in cirrhosis (HCC)   Hepatic encephalopathy (HCC)   Cirrhosis (HCC)   Hypothyroidism   Hyperkalemia   GI bleed  Gastrointestinal Hemorrhage with Hematemesis, Upper GI, +FOBT: s/p EGD with banding of her large esophageal varices. These appeared to have recently bled and she had large volume BRB in stomach. Bowel movements were with BRB --> dark --> now dark brown. She denies any further acute blood loss via hematemesis however reports she feels quite fatigued and weak today. She does appear fatigued on examination. Hb today 7.7 today, was 7.3 yesterday however was steady at 8 the days prior to that. Seen by GI today who don't feel she requires emergent endoscopy currently, however do recommend expedited outpatient  endoscopy for further banding of her esophageal varices with her primary GI MD in 1-2 weeks. GI also recommends d/c octreotide and hope patient can be d/c in 1-2 days.  -VSS however slightly tachycardic on this mornings exam -Hb 7.7 today  -D/c octreotide -Repeat CBC tomorrow AM, Transfuse if <7 -PO Protonix -Full liquid diet  Alcohol-Associated Cirrhosis, Hepatitis C (SVR12 in 2016), Hx of Elevated AFP, Thrombocytopenia: Was started on SBP prophylaxis with IV ceftriaxone on admission however US showed minimal ascites and abx were discontinued yesterday. MELD score 17 at her last transplant appointment, and is considered to be a poor transplant candidate.  She is volume up on exam today however saturating well and not in respiratory distress. Pressures stable. Will resume her home lasix and spironolactone today.  -Resume Lasix 80 mg and Spironolactone 200 mg -AFP 09/2011 13.6 --> 54.6 (03/2012)   -Continue to monitor abdominal exams -Follow-up Dr. Patsy Baltimore at [facility] upon d/c  COPD: Lungs clear and remains saturating well. Spiriva, Dulera and Albuterol.   Hypothyroidism: Was on synthroid 50 mcg however this was d/c overnight by her PCP, Dr. Randell Patient.    Dispo: Anticipated discharge 1-2 days.   Philopateer Strine, DO 01/13/2017, 10:08 AM Pager: [phone]

## 2017-01-14 LAB — CBC
HCT: 25.8 % — ABNORMAL LOW (ref 36.0–46.0)
HEMOGLOBIN: 8.5 g/dL — AB (ref 12.0–15.0)
MCH: 34.7 pg — ABNORMAL HIGH (ref 26.0–34.0)
MCHC: 32.9 g/dL (ref 30.0–36.0)
MCV: 105.3 fL — AB (ref 78.0–100.0)
Platelets: 103 10*3/uL — ABNORMAL LOW (ref 150–400)
RBC: 2.45 MIL/uL — AB (ref 3.87–5.11)
RDW: 17.2 % — ABNORMAL HIGH (ref 11.5–15.5)
WBC: 14.7 10*3/uL — AB (ref 4.0–10.5)

## 2017-01-14 NOTE — Progress Notes (Signed)
   Subjective: Ms. Gunnels was seen and evaluated today. Reports she feels less fatigued than yesterday. Reports 2 normal brown BMs today. No more hematemesis. Patient agreeable to discharge and suspect she could be dc today if okay with GI.   Objective:  Vital signs in last 24 hours: Vitals:   01/13/17 1415 01/13/17 2004 01/13/17 2253 01/14/17 0541  BP: (!) 116/58  113/62 122/68  Pulse: (!) 105 98 (!) 107 (!) 105  Resp:  18 17 17   Temp: 98 F (36.7 C)  98.6 F (37 C) 98.4 F (36.9 C)  TempSrc: Oral  Oral Oral  SpO2: 92% 93% 93% 93%  Weight:    171 lb 8 oz (77.8 kg)  Height:       General: Chronically-ill appearing caucasian female resting in bedside chair. In no acute distress. HENT: EOMI. No conjunctival injection or ptosis. Cardiovascular: RRR. No murmur or rub appreciated. Pulmonary: CTA BL. Unlabored breathing.  Abdomen: Soft, non-tender and non-distended. No guarding or rigidity. +bowel sounds.  Psych: Pleasant and open to conversation. Mood normal and affect was mood congruent. Responds to questions appropriately.  Assessment/Plan:  Principal Problem:   Gastrointestinal hemorrhage with hematemesis Active Problems:   History of hepatitis C   Anxiety   Essential hypertension, benign   Esophageal varices in cirrhosis (HCC)   Hepatic encephalopathy (HCC)   Cirrhosis (HCC)   Hypothyroidism   Hyperkalemia   GI bleed  Gastrointestinal Hemorrhage with Hematemesis, Upper GI, +FOBT: s/p EGD with banding of her large esophageal varices who had stigmata of recent bleeding. 2 brown BMs today and no hematemesis since admission. Hb stable today at 8.5 and she is less fatigued today on exam. Currently awaiting recs from GI however believe patient can be discharged today with close follow-up in clinic. Tolerating diet.  -VSS -Await GI recs, hope for d/c today -PO Protonix -Repeat CBC tomorrow if still admitted, transfuse if <7  Alcohol-Associated Cirrhosis, Hepatitis C (SVR12  in 2016), Hx of Elevated AFP, Thrombocytopenia: Was started on SBP prophylaxis with IV ceftriaxone on admission however US showed minimal ascites and abx were discontinued as clinical suspicion low. Lasix and spironolactone were resumed yesterday with good effect, lungs clear today.  -Continue Lasix, spironolactone  COPD: Lungs clear and remains saturating well. Spiriva, Dulera and Albuterol.    Dispo: Anticipated discharge today or tomorrow.   Rjay Revolorio, DO 01/14/2017, 7:57 AM Pager: [phone]

## 2017-01-14 NOTE — Progress Notes (Signed)
Subjective: No hematemesis. Brown stools. No abdominal pain  Objective: Vital signs in last 24 hours: Temp:  [98 F (36.7 C)-98.6 F (37 C)] 98.4 F (36.9 C) (03/20 0541) Pulse Rate:  [98-107] 105 (03/20 0541) Resp:  [17-18] 17 (03/20 0541) BP: (113-122)/(58-68) 122/68 (03/20 0541) SpO2:  [90 %-93 %] 90 % (03/20 0837) Weight:  [77.8 kg (171 lb 8 oz)] 77.8 kg (171 lb 8 oz) (03/20 0541) Weight change:  Last BM Date: 01/12/17  PE: GEN:  Chronically ill-appearing, older-appearing than stated age, NAD SKIN: Scattered telangiectasias and ecchymoses ABD:  Soft, non-tender EXT:  2-3+ lower extremity edema  Lab Results: CBC    Component Value Date/Time   WBC 14.7 (H) 01/14/2017 0845   RBC 2.45 (L) 01/14/2017 0845   HGB 8.5 (L) 01/14/2017 0845   HCT 25.8 (L) 01/14/2017 0845   PLT 103 (L) 01/14/2017 0845   MCV 105.3 (H) 01/14/2017 0845   MCH 34.7 (H) 01/14/2017 0845   MCHC 32.9 01/14/2017 0845   RDW 17.2 (H) 01/14/2017 0845   LYMPHSABS 2.4 01/08/2017 1617   MONOABS 1.8 (H) 01/08/2017 1617   EOSABS 0.2 01/08/2017 1617   BASOSABS 0.2 (H) 01/08/2017 1617   CMP     Component Value Date/Time   NA 135 01/10/2017 2201   NA 132 (L) 12/25/2016 1631   K 3.9 01/10/2017 2201   CL 106 01/10/2017 2201   CO2 20 (L) 01/10/2017 2201   GLUCOSE 157 (H) 01/10/2017 2201   BUN 27 (H) 01/10/2017 2201   BUN 27 (H) 12/25/2016 1631   CREATININE 0.97 01/10/2017 2201   CREATININE 0.59 05/11/2015 1433   CALCIUM 8.7 (L) 01/10/2017 2201   PROT 4.7 (L) 01/09/2017 0308   ALBUMIN 2.0 (L) 01/09/2017 0308   AST 44 (H) 01/09/2017 0308   ALT 35 01/09/2017 0308   ALKPHOS 84 01/09/2017 0308   BILITOT 2.5 (H) 01/09/2017 0308   GFRNONAA >60 01/10/2017 2201   GFRNONAA >89 05/11/2015 1433   GFRAA >60 01/10/2017 2201   GFRAA >89 05/11/2015 1433   Assessment:  1.  Cirrhosis, decompensated (bleeding), HCV and alcohol mediated. 2.  HCV, treatment cure per patient. 3.  Alcohol use, abstinent now per  patient. 4.  Variceal bleeding with EVBL 01/09/17. 5.  Anemia without overt bleeding.  Hgb stable past 48 hours.  Plan:  1.  Pantoprazole 40 mg po bid, now and upon discharge. 2.  Resume diuretics and other home liver meds. 3.  Low sodium diet. 4.  OK to discharge home today from GI perspective, needs follow-up with Dr. Marty Heck (her outpatient hepatologist/gastroenterologist) within the next 1-2 weeks, will need repeat EGD for possible further esophageal variceal banding within a couple weeks. 5.  Will sign-off; please call with questions; thank you for the consultation.  Landry Dyke 01/14/2017, 12:07 PM   Pager [phone] If no answer or after 5 PM call [phone]

## 2017-01-14 NOTE — Progress Notes (Signed)
Discharge paperwork given to patient and reviewed. IV removed. Patient verbalized understanding of discharge instructions and follow-up appointment. Patient is ready for discharge.

## 2017-01-14 NOTE — Progress Notes (Signed)
SATURATION QUALIFICATIONS: (This note is used to comply with regulatory documentation for home oxygen)  Patient Saturations on Room Air at Rest = 91-93%  Patient Saturations on Room Air while Ambulating = 87-89%  Patient Saturations on 2 Liters of oxygen while Ambulating = 90-94%  Please briefly explain why patient needs home oxygen:Will need home O2.  PEr pt, she had O2 at night PTA.  Thanks.  [place] [phone] (pager)

## 2017-01-14 NOTE — Progress Notes (Addendum)
Physical Therapy Treatment Patient Details Name: [name] [name] MRN: [id number] DOB: 12/23/1960 Today's Date: 01/14/2017    History of Present Illness Pt is a 56 y/o female admitted from home secondary to hematemesis and SOB with an upper GI bleed and history of esophageal varices. PMH including but not limited to Hep C, alcoholic liver cirrhosis, COPD, HTN and cervical cancer.    PT Comments    Pt admitted with above diagnosis. Pt currently with functional limitations due to balance and endurance deficits. Pt was able to ambulate with min assist with challenges to balance.  Scored 17/24 on DGI suggesting risk of falls without device.  Pt agrees to use device at home.  Desats without O2.  Will need home O2.  Will continue PT. Pt will benefit from skilled PT to increase their independence and safety with mobility to allow discharge to the venue listed below.     SATURATION QUALIFICATIONS: (This note is used to comply with regulatory documentation for home oxygen)  Patient Saturations on Room Air at Rest = 91-93%  Patient Saturations on Room Air while Ambulating = 87-89%  Patient Saturations on 2 Liters of oxygen while Ambulating = 90-94%  Please briefly explain why patient needs home oxygen:Will need home O2.  PEr pt, she had O2 at night PTA  Follow Up Recommendations  No PT follow up (Pt has no HHPT benefits with insurance)     Equipment Recommendations  home O2-needs a lightweight portable carrier, lightweight 18x 16 wheelchair with footrests, desk arms, anti tippers and pressure relieving cushion.     Recommendations for Other Services       Precautions / Restrictions Precautions Precautions: Fall Restrictions Weight Bearing Restrictions: No    Mobility  Bed Mobility               General bed mobility comments: standing at closet on arrival.   Transfers Overall transfer level: Needs assistance Equipment used: None Transfers: Sit to/from Stand Sit to Stand:  Supervision Stand pivot transfers: Supervision;Min guard       General transfer comment: ambulated with min guard assist and no device, pt reaching for furniture, but no LOB, pt can use her walker at home.  Pt DOE 3/4 with activity with sats as low as 87%.   Ambulation/Gait Ambulation/Gait assistance: Min guard Ambulation Distance (Feet): 198 Feet (100 feet then 98 feet) Assistive device: Rolling walker (2 wheeled) Gait Pattern/deviations: Step-through pattern;Decreased stride length;Drifts right/left;Trunk flexed Gait velocity: decreased, able to regulate speed somewhat with verbal cueing to increase stride length Gait velocity interpretation: Below normal speed for age/gender General Gait Details: modest instability with ambulation; however, no significant LOB or need for physical assistance unless challenged, min guard for safety. Fatigues quickly. Had to have 1 seated rest break.  Pt will need home O2 to wear iwith activity as she desats.    Stairs Stairs: Yes   Stair Management: Step to pattern;Forwards;With cane Number of Stairs: 3 General stair comments: Pt needed single UE suppport and min assist for baalnce to ascend and descend steps.  Wheelchair Mobility    Modified Rankin (Stroke Patients Only)       Balance Overall balance assessment: Needs assistance Sitting-balance support: Feet supported;No upper extremity supported Sitting balance-Leahy Scale: Good     Standing balance support: Single extremity supported;During functional activity Standing balance-Leahy Scale: Poor Standing balance comment: single extremity support utilized at times vs no device.  Mild instability noted with need for UE suppport with challenges.  Cognition Arousal/Alertness: Awake/alert Behavior During Therapy: WFL for tasks assessed/performed Overall Cognitive Status: No family/caregiver present to determine baseline cognitive functioning Area of Impairment:  Problem solving         Safety/Judgement: Decreased awareness of safety   Problem Solving: Slow processing;Difficulty sequencing      Exercises      General Comments General comments (skin integrity, edema, etc.): Scored 17/24 on DGI suggesting need for device for safety.  Pt aware.        Pertinent Vitals/Pain Pain Assessment: Faces Faces Pain Scale: Hurts little more Pain Location: back/right hip area towards end of walking in hall Pain Descriptors / Indicators: Discomfort;Guarding Pain Intervention(s): Limited activity within patient's tolerance;Monitored during session;Premedicated before session;Repositioned    Home Living                      Prior Function            PT Goals (current goals can now be found in the care plan section) Progress towards PT goals: Progressing toward goals    Frequency    Min 3X/week      PT Plan Current plan remains appropriate    Co-evaluation             End of Session Equipment Utilized During Treatment: Gait belt;Oxygen Activity Tolerance: Patient tolerated treatment well;Patient limited by fatigue;Patient limited by pain Patient left: in chair;with call bell/phone within reach;with chair alarm set Nurse Communication: Mobility status PT Visit Diagnosis: Other abnormalities of gait and mobility (R26.89);Unsteadiness on feet (R26.81)     Time: 5053-9767 PT Time Calculation (min) (ACUTE ONLY): 23 min  Charges:  $Gait Training: 23-37 mins                    G Codes:       Denice Paradise 01-20-2017, 11:58 AM [facility] [phone] (pager)

## 2017-01-15 ENCOUNTER — Other Ambulatory Visit: Payer: Self-pay | Admitting: *Deleted

## 2017-01-15 ENCOUNTER — Other Ambulatory Visit (HOSPITAL_COMMUNITY): Payer: Self-pay

## 2017-01-15 DIAGNOSIS — F331 Major depressive disorder, recurrent, moderate: Secondary | ICD-10-CM

## 2017-01-15 MED ORDER — BUSPIRONE HCL 15 MG PO TABS: 15.0000 mg | ORAL_TABLET | ORAL | 0 refills | Status: DC

## 2017-01-15 MED ORDER — ALBUTEROL SULFATE HFA 108 (90 BASE) MCG/ACT IN AERS: 2.0000 | INHALATION_SPRAY | Freq: Four times a day (QID) | RESPIRATORY_TRACT | 1 refills | Status: AC | PRN

## 2017-01-15 MED ORDER — TIOTROPIUM BROMIDE MONOHYDRATE 18 MCG IN CAPS: 18.0000 ug | ORAL_CAPSULE | Freq: Every day | RESPIRATORY_TRACT | 3 refills | Status: AC

## 2017-01-15 MED ORDER — DICLOFENAC SODIUM 1 % TD GEL: 4.0000 g | Freq: Four times a day (QID) | TRANSDERMAL | 3 refills | Status: AC

## 2017-01-15 MED ORDER — ALBUTEROL SULFATE (2.5 MG/3ML) 0.083% IN NEBU: 2.5000 mg | INHALATION_SOLUTION | Freq: Four times a day (QID) | RESPIRATORY_TRACT | 5 refills | Status: AC | PRN

## 2017-01-15 MED ORDER — LORAZEPAM 1 MG PO TABS: 1.0000 mg | ORAL_TABLET | Freq: Two times a day (BID) | ORAL | 0 refills | Status: DC

## 2017-01-15 MED ORDER — HYDROXYZINE HCL 25 MG PO TABS: 25.0000 mg | ORAL_TABLET | Freq: Three times a day (TID) | ORAL | 0 refills | Status: DC

## 2017-01-16 ENCOUNTER — Other Ambulatory Visit: Payer: Self-pay

## 2017-01-16 MED ORDER — SPIRONOLACTONE 100 MG PO TABS: 200.0000 mg | ORAL_TABLET | Freq: Every day | ORAL | 3 refills | Status: AC

## 2017-01-16 NOTE — Discharge Summary (Signed)
Name: [name] [name] MRN: [id number] DOB: [birth year] 56 y.o. PCP: Milagros Loll, MD  Date of Admission: 01/08/2017  4:10 PM Date of Discharge: 01/14/2017 Attending Physician: Dr. Rogene [place]   Discharge Diagnosis: 1. GI hemorrhage with hematemesis 2. Cirrhosis with esophageal varices 3. Hyperkalemia  Principal Problem:   Gastrointestinal hemorrhage with hematemesis Active Problems:   History of hepatitis C   Anxiety   Essential hypertension, benign   Esophageal varices in cirrhosis (HCC)   Hepatic encephalopathy (HCC)   Cirrhosis (HCC)   Hypothyroidism   Hyperkalemia   GI bleed   Discharge Medications: Allergies as of 01/14/2017      Reactions   Ibuprofen Other (See Comments)   ulcers   Tylenol [acetaminophen] Other (See Comments)   2/2 liver damage   Amitriptyline Other (See Comments)   hallucinations      Medication List    STOP taking these medications   levothyroxine 50 MCG tablet Commonly known as:  SYNTHROID, LEVOTHROID   propranolol 20 MG tablet Commonly known as:  INDERAL     TAKE these medications   doxepin 25 MG capsule Commonly known as:  SINEQUAN Take 25 mg by mouth at bedtime.   furosemide 80 MG tablet Commonly known as:  LASIX Take 1 tablet (80 mg total) by mouth daily.   Incontinence Brief Large Misc Use as instructed.   lactulose 10 GM/15ML solution Commonly known as:  CHRONULAC Take 30 mLs (20 g total) by mouth 3 (three) times daily.   Melatonin 1 MG Caps Take 1 mg by mouth at bedtime.   meloxicam 7.5 MG tablet Commonly known as:  MOBIC Take 1 tablet (7.5 mg total) by mouth 2 (two) times daily as needed for pain.   mometasone-formoterol 200-5 MCG/ACT Aero Commonly known as:  DULERA Inhale 2 puffs into the lungs 2 (two) times daily.   pantoprazole 40 MG tablet Commonly known as:  PROTONIX Take 1 tablet (40 mg total) by mouth daily.   Potassium Chloride ER 20 MEQ Tbcr Take 20 mEq by mouth 2 (two) times daily.     rifaximin 550 MG Tabs tablet Commonly known as:  XIFAXAN Take 1 tablet (550 mg total) by mouth 2 (two) times daily.   spironolactone 100 MG tablet Commonly known as:  ALDACTONE Take 2 tablets (200 mg total) by mouth daily.   traMADol 50 MG tablet Commonly known as:  ULTRAM Take 1 tablet (50 mg total) by mouth every 12 (twelve) hours as needed for severe pain.      Disposition and follow-up:   [name] [name] was discharged from [facility] in stable condition.  At the hospital follow up visit please address:  1.  GI Bleed: Evaluate for further blood loss including hematemesis, hematochezia or melena.  Esophageal Varices: She will need to be seen by gastroenterology as an outpatient within 2 weeks of discharge.   2.  Labs / imaging needed at time of follow-up: CBC  3.  Pending labs/ test needing follow-up: none.  Follow-up Appointments: [facility] Course by problem list: Principal Problem:   Gastrointestinal hemorrhage with hematemesis Active Problems:   History of hepatitis C   Anxiety   Essential hypertension, benign   Esophageal varices in cirrhosis (HCC)   Hepatic encephalopathy (HCC)   Cirrhosis (HCC)   Hypothyroidism   Hyperkalemia   GI bleed   1. GI bleed secondary to esophageal varices in the setting of known cirrhosis, acute blood loss anemia [name] [name]  is a very pleasant 56 year old female with medical history significant for alcoholic liver cirrhosis with known esophageal varices who presents for evaluation of 5 episodes of hematemesis in the preceding 2 days. Hemoglobin on admission 8.9 however decreased to 7.3 at the lowest during this admission. She was started on IV octreotide, IV Protonix as well as IV ceftriaxone for SBP prophylaxis. GI evaluated the patient who performed an EGD with banding of her multiple large esophageal varices with stigmata of recent bleeding. There was also a large volume of bright red blood appreciated in  the stomach. She did not have any more episodes of hematemesis during admission however past bright red blood and maroon blood prior to having normal brown bowel movements. Her hemoglobin was stable at time of discharge at 8.5. IV octreotide, IV stat ceftriaxone were subsequently discontinued and Protonix was transitioned to oral formulation. GI recommends outpatient follow-up in 1-2 weeks following discharge for additional banding. her propanolol was discontinued in the setting of patient's underlying pulmonary disease at the recommendations of gastroenterology.  2. History of hepatitis C, history of hepatic encephalopathy, thrombocytopenia [name] was not encephalopathic during this admission and was actually quite clear minded. She reports good compliance with her rifaximin mean however her some questionable compliance with her lactulose. She follows with a transplant physician and will follow up with this provider following discharge.   3. Hyperkalemia, hyponatremia [name] [name] was hyperkalemic and hyponatremic at 125 on initial presentation at 5.6. She was subsequently given Kayexalate and IV fluids with resolution of her metabolic derangements. There were no EKG changes appreciated.  Discharge Vitals:   BP 132/73 (BP Location: Right Arm)   Pulse (!) 107   Temp 98.1 F (36.7 C) (Oral)   Resp 17   Ht 5\' 4"  (1.626 m)   Wt 171 lb 8 oz (77.8 kg)   SpO2 97%   BMI 29.44 kg/m   Pertinent Labs, Studies, and Procedures:  CBC: Acute blood loss anemia with lowest value 7.3. Chronic thrombocytopenia Cmet: Potassium 5.6, sodium 125. Alkaline phosphatase 146. Albumin 2.6. Total bilirubin 3.3. EGD: Demonstrated multiple large (greater than 5 mm) esophageal varices with recent stigmata of bleeding. Large volume blood in the gastric cavity. Esophageal varices were banded during procedure.  Discharge Instructions: [name], please return to the emergency department immediately if you should  develop any more signs of blood loss including coughing up blood, throwing up blood or passing blood or dark black stools. He will need a follow-up with your primary care physician as well as your gastroenterologist within 1-2 weeks after discharge. You will require additional intervention with your Gi doctor.   SignedEinar Gip, DO 01/16/2017, 12:22 PM   Pager: [phone]

## 2017-01-17 ENCOUNTER — Telehealth: Payer: Self-pay | Admitting: Pulmonary Disease

## 2017-01-17 ENCOUNTER — Telehealth: Payer: Self-pay | Admitting: *Deleted

## 2017-01-17 NOTE — Telephone Encounter (Signed)
Call from [facility] from [facility] - requesting verbal order for "Medical Social Worker" . States thinks their sw can provide addition resources; along with Adult protective services. VO given - if not appropriate let me know. Thanks

## 2017-01-17 NOTE — Telephone Encounter (Signed)
Agreed.  Thanks.  

## 2017-01-20 ENCOUNTER — Ambulatory Visit (INDEPENDENT_AMBULATORY_CARE_PROVIDER_SITE_OTHER): Payer: Medicaid Other | Admitting: Internal Medicine

## 2017-01-20 VITALS — BP 113/56 | HR 100 | Temp 99.7°F | Ht 64.0 in | Wt 174.6 lb

## 2017-01-20 DIAGNOSIS — J029 Acute pharyngitis, unspecified: Secondary | ICD-10-CM

## 2017-01-20 DIAGNOSIS — B351 Tinea unguium: Secondary | ICD-10-CM

## 2017-01-20 DIAGNOSIS — R238 Other skin changes: Secondary | ICD-10-CM

## 2017-01-20 DIAGNOSIS — L603 Nail dystrophy: Secondary | ICD-10-CM | POA: Diagnosis not present

## 2017-01-20 DIAGNOSIS — R233 Spontaneous ecchymoses: Secondary | ICD-10-CM | POA: Diagnosis not present

## 2017-01-20 DIAGNOSIS — F1021 Alcohol dependence, in remission: Secondary | ICD-10-CM

## 2017-01-20 DIAGNOSIS — K7031 Alcoholic cirrhosis of liver with ascites: Secondary | ICD-10-CM

## 2017-01-20 DIAGNOSIS — R351 Nocturia: Secondary | ICD-10-CM | POA: Diagnosis not present

## 2017-01-20 DIAGNOSIS — K7030 Alcoholic cirrhosis of liver without ascites: Secondary | ICD-10-CM | POA: Diagnosis not present

## 2017-01-20 DIAGNOSIS — K644 Residual hemorrhoidal skin tags: Secondary | ICD-10-CM

## 2017-01-20 DIAGNOSIS — Z87891 Personal history of nicotine dependence: Secondary | ICD-10-CM

## 2017-01-20 DIAGNOSIS — R000 Tachycardia, unspecified: Secondary | ICD-10-CM | POA: Diagnosis not present

## 2017-01-20 DIAGNOSIS — K649 Unspecified hemorrhoids: Secondary | ICD-10-CM

## 2017-01-20 DIAGNOSIS — Z5189 Encounter for other specified aftercare: Secondary | ICD-10-CM | POA: Diagnosis not present

## 2017-01-20 MED ORDER — PHENOL 1.4 % MT LIQD: 1.0000 | OROMUCOSAL | 1 refills | Status: AC | PRN

## 2017-01-20 MED ORDER — HYDROCORTISONE 2.5 % RE CREA: TOPICAL_CREAM | RECTAL | 1 refills | Status: AC

## 2017-01-20 MED ORDER — PANTOPRAZOLE SODIUM 40 MG PO TBEC: 40.0000 mg | DELAYED_RELEASE_TABLET | Freq: Two times a day (BID) | ORAL | 1 refills | Status: AC

## 2017-01-20 MED ORDER — DOCUSATE SODIUM 100 MG PO CAPS: 100.0000 mg | ORAL_CAPSULE | Freq: Every day | ORAL | 1 refills | Status: AC | PRN

## 2017-01-20 NOTE — Patient Instructions (Addendum)
Start using anusol cream applied to your external hemorrhoids.  Start taking protonix twice a day.   You can use chloraseptic spray for your sore throat   Keep your skin moisturized to prevent skin cracks.   STOP taking mobic.

## 2017-01-20 NOTE — Progress Notes (Signed)
   CC: alcoholic cirrhosis f/u  HPI:  [name] is a 56 y.o. past medical history as outlined below who presents to clinic for hospital follow-up. She was admitted from 3/14 to 3/20 for a GI bleed with hematemesis secondary to esophageal varices in setting cirrhosis.  Past Medical History:  Diagnosis Date  . Adverse drug effect - [facility] 03/07/2015  . Alcoholic cirrhosis ([place])   . Allergy   . Anxiety   . Arthritis   . cervical Cancer 1999  . Cholelithiasis   . COPD (chronic obstructive pulmonary disease) ([place])   . Decompensated hepatic cirrhosis ([place]) 03/07/2015   Side effect of Vikera  . Depression   . Esophageal varices ([place])   . Esophagitis 2010  . Fatty liver   . Gastritis   . GERD (gastroesophageal reflux disease)   . Heart palpitations   . Hepatitis C    chronic hepatitis C and Steatohepatitis (hep grade 2, stage 2-3) per 05/13/08 liver biopsy  . Hypertension   . Obesity   . Portal hypertension ([place])   . Shortness of breath   . Splenomegaly   . Tubulovillous adenoma of colon   . Visual hallucination    since 09/2010/notes 10/30/2012    Review of Systems:  Positive for sore throat, BRBPR and easy bruising. Denies SOB or having to strain w/ defecation.   Physical Exam:  Vitals:   01/20/17 1331  BP: (!) 113/56  Pulse: 100  Temp: 99.7 F (37.6 C)  TempSrc: Oral  SpO2: 96%  Weight: 174 lb 9.6 oz (79.2 kg)  Height: 5\' 4"  (1.626 m)   Physical Exam  Constitutional:  appears well-developed and well-nourished. No distress.  HENT:  Head: Normocephalic and atraumatic.  Nose: Nose normal.  Cardiovascular: tachycardic, regular rhythm and normal heart sounds.  Exam reveals no gallop and no friction rub.   No murmur heard. Pulmonary/Chest: Effort normal and breath sounds normal. No respiratory distress.  has no wheezes.no rales.  Abdominal: Soft. Bowel sounds are normal.  exhibits no distension. There is no tenderness. There is no rebound and no guarding.    Skin: dry skin with scabbed cracks on 1st and 3rd rt hand digits, scattered ecchymosis on b/l forearms. Ext: 3+ pedal edema b/l without any open skin breaks, dystrophic 1st big toe nail on left foot.     Assessment & Plan:   See Encounters Tab for problem based charting.  Patient discussed with Dr. Angelia Mould

## 2017-01-22 DIAGNOSIS — K649 Unspecified hemorrhoids: Secondary | ICD-10-CM | POA: Insufficient documentation

## 2017-01-22 DIAGNOSIS — B351 Tinea unguium: Secondary | ICD-10-CM | POA: Insufficient documentation

## 2017-01-22 NOTE — Assessment & Plan Note (Addendum)
Assessment: Patient presents for hospital follow-up when she was admitted for a GI bleed with hematemesis secondary to esophageal varices. She denies any further episodes of hematemesis. She lives alone but has a nurse come to her house to administer her medications. She states she is compliant with all of her medications. She states she is unable to take a shower because of fear she will fall in the shower as it has happened in the past. She is requesting an aide to help with bathing and household chores. She is also having sore throat from endoscopy from hospitalization.   Plan: Referral placed for home health aide. Instructed to take Protonix 40 mg twice a day indefinitely. Rx for chloraseptic spray for sore throat. She has follow-up with her PCP on April 18.

## 2017-01-22 NOTE — Assessment & Plan Note (Signed)
Patient has difficulty caring for her feet. She was concerned that there is an open wound on her left foot however on exam there was not any open skin breaks. She has dystropic toe nails and would benefit from podiatry management. Referral placed for podiatry.

## 2017-01-22 NOTE — Assessment & Plan Note (Signed)
Assessment: Patient still having bright red blood per rectum since discharge that has remained stable. She only notices blood on her toilet paper and not in the toilet. She states that her external hemorrhoids feel raw when wiping. She is not having any constipation or having to strain during defecation.  Plan: Rx for Anusol cream for pain and Colace 100 mg daily to help soften her stools

## 2017-01-23 ENCOUNTER — Telehealth: Payer: Self-pay

## 2017-01-23 ENCOUNTER — Other Ambulatory Visit: Payer: Self-pay | Admitting: *Deleted

## 2017-01-23 MED ORDER — LACTULOSE 10 GM/15ML PO SOLN: 20.0000 g | Freq: Three times a day (TID) | ORAL | 2 refills | Status: AC

## 2017-01-23 NOTE — Telephone Encounter (Signed)
Pt is calling back to speak with a nurse. Please call pt back.

## 2017-01-23 NOTE — Telephone Encounter (Signed)
error 

## 2017-01-23 NOTE — Telephone Encounter (Signed)
Agree. Thanks

## 2017-01-23 NOTE — Telephone Encounter (Signed)
Pt calls and states she is not feeling good, states she is weak, her rectum is very painful and she has some bleeding is using the cream,she states she is taking her medicines, states she cannot come to clinic now, she will try to get a way here she is advised that after 1315 it will be hard to see her in [facility], she is encouraged that if she can find a way here to call 911 and go to ED, she stated she would

## 2017-01-23 NOTE — Telephone Encounter (Signed)
Needs to speak with a nurse. Please call back.  

## 2017-01-23 NOTE — Progress Notes (Signed)
Internal Medicine Clinic Attending  Case discussed with Dr. Truong at the time of the visit.  We reviewed the resident's history and exam and pertinent patient test results.  I agree with the assessment, diagnosis, and plan of care documented in the resident's note.  

## 2017-01-24 ENCOUNTER — Encounter (HOSPITAL_COMMUNITY): Payer: Self-pay | Admitting: Psychiatry

## 2017-01-24 ENCOUNTER — Ambulatory Visit (INDEPENDENT_AMBULATORY_CARE_PROVIDER_SITE_OTHER): Payer: Medicaid Other | Admitting: Psychiatry

## 2017-01-24 VITALS — BP 130/78 | HR 110 | Ht 63.0 in | Wt 163.0 lb

## 2017-01-24 DIAGNOSIS — Z87891 Personal history of nicotine dependence: Secondary | ICD-10-CM

## 2017-01-24 DIAGNOSIS — Z79899 Other long term (current) drug therapy: Secondary | ICD-10-CM | POA: Diagnosis not present

## 2017-01-24 DIAGNOSIS — F4322 Adjustment disorder with anxiety: Secondary | ICD-10-CM

## 2017-01-24 DIAGNOSIS — F329 Major depressive disorder, single episode, unspecified: Secondary | ICD-10-CM

## 2017-01-24 DIAGNOSIS — F331 Major depressive disorder, recurrent, moderate: Secondary | ICD-10-CM

## 2017-01-24 MED ORDER — LORAZEPAM 1 MG PO TABS: ORAL_TABLET | ORAL | 3 refills | Status: AC

## 2017-01-24 MED ORDER — BUSPIRONE HCL 15 MG PO TABS: ORAL_TABLET | ORAL | 6 refills | Status: AC

## 2017-01-24 MED ORDER — HYDROXYZINE HCL 25 MG PO TABS: 25.0000 mg | ORAL_TABLET | Freq: Three times a day (TID) | ORAL | 5 refills | Status: AC

## 2017-01-24 MED ORDER — DOXEPIN HCL 25 MG PO CAPS: 25.0000 mg | ORAL_CAPSULE | Freq: Every day | ORAL | 6 refills | Status: AC
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
## 2017-01-27 ENCOUNTER — Other Ambulatory Visit: Payer: Self-pay

## 2017-01-27 NOTE — Telephone Encounter (Signed)
Called pt - c/o constipation; stated she took one docusate yesterday. Wanted to know if ok to take Lactulose. Told her, she should be taking Lactulose 3 times daily as ordered anyway. Stated she has been. Then wanted to know if ok to take another Docusate today. Told should be ok ; only once day. But if she has diarrhea, continue Lactulose and do not take Docusate. She realized it is only as needed.

## 2017-01-27 NOTE — Telephone Encounter (Signed)
Please have her increase her lactulose if she hasn't already to as high as 30 g (45 mL) 4 times daily with goal 2-3 BM a day. Thanks!

## 2017-01-27 NOTE — Telephone Encounter (Signed)
Please call pt back about med.  

## 2017-01-28 NOTE — Telephone Encounter (Signed)
Pt stated she had a BM this morning and taking 30 ml of Lactulose. Informed  "increase her lactulose if she hasn't already to as high as 30 g (45 mL) 4 times daily with goal 2-3 BM a day." per Dr Randell Patient. Voiced understanding and stated ok.

## 2017-01-28 NOTE — Telephone Encounter (Signed)
Thanks

## 2017-01-30 ENCOUNTER — Inpatient Hospital Stay (HOSPITAL_COMMUNITY)
Admission: EM | Admit: 2017-01-30 | Discharge: 2017-02-06 | DRG: 641 | Disposition: A | Discharge status: Hospice | Payer: Medicaid Other | Attending: Oncology | Admitting: Oncology

## 2017-01-30 ENCOUNTER — Encounter (HOSPITAL_COMMUNITY): Payer: Self-pay | Admitting: *Deleted

## 2017-01-30 ENCOUNTER — Emergency Department (HOSPITAL_COMMUNITY): Payer: Medicaid Other

## 2017-01-30 DIAGNOSIS — Z66 Do not resuscitate: Secondary | ICD-10-CM | POA: Diagnosis not present

## 2017-01-30 DIAGNOSIS — L8990 Pressure ulcer of unspecified site, unspecified stage: Secondary | ICD-10-CM | POA: Insufficient documentation

## 2017-01-30 DIAGNOSIS — T500X5A Adverse effect of mineralocorticoids and their antagonists, initial encounter: Secondary | ICD-10-CM | POA: Diagnosis present

## 2017-01-30 DIAGNOSIS — E038 Other specified hypothyroidism: Secondary | ICD-10-CM | POA: Diagnosis present

## 2017-01-30 DIAGNOSIS — K7210 Chronic hepatic failure without coma: Secondary | ICD-10-CM | POA: Diagnosis present

## 2017-01-30 DIAGNOSIS — Z7189 Other specified counseling: Secondary | ICD-10-CM

## 2017-01-30 DIAGNOSIS — E860 Dehydration: Secondary | ICD-10-CM | POA: Diagnosis present

## 2017-01-30 DIAGNOSIS — Z7951 Long term (current) use of inhaled steroids: Secondary | ICD-10-CM | POA: Diagnosis not present

## 2017-01-30 DIAGNOSIS — I10 Essential (primary) hypertension: Secondary | ICD-10-CM | POA: Diagnosis not present

## 2017-01-30 DIAGNOSIS — F329 Major depressive disorder, single episode, unspecified: Secondary | ICD-10-CM | POA: Diagnosis present

## 2017-01-30 DIAGNOSIS — K760 Fatty (change of) liver, not elsewhere classified: Secondary | ICD-10-CM | POA: Diagnosis present

## 2017-01-30 DIAGNOSIS — F17210 Nicotine dependence, cigarettes, uncomplicated: Secondary | ICD-10-CM | POA: Diagnosis present

## 2017-01-30 DIAGNOSIS — J029 Acute pharyngitis, unspecified: Secondary | ICD-10-CM | POA: Diagnosis present

## 2017-01-30 DIAGNOSIS — Z888 Allergy status to other drugs, medicaments and biological substances status: Secondary | ICD-10-CM

## 2017-01-30 DIAGNOSIS — I959 Hypotension, unspecified: Secondary | ICD-10-CM | POA: Diagnosis present

## 2017-01-30 DIAGNOSIS — K7030 Alcoholic cirrhosis of liver without ascites: Secondary | ICD-10-CM | POA: Diagnosis not present

## 2017-01-30 DIAGNOSIS — Z8619 Personal history of other infectious and parasitic diseases: Secondary | ICD-10-CM | POA: Diagnosis not present

## 2017-01-30 DIAGNOSIS — T502X5A Adverse effect of carbonic-anhydrase inhibitors, benzothiadiazides and other diuretics, initial encounter: Secondary | ICD-10-CM | POA: Diagnosis present

## 2017-01-30 DIAGNOSIS — G4700 Insomnia, unspecified: Secondary | ICD-10-CM | POA: Diagnosis present

## 2017-01-30 DIAGNOSIS — K219 Gastro-esophageal reflux disease without esophagitis: Secondary | ICD-10-CM | POA: Diagnosis present

## 2017-01-30 DIAGNOSIS — E039 Hypothyroidism, unspecified: Secondary | ICD-10-CM | POA: Diagnosis not present

## 2017-01-30 DIAGNOSIS — R531 Weakness: Secondary | ICD-10-CM | POA: Diagnosis present

## 2017-01-30 DIAGNOSIS — R197 Diarrhea, unspecified: Secondary | ICD-10-CM | POA: Diagnosis present

## 2017-01-30 DIAGNOSIS — Z803 Family history of malignant neoplasm of breast: Secondary | ICD-10-CM

## 2017-01-30 DIAGNOSIS — J449 Chronic obstructive pulmonary disease, unspecified: Secondary | ICD-10-CM | POA: Diagnosis present

## 2017-01-30 DIAGNOSIS — E871 Hypo-osmolality and hyponatremia: Principal | ICD-10-CM | POA: Diagnosis present

## 2017-01-30 DIAGNOSIS — Z800 Family history of malignant neoplasm of digestive organs: Secondary | ICD-10-CM

## 2017-01-30 DIAGNOSIS — R161 Splenomegaly, not elsewhere classified: Secondary | ICD-10-CM | POA: Diagnosis present

## 2017-01-30 DIAGNOSIS — Z79899 Other long term (current) drug therapy: Secondary | ICD-10-CM | POA: Diagnosis not present

## 2017-01-30 DIAGNOSIS — E872 Acidosis, unspecified: Secondary | ICD-10-CM

## 2017-01-30 DIAGNOSIS — R000 Tachycardia, unspecified: Secondary | ICD-10-CM | POA: Diagnosis present

## 2017-01-30 DIAGNOSIS — Z8049 Family history of malignant neoplasm of other genital organs: Secondary | ICD-10-CM

## 2017-01-30 DIAGNOSIS — K7460 Unspecified cirrhosis of liver: Secondary | ICD-10-CM | POA: Diagnosis not present

## 2017-01-30 DIAGNOSIS — K766 Portal hypertension: Secondary | ICD-10-CM | POA: Diagnosis present

## 2017-01-30 DIAGNOSIS — B182 Chronic viral hepatitis C: Secondary | ICD-10-CM | POA: Diagnosis present

## 2017-01-30 DIAGNOSIS — F411 Generalized anxiety disorder: Secondary | ICD-10-CM | POA: Diagnosis not present

## 2017-01-30 DIAGNOSIS — K7031 Alcoholic cirrhosis of liver with ascites: Secondary | ICD-10-CM | POA: Diagnosis present

## 2017-01-30 DIAGNOSIS — I8510 Secondary esophageal varices without bleeding: Secondary | ICD-10-CM | POA: Diagnosis present

## 2017-01-30 DIAGNOSIS — F17290 Nicotine dependence, other tobacco product, uncomplicated: Secondary | ICD-10-CM

## 2017-01-30 DIAGNOSIS — T501X5A Adverse effect of loop [high-ceiling] diuretics, initial encounter: Secondary | ICD-10-CM | POA: Diagnosis present

## 2017-01-30 DIAGNOSIS — Z886 Allergy status to analgesic agent status: Secondary | ICD-10-CM

## 2017-01-30 DIAGNOSIS — F419 Anxiety disorder, unspecified: Secondary | ICD-10-CM | POA: Diagnosis present

## 2017-01-30 DIAGNOSIS — R05 Cough: Secondary | ICD-10-CM | POA: Diagnosis not present

## 2017-01-30 DIAGNOSIS — K7040 Alcoholic hepatic failure without coma: Secondary | ICD-10-CM | POA: Diagnosis present

## 2017-01-30 DIAGNOSIS — F418 Other specified anxiety disorders: Secondary | ICD-10-CM | POA: Diagnosis not present

## 2017-01-30 DIAGNOSIS — D684 Acquired coagulation factor deficiency: Secondary | ICD-10-CM | POA: Diagnosis present

## 2017-01-30 DIAGNOSIS — B1920 Unspecified viral hepatitis C without hepatic coma: Secondary | ICD-10-CM | POA: Diagnosis not present

## 2017-01-30 DIAGNOSIS — Z515 Encounter for palliative care: Secondary | ICD-10-CM

## 2017-01-30 DIAGNOSIS — R1900 Intra-abdominal and pelvic swelling, mass and lump, unspecified site: Secondary | ICD-10-CM

## 2017-01-30 DIAGNOSIS — Z6828 Body mass index (BMI) 28.0-28.9, adult: Secondary | ICD-10-CM

## 2017-01-30 DIAGNOSIS — J4489 Other specified chronic obstructive pulmonary disease: Secondary | ICD-10-CM | POA: Diagnosis present

## 2017-01-30 DIAGNOSIS — F1021 Alcohol dependence, in remission: Secondary | ICD-10-CM | POA: Diagnosis present

## 2017-01-30 DIAGNOSIS — E669 Obesity, unspecified: Secondary | ICD-10-CM | POA: Diagnosis present

## 2017-01-30 DIAGNOSIS — Z8249 Family history of ischemic heart disease and other diseases of the circulatory system: Secondary | ICD-10-CM

## 2017-01-30 LAB — BASIC METABOLIC PANEL
ANION GAP: 13 (ref 5–15)
ANION GAP: 10 (ref 5–15)
BUN: 17 mg/dL (ref 6–20)
BUN: 17 mg/dL (ref 6–20)
CALCIUM: 8.9 mg/dL (ref 8.9–10.3)
CO2: 16 mmol/L — ABNORMAL LOW (ref 22–32)
CO2: 16 mmol/L — ABNORMAL LOW (ref 22–32)
Calcium: 8.8 mg/dL — ABNORMAL LOW (ref 8.9–10.3)
Chloride: 88 mmol/L — ABNORMAL LOW (ref 101–111)
Chloride: 93 mmol/L — ABNORMAL LOW (ref 101–111)
Creatinine, Ser: 1.06 mg/dL — ABNORMAL HIGH (ref 0.44–1.00)
Creatinine, Ser: 0.94 mg/dL (ref 0.44–1.00)
GFR calc Af Amer: >60 mL/min (ref >60)
GFR, EST NON AFRICAN AMERICAN: 57 mL/min — AB (ref >60)
GLUCOSE: 144 mg/dL — AB (ref 65–99)
Glucose, Bld: 179 mg/dL — ABNORMAL HIGH (ref 65–99)
POTASSIUM: 4.5 mmol/L (ref 3.5–5.1)
Potassium: 4.9 mmol/L (ref 3.5–5.1)
Sodium: 117 mmol/L — CL (ref 135–145)
Sodium: 119 mmol/L — CL (ref 135–145)

## 2017-01-30 LAB — MRSA PCR SCREENING: MRSA by PCR: NEGATIVE

## 2017-01-30 LAB — OSMOLALITY: Osmolality: 256 mOsm/kg — ABNORMAL LOW (ref 275–295)

## 2017-01-30 LAB — CBC
HCT: 25.2 % — ABNORMAL LOW (ref 36.0–46.0)
HEMOGLOBIN: 8.3 g/dL — AB (ref 12.0–15.0)
MCH: 31.4 pg (ref 26.0–34.0)
MCHC: 32.9 g/dL (ref 30.0–36.0)
MCV: 95.5 fL (ref 78.0–100.0)
Platelets: 150 10*3/uL (ref 150–400)
RBC: 2.64 MIL/uL — AB (ref 3.87–5.11)
RDW: 16 % — ABNORMAL HIGH (ref 11.5–15.5)
WBC: 11.3 10*3/uL — AB (ref 4.0–10.5)

## 2017-01-30 LAB — HEPATIC FUNCTION PANEL
ALBUMIN: 2.6 g/dL — AB (ref 3.5–5.0)
ALK PHOS: 147 U/L — AB (ref 38–126)
ALT: 39 U/L (ref 14–54)
AST: 58 U/L — ABNORMAL HIGH (ref 15–41)
BILIRUBIN INDIRECT: 1.6 mg/dL — AB (ref 0.3–0.9)
Bilirubin, Direct: 1.1 mg/dL — ABNORMAL HIGH (ref 0.1–0.5)
TOTAL PROTEIN: 6.1 g/dL — AB (ref 6.5–8.1)
Total Bilirubin: 2.7 mg/dL — ABNORMAL HIGH (ref 0.3–1.2)

## 2017-01-30 LAB — URINALYSIS, ROUTINE W REFLEX MICROSCOPIC
BILIRUBIN URINE: NEGATIVE
Glucose, UA: NEGATIVE mg/dL
Hgb urine dipstick: NEGATIVE
Ketones, ur: NEGATIVE mg/dL
LEUKOCYTES UA: NEGATIVE
NITRITE: NEGATIVE
Protein, ur: NEGATIVE mg/dL
Specific Gravity, Urine: 1.005 (ref 1.005–1.030)
pH: 6 (ref 5.0–8.0)

## 2017-01-30 LAB — AMMONIA: Ammonia: 18 umol/L (ref 9–35)

## 2017-01-30 LAB — I-STAT CG4 LACTIC ACID, ED
Lactic Acid, Venous: 4.34 mmol/L (ref 0.5–1.9)
Lactic Acid, Venous: 2.81 mmol/L (ref 0.5–1.9)

## 2017-01-30 LAB — APTT: APTT: 34 s (ref 24–36)

## 2017-01-30 LAB — PROTIME-INR
INR: 1.28
PROTHROMBIN TIME: 16.1 s — AB (ref 11.4–15.2)

## 2017-01-30 LAB — INFLUENZA PANEL BY PCR (TYPE A & B)
INFLAPCR: NEGATIVE
Influenza B By PCR: NEGATIVE

## 2017-01-30 MED ORDER — MOMETASONE FURO-FORMOTEROL FUM 200-5 MCG/ACT IN AERO
2.0000 | INHALATION_SPRAY | Freq: Two times a day (BID) | RESPIRATORY_TRACT | Status: DC
Start: 2017-01-30 — End: 2017-02-06
  Administered 2017-01-31 – 2017-02-06 (×13): 2 via RESPIRATORY_TRACT
  Filled 2017-01-30: qty 8.8

## 2017-01-30 MED ORDER — DEXTROSE 5 % IV SOLN
1.0000 g | Freq: Once | INTRAVENOUS | Status: AC
  Administered 2017-01-30: 1 g via INTRAVENOUS
  Filled 2017-01-30: qty 10

## 2017-01-30 MED ORDER — ALBUTEROL SULFATE (2.5 MG/3ML) 0.083% IN NEBU: 2.5000 mg | INHALATION_SOLUTION | Freq: Four times a day (QID) | RESPIRATORY_TRACT | Status: DC | PRN

## 2017-01-30 MED ORDER — DM-GUAIFENESIN ER 30-600 MG PO TB12
1.0000 | ORAL_TABLET | Freq: Two times a day (BID) | ORAL | Status: DC
  Administered 2017-01-30 – 2017-02-06 (×14): 1 via ORAL
  Filled 2017-01-30 (×14): qty 1

## 2017-01-30 MED ORDER — ORAL CARE MOUTH RINSE
15.0000 mL | Freq: Two times a day (BID) | OROMUCOSAL | Status: DC
  Administered 2017-01-30 – 2017-02-06 (×11): 15 mL via OROMUCOSAL

## 2017-01-30 MED ORDER — PHENOL 1.4 % MT LIQD
1.0000 | OROMUCOSAL | Status: DC | PRN
  Administered 2017-01-31: 1 via OROMUCOSAL
  Filled 2017-01-30: qty 177

## 2017-01-30 MED ORDER — ENOXAPARIN SODIUM 40 MG/0.4ML ~~LOC~~ SOLN
40.0000 mg | SUBCUTANEOUS | Status: DC
  Administered 2017-01-30 – 2017-02-05 (×7): 40 mg via SUBCUTANEOUS
  Filled 2017-01-30 (×7): qty 0.4

## 2017-01-30 MED ORDER — TIOTROPIUM BROMIDE MONOHYDRATE 18 MCG IN CAPS
18.0000 ug | ORAL_CAPSULE | Freq: Every day | RESPIRATORY_TRACT | Status: DC
  Administered 2017-01-31 – 2017-02-06 (×7): 18 ug via RESPIRATORY_TRACT
  Filled 2017-01-30 (×2): qty 5

## 2017-01-30 MED ORDER — DEXTROSE 5 % IV SOLN
1.0000 g | INTRAVENOUS | Status: DC
  Administered 2017-01-31: 1 g via INTRAVENOUS
  Filled 2017-01-30 (×2): qty 10

## 2017-01-30 MED ORDER — SODIUM CHLORIDE 0.9 % IV BOLUS (SEPSIS)
1000.0000 mL | Freq: Once | INTRAVENOUS | Status: AC
  Administered 2017-01-30: 1000 mL via INTRAVENOUS

## 2017-01-30 MED ORDER — SODIUM CHLORIDE 0.9 % IV SOLN: INTRAVENOUS | Status: DC

## 2017-01-30 MED ORDER — SODIUM CHLORIDE 0.9% FLUSH
3.0000 mL | Freq: Two times a day (BID) | INTRAVENOUS | Status: DC
  Administered 2017-01-30 – 2017-01-31 (×2): 3 mL via INTRAVENOUS

## 2017-01-30 MED ORDER — BUSPIRONE HCL 15 MG PO TABS
15.0000 mg | ORAL_TABLET | Freq: Two times a day (BID) | ORAL | Status: DC
Start: 2017-01-30 — End: 2017-02-06
  Administered 2017-01-30 – 2017-02-06 (×14): 15 mg via ORAL
  Filled 2017-01-30 (×14): qty 1

## 2017-01-30 MED ORDER — RIFAXIMIN 550 MG PO TABS
550.0000 mg | ORAL_TABLET | Freq: Two times a day (BID) | ORAL | Status: DC
  Administered 2017-01-30 – 2017-02-06 (×14): 550 mg via ORAL
  Filled 2017-01-30 (×14): qty 1

## 2017-01-30 NOTE — ED Notes (Signed)
Received callback from [place], pt not admitted under [facility]. Updated primary nurse Freida Busman regarding critical lab value.

## 2017-01-30 NOTE — ED Notes (Signed)
Pt called out while her daughter was on Pt's personal phone, on speaker phone. Pt's daughter explained that she is unable to get any information on her mother. Pt is XXX, with a password established. Pt's daughter stated a password, but is frustrated because [facility] will not acknowledge Pt's presence, still. Per Camera operator, I specifically asked Pt if she wanted her daughter to have access to her, while the daughter was no longer on the call. Pt requested her daughter informed. I informed Pt I was unable to verify with registration what the password was, and will not be able to do so at this time. Followed up with nursing, registrar and Charge Nurse.
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
## 2017-01-30 NOTE — ED Triage Notes (Signed)
Pt states she has COPD and is here with severe sore throat.  Pt is pale, oral mucous membranes dry and patient states she is so weak.  Pt is tachyneic and bp low in triage.  Pt is alert, but slow to answer questions.

## 2017-01-30 NOTE — ED Notes (Signed)
Critical care at bedside  

## 2017-01-30 NOTE — ED Notes (Signed)
I was present with [name] [name] when he asked patient is she wanted her daughter to have access to her.

## 2017-01-30 NOTE — H&P (Signed)
Date: 01/30/2017               Patient Name:  [name] [name] MRN: [id number]  DOB: Aug 27, 1961 Age / Sex: 56 y.o., female   PCP: Milagros Loll, MD         Medical Service: Internal Medicine Teaching Service         Attending Physician: Dr. Annia Belt, MD    First Contact: Dr. Heber Southmayd Pager: [phone]  Second Contact: Dr. Posey Pronto Pager: [phone]       After Hours (After 5p/  First Contact Pager: [phone]  weekends / holidays): Second Contact Pager: [phone]   Chief Complaint: Weakness  History of Present Illness: Ms. Nastassja Witkop is a 56 year old woman with history of alcoholic cirrhosis with known esophageal varices, hepatic encephalopathy, hepatitis C (treated) and COPD that presents to the ED for a 2 day history of sore throat.  She states that her niece had similar symptoms.  She has associated symptoms of weakness, productive green sputum cough and hoarseness.  She denies shortness of breath, chest pain or abdominal pain.  During the history patient is delayed in her speech but is able to respond to questions with time.   Patient was recently admitted on 01/09/17 for upper GI bleed due to varices in the setting of alcoholic cirrhosis with ascites.  Patient currently denies any hematemesis or blood in her stool.  Meds:  Current Meds  Medication Sig  . albuterol (PROVENTIL HFA;VENTOLIN HFA) 108 (90 Base) MCG/ACT inhaler Inhale 2 puffs into the lungs every 6 (six) hours as needed for wheezing or shortness of breath.  Marland Kitchen albuterol (PROVENTIL) (2.5 MG/3ML) 0.083% nebulizer solution Take 3 mLs (2.5 mg total) by nebulization every 6 (six) hours as needed for wheezing or shortness of breath.  . busPIRone (BUSPAR) 15 MG tablet 2  bid (Patient taking differently: Take 30 mg by mouth 2 (two) times daily. )  . diclofenac sodium (VOLTAREN) 1 % GEL Apply 4 g topically 4 (four) times daily.  Marland Kitchen docusate sodium (COLACE) 100 MG capsule Take 1 capsule (100 mg total) by mouth daily as  needed.  . doxepin (SINEQUAN) 25 MG capsule Take 1 capsule (25 mg total) by mouth at bedtime.  . furosemide (LASIX) 80 MG tablet Take 1 tablet (80 mg total) by mouth daily.  . hydrocortisone (ANUSOL-HC) 2.5 % rectal cream Apply rectally 2 times daily  . hydrOXYzine (ATARAX/VISTARIL) 25 MG tablet Take 1 tablet (25 mg total) by mouth 3 (three) times daily.  Marland Kitchen lactulose (CHRONULAC) 10 GM/15ML solution Take 30 mLs (20 g total) by mouth 3 (three) times daily.  Marland Kitchen LORazepam (ATIVAN) 1 MG tablet 1 qam  1  qpm (Patient taking differently: Take 1 mg by mouth 2 (two) times daily. )  . Melatonin 1 MG CAPS Take 1 mg by mouth at bedtime.  . mometasone-formoterol (DULERA) 200-5 MCG/ACT AERO Inhale 2 puffs into the lungs 2 (two) times daily.  . pantoprazole (PROTONIX) 40 MG tablet Take 1 tablet (40 mg total) by mouth 2 (two) times daily.  . phenol (CHLORASEPTIC) 1.4 % LIQD Use as directed 1 spray in the mouth or throat as needed for throat irritation / pain.  Marland Kitchen Potassium Chloride ER 20 MEQ TBCR Take 20 mEq by mouth 2 (two) times daily.  . rifaximin (XIFAXAN) 550 MG TABS tablet Take 1 tablet (550 mg total) by mouth 2 (two) times daily.  Marland Kitchen spironolactone (ALDACTONE) 100 MG tablet Take 2 tablets (200  mg total) by mouth daily.  Marland Kitchen tiotropium (SPIRIVA HANDIHALER) 18 MCG inhalation capsule Place 1 capsule (18 mcg total) into inhaler and inhale daily.  . traMADol (ULTRAM) 50 MG tablet Take 1 tablet (50 mg total) by mouth every 12 (twelve) hours as needed for severe pain.     Allergies: Allergies as of 01/30/2017 - Review Complete 01/30/2017  Allergen Reaction Noted  . Ibuprofen Other (See Comments) 02/26/2016  . Tylenol [acetaminophen] Other (See Comments) 03/29/2015  . Amitriptyline Other (See Comments) 01/22/2013   Past Medical History:  Diagnosis Date  . Adverse drug effect - [place] 03/07/2015  . Alcoholic cirrhosis ([place])   . Allergy   . Anxiety   . Arthritis   . cervical Cancer 1999  .  Cholelithiasis   . COPD (chronic obstructive pulmonary disease) ([place])   . Decompensated hepatic cirrhosis ([place]) 03/07/2015   Side effect of Vikera  . Depression   . Esophageal varices ([place])   . Esophagitis 2010  . Fatty liver   . Gastritis   . GERD (gastroesophageal reflux disease)   . Heart palpitations   . Hepatitis C    chronic hepatitis C and Steatohepatitis (hep grade 2, stage 2-3) per 05/13/08 liver biopsy  . Hypertension   . Obesity   . Portal hypertension ([place])   . Shortness of breath   . Splenomegaly   . Tubulovillous adenoma of colon   . Visual hallucination    since 09/2010/notes 10/30/2012    Family History:  Family History  Problem Relation Age of Onset  . Cervical cancer Sister   . Breast cancer Maternal Aunt   . Heart disease Father 52    died from MI  . Breast cancer Father   . Colon cancer Paternal Aunt 10  . Cervical cancer Daughter      Social History: Tobacco use: current smoker, occasional  Alcohol use: denies Illicit drug use: denies   Review of Systems: A complete ROS was negative except as per HPI.   Physical Exam: Blood pressure (!) 91/53, pulse 95, temperature 97.8 F (36.6 C), temperature source Oral, resp. rate 19, SpO2 100 %. Physical Exam  Constitutional:  Delayed speech  Neck: Normal range of motion. Neck supple.  Cardiovascular: Normal rate, regular rhythm and normal heart sounds.   Pulmonary/Chest: Effort normal and breath sounds normal. No respiratory distress. She has no wheezes. She has no rales.  Abdominal: Soft. Bowel sounds are normal. She exhibits no distension. There is no tenderness.  No fluid wave present  Musculoskeletal:  3+ pitting edema in lower extremities bilaterally  Neurological: No cranial nerve deficit.  Strength 5/5 in upper and lower extremities bilaterally Sensation intact No asterixis noted    EKG: Sinus tachycardia, no ST elevations or T wave inversion    CXR:  IMPRESSION:  Chronic interstitial  changes though superimposed atypical infection is possible. No focal consolidation.  BMET    Component Value Date/Time   NA 117 (LL) 01/30/2017 1354   NA 132 (L) 12/25/2016 1631   K 4.9 01/30/2017 1354   CL 88 (L) 01/30/2017 1354   CO2 16 (L) 01/30/2017 1354   GLUCOSE 179 (H) 01/30/2017 1354   BUN 17 01/30/2017 1354   BUN 27 (H) 12/25/2016 1631   CREATININE 1.06 (H) 01/30/2017 1354   CREATININE 0.59 05/11/2015 1433   CALCIUM 8.9 01/30/2017 1354   GFRNONAA 57 (L) 01/30/2017 1354   GFRNONAA >89 05/11/2015 1433   GFRAA >60 01/30/2017 1354   GFRAA >89 05/11/2015  1433   CBC    Component Value Date/Time   WBC 11.3 (H) 01/30/2017 1354   RBC 2.64 (L) 01/30/2017 1354   HGB 8.3 (L) 01/30/2017 1354   HCT 25.2 (L) 01/30/2017 1354   PLT 150 01/30/2017 1354   MCV 95.5 01/30/2017 1354   MCH 31.4 01/30/2017 1354   MCHC 32.9 01/30/2017 1354   RDW 16.0 (H) 01/30/2017 1354   LYMPHSABS 2.4 01/08/2017 1617   MONOABS 1.8 (H) 01/08/2017 1617   EOSABS 0.2 01/08/2017 1617   BASOSABS 0.2 (H) 01/08/2017 1617     Assessment & Plan by Problem: Active Problems:   Hyponatremia  Hyponatremia Patient is likely hyponatremic due to her cirrhosis. With history of diarrhea and diuretic use this likely contributed in lowering serum sodium levels.  Patient arrived to the ED hypotensive and tachycardic.  She received 1 L normal saline bolus in the ED.  Repeat BMET showed a sodium of 119.  Will hold fluids as correcting the sodium will not likely treat the hemodynamic abnormalities created by cirrhosis. -BMET - holding fluids   Alcoholic liver cirrhosis Meld score (26) 19% mortality rate in 90 days.  Patient has a history of hepatic encephalopathy and takes lactulose and rifaximin at home. Patient is oriented to person, place and time.  Patient states she had 3 episodes of diarrhea yesterday. She reports she did not have a bowel movement today. -Holding lactulose and restarting tomorrow  -Rifaximin    Lactic Acidosis Patient was hypotensive and tachycardic on arrival to ED with a lactic acid of 4.3. Patient may be volume depleted from diuresing and poor oral intake.  Patient received 1 L of normal saline in the ED.  After IV fluids patient's lactic acid lowered to 2.8. - Repeat lactic acid  Non anion gap metabolic acidosis Bicarb 16 and anion gap of 10.  Likely in the setting of diarrhea and spironalactone.   Cough Patient reports a 2 day history of cough and sore throat. Patient denies taking any medication for her symptoms. On exam no erythema or inflamed lymph nodes were noted.  No cervical lymph nodes were palpable.  Patient is afebrile with mild leukocytosis. -Chloraseptic spray -mucinex  -cbc in am  Hx of Hepatitis C 03/05/2015 HCV quantitative was not detected  Hypothyroidism TSH on 01/09/2017 was 0.7 and within normal limits. Patient takes Synthroid at home. -Continue home dose of Synthroid  COPD  -continue Spiriva, Dulera and albuterol  History of anxiety/depression  -continue buspar, holding doxepin   Dispo: Admit patient to Observation with expected length of stay less than 2 midnights.  Signed: Valinda Party, DO 01/30/2017, 6:06 PM  Pager: [phone]

## 2017-01-30 NOTE — Consult Note (Signed)
56 year old with hep C cirrhosis, COPD presented to the ED 4/5 with hoarseness for 3 days, generalized weakness and sore throat. She was noted to be mildly hypotensive with systolic blood pressure in the 90s, lactate was 4 and labs showed a sodium of 117, hence [facility] M consulted. She was noted to be mildly confused. She is a history of ascites and is maintained on Lasix and Aldactone, and a history of hepatic encephalopathy and takes lactulose 3 times daily  Chest x-ray showed mild interstitial infiltrates. On exam-awake and interactive, oriented 3, no asterixis, was able to provide entire history, S1-S2 normal, 2+ bipedal edema, no JVD, no pallor no icterus, soft nontender abdomen, decreased breath sounds bilateral  Labs showed normal BUN, hemoglobin low at 8.3 with a baseline, sodium of 117, platelets 150, WBC count 11.3  Impression/plan Lactic acidosis - unclear etiology, may simply be related to intravascular volume depletion and due to overdiuresis with low clearance due to liver disease or may be occult sepsis -Suggest 1 L normal 7 bolus followed by gentle hydration at 75 an hour for the next liter, repeat lactate after bolus to ensure that this is coming down. -We'll empirically cover with ceftriaxone for may require pneumonia and ascites  Hyponatremia may be related to over diuresis, use gentle hydration, no indication for hypertonic saline Would check bmet every 8 hours to ensure that sodium does not rise to some  She can be admitted to stepdown unit under [facility] service  Rigoberto Noel MD

## 2017-01-30 NOTE — ED Notes (Signed)
Date and time results received: 01/30/17 7:43 PM   Test: Sodium Critical Value: 119  Name of Provider Notified: Tylene Fantasia PA via text page  Orders Received? Or Actions Taken?: awaiting response

## 2017-01-30 NOTE — ED Notes (Signed)
Date and time results received: 01/30/17 8:06 PM  Test: Sodium Critical Value: 119  Name of Provider Notified: IM intern Lovena Le via text page  Orders Received? Or Actions Taken?: awaiting response

## 2017-01-30 NOTE — ED Notes (Signed)
pt unable to void at his time.

## 2017-01-30 NOTE — Consult Note (Signed)
Name: [name] [name] MRN: [id number] DOB: 05/07/1961    ADMISSION DATE:  01/30/2017 CONSULTATION DATE:  01/30/2017  REFERRING MD :  Dr. Kathrynn Humble EDP  CHIEF COMPLAINT:  Hyponatremia  HISTORY OF PRESENT ILLNESS:  56 year old female with PMH as below, which is significant for Cirrhosis (hepatitis C, s/p treatment, and alcohol abuse history), COPD, esophageal varices, and arthritis. She was recently admitted for upper GI bleed secondary to varices, which were banded. 4/5 she presented to [facility] ED with complaints or URI symptoms sore throat and hoarseness for about 3 days with associated fatigue. Workup in the emergency department was significant for Na 117. She was started on IVF hydration and PCCM was called for further evaluation.   SIGNIFICANT EVENTS  4/5 admit for hyponatremia  STUDIES:    PAST MEDICAL HISTORY :   has a past medical history of Adverse drug effect - Viekra Pak (03/07/2015); Alcoholic cirrhosis ([place]); Allergy; Anxiety; Arthritis; cervical Cancer (1999); Cholelithiasis; COPD (chronic obstructive pulmonary disease) ([place]); Decompensated hepatic cirrhosis ([place]) (03/07/2015); Depression; Esophageal varices ([place]); Esophagitis (2010); Fatty liver; Gastritis; GERD (gastroesophageal reflux disease); Heart palpitations; Hepatitis C; Hypertension; Obesity; Portal hypertension ([place]); Shortness of breath; Splenomegaly; Tubulovillous adenoma of colon; and Visual hallucination.  has a past surgical history that includes cervical cancer surgery; Abdominal hysterectomy (1999); Tubal ligation (1982); Esophagogastroduodenoscopy (10/18/2011); and Esophagogastroduodenoscopy (egd) with propofol (N/A, 01/09/2017). Prior to Admission medications   Medication Sig Start Date End Date Taking? Authorizing Provider  albuterol (PROVENTIL HFA;VENTOLIN HFA) 108 (90 Base) MCG/ACT inhaler Inhale 2 puffs into the lungs every 6 (six) hours as needed for wheezing or shortness of breath. 01/15/17  Yes Milagros Loll, MD  albuterol (PROVENTIL) (2.5 MG/3ML) 0.083% nebulizer solution Take 3 mLs (2.5 mg total) by nebulization every 6 (six) hours as needed for wheezing or shortness of breath. 01/15/17  Yes Milagros Loll, MD  busPIRone (BUSPAR) 15 MG tablet 2  bid Patient taking differently: Take 30 mg by mouth 2 (two) times daily.  01/24/17  Yes Norma Fredrickson, MD  diclofenac sodium (VOLTAREN) 1 % GEL Apply 4 g topically 4 (four) times daily. 01/15/17  Yes Milagros Loll, MD  docusate sodium (COLACE) 100 MG capsule Take 1 capsule (100 mg total) by mouth daily as needed. 01/20/17 01/20/18 Yes Norman Herrlich, MD  doxepin (SINEQUAN) 25 MG capsule Take 1 capsule (25 mg total) by mouth at bedtime. 01/24/17  Yes Norma Fredrickson, MD  furosemide (LASIX) 80 MG tablet Take 1 tablet (80 mg total) by mouth daily. 12/06/16  Yes Milagros Loll, MD  hydrocortisone (ANUSOL-HC) 2.5 % rectal cream Apply rectally 2 times daily 01/20/17 01/20/18 Yes Norman Herrlich, MD  hydrOXYzine (ATARAX/VISTARIL) 25 MG tablet Take 1 tablet (25 mg total) by mouth 3 (three) times daily. 01/24/17  Yes Norma Fredrickson, MD  lactulose (CHRONULAC) 10 GM/15ML solution Take 30 mLs (20 g total) by mouth 3 (three) times daily. 01/23/17  Yes Milagros Loll, MD  LORazepam (ATIVAN) 1 MG tablet 1 qam  1  qpm Patient taking differently: Take 1 mg by mouth 2 (two) times daily.  01/24/17  Yes Norma Fredrickson, MD  Melatonin 1 MG CAPS Take 1 mg by mouth at bedtime.   Yes Historical Provider, MD  mometasone-formoterol (DULERA) 200-5 MCG/ACT AERO Inhale 2 puffs into the lungs 2 (two) times daily. 10/04/16  Yes Milagros Loll, MD  pantoprazole (PROTONIX) 40 MG tablet Take 1 tablet (40 mg total) by mouth 2 (two) times daily.  01/20/17  Yes Norman Herrlich, MD  phenol (CHLORASEPTIC) 1.4 % LIQD Use as directed 1 spray in the mouth or throat as needed for throat irritation / pain. 01/20/17  Yes Norman Herrlich, MD  Potassium Chloride ER 20 MEQ TBCR Take 20 mEq by mouth 2 (two)  times daily. 12/06/16  Yes Milagros Loll, MD  rifaximin (XIFAXAN) 550 MG TABS tablet Take 1 tablet (550 mg total) by mouth 2 (two) times daily. 10/05/16  Yes Maryellen Pile, MD  spironolactone (ALDACTONE) 100 MG tablet Take 2 tablets (200 mg total) by mouth daily. 01/16/17  Yes Milagros Loll, MD  tiotropium (SPIRIVA HANDIHALER) 18 MCG inhalation capsule Place 1 capsule (18 mcg total) into inhaler and inhale daily. 01/15/17  Yes Milagros Loll, MD  traMADol (ULTRAM) 50 MG tablet Take 1 tablet (50 mg total) by mouth every 12 (twelve) hours as needed for severe pain. 12/25/16  Yes Milagros Loll, MD   Allergies  Allergen Reactions  . Ibuprofen Other (See Comments)    ulcers  . Tylenol [Acetaminophen] Other (See Comments)    2/2 liver damage   . Amitriptyline Other (See Comments)    hallucinations     FAMILY HISTORY:  family history includes Breast cancer in her father and maternal aunt; Cervical cancer in her daughter and sister; Colon cancer (age of onset: 33) in her paternal aunt; Heart disease (age of onset: 1) in her father. SOCIAL HISTORY:  reports that she has been smoking Cigarettes.  She has a 4.00 pack-year smoking history. She has never used smokeless tobacco. She reports that she does not drink alcohol or use drugs.  REVIEW OF SYSTEMS:   Bolds are positive  Constitutional: weight loss, gain, night sweats, fevers, chills, fatigue .  HEENT: headaches, Sore throat, sneezing, nasal congestion, post nasal drip, Difficulty swallowing, Tooth/dental problems, visual complaints visual changes, ear ache CV:  chest pain, radiates:,Orthopnea, PND, swelling in lower extremities, dizziness, palpitations, syncope.  GI  heartburn, indigestion, abdominal pain, nausea, vomiting, diarrhea, change in bowel habits, loss of appetite, bloody stools.  Resp: cough, productive: green sputum , hemoptysis, dyspnea, chest pain, pleuritic.  Skin: rash or itching or icterus GU: dysuria, change in color  of urine, urgency or frequency. flank pain, hematuria  MS: joint pain or swelling. decreased range of motion  Psych: change in mood or affect. depression or anxiety.  Neuro: difficulty with speech, weakness, numbness, ataxia   SUBJECTIVE:   VITAL SIGNS: Temp:  [97.8 F (36.6 C)] 97.8 F (36.6 C) (04/05 1345) Pulse Rate:  [91-103] 91 (04/05 1547) Resp:  [17-20] 19 (04/05 1547) BP: (86-98)/(50-61) 94/61 (04/05 1547) SpO2:  [98 %-100 %] 100 % (04/05 1547)  PHYSICAL EXAMINATION: General:  Obese female in NAD Neuro:  Somnolent but easily arouses, oriented x 3.  HEENT:  /AT, PERRL, no JVD Cardiovascular:  Mildly tachy, regular, no MRG Lungs:  Diminished Abdomen:  Soft, very mild tenderness, non-distended Musculoskeletal:  No acute deformity. Bilateral pedal edema.  Skin:  Grossly intact   Recent Labs Lab 01/30/17 1354  NA 117*  K 4.9  CL 88*  CO2 16*  BUN 17  CREATININE 1.06*  GLUCOSE 179*    Recent Labs Lab 01/30/17 1354  HGB 8.3*  HCT 25.2*  WBC 11.3*  PLT 150   Dg Chest 2 View  Result Date: 01/30/2017 CLINICAL DATA:  Sore throat, shortness of breath and cough. History of COPD, cirrhosis. EXAM: CHEST  2 VIEW COMPARISON:  Chest radiograph January 09, 2017 FINDINGS: Cardiomediastinal silhouette is normal, mildly calcified aortic knob. Diffuse interstitial prominence with mildly increased lung volumes. No pleural effusion or focal consolidation. No pneumothorax. Soft tissue planes and included osseous structures are nonsuspicious. Focal kyphosis thoracolumbar junction associated with moderate to severe chronic appearing compression fracture. Old RIGHT rib fracture deformities. IMPRESSION: Chronic interstitial changes though superimposed atypical infection is possible. No focal consolidation. Electronically Signed   By: Elon Alas M.D.   On: 01/30/2017 14:26    ASSESSMENT / PLAN:  Hyponatremia: suspect hypovolemic hyponatremia secondary to diuretic medications and  decreased oral intake due to sore throat/fatigue.   Recommendations: - Resuscitate with gentle infusion of normal saline. - Trend BMP with goal to increase Na by 1 every hour.  - Hold outpatient diuretics and lactulose temporarily  - SDU admission.  COPD without acute exacerbation - Continue home Dulera, Spiriva, PRN albuterol  Hepatic cirrhosis secondary to hepatitis C and remote alcohol history - Trend LFT - Assess ammonia  Esophageal varices - monitor  Georgann Housekeeper, AGACNP-BC [facility] Pulmonology/Critical Care Pager [phone] or [phone]  01/30/2017 4:48 PM

## 2017-01-30 NOTE — ED Notes (Signed)
Attempted to call report

## 2017-01-31 DIAGNOSIS — Z79899 Other long term (current) drug therapy: Secondary | ICD-10-CM

## 2017-01-31 DIAGNOSIS — Z7951 Long term (current) use of inhaled steroids: Secondary | ICD-10-CM

## 2017-01-31 DIAGNOSIS — Z8619 Personal history of other infectious and parasitic diseases: Secondary | ICD-10-CM

## 2017-01-31 DIAGNOSIS — L8990 Pressure ulcer of unspecified site, unspecified stage: Secondary | ICD-10-CM | POA: Insufficient documentation

## 2017-01-31 DIAGNOSIS — J449 Chronic obstructive pulmonary disease, unspecified: Secondary | ICD-10-CM

## 2017-01-31 DIAGNOSIS — R05 Cough: Secondary | ICD-10-CM

## 2017-01-31 DIAGNOSIS — K7030 Alcoholic cirrhosis of liver without ascites: Secondary | ICD-10-CM

## 2017-01-31 DIAGNOSIS — J029 Acute pharyngitis, unspecified: Secondary | ICD-10-CM

## 2017-01-31 DIAGNOSIS — F418 Other specified anxiety disorders: Secondary | ICD-10-CM

## 2017-01-31 DIAGNOSIS — E039 Hypothyroidism, unspecified: Secondary | ICD-10-CM

## 2017-01-31 LAB — BASIC METABOLIC PANEL
Anion gap: 12 (ref 5–15)
Anion gap: 10 (ref 5–15)
Anion gap: 12 (ref 5–15)
BUN: 16 mg/dL (ref 6–20)
BUN: 16 mg/dL (ref 6–20)
BUN: 16 mg/dL (ref 6–20)
CHLORIDE: 90 mmol/L — AB (ref 101–111)
CHLORIDE: 92 mmol/L — AB (ref 101–111)
CO2: 16 mmol/L — ABNORMAL LOW (ref 22–32)
CO2: 16 mmol/L — AB (ref 22–32)
CO2: 16 mmol/L — ABNORMAL LOW (ref 22–32)
CREATININE: 0.94 mg/dL (ref 0.44–1.00)
Calcium: 8.4 mg/dL — ABNORMAL LOW (ref 8.9–10.3)
Calcium: 8.2 mg/dL — ABNORMAL LOW (ref 8.9–10.3)
Calcium: 8.3 mg/dL — ABNORMAL LOW (ref 8.9–10.3)
Chloride: 92 mmol/L — ABNORMAL LOW (ref 101–111)
Creatinine, Ser: 0.97 mg/dL (ref 0.44–1.00)
Creatinine, Ser: 0.99 mg/dL (ref 0.44–1.00)
GFR calc Af Amer: >60 mL/min (ref >60)
GFR calc Af Amer: >60 mL/min (ref >60)
GFR calc Af Amer: >60 mL/min (ref >60)
GFR calc non Af Amer: >60 mL/min (ref >60)
GLUCOSE: 137 mg/dL — AB (ref 65–99)
GLUCOSE: 166 mg/dL — AB (ref 65–99)
GLUCOSE: 137 mg/dL — AB (ref 65–99)
POTASSIUM: 4.3 mmol/L (ref 3.5–5.1)
POTASSIUM: 4.3 mmol/L (ref 3.5–5.1)
POTASSIUM: 4.4 mmol/L (ref 3.5–5.1)
Sodium: 118 mmol/L — CL (ref 135–145)
Sodium: 118 mmol/L — CL (ref 135–145)
Sodium: 120 mmol/L — ABNORMAL LOW (ref 135–145)

## 2017-01-31 LAB — COMPREHENSIVE METABOLIC PANEL
ALT: 39 U/L (ref 14–54)
ANION GAP: 12 (ref 5–15)
AST: 55 U/L — ABNORMAL HIGH (ref 15–41)
Albumin: 2.5 g/dL — ABNORMAL LOW (ref 3.5–5.0)
Alkaline Phosphatase: 149 U/L — ABNORMAL HIGH (ref 38–126)
BUN: 15 mg/dL (ref 6–20)
CHLORIDE: 89 mmol/L — AB (ref 101–111)
CO2: 17 mmol/L — ABNORMAL LOW (ref 22–32)
Calcium: 8.7 mg/dL — ABNORMAL LOW (ref 8.9–10.3)
Creatinine, Ser: 0.96 mg/dL (ref 0.44–1.00)
GFR calc Af Amer: >60 mL/min (ref >60)
GLUCOSE: 121 mg/dL — AB (ref 65–99)
POTASSIUM: 4.4 mmol/L (ref 3.5–5.1)
SODIUM: 118 mmol/L — AB (ref 135–145)
Total Bilirubin: 2.6 mg/dL — ABNORMAL HIGH (ref 0.3–1.2)
Total Protein: 5.9 g/dL — ABNORMAL LOW (ref 6.5–8.1)

## 2017-01-31 LAB — SODIUM, URINE, RANDOM: SODIUM UR: 53 mmol/L

## 2017-01-31 LAB — APTT: APTT: 43 s — AB (ref 24–36)

## 2017-01-31 LAB — CBC
HEMATOCRIT: 24.9 % — AB (ref 36.0–46.0)
HEMOGLOBIN: 8.4 g/dL — AB (ref 12.0–15.0)
MCH: 31.7 pg (ref 26.0–34.0)
MCHC: 33.7 g/dL (ref 30.0–36.0)
MCV: 94 fL (ref 78.0–100.0)
Platelets: 123 10*3/uL — ABNORMAL LOW (ref 150–400)
RBC: 2.65 MIL/uL — ABNORMAL LOW (ref 3.87–5.11)
RDW: 16.1 % — AB (ref 11.5–15.5)
WBC: 8.5 10*3/uL (ref 4.0–10.5)

## 2017-01-31 LAB — LACTIC ACID, PLASMA
LACTIC ACID, VENOUS: 2.4 mmol/L — AB (ref 0.5–1.9)
Lactic Acid, Venous: 2.5 mmol/L (ref 0.5–1.9)

## 2017-01-31 LAB — T4, FREE: Free T4: 1.05 ng/dL (ref 0.61–1.12)

## 2017-01-31 LAB — TSH: TSH: 8.273 u[IU]/mL — ABNORMAL HIGH (ref 0.350–4.500)

## 2017-01-31 LAB — PROTIME-INR
INR: 1.29
Prothrombin Time: 16.2 seconds — ABNORMAL HIGH (ref 11.4–15.2)

## 2017-01-31 LAB — OSMOLALITY, URINE: Osmolality, Ur: 227 mOsm/kg — ABNORMAL LOW (ref 300–900)

## 2017-01-31 MED ORDER — TRAMADOL HCL 50 MG PO TABS
50.0000 mg | ORAL_TABLET | Freq: Once | ORAL | Status: AC
  Administered 2017-01-31: 50 mg via ORAL
  Filled 2017-01-31: qty 1

## 2017-01-31 MED ORDER — LACTULOSE 10 GM/15ML PO SOLN
20.0000 g | Freq: Three times a day (TID) | ORAL | Status: DC
  Administered 2017-01-31 – 2017-02-01 (×4): 20 g via ORAL
  Filled 2017-01-31 (×5): qty 30

## 2017-01-31 MED ORDER — SODIUM CHLORIDE 0.9 % IV SOLN: INTRAVENOUS | Status: DC

## 2017-01-31 MED ORDER — CHLORHEXIDINE GLUCONATE 0.12 % MT SOLN
15.0000 mL | Freq: Two times a day (BID) | OROMUCOSAL | Status: DC
  Administered 2017-01-31 – 2017-02-04 (×7): 15 mL via OROMUCOSAL
  Filled 2017-01-31 (×5): qty 15

## 2017-01-31 MED ORDER — SODIUM CHLORIDE 0.9 % IV SOLN
INTRAVENOUS | Status: DC
  Administered 2017-01-31: 12:00:00 via INTRAVENOUS

## 2017-01-31 MED ORDER — DOXEPIN HCL 25 MG PO CAPS
25.0000 mg | ORAL_CAPSULE | Freq: Every day | ORAL | Status: DC
  Administered 2017-01-31 – 2017-02-05 (×6): 25 mg via ORAL
  Filled 2017-01-31 (×6): qty 1

## 2017-01-31 MED ORDER — ORAL CARE MOUTH RINSE
15.0000 mL | Freq: Two times a day (BID) | OROMUCOSAL | Status: DC
  Administered 2017-02-01 – 2017-02-04 (×7): 15 mL via OROMUCOSAL

## 2017-01-31 NOTE — Progress Notes (Addendum)
01/31/2017 Central monitor called at 1726 patient had a 11 beat vtach. Patient was asymptomatic. Rn assess patient no c/o heart racing or chest pain. Dr Jenetta DownerConley Canal was notified at [facility] Internal  Medicine Teaching Service, no orders given. [facility] RN.

## 2017-01-31 NOTE — Progress Notes (Signed)
CRITICAL VALUE ALERT  Critical value received:  Lactic Acid 2.5  Date of notification:  01/31/2017  Time of notification:  8022  Critical value read back:Yes.    Nurse who received alert:  Rico Sheehan RN  MD notified (1st page):  internal medicine teaching service  Time of first page: 1700  MD notified (2nd page):  Time of second page:  Responding MD:  Dr Inda Castle  Time MD responded:  [phone]

## 2017-01-31 NOTE — Progress Notes (Signed)
Received critical lab results, sodium 118. Upon entering room, resident and attending doctors at bedside discussing sodium level with patient. The doctors were already aware at the time of the visit.

## 2017-01-31 NOTE — Progress Notes (Signed)
Making rounds and noticed pt diaphoretic, starring at ceiling. Vital signs stable. Checked blood sugar which read LOW. Gave 1 amp of D50 per protocol. 3 peanut butter crackers and orange juice also given. Patient alert and following commands. Rechecked blood sugar after 15 minutes and reading of 132 noted.             Sharel Behne RN
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
## 2017-01-31 NOTE — Progress Notes (Signed)
   Subjective: Patient was evaluated this morning on rounds. She denies shortness of breath, chest pain, abdominal pain. She states that her mouth feels dry and her throat feels raw.  Objective:  Vital signs in last 24 hours: Vitals:   01/31/17 0353 01/31/17 0448 01/31/17 0816 01/31/17 1233  BP: (!) 101/59  (!) 108/54 111/65  Pulse: (!) 102  (!) 105 (!) 106  Resp: 20  20 (!) 24  Temp: 97.7 F (36.5 C)  97.7 F (36.5 C) 97.7 F (36.5 C)  TempSrc: Oral  Oral Oral  SpO2: 92%  93% 97%  Weight:  163 lb 1.6 oz (74 kg)    Height:       Physical Exam  Constitutional: She is oriented to person, place, and time.  Cardiovascular: Normal rate, regular rhythm and normal heart sounds.  Exam reveals no gallop and no friction rub.   No murmur heard. Pulmonary/Chest: Breath sounds normal. No respiratory distress. She has no wheezes. She has no rales.  Abdominal: Soft. Bowel sounds are normal. She exhibits no distension. There is no tenderness.  No fluid wave present  Musculoskeletal:  2+ pitting edema in lower extremities bilaterally   Neurological: She is alert and oriented to person, place, and time.  No asterixis noted CN II-XII intact 5/5 motor strength in upper and lower extremities bilaterally  Skin: Skin is warm and dry.    Assessment/Plan:  Active Problems:   Hyponatremia   Pressure injury of skin  Hyponatremia Patient is likely hyponatremic due to her cirrhosis with history of diarrhea and diuretic use contributing.  Patient's mouth is dry and is likely volume down.  Will give gentle hydration for dehydration.  Correcting the hyponatremia will not likely treat the hemodynamic abnormalities created by cirrhosis but since her sodium is less than 120 will try and correct to a goal of not going higher than 124 in the next 24hrs.  Serum osmol was low and urine sodium high consistent with hypotonic hypovolemic hyponatremia will get a cortisol level in the morning.  -BMET - IV NS 75cc    -am cortisol level  Alcoholic liver cirrhosis Patient is oriented to person, place and time.  Patient's lactulose was held on admission and will restart today.   -lactulose -Rifaximin   Lactic Acidosis Lactic acid 2.8 - Repeat lactic acid  Non anion gap metabolic acidosis Bicarb 16 and anion gap of 10.  Likely in the setting of diarrhea and spironalactone.   Cough  Patient is afebrile with no leukocytosis.  Mild leukocytosis yesterday and patient received ceftriaxone.  Will continue ceftriaxone for now for possible CAP.  -mucinex  -cbc in am  Sore throat -Chloraseptic spray  Hx of Hepatitis C 03/05/2015 HCV quantitative was not detected  Hypothyroidism TSH on 01/09/2017 was 0.7 and within normal limits.  Do not see synthroid on her home medications on admission.    - check TSH  COPD  -continue Spiriva, Duleraand albuterol  History of anxiety/depression  -continue buspar and doxepin    Dispo: Anticipated discharge pending clinical improvement   Valinda Party, DO 01/31/2017, 2:18 PM Pager: [phone]

## 2017-01-31 NOTE — Progress Notes (Signed)
CRITICAL VALUE ALERT  Critical value received:  Sodium 118  Date of notification:  01/31/2017  Time of notification:  8241  Critical value read back:Yes.    Nurse who received alert:  Rico Sheehan  MD notified (1st page):  Internal Medicine teaching Service  Time of first page:  1655  MD notified (2nd page):  Time of second page:  Responding MD:  Dr Inda Castle   Time MD responded:  1700

## 2017-01-31 NOTE — Progress Notes (Signed)
CRITICAL VALUE ALERT  Critical value received:  Lactic Acid 2.4  Date of notification:  01/31/2017  Time of notification:  2138  Critical value read back:Yes.    Nurse who received alert:  Brien Mates RN  MD notified (1st page):  [facility] coverage Lovena Le)  Time of first page:  2140  MD notified (2nd page):  Time of second page:  Responding MD:  Lovena Le MD  Time MD responded:  2145

## 2017-02-01 LAB — BASIC METABOLIC PANEL
ANION GAP: 14 (ref 5–15)
ANION GAP: 11 (ref 5–15)
ANION GAP: 11 (ref 5–15)
Anion gap: 12 (ref 5–15)
Anion gap: 10 (ref 5–15)
BUN: 14 mg/dL (ref 6–20)
BUN: 14 mg/dL (ref 6–20)
BUN: 13 mg/dL (ref 6–20)
BUN: 13 mg/dL (ref 6–20)
BUN: 13 mg/dL (ref 6–20)
CALCIUM: 8.5 mg/dL — AB (ref 8.9–10.3)
CHLORIDE: 92 mmol/L — AB (ref 101–111)
CHLORIDE: 94 mmol/L — AB (ref 101–111)
CHLORIDE: 93 mmol/L — AB (ref 101–111)
CO2: 16 mmol/L — AB (ref 22–32)
CO2: 14 mmol/L — AB (ref 22–32)
CO2: 15 mmol/L — AB (ref 22–32)
CO2: 15 mmol/L — AB (ref 22–32)
CO2: 15 mmol/L — AB (ref 22–32)
Calcium: 8.3 mg/dL — ABNORMAL LOW (ref 8.9–10.3)
Calcium: 8.2 mg/dL — ABNORMAL LOW (ref 8.9–10.3)
Calcium: 8.4 mg/dL — ABNORMAL LOW (ref 8.9–10.3)
Calcium: 8.4 mg/dL — ABNORMAL LOW (ref 8.9–10.3)
Chloride: 92 mmol/L — ABNORMAL LOW (ref 101–111)
Chloride: 93 mmol/L — ABNORMAL LOW (ref 101–111)
Creatinine, Ser: 0.85 mg/dL (ref 0.44–1.00)
Creatinine, Ser: 0.85 mg/dL (ref 0.44–1.00)
Creatinine, Ser: 0.85 mg/dL (ref 0.44–1.00)
Creatinine, Ser: 0.83 mg/dL (ref 0.44–1.00)
Creatinine, Ser: 0.85 mg/dL (ref 0.44–1.00)
GFR calc Af Amer: >60 mL/min (ref >60)
GFR calc Af Amer: >60 mL/min (ref >60)
GFR calc Af Amer: >60 mL/min (ref >60)
GFR calc Af Amer: >60 mL/min (ref >60)
GFR calc Af Amer: >60 mL/min (ref >60)
GFR calc non Af Amer: >60 mL/min (ref >60)
GFR calc non Af Amer: >60 mL/min (ref >60)
GFR calc non Af Amer: >60 mL/min (ref >60)
GFR calc non Af Amer: >60 mL/min (ref >60)
GLUCOSE: 146 mg/dL — AB (ref 65–99)
GLUCOSE: 141 mg/dL — AB (ref 65–99)
GLUCOSE: 184 mg/dL — AB (ref 65–99)
GLUCOSE: 146 mg/dL — AB (ref 65–99)
GLUCOSE: 144 mg/dL — AB (ref 65–99)
POTASSIUM: 4 mmol/L (ref 3.5–5.1)
POTASSIUM: 4.1 mmol/L (ref 3.5–5.1)
POTASSIUM: 4.1 mmol/L (ref 3.5–5.1)
Potassium: 4.2 mmol/L (ref 3.5–5.1)
Potassium: 4 mmol/L (ref 3.5–5.1)
Sodium: 120 mmol/L — ABNORMAL LOW (ref 135–145)
Sodium: 120 mmol/L — ABNORMAL LOW (ref 135–145)
Sodium: 118 mmol/L — CL (ref 135–145)
Sodium: 120 mmol/L — ABNORMAL LOW (ref 135–145)
Sodium: 119 mmol/L — CL (ref 135–145)

## 2017-02-01 LAB — CORTISOL-AM, BLOOD: Cortisol - AM: 6.7 ug/dL (ref 6.7–22.6)

## 2017-02-01 LAB — CBC
HCT: 25.2 % — ABNORMAL LOW (ref 36.0–46.0)
Hemoglobin: 8.3 g/dL — ABNORMAL LOW (ref 12.0–15.0)
MCH: 31.3 pg (ref 26.0–34.0)
MCHC: 32.9 g/dL (ref 30.0–36.0)
MCV: 95.1 fL (ref 78.0–100.0)
PLATELETS: 138 10*3/uL — AB (ref 150–400)
RBC: 2.65 MIL/uL — AB (ref 3.87–5.11)
RDW: 16.3 % — ABNORMAL HIGH (ref 11.5–15.5)
WBC: 8.5 10*3/uL (ref 4.0–10.5)

## 2017-02-01 LAB — LACTIC ACID, PLASMA: Lactic Acid, Venous: 2.1 mmol/L (ref 0.5–1.9)

## 2017-02-01 MED ORDER — SENNA 8.6 MG PO TABS
1.0000 | ORAL_TABLET | Freq: Every day | ORAL | Status: DC | PRN
  Administered 2017-02-01: 17.2 mg via ORAL
  Filled 2017-02-01: qty 2

## 2017-02-01 MED ORDER — BISACODYL 10 MG RE SUPP
10.0000 mg | Freq: Once | RECTAL | Status: AC | PRN
  Administered 2017-02-01: 10 mg via RECTAL
  Filled 2017-02-01: qty 1

## 2017-02-01 MED ORDER — TRAMADOL HCL 50 MG PO TABS
50.0000 mg | ORAL_TABLET | Freq: Two times a day (BID) | ORAL | Status: DC | PRN
  Administered 2017-02-01 – 2017-02-06 (×5): 50 mg via ORAL
  Filled 2017-02-01 (×5): qty 1

## 2017-02-01 MED ORDER — LACTULOSE 10 GM/15ML PO SOLN
20.0000 g | Freq: Four times a day (QID) | ORAL | Status: DC
  Administered 2017-02-01 – 2017-02-06 (×20): 20 g via ORAL
  Filled 2017-02-01 (×20): qty 30

## 2017-02-01 NOTE — Progress Notes (Addendum)
   Subjective:  Patient is starting to feel better this morning. She is alert and oriented to person, place, and time. She complains of some fatigue. She has not had a bowel movement. She denies feeling any fevers. She says her leg swelling is at her usual baseline.  Objective:  Vital signs in last 24 hours: Vitals:   02/01/17 0510 02/01/17 0804 02/01/17 0907 02/01/17 1126  BP:  105/65  118/75  Pulse: 97     Resp: 17     Temp:  98.1 F (36.7 C)  98.2 F (36.8 C)  TempSrc:  Oral  Axillary  SpO2: 91%  91%   Weight: 165 lb 12.6 oz (75.2 kg)     Height:       General: sitting up in a chair, no acute distress HEENT: no scleral icterus Cardiac: RRR, no rubs, murmurs or gallops Pulm: clear to auscultation bilaterally, moving normal volumes of air Abd: soft, slightly distended, not tense, BS present Ext: +1 pedal edema bilaterally Neuro: alert and oriented X3, no asterixis   Assessment/Plan:  Principal Problem:   Hyponatremia Active Problems:   Chronic obstructive bronchitis (HCC)   Essential hypertension, benign   Cirrhosis (HCC)   Pressure injury of skin  Hyponatremia: Patient is likely hyponatremic due to her cirrhosis with history of diarrhea and diuretic use contributing. Patient is likely intravasularly depleted.  Correcting the hyponatremia will not likely treat the hemodynamic abnormalities created by cirrhosis but since her sodium is less than 120 will try and correct to a goal of not going higher than 124 in the next 24hrs.  Serum osmol was low and urine sodium high consistent with hypotonic hypovolemic hyponatremia. AM Cortisol is borderline at 6.7, obtain stim test. Na trend today is 120 > 118. Will increase fluid rate with close monitoring of Na level and volume status.  - Monitor BMET q8h - Increase IV NS to 100 mL/hr, monitor volume status - Daily weights, Strict I/Os - Goal Na 122-124 over next 24 hours, hold fluids if above goal and consider free  water  Alcoholic liver cirrhosis: Patient is oriented to person, place and time. No sign of hepatic encephalopathy. Has not had a bowel movement. - Increase Lactulose 20 g po to 4 times daily; titrate to 2-3 BMs per day - Rifaximin  550 mg BID - Holding home Lasix and Spironolactone  Cough  Patient with mild leukocytosis on admission, on ceftriaxone for possible PNA. She is afebrile and CXR without consolidation. Have low suspicion for PNA at this time. Will discontinue ceftriaxone. - Mucinex  - Chloraseptic spray  Subclinical Hypothyroidism TSH on 01/09/2017 was 0.7 and within normal limits. Thyroid studies this admission suggest subclinical hypothyroidism. she was previously on hormone replacement at 50 mcg which was discontinued on her last admission as her TFTs did not indicate hypothyroidism. Overt hypothyroidism could contribute to hyponatremia. - TSH 8.273, free T4 1.05  COPD  - Continue Spiriva, Duleraand albuterol  History of anxiety/depression  - Continuebuspar and doxepin   Dispo: Anticipated discharge in approximately 1-3 day(s).   Zada Finders, MD 02/01/2017, 12:38 PM

## 2017-02-01 NOTE — Progress Notes (Signed)
CRITICAL VALUE ALERT  Critical value received:  Sodium 118  Date of notification:  02/01/17  Time of notification:  3154  Critical value read back: Yes  Nurse who received alert:  Leonie Man RN  MD notified (1st page):  Guilloud MD  Time of first page:  1047  MD notified (2nd page):  Time of second page:  Responding MD:  Philipp Ovens MD  Time MD responded: 0086

## 2017-02-02 ENCOUNTER — Inpatient Hospital Stay (HOSPITAL_COMMUNITY): Payer: Medicaid Other

## 2017-02-02 LAB — CBC
HCT: 23.3 % — ABNORMAL LOW (ref 36.0–46.0)
HEMOGLOBIN: 7.9 g/dL — AB (ref 12.0–15.0)
MCH: 31.6 pg (ref 26.0–34.0)
MCHC: 33.9 g/dL (ref 30.0–36.0)
MCV: 93.2 fL (ref 78.0–100.0)
Platelets: 122 10*3/uL — ABNORMAL LOW (ref 150–400)
RBC: 2.5 MIL/uL — ABNORMAL LOW (ref 3.87–5.11)
RDW: 15.7 % — AB (ref 11.5–15.5)
WBC: 9.7 10*3/uL (ref 4.0–10.5)

## 2017-02-02 LAB — ACTH STIMULATION, 3 TIME POINTS
Cortisol, 30 Min: 18.8 ug/dL
Cortisol, 60 Min: 20.3 ug/dL
Cortisol, Base: 13.2 ug/dL

## 2017-02-02 LAB — BASIC METABOLIC PANEL
ANION GAP: 9 (ref 5–15)
Anion gap: 12 (ref 5–15)
BUN: 11 mg/dL (ref 6–20)
BUN: 13 mg/dL (ref 6–20)
CHLORIDE: 94 mmol/L — AB (ref 101–111)
CHLORIDE: 94 mmol/L — AB (ref 101–111)
CO2: 15 mmol/L — AB (ref 22–32)
CO2: 13 mmol/L — AB (ref 22–32)
CREATININE: 0.75 mg/dL (ref 0.44–1.00)
Calcium: 8 mg/dL — ABNORMAL LOW (ref 8.9–10.3)
Calcium: 8.6 mg/dL — ABNORMAL LOW (ref 8.9–10.3)
Creatinine, Ser: 0.75 mg/dL (ref 0.44–1.00)
GFR calc Af Amer: >60 mL/min (ref >60)
GFR calc non Af Amer: >60 mL/min (ref >60)
GLUCOSE: 128 mg/dL — AB (ref 65–99)
GLUCOSE: 147 mg/dL — AB (ref 65–99)
POTASSIUM: 4 mmol/L (ref 3.5–5.1)
Potassium: 4.4 mmol/L (ref 3.5–5.1)
Sodium: 118 mmol/L — CL (ref 135–145)
Sodium: 119 mmol/L — CL (ref 135–145)

## 2017-02-02 MED ORDER — OXYCODONE HCL 5 MG PO TABS
5.0000 mg | ORAL_TABLET | ORAL | Status: DC | PRN
  Administered 2017-02-02: 5 mg via ORAL
  Filled 2017-02-02: qty 1

## 2017-02-02 MED ORDER — LACTULOSE ENEMA: 300.0000 mL | Freq: Once | ORAL | Status: DC

## 2017-02-02 MED ORDER — COSYNTROPIN 0.25 MG IJ SOLR
0.2500 mg | Freq: Once | INTRAMUSCULAR | Status: AC
  Administered 2017-02-02: 0.25 mg via INTRAVENOUS
  Filled 2017-02-02: qty 0.25

## 2017-02-02 MED ORDER — LACTULOSE 10 GM/15ML PO SOLN
30.0000 g | Freq: Once | ORAL | Status: AC
  Administered 2017-02-02: 30 g via ORAL
  Filled 2017-02-02: qty 45

## 2017-02-02 NOTE — Progress Notes (Signed)
SUBJECTIVE Went to evaluate [name] after page by RN for new abdominal pain. Pt was lying in bed in mild distress complaining of distention of her abdomen and left sided abd pain. Reports that belly swelling progressed through the night. Was given scheduled tramadol without relief. c/o mild amount of difficulty with catching breath 2/2 swelling.  Pt was receiving 100cc/hr NS for hyponatremia. Last check at 2130, Na 119. Pt is now 3L net up in terms of I/Os for admission.  OBJECTIVE Vitals:   02/01/17 1952 02/01/17 2355  BP: 131/84 113/64  Pulse: (!) 112 (!) 105  Resp: 20 (!) 21  Temp: 97.4 F (36.3 C) 97.9 F (36.6 C)   Physical Exam  Constitutional: She is oriented to person, place, and time. She appears lethargic (mildly). She appears distressed (mild).  Cardiovascular: Tachycardia present.   Pulmonary/Chest: No respiratory distress. She has no wheezes. She has no rales.  Mild increased RR w/o significant increase in WOB.  Abdominal: She exhibits distension, fluid wave and ascites. Bowel sounds are not hypoactive. There is tenderness in the right upper quadrant and right lower quadrant.  Neurological: She is oriented to person, place, and time. She appears lethargic (mildly). She displays tremor (BUE, several beats of asterixis).   ASSESSMENT Pt has worsening ascites 2/2 intravenous infusion for hyponatremia. No signs of acute infection. No respiratory distress currently, but concerns for progression with continued IVF. Also note that pt has mild amount of hepatic encephalopathy with asterixis. Likely not far from baseline, but was more clear on my last encounter with the patient in March. Pt denies having any BM is > 1 day.  PLAN - Discontinue IVF. Will need alternative strategy to address hyponatremia such as salt tabs. ACTH stim test was cancelled and reordered for 7am this morning d/t instruction from the laboratory. 3am BMP is currently drawn and pending, will f/u but plan to  hold new IVF until the morning to discuss with primary team unless significantly lower. - Should plan for therapeutic paracentesis tomorrow - Address pain with oxy IR 5mg  q4h, RN to note mental status carefully and call MD with any significant changes - increase lactulose to address hepatic encephalopathy

## 2017-02-02 NOTE — Progress Notes (Signed)
Subjective:  Patient seen by night team overnight due to new complaint of abdominal pain. Abdomen was noted to be distended with increased swelling compared to earlier in the day. She has been on IVF in attempts to slowly correct her hyponatremia. Fluids have been discontinued overnight. Lactulose frequency was increased yesterday. Per nursing, patient had several bowel movements yesterday. This morning, patient is laying in bed appearing uncomfortable. Her abdomen is more swollen compared to my exam yesterday. She is having some shortness of breath and requiring supplemental O2 via Zap. I spoke to her brother on the phone and updated him on the patient's current status. I also spoke to him regarding her goals of care. Based on discussions with the patient and family, they have stated that she does not have a DNR and they want to pursue all measures to keep her alive, including chest compressions, intubation, and ACLS protocol despite likely poor outcomes.  Objective:  Vital signs in last 24 hours: Vitals:   02/02/17 0500 02/02/17 0505 02/02/17 0834 02/02/17 0844  BP:  134/85 135/80   Pulse:  (!) 108 98   Resp:  20 16   Temp: 98.1 F (36.7 C)  98.1 F (36.7 C)   TempSrc: Oral  Oral   SpO2:  (!) 89% 92% 94%  Weight: 173 lb 15.1 oz (78.9 kg)     Height:       General: sitting up in a chair, no acute distress HEENT: no scleral icterus Cardiac: tachy, no rubs, murmurs or gallops Pulm: decreased breath sounds at the bases, increased respiratory rate,  Abd: soft, distended, not tense, BS present Ext: +1-2 pedal edema bilaterally Neuro: alert and oriented X3   Assessment/Plan:  Principal Problem:   Hyponatremia Active Problems:   Chronic obstructive bronchitis (HCC)   Essential hypertension, benign   Cirrhosis (HCC)   Pressure injury of skin  Hyponatremia: Patient is likely hyponatremic due to her cirrhosis with history of diarrhea and diuretic use contributing. Patient is likely  intravasularly depleted however extravasating fluid due to her cirrhosis. Correcting the hyponatremia will not likely treat the hemodynamic abnormalities created by cirrhosis but since her sodium was less than 120, attempts to slowly correct by 4-6 mmol/L were initiatied. Serum osmol was low and urine sodium were high consistent with hypotonic hypovolemic hyponatremia. Unfortunately, patient's volume status is tenuous and fluids have exacerbated her underlying chronic cirrhosis and ascites. Given her cirrhosis and poor candidacy for liver transplant (MELD score 24, "poor candidate" per prior Transplant note), I do not think we will be able to safely correct her sodium as fluids will worsen her ascites, salt tablets should be avoided in edematous patients, and Vasopressin Antagonists should be avoided given her esophageal varices. - Monitor BMET q8h - Off IVF, monitor volume status - Daily weights, Strict I/Os   Alcoholic Liver Cirrhosis: Increased abdominal swelling, dyspnea, and O2 requirement overnight. She apparently had several bowel movements yesterday per nursing. At this time, I would favor therapeutic paracentesis for short-term comfort. Long-term prognosis is poor and I did speak with the patient and her family regarding her goals of care. They have stated that she does not have a DNR and they want to pursue all measures to keep her alive, including chest compressions, intubation, and ACLS protocol despite likely poor outcomes. - Lactulose 20 g po to 4 times daily; titrate to 2-3 BMs per day - Rifaximin  550 mg BID - Holding home Lasix and Spironolactone - US Paracentesis  Subclinical Hypothyroidism  TSH on 01/09/2017 was 0.7 and within normal limits. Thyroid studies this admission suggest subclinical hypothyroidism. she was previously on hormone replacement at 50 mcg which was discontinued on her last admission as her TFTs did not indicate hypothyroidism. Overt hypothyroidism could contribute  to hyponatremia. - TSH 8.273, free T4 1.05  COPD  - Continue Spiriva, Duleraand albuterol  History of anxiety/depression  - Continuebuspar and doxepin   Dispo: Anticipated discharge in approximately 1-3 day(s).   Zada Finders, MD 02/02/2017, 11:13 AM

## 2017-02-02 NOTE — Progress Notes (Signed)
CRITICAL VALUE ALERT  Critical value received:  Sodium 119  Date of notification: 02/02/2017  Time of notification:  5465  Critical value read back:Yes  Nurse who received alert:  Leonie Man  MD notified (1st page):  Asencion Partridge MD  Time of first page:  1242  MD notified (2nd page):  Time of second page:  Responding MD:  Asencion Partridge MD  Time MD responded:  [phone]

## 2017-02-03 ENCOUNTER — Inpatient Hospital Stay (HOSPITAL_COMMUNITY): Payer: Medicaid Other

## 2017-02-03 DIAGNOSIS — Z7189 Other specified counseling: Secondary | ICD-10-CM

## 2017-02-03 DIAGNOSIS — Z515 Encounter for palliative care: Secondary | ICD-10-CM

## 2017-02-03 DIAGNOSIS — F411 Generalized anxiety disorder: Secondary | ICD-10-CM

## 2017-02-03 DIAGNOSIS — K7031 Alcoholic cirrhosis of liver with ascites: Secondary | ICD-10-CM

## 2017-02-03 LAB — BASIC METABOLIC PANEL
Anion gap: 9 (ref 5–15)
BUN: 15 mg/dL (ref 6–20)
CALCIUM: 8.5 mg/dL — AB (ref 8.9–10.3)
CO2: 15 mmol/L — AB (ref 22–32)
CREATININE: 0.75 mg/dL (ref 0.44–1.00)
Chloride: 94 mmol/L — ABNORMAL LOW (ref 101–111)
GFR calc Af Amer: >60 mL/min (ref >60)
GFR calc non Af Amer: >60 mL/min (ref >60)
GLUCOSE: 122 mg/dL — AB (ref 65–99)
Potassium: 3.9 mmol/L (ref 3.5–5.1)
Sodium: 118 mmol/L — CL (ref 135–145)

## 2017-02-03 MED ORDER — LORAZEPAM 1 MG PO TABS
1.0000 mg | ORAL_TABLET | Freq: Two times a day (BID) | ORAL | Status: DC
  Administered 2017-02-03 – 2017-02-06 (×7): 1 mg via ORAL
  Filled 2017-02-03 (×7): qty 1

## 2017-02-03 MED ORDER — WHITE PETROLATUM GEL
Status: AC
  Administered 2017-02-03: 1
  Filled 2017-02-03: qty 1

## 2017-02-03 NOTE — Progress Notes (Signed)
Medicine attending: I examined this patient today together with resident physician Dr. Kalman Shan and I concur with her evaluation and management plan which we discussed together.  Details to follow in her progress note. She is more comfortable today.  Abdomen remains distended but soft.  Not tense.  I do not appreciate a fluid wave.  We had planned to do a paracentesis but when she went to the radiology department, there was not enough ascites present to need the procedure. We have made no progress with respect to her hyponatremia which remains low at 118-120. Although there is likely a major component related to her third space fluids from her cirrhosis, we suspect an additional component.  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  There is also a question on how much her thyroid function is contributing with persistent elevation of TSH.   We are going to repeat urine osmolarity and sodium now that she has been off diuretics for more accurate estimate of urine sodium.  If we still suspect a component of SIADH, we will then fluid restrict to see if that improves her sodium. I discussed with her this morning that we would like her to think about what she wants to do or not do with respect to emergency situations that might result in necessity for advanced life support measures.  We will ask palliative care to consult to review with her and her family her goals of care going forward.

## 2017-02-03 NOTE — Progress Notes (Signed)
Minimal ascites, no enough to perform paracentesis.  [name] [name] E 11:49 AM 02/03/2017

## 2017-02-03 NOTE — Progress Notes (Signed)
   Subjective: Patient was evaluated this morning. She denies any abdominal pain or discomfort. She reports that her sore throat is improving.  Objective:  Vital signs in last 24 hours: Vitals:   02/03/17 0731 02/03/17 0832 02/03/17 0840 02/03/17 1256  BP:      Pulse:      Resp:      Temp:  98.1 F (36.7 C)  97.9 F (36.6 C)  TempSrc:  Oral  Oral  SpO2:   94%   Weight: 161 lb 2.5 oz (73.1 kg)     Height:       Physical Exam  Constitutional: She is oriented to person, place, and time.  Sitting up in recliner chair  Cardiovascular: Normal rate, regular rhythm and normal heart sounds.  Exam reveals no gallop and no friction rub.   No murmur heard. Pulmonary/Chest: Effort normal and breath sounds normal. No respiratory distress. She has no wheezes. She has no rales.  Abdominal: Soft. Bowel sounds are normal. She exhibits no distension and no mass. There is no tenderness. There is no rebound and no guarding.  Musculoskeletal:  3+ pitting edema on lower extremities bilaterally   Neurological: She is alert and oriented to person, place, and time.  Mild asterixis   Skin: Skin is warm and dry.    Assessment/Plan:  Principal Problem:   Hyponatremia Active Problems:   Chronic obstructive bronchitis (HCC)   Essential hypertension, benign   Cirrhosis (HCC)   Pressure injury of skin  Hyponatremia: Patient is likely hyponatremic due to her cirrhosis with history of diarrhea and diuretic use contributing.  Patient is extravasating fluid due to her cirrhosis.  Despite IV fluids patient's serum sodium did not increase.  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  Therefore would treat similarly with fluid restriction.  Patient's further decrease hyponatremia prior to admission could be a progression of her cirrhosis and we have asked palliative care to consult for  goals of care. - Monitor BMET  - Off IVF, monitor volume status - Daily weights, Strict I/Os - Palliative care consult  Alcoholic Liver Cirrhosis: Patient had abdominal discomfort over the weekend.  Patient denies abdominal pain today.  Patient was scheduled for paracentesis however not enough ascites was appreciated for the the procedure.     - Lactulose 20 g po to 4 times daily; titrate to 2-3 BMs per day - Rifaximin  550 mg BID - Holding home Lasix and Spironolactone  Subclinical Hypothyroidism TSH on 01/09/2017 was 0.7 and within normal limits.  On 11/28/2016 patients TSH was obtained and was elevated to 20.583 however free T4 was within normal limits thus patient was not hypothyroid.  TSH can also be skewed in an acutely ill setting. Patient however, was started on synthroid and lab work then showed TSH was 0.770 and free T4 was 1.32 thus patient became hyperthyroid from medication use.  Patient has not been on synthroid and patient's TSH and free T4 do not suggest hypo or hyper thyroid.         COPD  - Continue Spiriva, Duleraand albuterol  History of anxiety/depression  - Continuebuspar and doxepin   Dispo: Anticipated discharge pending clinical improvement.   Valinda Party, DO 02/03/2017, 1:24 PM Pager: [phone]
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
## 2017-02-04 LAB — CULTURE, BLOOD (ROUTINE X 2)
CULTURE: NO GROWTH
CULTURE: NO GROWTH
SPECIAL REQUESTS: ADEQUATE
SPECIAL REQUESTS: ADEQUATE

## 2017-02-04 LAB — BASIC METABOLIC PANEL
Anion gap: 8 (ref 5–15)
BUN: 9 mg/dL (ref 6–20)
CALCIUM: 8.4 mg/dL — AB (ref 8.9–10.3)
CO2: 16 mmol/L — AB (ref 22–32)
CREATININE: 0.67 mg/dL (ref 0.44–1.00)
Chloride: 94 mmol/L — ABNORMAL LOW (ref 101–111)
GFR calc Af Amer: >60 mL/min (ref >60)
GFR calc non Af Amer: >60 mL/min (ref >60)
GLUCOSE: 91 mg/dL (ref 65–99)
Potassium: 3.7 mmol/L (ref 3.5–5.1)
Sodium: 118 mmol/L — CL (ref 135–145)

## 2017-02-04 LAB — ALBUMIN: Albumin: 2.3 g/dL — ABNORMAL LOW (ref 3.5–5.0)

## 2017-02-04 NOTE — Progress Notes (Signed)
No charge note:  Palliative Medicine- brief note  Family meeting scheduled with patient's daughter for 4/11 at [facility], AGNP-C Palliative Medicine  Please call Palliative Medicine team phone with any questions [phone]. For individual providers please see AMION.
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
## 2017-02-04 NOTE — Plan of Care (Signed)
Problem: Physical Regulation: Goal: Ability to maintain clinical measurements within normal limits will improve Outcome: Progressing Patient Na level remains low at 118. Addition of Ativan back to patient's medication list has led to a better night for the patient. She was able to get rest. She is back to wearing her baseline 2 L of O2 most of day. Water bottle added for comfort.   Problem: Skin Integrity: Goal: Risk for impaired skin integrity will decrease Outcome: Progressing Vaseline being used on patient's lips as they are very dry and cracked. She states it helps. Foam still on bottom Stg. 2.  Problem: Nutrition: Goal: Adequate nutrition will be maintained Outcome: Progressing Patient does not have a great appetite and is not eating very much of her meals.

## 2017-02-04 NOTE — Progress Notes (Signed)
   Subjective: Patient was evaluated this morning on rounds. She is sitting up eating breakfast. She reports continued improvement in her symptoms. She denies shortness of breath, chest pain or abdominal pain.  Objective:  Vital signs in last 24 hours: Vitals:   02/04/17 0500 02/04/17 0725 02/04/17 0913 02/04/17 1059  BP:      Pulse:  (!) 112  (!) 102  Resp:  (!) 22  18  Temp:  98.3 F (36.8 C)  98.2 F (36.8 C)  TempSrc:    Oral  SpO2:  93% 100% 100%  Weight: 169 lb 8.5 oz (76.9 kg)     Height:       Physical Exam  Cardiovascular: Normal rate, regular rhythm and normal heart sounds.  Exam reveals no gallop and no friction rub.   No murmur heard. Pulmonary/Chest: Effort normal and breath sounds normal. No respiratory distress. She has no wheezes. She has no rales.  Abdominal: Soft. She exhibits no distension. There is no tenderness.  Musculoskeletal: She exhibits no edema.  Neurological: She is alert.    Assessment/Plan:  Principal Problem:   Hyponatremia Active Problems:   Chronic obstructive bronchitis (HCC)   Essential hypertension, benign   Cirrhosis (HCC)   Pressure injury of skin   Goals of care, counseling/discussion   Advance care planning   Palliative care by specialist  Hyponatremia: Patient is likely hyponatremic due to her cirrhosis with history of diarrhea and diuretic use contributing. Prior to admission patient showed an acute decrease from her chronic hyponatremia. Patient presents like SIADH however, with cirrhosis ADH is appropriatly increased.  However, the acute decrease could be attributed to underlying thyroid disease or medication such as doxepin that can lead to SIADH.  Doxepin was held on admission and there was no change in sodium at that time. May need to be held for a longer period of time to see if it plays a role in her decrease in sodium prior to admission.  This is a new medication for the patient, started on about 1 week prior to admission.   Patient reports wanting her doxepin to help her sleep.  Would continue to fluid restrict. - Monitor BMET  - Daily weights, Strict I/Os - Palliative care consult  Alcoholic Liver Cirrhosis:  Patient denies abdominal pain today.  Had 2-3 bowel movements in the last 24 hrs. - Lactulose 20 g po to 4 times daily; titrate to 2-3 BMs per day - Rifaximin 550 mg BID - Holding home Lasix and Spironolactone  Subclinical Hypothyroidism On 11/28/2016 patients TSH was obtained and was elevated to 20.583 however free T4 was within normal limits.  This admission TSH was 8 and free t4 1.05.  Difficult to interpret thyroid dysfunction in patient with cirrhosis.  At this point there is no need for thyroid medication.    COPD  - Continue Spiriva, Duleraand albuterol  History of anxiety/depression  - Continuebuspar and doxepin   Dispo: Anticipated discharge pending palliative care meeting.  Valinda Party, DO 02/04/2017, 11:54 AM Pager: [phone]

## 2017-02-05 ENCOUNTER — Telehealth: Payer: Self-pay | Admitting: Internal Medicine

## 2017-02-05 DIAGNOSIS — I8510 Secondary esophageal varices without bleeding: Secondary | ICD-10-CM

## 2017-02-05 DIAGNOSIS — B1920 Unspecified viral hepatitis C without hepatic coma: Secondary | ICD-10-CM

## 2017-02-05 LAB — SODIUM, URINE, RANDOM

## 2017-02-05 LAB — OSMOLALITY, URINE: Osmolality, Ur: 308 mOsm/kg (ref 300–900)

## 2017-02-05 NOTE — Progress Notes (Signed)
Daughter Aldean Jewett [phone]. Lives with patient. Will be contact for hospice and home care.

## 2017-02-05 NOTE — Progress Notes (Signed)
   Subjective: Patient was evaluated this morning on rounds. She said she had slept all night and woke up feeling rested.  She denies any abdominal pain, dizziness or shortness of breath.  Objective:  Vital signs in last 24 hours: Vitals:   02/05/17 0414 02/05/17 0746 02/05/17 0751 02/05/17 0904  BP: 109/65  122/69   Pulse:    (!) 110  Resp: (!) 22 17  16   Temp: 98.6 F (37 C) 98.7 F (37.1 C)    TempSrc:  Oral    SpO2: 95% 99%  98%  Weight:      Height:       Physical Exam  Cardiovascular: Normal rate, regular rhythm and normal heart sounds.  Exam reveals no gallop and no friction rub.   No murmur heard. Pulmonary/Chest: Effort normal and breath sounds normal. No respiratory distress. She has no wheezes. She has no rales.  Abdominal: Soft. She exhibits no distension. There is no tenderness.  No fluid wave  Skin: Skin is warm and dry.    Assessment/Plan:  Principal Problem:   Hyponatremia Active Problems:   Chronic obstructive bronchitis (HCC)   Essential hypertension, benign   Cirrhosis (HCC)   Pressure injury of skin   Goals of care, counseling/discussion   Advance care planning   Palliative care by specialist  Hyponatremia: Patient is likely hyponatremic due to her cirrhosis with history of diarrhea and diuretic use contributing.  Serum sodium has remained stable at 118 throughout admission despite IV fluids. Repeat urine sodium and urine osmolality off diuretics is still pending. Would continue to fluid restrict. - Daily weights, Strict I/Os  Alcoholic Liver Cirrhosis: Patient denies abdominal pain or discomfort this morning. - Lactulose 20 g po to 4 times daily; titrate to 2-3 BMs per day - Rifaximin 550 mg BID - Holding home Lasix and Spironolactone - Palliative care consult  Subclinical Hypothyroidism On 11/28/2016 patients TSH was obtained and was elevated to 20.583 however free T4 was within normal limits.  This admission TSH was 8 and free t4 1.05.   Difficult to interpret thyroid dysfunction in patient with cirrhosis.  At this point there is no need for thyroid medication.    COPD  - Continue Spiriva, Duleraand albuterol  History of anxiety/depression  - Continuebuspar and doxepin   Sore throat Resolved.  Symptom relief with chloraseptic spray.  Dispo: Anticipated discharge pending palliative care meeting at [place], DO 02/05/2017, 11:24 AM Pager: [phone]

## 2017-02-05 NOTE — Progress Notes (Signed)
   Advanced Directive completed w/o notarization.  Due to lack of volunteers, chaplain will follow-up for notarization 02/06/17 morning.   If discharged prior to 02/06/17, patient instructed to call spiritual care for follow-up (contact information provided/on bedside table).   - Rev. West Bishop MDiv ThM

## 2017-02-05 NOTE — Telephone Encounter (Signed)
Needs TOC discharge date 02/05/17 HFU 02/12/17

## 2017-02-05 NOTE — Care Management Note (Addendum)
Case Management Note  Patient Details  Name: [name] [name] MRN: [id number] Date of Birth: [birth year]  Subjective/Objective:                 Spoke with patient in room, patient states she lives at home with her daughter and her three children. She states daughter does not work and provides 24 hour supervision for her. She states she has home oxygen and nebulizer through [id number], and she receives a [facility] RN through [facility] as well. Patient has RW.  Palliative meeting today.  15:20 Spoke with palliative, recommending home hospice. Patient and daughter [name] [name] provided choice, would like to use [facility]. Referral made to [facility]. Family states they would need hospital bed and bedside table. Spoke w FY Resident. Requested DME orders. Anticipate Dc in AM with delivery of hospital bed. Patient daughter to bring oxygen from home and provide transportation.    Action/Plan:  CM will continue to follow.      Expected Discharge Date:                  Expected Discharge Plan:  [place]  In-House Referral:     Discharge planning Services  CM Consult  Post Acute Care Choice:  Home Health Choice offered to:  Patient  DME Arranged:    DME Agency:     HH Arranged:  RN [place] Agency:  [place]  Status of Service:  In process, will continue to follow  If discussed at Long Length of Stay Meetings, dates discussed:    Additional Comments:  Carles Collet, RN 02/05/2017, 12:30 PM

## 2017-02-05 NOTE — Progress Notes (Signed)
Transitions of Care Pharmacy Note  Plan:  Educated on possible discontinuation of diuretics at discharge Recommended daily adherence to controller inhalers --------------------------------------------- [name] [name] is an 56 y.o. female who presents with a chief complaint hyponatremia. In anticipation of discharge, pharmacy has reviewed this patient's prior to admission medication history, as well as current inpatient medications listed per the [facility].  Current medication indications, dosing, frequency, and notable side effects reviewed with patient. patient verbalized understanding of current inpatient medication regimen and is aware that the After Visit Summary when presented, will represent the most accurate medication list at discharge.   Assessment: Understanding of regimen: fair Understanding of indications: fair Potential of compliance: fair Barriers to Obtaining Medications: No  Patient instructed to contact inpatient pharmacy team with further questions or concerns if needed.    Time spent preparing for discharge counseling: 15 Time spent counseling patient: 10   Thank you for allowing pharmacy to be a part of this patient's care.  Arrie Senate, PharmD PGY-1 Pharmacy Resident Pager: [phone] 02/05/2017

## 2017-02-05 NOTE — Discharge Summary (Signed)
Name: [name] [name] MRN: [id number] DOB: Mar 20, 1961 56 y.o. PCP: [name] Loll, MD  Date of Admission: 01/30/2017  2:06 PM Date of Discharge: 02/06/2017 Attending Physician: [name] Belt, MD  Discharge Diagnosis: 1. Hyponatremia 2. Alcoholic cirrhosis 3. Pharyngitis  Principal Problem:   Hyponatremia Active Problems:   Chronic obstructive bronchitis (HCC)   Essential hypertension, benign   Cirrhosis (HCC)   Pressure injury of skin   Goals of care, counseling/discussion   Advance care planning   Palliative care by specialist   Discharge Medications: Allergies as of 02/06/2017      Reactions   Ibuprofen Other (See Comments)   ulcers   Tylenol [acetaminophen] Other (See Comments)   2/2 liver damage   Amitriptyline Other (See Comments)   hallucinations      Medication List    TAKE these medications   albuterol 108 (90 Base) MCG/ACT inhaler Commonly known as:  PROVENTIL HFA;VENTOLIN HFA Inhale 2 puffs into the lungs every 6 (six) hours as needed for wheezing or shortness of breath.   albuterol (2.5 MG/3ML) 0.083% nebulizer solution Commonly known as:  PROVENTIL Take 3 mLs (2.5 mg total) by nebulization every 6 (six) hours as needed for wheezing or shortness of breath.   busPIRone 15 MG tablet Commonly known as:  BUSPAR 2  bid What changed:  how much to take  how to take this  when to take this  additional instructions   diclofenac sodium 1 % Gel Commonly known as:  VOLTAREN Apply 4 g topically 4 (four) times daily.   docusate sodium 100 MG capsule Commonly known as:  COLACE Take 1 capsule (100 mg total) by mouth daily as needed.   doxepin 25 MG capsule Commonly known as:  SINEQUAN Take 1 capsule (25 mg total) by mouth at bedtime.   furosemide 80 MG tablet Commonly known as:  LASIX Take 0.5 tablets (40 mg total) by mouth daily. What changed:  how much to take   hydrocortisone 2.5 % rectal cream Commonly known as:  ANUSOL-HC Apply  rectally 2 times daily   hydrOXYzine 25 MG tablet Commonly known as:  ATARAX/VISTARIL Take 1 tablet (25 mg total) by mouth 3 (three) times daily.   lactulose 10 GM/15ML solution Commonly known as:  CHRONULAC Take 30 mLs (20 g total) by mouth 3 (three) times daily.   LORazepam 1 MG tablet Commonly known as:  ATIVAN 1 qam  1  qpm What changed:  how much to take  how to take this  when to take this  additional instructions   Melatonin 1 MG Caps Take 1 mg by mouth at bedtime.   mometasone-formoterol 200-5 MCG/ACT Aero Commonly known as:  DULERA Inhale 2 puffs into the lungs 2 (two) times daily.   pantoprazole 40 MG tablet Commonly known as:  PROTONIX Take 1 tablet (40 mg total) by mouth 2 (two) times daily.   phenol 1.4 % Liqd Commonly known as:  CHLORASEPTIC Use as directed 1 spray in the mouth or throat as needed for throat irritation / pain.   Potassium Chloride ER 20 MEQ Tbcr Take 20 mEq by mouth 2 (two) times daily.   rifaximin 550 MG Tabs tablet Commonly known as:  XIFAXAN Take 1 tablet (550 mg total) by mouth 2 (two) times daily.   spironolactone 100 MG tablet Commonly known as:  ALDACTONE Take 2 tablets (200 mg total) by mouth daily.   tiotropium 18 MCG inhalation capsule Commonly known as:  SPIRIVA HANDIHALER Place 1 capsule (  18 mcg total) into inhaler and inhale daily.   traMADol 50 MG tablet Commonly known as:  ULTRAM Take 1 tablet (50 mg total) by mouth every 12 (twelve) hours as needed for severe pain.            Durable Medical Equipment        Start     Ordered   02/05/17 1538  For home use only DME Other see comment  Once    Comments:  Bed side table   02/05/17 1538   02/05/17 1535  For home use only DME Hospital bed  Once    Question:  Bed type  Answer:  Semi-electric   02/05/17 1538      Disposition and follow-up:   [name] [name] was discharged from [facility] in stable condition, to home hospice.  At  the hospital follow up visit please address:  1.  Overall progression of health issues.  Discuss other options for doxepin  2.  Labs / imaging needed at time of follow-up: none  3.  Pending labs/ test needing follow-up: none  Follow-up Appointments: Follow-up Information    [place]. Go on 02/12/2017.   Why:  You have an appointment on April 18th at 1:15pm Contact information: [address] [place] at [facility] Follow up.   Specialty:  Hospice and Palliative Medicine Why:  [facility] information: [place] [employer] [place] [id number] [place] Hospital Course by problem list: Principal Problem:   Hyponatremia Active Problems:   Chronic obstructive bronchitis (HCC)   Essential hypertension, benign   Cirrhosis ([place])   Pressure injury of skin   Goals of care, counseling/discussion   Advance care planning   Palliative care by specialist    Hyponatremia Patient is likely hyponatremic due to her cirrhosis. Patient arrived to the ED hypotensive and tachycardic.  She received 1 L normal saline bolus in the ED.  Repeat BMET showed a sodium of 119.  Patient's morning cortisol level was checked to assess for hypovolemic hyponatremia and was within normal limits. ACTH stimulation test showed rise in cortisol to 18 and 20 after 45min and 1min administration.  I do not suspect adrenal insufficiency.  Patient received fluids during admission without correction of her sodium.  Patient presents like SIADH however, with cirrhosis ADH is appropriatly increased.  However, the acute decrease could be attributed to underlying thyroid disease or medication such as doxepin that can lead to SIADH. Doxepin was held on admission and there was no change in sodium at that time. May need to be held for a longer period of time to see if it plays a role in her sodium.   This is a new medication for the patient, started on about 1 week prior to admission.  I suspect that her worsening sodium level is a progression of her cirrhosis.  Palliative was consulted after a family meeting and patient wanted to have home hospice.     Alcoholic liver cirrhosis Meld score (26) 19% mortality rate in 90 days.  Patient has a history of hepatic encephalopathy and takes lactulose and rifaximin at home.  Lactulose and rifaximin was held on first day of admission and resumed for the rest of the stay. She remained oriented to person, place and time throughout admission.   Lactic Acidosis Patient was hypotensive and tachycardic  on arrival to ED with a lactic acid of 4.3. After fluids lactic acid trended down.    Non anion gap metabolic acidosis Bicarb 16 and anion gap of 10.  Likely in the setting of diarrhea and spironalactone.   Cough Patient reported a 2 day history of cough and sore throat. On exam no erythema or inflamed lymph nodes were noted. No cervical lymph nodes were palpable.  Patient was afebrile with mild leukocytosis that resolved.  She was treated conservatively with Chloraseptic spray and Mucinex.  Hx of Hepatitis C 03/05/2015 HCV quantitative was not detected  Subclinical Hypothyroidism On 11/28/2016 patients TSH was obtained and was elevated to 20.583, free T4 was within normal limits.  This admission TSH was 8 and free t4 1.05.  Difficult to interpret thyroid dysfunction in patient with cirrhosis and acute illness. At this point there is no need for thyroid medication.    COPD  -continue Spiriva, Duleraand albuterol  History of anxiety/depression  -continue buspar, holding doxepin   Discharge Vitals:   BP 105/77 (BP Location: Right Arm)   Pulse (!) 116   Temp 98.1 F (36.7 C) (Oral)   Resp 20   Ht 5\' 4"  (1.626 m)   Wt 172 lb 13.5 oz (78.4 kg)   SpO2 94%   BMI 29.67 kg/m    Discharge Instructions: Discharge Instructions    Diet - low  sodium heart healthy    Complete by:  As directed    Discharge instructions    Complete by:  As directed    Please take lasix 40mg  daily and continue the rest of your medications as prescribed.  Would discuss with your doctor about other options for doxepin as this could be worsening your sodium levels in your blood. Please follow up with your primary care doctor after discharge.   Increase activity slowly    Complete by:  As directed       Signed: Valinda Party, DO 02/06/2017, 11:52 AM   Pager: [phone]

## 2017-02-05 NOTE — Progress Notes (Signed)
[facility]  Patient Status: Active (receiving services up to time of hospitalization)  [facility] is providing the following services: RN and MSW  If patient discharges after hours, please call [phone].   [name] [name] 02/05/2017, 12:22 PM

## 2017-02-05 NOTE — Progress Notes (Signed)
Confirmed patient active with [facility] for RN, MSW prior to admission.

## 2017-02-05 NOTE — Progress Notes (Addendum)
[facility] ([facility])  [facility] RN Any Evans received request from [employer] for family interest in [facility] services in the home after discharge. Chart and patient information currently under review to confirm hospice eligibility.   Spoke with daughter [name] [name] by phone and met with patient at bedside to explain services and confirm interest. Both verbalized understanding of information provided. Per daughter plan is for patient to return home 02/05/17. She is not sure how patient should travel and reports she does not have a portable tank to bring to the hospital. DME discussed and ordered during this visit includes hospital bed, OBT and RW. DME ordered by [facility] equipment manager Gayland Curry. Home address has been verified. Daughter [name] [name] is contact for delivery. Patient already has oxygen and BSC at home. Per [name] [name], [facility] plans to deliver DME to home this evening between 7:00 and 11:00. Will confirm delivery and transport in am. Will follow up with RNCM in am.   Please send signed and completed out of facility DNR home with patient.  Patient will need scripts for any medication she does not already have. Please fax discharge summary to [phone]. Please notify [facility] when patient is ready to leave unit.   Melissa has [facility] contact information. Please call with questions.   02/06/17 - 8:30 DME has been delivered to home. Daughter [name] [name] waiting to learn how patient should transfer home.  02/06/17 - 10:38 received call back from [facility] confirming patient to transport by car. [facility] contacted to deliver portable tank to room. RNCM aware [facility] AV RN is scheduled to see patient at 3:00 pm today.    Thank you,  Erling Conte, LCSW [phone]
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
## 2017-02-06 ENCOUNTER — Ambulatory Visit: Payer: Self-pay | Admitting: Podiatry

## 2017-02-06 DIAGNOSIS — I10 Essential (primary) hypertension: Secondary | ICD-10-CM

## 2017-02-06 DIAGNOSIS — L8990 Pressure ulcer of unspecified site, unspecified stage: Secondary | ICD-10-CM

## 2017-02-06 LAB — CREATININE, SERUM
Creatinine, Ser: 0.65 mg/dL (ref 0.44–1.00)
GFR calc Af Amer: >60 mL/min (ref >60)
GFR calc non Af Amer: >60 mL/min (ref >60)

## 2017-02-06 MED ORDER — FUROSEMIDE 80 MG PO TABS: 40.0000 mg | ORAL_TABLET | Freq: Every day | ORAL | 2 refills | Status: AC
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
## 2017-02-06 NOTE — Progress Notes (Signed)
   Subjective: Patient was evaluated this morning on rounds. She denies any abdominal discomfort or pain, shortness of breath, sore throat or cough.  Objective:  Vital signs in last 24 hours: Vitals:   02/06/17 0830 02/06/17 0900 02/06/17 1000 02/06/17 1100  BP:      Pulse:  89 98 (!) 116  Resp:  18 17 20   Temp:      TempSrc:      SpO2: 95% 98% 94% 94%  Weight:      Height:       Physical Exam  Cardiovascular: Normal rate and regular rhythm.   Murmur heard. Pulmonary/Chest: Effort normal.  Bibasilar crackles  Abdominal: Soft. Bowel sounds are normal. She exhibits no distension. There is no tenderness.  Musculoskeletal:  3+ pitting edema bilaterally  Neurological: She is alert.  Skin: Skin is warm and dry.  Psychiatric: Mood and affect normal.     Assessment/Plan:  Principal Problem:   Hyponatremia Active Problems:   Chronic obstructive bronchitis (HCC)   Essential hypertension, benign   Cirrhosis (HCC)   Pressure injury of skin   Goals of care, counseling/discussion   Advance care planning   Palliative care by specialist  Hyponatremia: Serum sodium has remained stable at 118 throughout admission. Repeat urine studies shows urine sodium to be less than 10.  At this point would have to give normal saline and reassess as it is not conclusive for SIADH.  Patient's morning cortisol level was checked to assess for hypovolemic hyponatremia and was within normal limits.ACTH stimulation test showed rise in cortisol to 18 and 20 after 60min and 51min administration.  I do not suspect adrenal insufficiency. I do believe that the patient's cirrhosis is a significant factor in her hyponatremia and can not be accurately worked up for other causes due to pathophysiology of the disease.  Patient had a meeting with palliative care and decided to go home with hospice. - discharge with home hospice  Alcoholic Liver Cirrhosis: Patient denies abdominal pain or discomfort this  morning. - Lactulose 20 g po to 4 times daily; titrate to 2-3 BMs per day - Rifaximin 550 mg BID - Holding home Lasix and Spironolactone  Subclinical Hypothyroidism On 11/28/2016 patients TSH was obtained and was elevated to 20.583 however free T4 was within normal limits. This admission TSH was 8 and free t4 1.05. Difficult to interpret thyroid dysfunction in patient with cirrhosis. At this point there is no need for thyroid medication.   COPD  - Continue Spiriva, Duleraand albuterol  History of anxiety/depression  - Continuebuspar and doxepin   Sore throat Resolved.  Symptom relief with chloraseptic spray.  Dispo: Anticipated discharge today to home hospice   [facility], DO 02/06/2017, 11:53 AM Pager: [phone]

## 2017-02-06 NOTE — Progress Notes (Signed)
Responded to [facility] Consult to continue support to patient and assist patient with completing AD.  Spoke with [name]) from pallative care who said her daughter could not be here this morning and was trying to get a ride to hospital.  Documents were completed and copied were made and given to patient.  A copy was given to patient 's nurse for pt.'s  Chart.  Patient appears to be in good spirits.  Pt. Is being discharged today to Hospice. Jaclynn Major, [place]

## 2017-02-06 NOTE — Plan of Care (Signed)
Problem: Health Behavior/Discharge Planning: Goal: Ability to manage health-related needs will improve Outcome: Progressing Patient supposed to be going home with hospice.   Problem: Activity: Goal: Risk for activity intolerance will decrease Outcome: Not Progressing Patient increasingly weaker. Difficult for patient to get up to commode. Movement is exhausting to pt. Close monitoring to prevent injury.

## 2017-02-07 ENCOUNTER — Telehealth: Payer: Self-pay

## 2017-02-07 NOTE — Telephone Encounter (Signed)
Hospice nurse Vara Guardian requesting V?O for DNR per patient's request please advise

## 2017-02-07 NOTE — Telephone Encounter (Signed)
Agree. Thanks

## 2017-02-11 NOTE — Telephone Encounter (Signed)
V/O DNR left on Hospice nurse voicemail on 02/10/2017

## 2017-02-12 ENCOUNTER — Ambulatory Visit: Payer: Self-pay

## 2017-02-15 ENCOUNTER — Encounter (HOSPITAL_COMMUNITY): Payer: Self-pay | Admitting: Emergency Medicine

## 2017-02-15 ENCOUNTER — Emergency Department (HOSPITAL_COMMUNITY)
Admission: EM | Admit: 2017-02-15 | Discharge: 2017-02-15 | Disposition: A | Discharge status: Home / Self Care | Payer: Medicaid Other | Attending: Emergency Medicine | Admitting: Emergency Medicine

## 2017-02-15 ENCOUNTER — Emergency Department (HOSPITAL_COMMUNITY): Payer: Medicaid Other

## 2017-02-15 DIAGNOSIS — J449 Chronic obstructive pulmonary disease, unspecified: Secondary | ICD-10-CM | POA: Diagnosis not present

## 2017-02-15 DIAGNOSIS — Z888 Allergy status to other drugs, medicaments and biological substances status: Secondary | ICD-10-CM

## 2017-02-15 DIAGNOSIS — G9349 Other encephalopathy: Secondary | ICD-10-CM

## 2017-02-15 DIAGNOSIS — D638 Anemia in other chronic diseases classified elsewhere: Secondary | ICD-10-CM | POA: Diagnosis not present

## 2017-02-15 DIAGNOSIS — R4182 Altered mental status, unspecified: Secondary | ICD-10-CM | POA: Diagnosis present

## 2017-02-15 DIAGNOSIS — Z8249 Family history of ischemic heart disease and other diseases of the circulatory system: Secondary | ICD-10-CM

## 2017-02-15 DIAGNOSIS — F1021 Alcohol dependence, in remission: Secondary | ICD-10-CM | POA: Diagnosis not present

## 2017-02-15 DIAGNOSIS — Z803 Family history of malignant neoplasm of breast: Secondary | ICD-10-CM

## 2017-02-15 DIAGNOSIS — R791 Abnormal coagulation profile: Secondary | ICD-10-CM | POA: Insufficient documentation

## 2017-02-15 DIAGNOSIS — Z7951 Long term (current) use of inhaled steroids: Secondary | ICD-10-CM | POA: Diagnosis not present

## 2017-02-15 DIAGNOSIS — Z800 Family history of malignant neoplasm of digestive organs: Secondary | ICD-10-CM

## 2017-02-15 DIAGNOSIS — I10 Essential (primary) hypertension: Secondary | ICD-10-CM | POA: Insufficient documentation

## 2017-02-15 DIAGNOSIS — G9340 Encephalopathy, unspecified: Secondary | ICD-10-CM | POA: Diagnosis not present

## 2017-02-15 DIAGNOSIS — Z8619 Personal history of other infectious and parasitic diseases: Secondary | ICD-10-CM | POA: Diagnosis not present

## 2017-02-15 DIAGNOSIS — K7030 Alcoholic cirrhosis of liver without ascites: Secondary | ICD-10-CM | POA: Diagnosis not present

## 2017-02-15 DIAGNOSIS — Z8049 Family history of malignant neoplasm of other genital organs: Secondary | ICD-10-CM

## 2017-02-15 DIAGNOSIS — F17210 Nicotine dependence, cigarettes, uncomplicated: Secondary | ICD-10-CM | POA: Diagnosis not present

## 2017-02-15 DIAGNOSIS — Z79899 Other long term (current) drug therapy: Secondary | ICD-10-CM | POA: Diagnosis not present

## 2017-02-15 DIAGNOSIS — Z8541 Personal history of malignant neoplasm of cervix uteri: Secondary | ICD-10-CM | POA: Insufficient documentation

## 2017-02-15 DIAGNOSIS — F418 Other specified anxiety disorders: Secondary | ICD-10-CM | POA: Diagnosis not present

## 2017-02-15 DIAGNOSIS — R627 Adult failure to thrive: Secondary | ICD-10-CM | POA: Diagnosis not present

## 2017-02-15 DIAGNOSIS — Z886 Allergy status to analgesic agent status: Secondary | ICD-10-CM

## 2017-02-15 LAB — CBC WITH DIFFERENTIAL/PLATELET
Basophils Absolute: 0.1 10*3/uL (ref 0.0–0.1)
Basophils Relative: 1 %
Eosinophils Absolute: 0.1 10*3/uL (ref 0.0–0.7)
Eosinophils Relative: 1 %
HCT: 27.7 % — ABNORMAL LOW (ref 36.0–46.0)
Hemoglobin: 8.8 g/dL — ABNORMAL LOW (ref 12.0–15.0)
Lymphocytes Relative: 16 %
Lymphs Abs: 1.2 10*3/uL (ref 0.7–4.0)
MCH: 29.2 pg (ref 26.0–34.0)
MCHC: 31.8 g/dL (ref 30.0–36.0)
MCV: 92 fL (ref 78.0–100.0)
Monocytes Absolute: 1.3 10*3/uL — ABNORMAL HIGH (ref 0.1–1.0)
Monocytes Relative: 18 %
Neutro Abs: 4.9 10*3/uL (ref 1.7–7.7)
Neutrophils Relative %: 64 %
Platelets: 163 10*3/uL (ref 150–400)
RBC: 3.01 MIL/uL — ABNORMAL LOW (ref 3.87–5.11)
RDW: 17.2 % — ABNORMAL HIGH (ref 11.5–15.5)
WBC: 7.6 10*3/uL (ref 4.0–10.5)

## 2017-02-15 LAB — ETHANOL: Alcohol, Ethyl (B): <5 mg/dL (ref <5)

## 2017-02-15 LAB — I-STAT ARTERIAL BLOOD GAS, ED
Acid-base deficit: 2 mmol/L (ref 0.0–2.0)
Bicarbonate: 20.9 mmol/L (ref 20.0–28.0)
O2 SAT: 95 %
PCO2 ART: 27.5 mmHg — AB (ref 32.0–48.0)
PH ART: 7.487 — AB (ref 7.350–7.450)
TCO2: 22 mmol/L (ref 0–100)
pO2, Arterial: 66 mmHg — ABNORMAL LOW (ref 83.0–108.0)

## 2017-02-15 LAB — BASIC METABOLIC PANEL
Anion gap: 10 (ref 5–15)
BUN: 17 mg/dL (ref 6–20)
CO2: 21 mmol/L — ABNORMAL LOW (ref 22–32)
Calcium: 8.9 mg/dL (ref 8.9–10.3)
Chloride: 102 mmol/L (ref 101–111)
Creatinine, Ser: 1.06 mg/dL — ABNORMAL HIGH (ref 0.44–1.00)
GFR calc Af Amer: >60 mL/min (ref >60)
GFR calc non Af Amer: 57 mL/min — ABNORMAL LOW (ref >60)
Glucose, Bld: 134 mg/dL — ABNORMAL HIGH (ref 65–99)
Potassium: 4 mmol/L (ref 3.5–5.1)
Sodium: 133 mmol/L — ABNORMAL LOW (ref 135–145)

## 2017-02-15 LAB — URINALYSIS, ROUTINE W REFLEX MICROSCOPIC
Bilirubin Urine: NEGATIVE
Glucose, UA: NEGATIVE mg/dL
Hgb urine dipstick: NEGATIVE
Ketones, ur: NEGATIVE mg/dL
Leukocytes, UA: NEGATIVE
Nitrite: NEGATIVE
Protein, ur: NEGATIVE mg/dL
Specific Gravity, Urine: 1.015 (ref 1.005–1.030)
pH: 5 (ref 5.0–8.0)

## 2017-02-15 LAB — I-STAT CG4 LACTIC ACID, ED: LACTIC ACID, VENOUS: 2.7 mmol/L — AB (ref 0.5–1.9)

## 2017-02-15 LAB — HEPATIC FUNCTION PANEL
ALBUMIN: 2.6 g/dL — AB (ref 3.5–5.0)
ALK PHOS: 127 U/L — AB (ref 38–126)
ALT: 38 U/L (ref 14–54)
AST: 95 U/L — AB (ref 15–41)
BILIRUBIN TOTAL: 4.1 mg/dL — AB (ref 0.3–1.2)
Bilirubin, Direct: 1.2 mg/dL — ABNORMAL HIGH (ref 0.1–0.5)
Indirect Bilirubin: 2.9 mg/dL — ABNORMAL HIGH (ref 0.3–0.9)
TOTAL PROTEIN: 6.1 g/dL — AB (ref 6.5–8.1)

## 2017-02-15 LAB — PROTIME-INR
INR: 1.39
PROTHROMBIN TIME: 17.2 s — AB (ref 11.4–15.2)

## 2017-02-15 LAB — I-STAT TROPONIN, ED: TROPONIN I, POC: 0.01 ng/mL (ref 0.00–0.08)

## 2017-02-15 LAB — AMMONIA: Ammonia: 47 umol/L — ABNORMAL HIGH (ref 9–35)

## 2017-02-15 LAB — CK: Total CK: 394 U/L — ABNORMAL HIGH (ref 38–234)
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
## 2017-02-15 NOTE — ED Triage Notes (Signed)
Pt arrives via [facility] from home, ems reports pt is going under hospice tmrw and that patient slid out of bed tonight, also reported pt is altered. Ems reports that pt is oriented to person and place at this time. On 2L home O2. VSS pta.

## 2017-02-15 NOTE — ED Notes (Signed)
Spoke with patient's daughter who is the patient's POA. She is has been in touch with hospice who have arranged a place for her to stay at [facility]. She is requesting that her mother be transported to this facility instead of admission as this patient is currently a hospice patient and no invasive tests, procedures, or aggressive measures are to be taken. Internal medicine made aware of patient's daughter's request. They will make attending aware and likely transfer her out to [facility] place if the attending is agreeable.

## 2017-02-15 NOTE — ED Notes (Signed)
Dr Betsey Holiday given a copy of lactic acid results 2.70

## 2017-02-15 NOTE — ED Notes (Signed)
Patient transported to CT 

## 2017-02-15 NOTE — ED Notes (Signed)
Patient took O2 off nose- could not recall why. Patient Sp)2 85%/RA. Patient placed back on 2L nasal cannula and instructed I deep breathing. SpO2 99%/2L of O2

## 2017-02-15 NOTE — Discharge Instructions (Signed)
Transfer to [facility] for further care
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
## 2017-02-15 NOTE — ED Provider Notes (Signed)
Consultation with Education officer, museum. Patient will be transferred to [facility].   Nat Christen, MD 02/15/17 1154

## 2017-02-25 DEATH — deceased

## 2017-03-05 ENCOUNTER — Ambulatory Visit (HOSPITAL_COMMUNITY): Payer: Self-pay | Admitting: Psychiatry

## 2017-03-31 NOTE — Addendum Note (Signed)
Addendum  created 03/31/17 1225 by Myrtie Soman, MD   Sign clinical note

## 2017-05-07 IMAGING — US US ABDOMEN COMPLETE
1 series · 14 of 25 positions shown · non-contrast
Comparison: Abdominal ultrasound 03/06/2015

CLINICAL DATA: Patient with history of alcoholic cirrhosis.
Hepatitis-C.

EXAM:
ABDOMEN ULTRASOUND COMPLETE

[Series 1: us abdomen complete · 0.32mm/px · 14 of 83 slices shown]
[im 1/83]
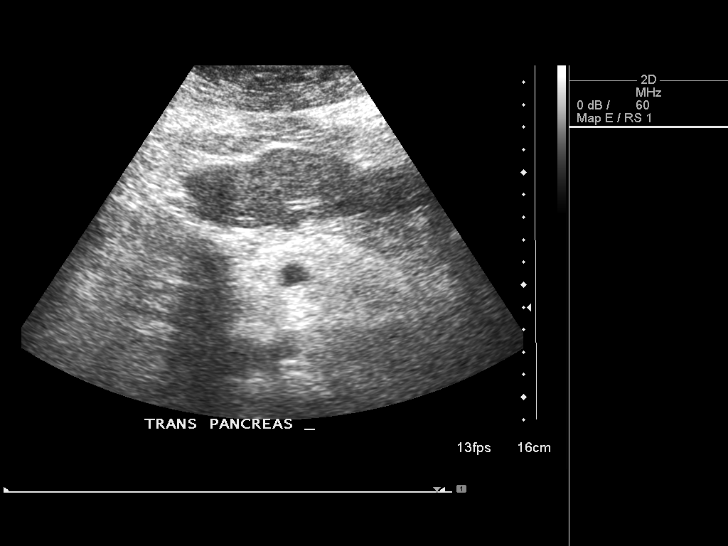
[im 7/83]
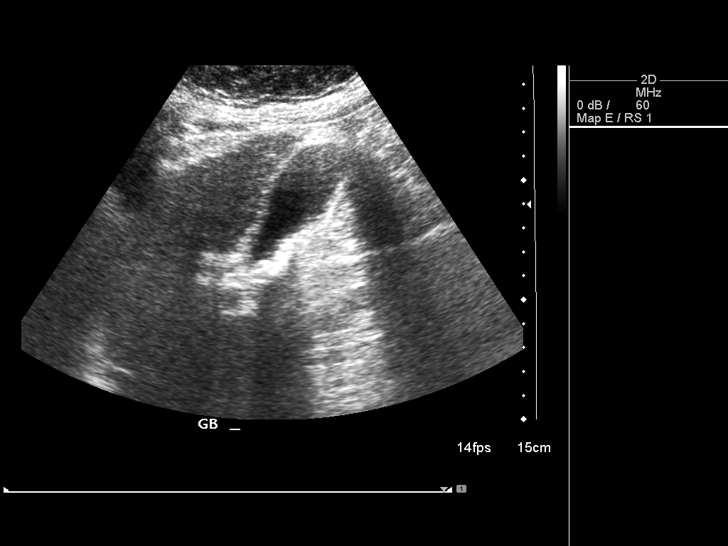
[im 14/83]
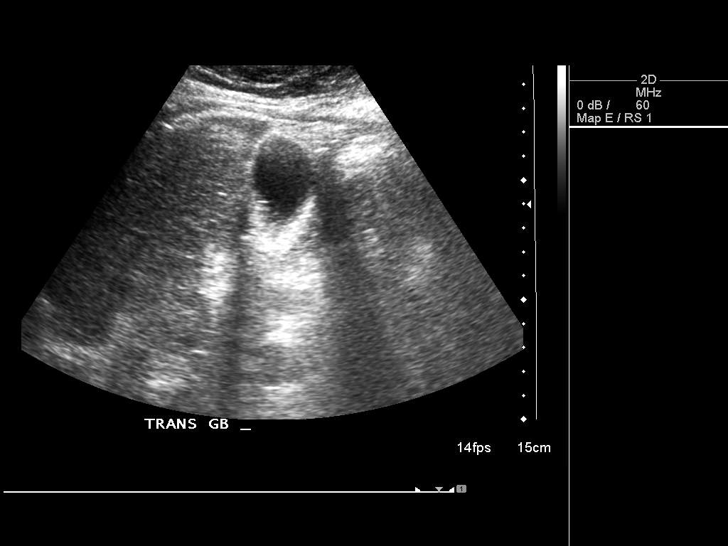
[im 21/83]
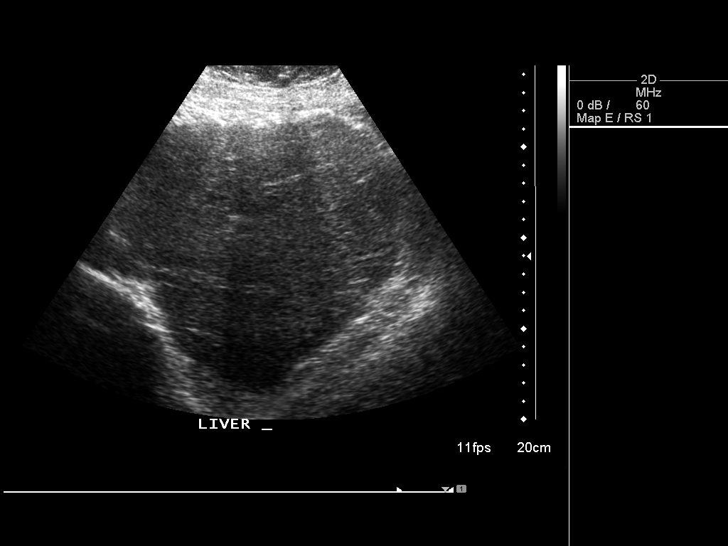
[im 28/83]
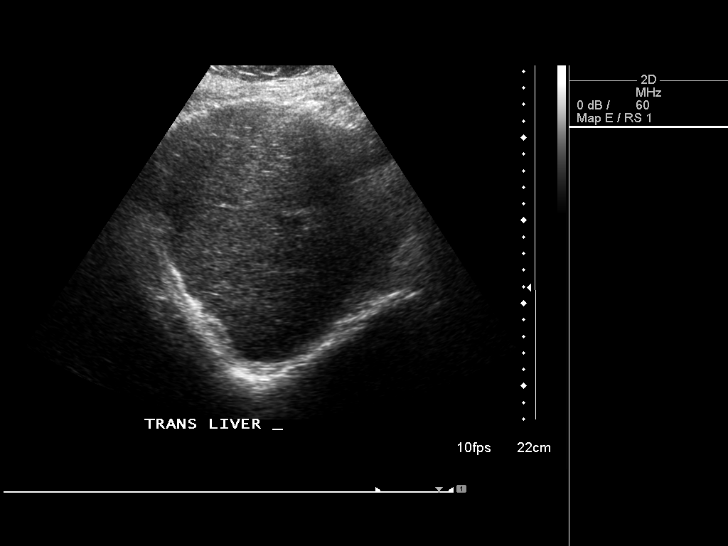
[im 31/83]
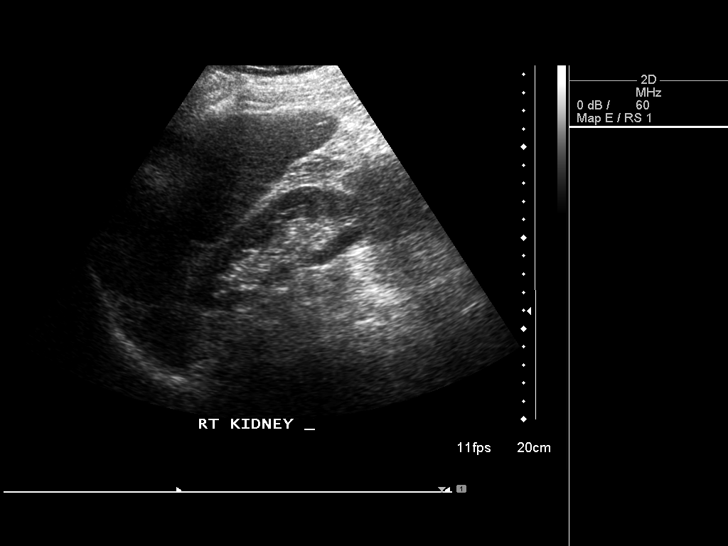
[im 38/83]
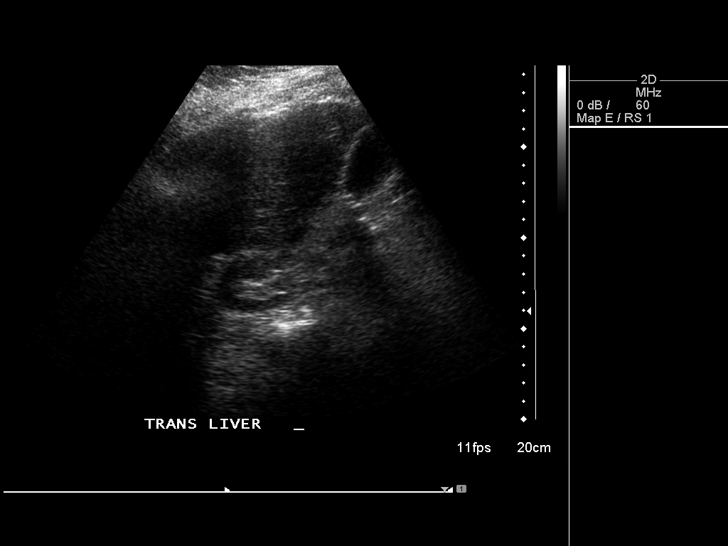
[im 45/83]
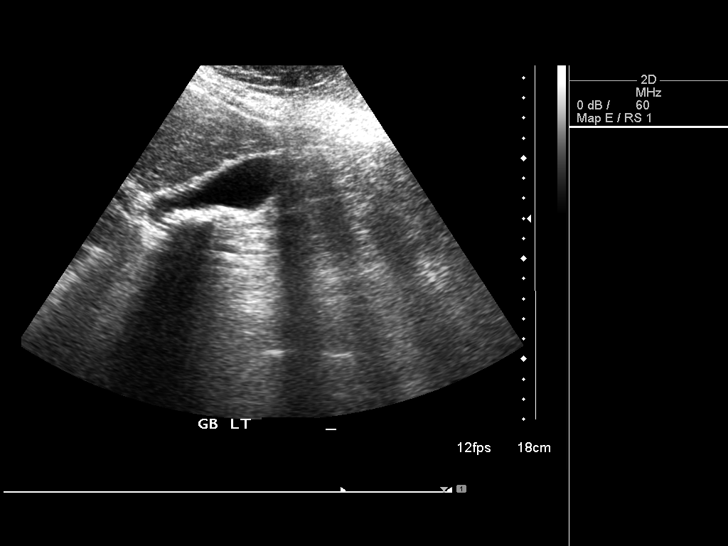
[im 52/83]
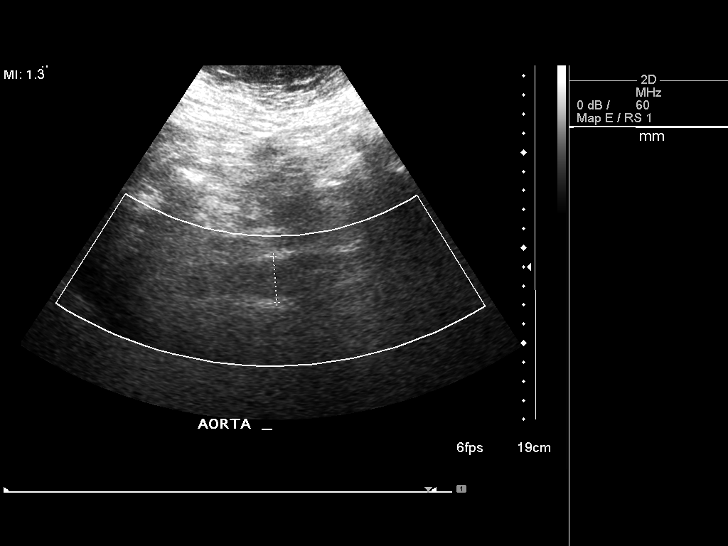
[im 55/83]
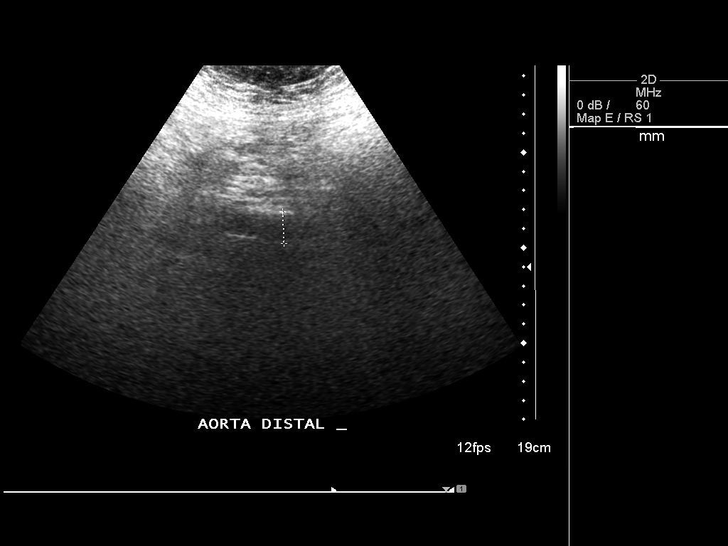
[im 62/83]
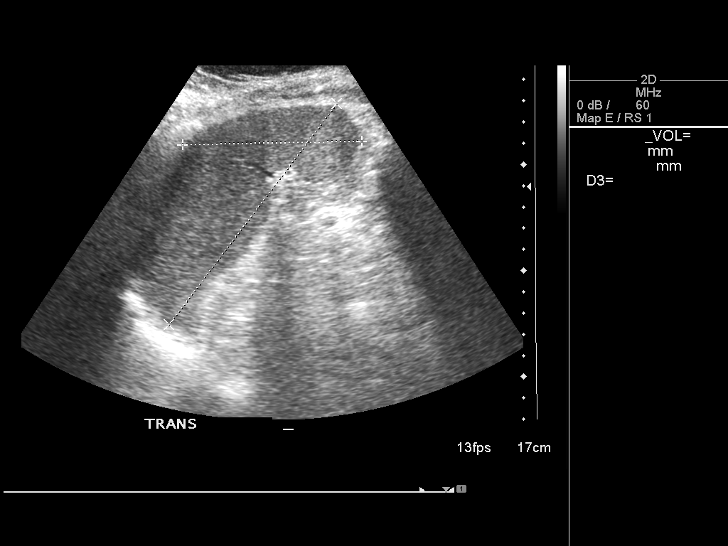
[im 69/83]
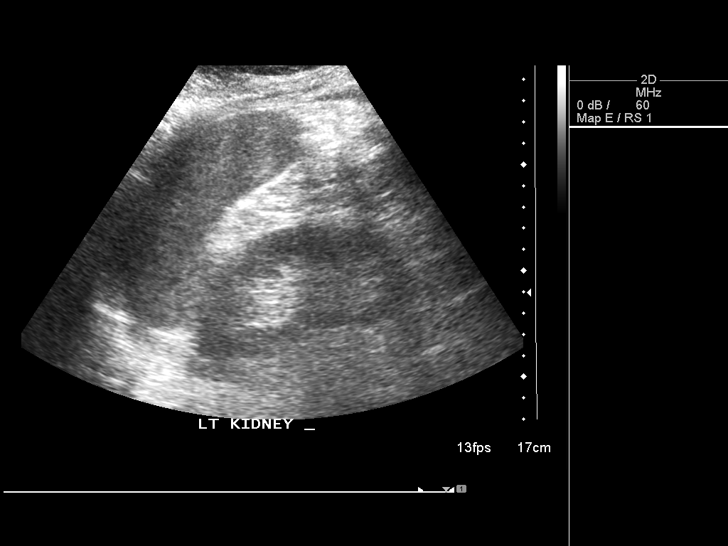
[im 76/83]
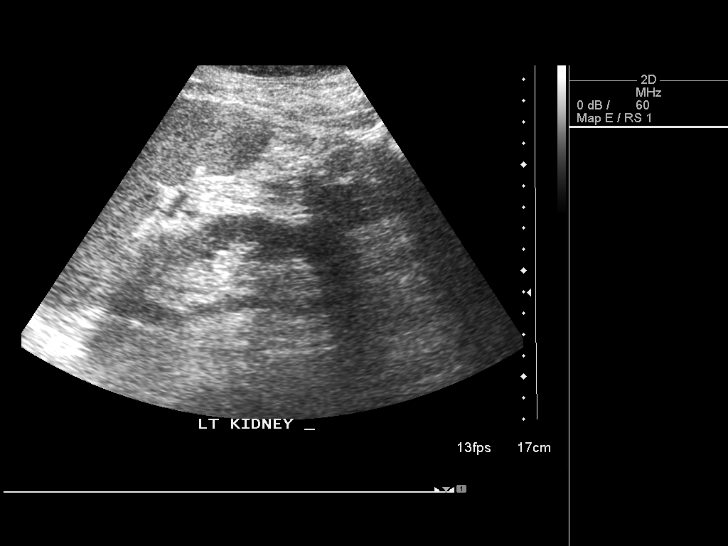
[im 83/83]
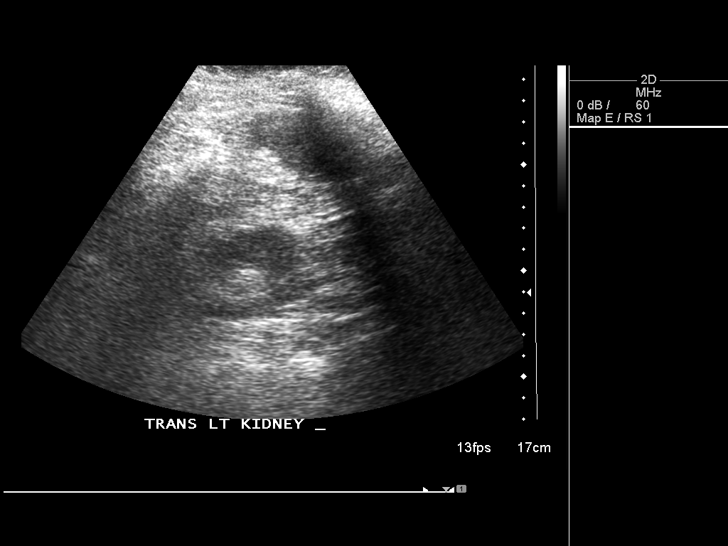

[14 of 25 positions shown; findings below may reference images not displayed]

FINDINGS: Gallbladder: Multiple stones are demonstrated within the gallbladder
lumen. Gallbladder wall mildly thickened. No pericholecystic fluid.

Common bile duct: Diameter: 3 mm

Liver: The liver is nodular in contour and coarse in echogenicity
compatible with cirrhosis. No focal lesion identified.

IVC: No abnormality visualized.

Pancreas: Visualized portion unremarkable.

Spleen: Enlarged measuring up to 13 cm.

Right Kidney: Length: 12.4 cm. Echogenicity within normal limits. No
mass or hydronephrosis visualized.

Left Kidney: Length: 11.2 cm. Echogenicity within normal limits. No
mass or hydronephrosis visualized.

Abdominal aorta: No aneurysm visualized.

Other findings: None.
IMPRESSION: Cholelithiasis without sonographic evidence for acute cholecystitis.

Morphologic changes to the liver compatible with cirrhosis.

Splenomegaly.

## 2017-05-16 NOTE — Telephone Encounter (Signed)
Note opened in error.

## 2018-02-04 IMAGING — DX DG CHEST 2V
3 series · 3 of 3 positions shown · non-contrast
Comparison: 08/28/2016

CLINICAL DATA: Dyspnea and diffuse myalgias.

EXAM:
CHEST  2 VIEW

[w chest lat (1 of 2)]
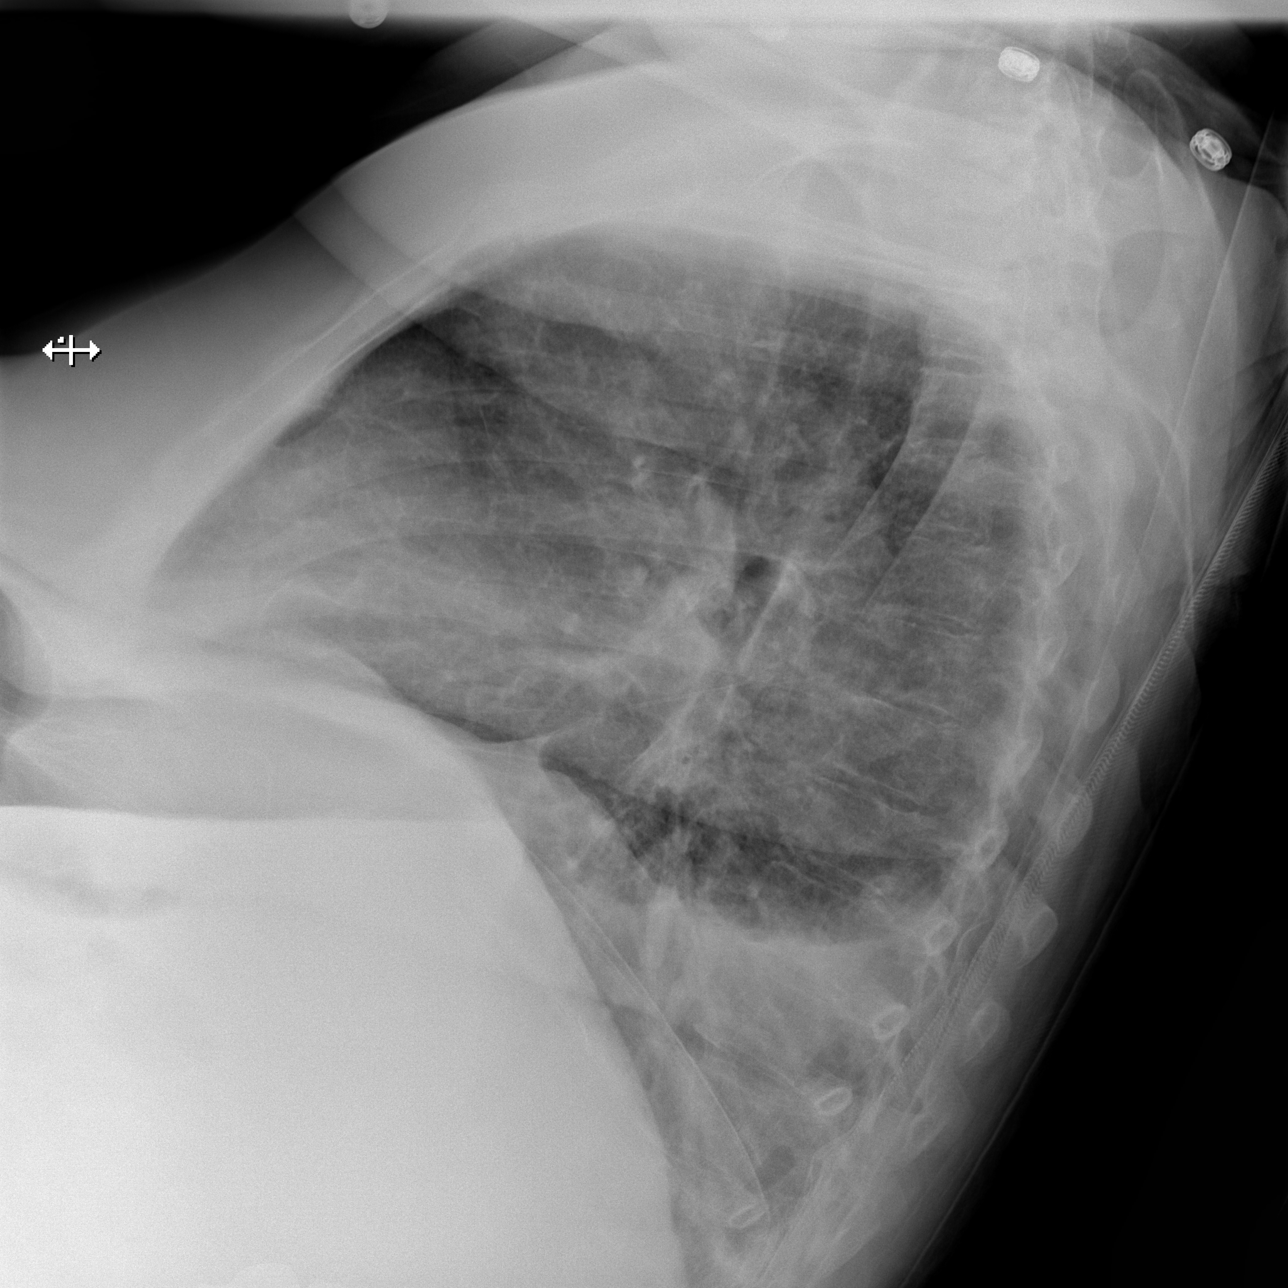

[w chest lat (2 of 2)]
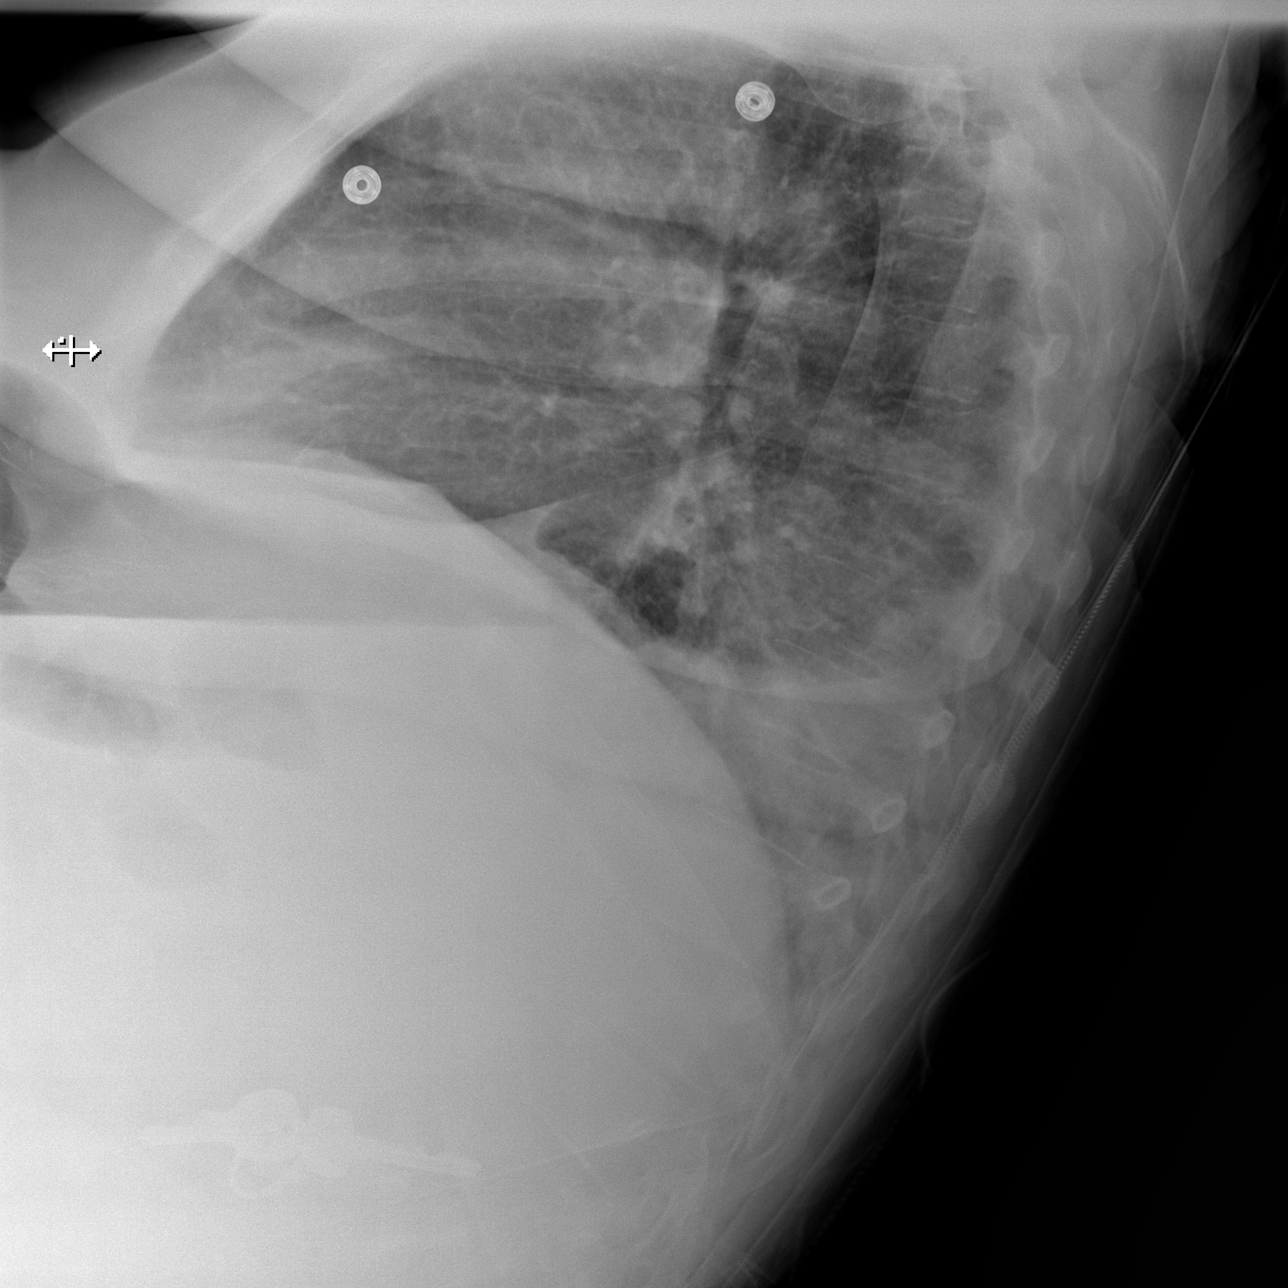

[x chest ap]
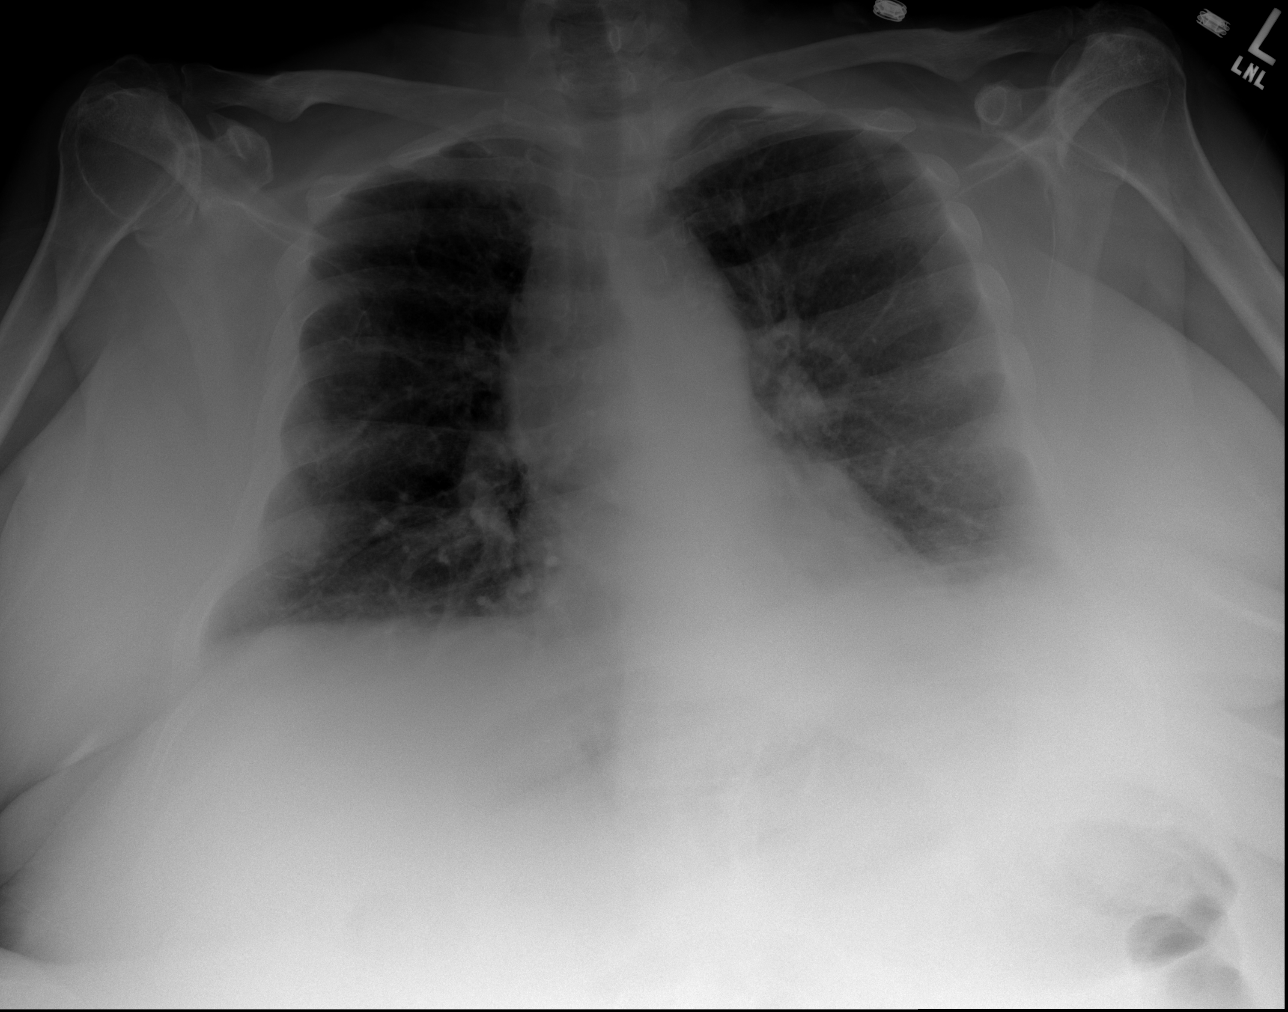

[3 of 3 positions shown; findings below may reference images not displayed]

FINDINGS: Small left pleural effusion. The lungs are clear except for minor
left base linear opacities, probably atelectatic. Pulmonary
vasculature is normal. Heart size is normal.
IMPRESSION: Left pleural effusion and adjacent atelectasis.

## 2018-03-31 IMAGING — CT CT CERVICAL SPINE W/O CM
4 of 8 series · 13 of 33 positions shown, 14 images · non-contrast
Comparison: Head CT without contrast 08/29/2016. Cervical spine
radiographs 07/13/2011.

CLINICAL DATA: 55-year-old female status post multiple falls in the
last few weeks with head injury. Posterior neck and right side head
pain. Initial encounter.

EXAM:
CT HEAD WITHOUT CONTRAST
CT CERVICAL SPINE WITHOUT CONTRAST
TECHNIQUE: Multidetector CT imaging of the head and cervical spine was
performed following the standard protocol without intravenous
contrast. Multiplanar CT image reconstructions of the cervical spine
were also generated.

[Series 203: coronal st, idose (1) · coronal · 0.40mm/px · 2 of 67 slices shown]
[im 23/67  bone]
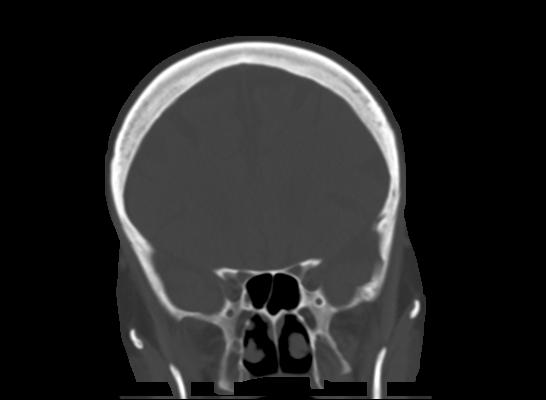
[im 45/67  bone]
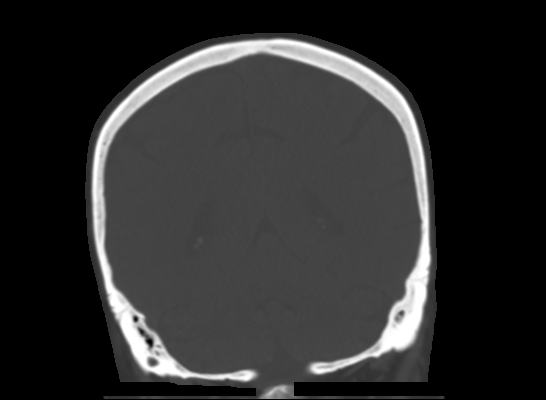

[Series 302: soft tissue, idose (2) · axial · 0.39mm/px · z∈[+125,+221]mm · 3 of 97 slices shown]
[im 25/97  soft-tissue]
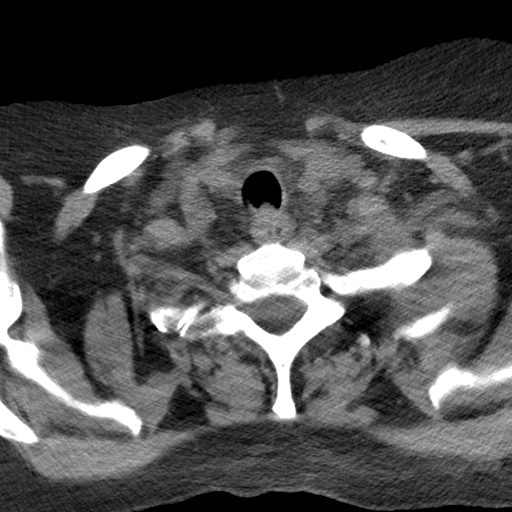
[im 49/97  soft-tissue]
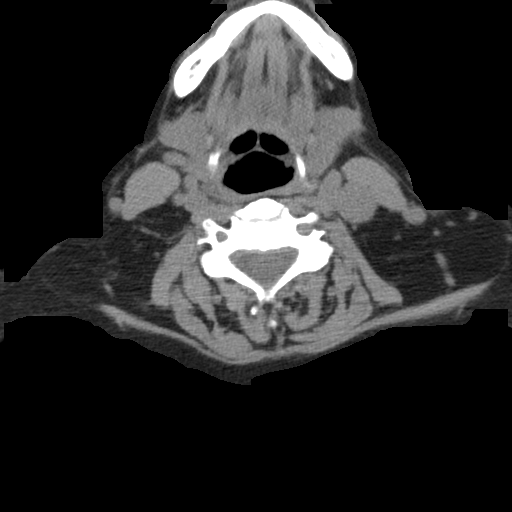
[im 73/97  soft-tissue]
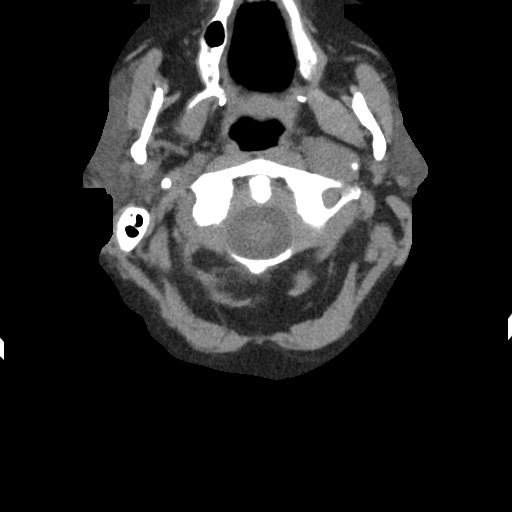

[Series 305: sagittal, idose (2) · sagittal · 0.33mm/px · 5 of 100 slices shown]
[im 15/100  bone]
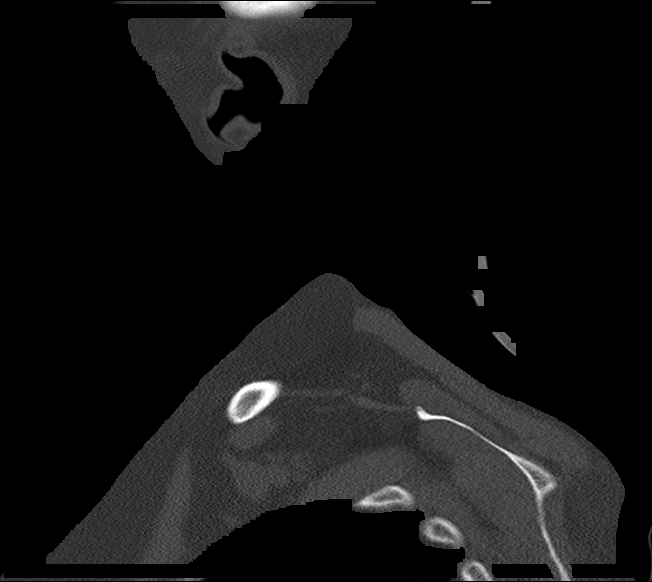
[im 29/100  bone]
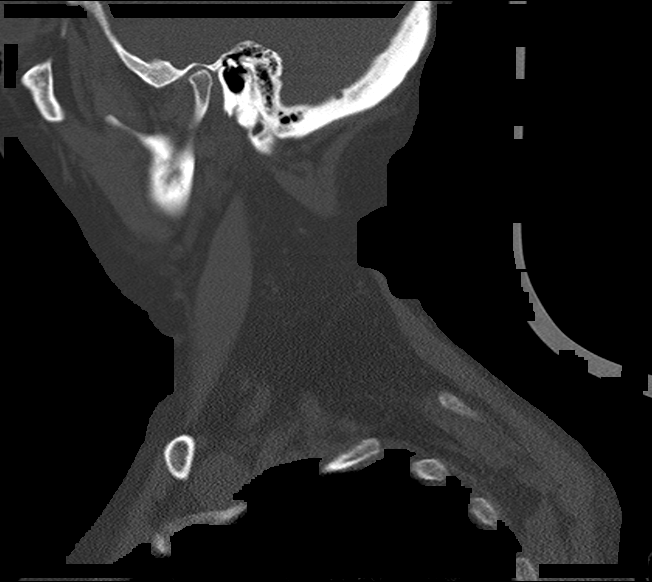
[im 43/100  bone]
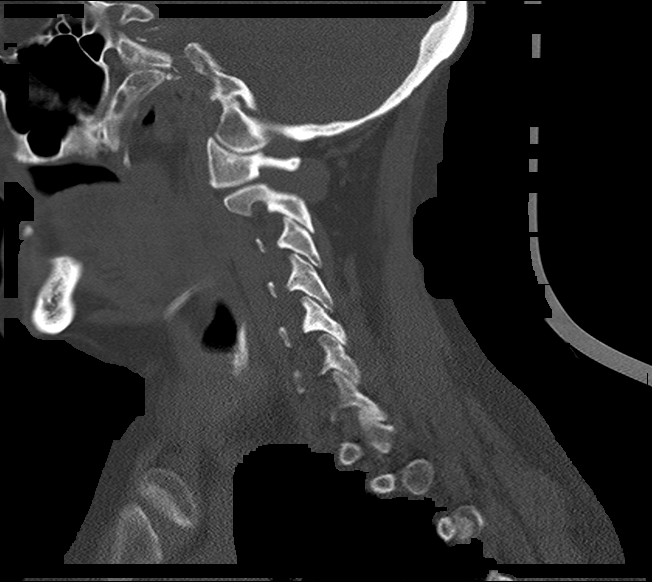
[im 57/100  bone]
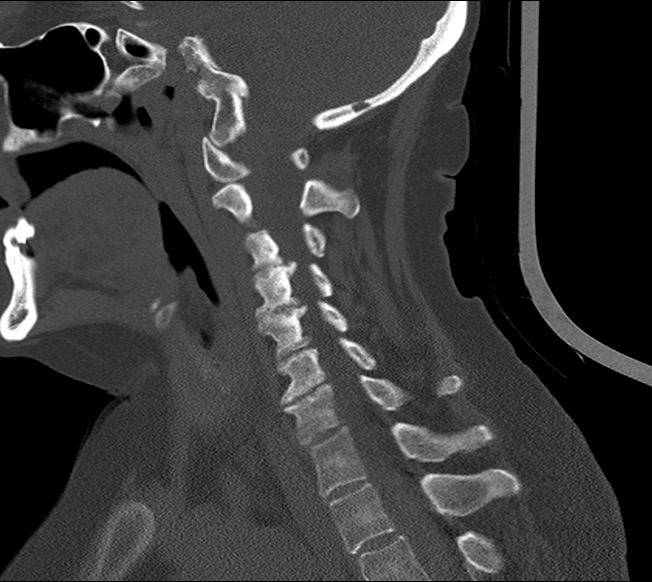
[im 71/100  bone]
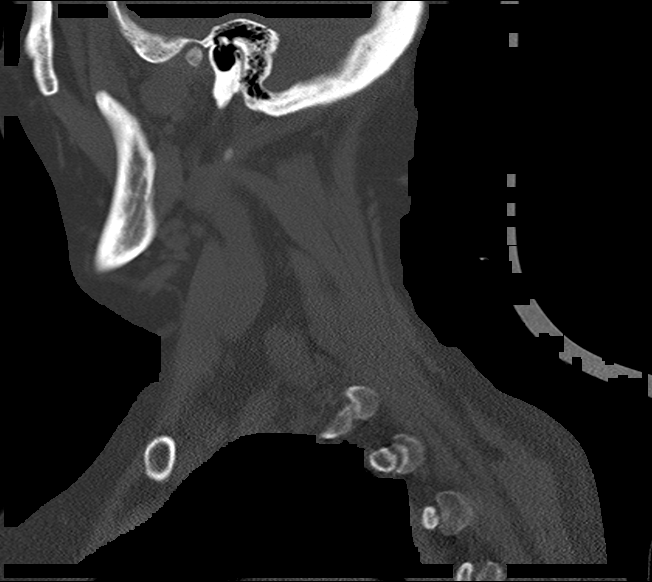

[Series 306: orthogonals, idose (2) · axial · 0.52mm/px · z∈[+86,+171]mm · 3 of 97 slices shown, 4 images]
[im 25/97  soft-tissue]
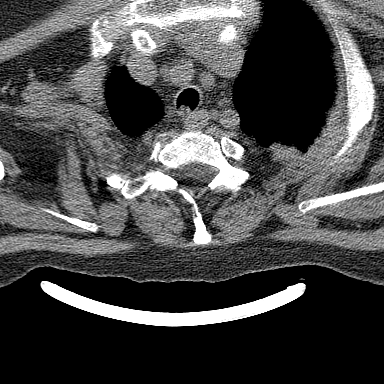
[im 25/97  bone]
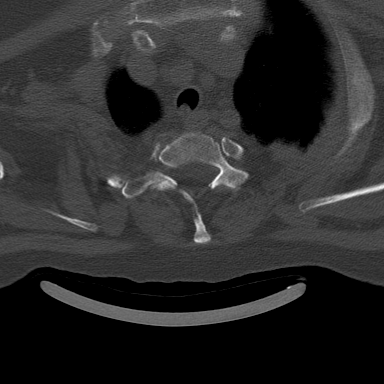
[im 49/97  bone]
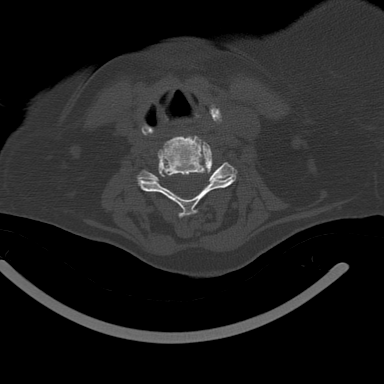
[im 73/97  bone]
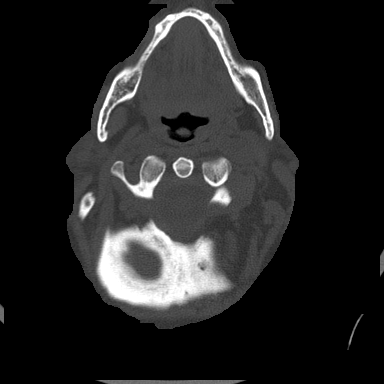

[13 of 33 positions shown; findings below may reference images not displayed]

FINDINGS: CT HEAD FINDINGS

Brain: Cerebral volume is stable and within normal limits. No
midline shift, ventriculomegaly, mass effect, evidence of mass
lesion, intracranial hemorrhage or evidence of cortically based
acute infarction. Gray-white matter differentiation is within normal
limits throughout the brain.

Vascular: Calcified atherosclerosis at the skull base.

Skull: Hyperostosis of the calvarium, normal variant. No skull
fracture identified.

Sinuses/Orbits: Visualized paranasal sinuses and mastoids are stable
and well pneumatized.

Other: No scalp hematoma identified. Visualized orbit soft tissues
are within normal limits.

CT CERVICAL SPINE FINDINGS

Alignment: Increase straightening of cervical lordosis since 4314.
Cervicothoracic junction alignment is within normal limits.
Bilateral posterior element alignment is within normal limits.

Skull base and vertebrae: Visualized skull base is intact. No
atlanto-occipital dissociation. No cervical spine fracture.

Soft tissues and spinal canal: No prevertebral fluid or swelling. No
visible canal hematoma.

Negative noncontrast neck soft tissues.

Disc levels: Chronic disc and endplate degeneration from C3-C4 to
C6-C7. Disc space loss and progressive in [place] irregularity since
4314. Up to mild associated degenerative cervical spinal stenosis
such as at C4-C5.

Upper chest: Visible upper thoracic levels appear intact. Calcified
aortic atherosclerosis. No superior mediastinal lymphadenopathy.
Mild dependent pulmonary atelectasis. Layering left pleural effusion
up to 18 mm thick in the left lung apex.
IMPRESSION: 1. No acute intracranial abnormality. Negative non contrast CT
appearance of the brain, stable since [DATE]. No acute fracture or listhesis identified in the cervical spine.
Widespread chronic cervical disc and endplate degeneration with up
to mild associated degenerative cervical spinal stenosis.
3. Layering left pleural effusion, suspect at least moderate in
size. Recommend follow-up PA and lateral chest radiographs which can
be compared to 10/04/2016.
